# Patient Record
Sex: Female | Born: 1945 | Race: White | Hispanic: No | Marital: Married | Smoking: Former smoker
Health system: Southern US, Community
[De-identification: ages and names within clinical notes are randomized; demographics above are authoritative.]

## PROBLEM LIST (undated history)

## (undated) DIAGNOSIS — I959 Hypotension, unspecified: Secondary | ICD-10-CM

## (undated) DIAGNOSIS — I519 Heart disease, unspecified: Secondary | ICD-10-CM

## (undated) DIAGNOSIS — Z87891 Personal history of nicotine dependence: Secondary | ICD-10-CM

## (undated) DIAGNOSIS — E0590 Thyrotoxicosis, unspecified without thyrotoxic crisis or storm: Secondary | ICD-10-CM

## (undated) DIAGNOSIS — E785 Hyperlipidemia, unspecified: Secondary | ICD-10-CM

## (undated) DIAGNOSIS — M25559 Pain in unspecified hip: Secondary | ICD-10-CM

## (undated) DIAGNOSIS — R55 Syncope and collapse: Secondary | ICD-10-CM

## (undated) DIAGNOSIS — M1990 Unspecified osteoarthritis, unspecified site: Secondary | ICD-10-CM

## (undated) DIAGNOSIS — Z972 Presence of dental prosthetic device (complete) (partial): Secondary | ICD-10-CM

## (undated) DIAGNOSIS — I499 Cardiac arrhythmia, unspecified: Secondary | ICD-10-CM

## (undated) DIAGNOSIS — R002 Palpitations: Secondary | ICD-10-CM

## (undated) DIAGNOSIS — M961 Postlaminectomy syndrome, not elsewhere classified: Secondary | ICD-10-CM

## (undated) DIAGNOSIS — R0600 Dyspnea, unspecified: Secondary | ICD-10-CM

## (undated) DIAGNOSIS — M48062 Spinal stenosis, lumbar region with neurogenic claudication: Secondary | ICD-10-CM

## (undated) DIAGNOSIS — J449 Chronic obstructive pulmonary disease, unspecified: Secondary | ICD-10-CM

## (undated) DIAGNOSIS — C801 Malignant (primary) neoplasm, unspecified: Secondary | ICD-10-CM

## (undated) DIAGNOSIS — M549 Dorsalgia, unspecified: Secondary | ICD-10-CM

## (undated) DIAGNOSIS — G2581 Restless legs syndrome: Secondary | ICD-10-CM

## (undated) DIAGNOSIS — K219 Gastro-esophageal reflux disease without esophagitis: Secondary | ICD-10-CM

## (undated) HISTORY — DX: Hyperlipidemia, unspecified: E78.5

## (undated) HISTORY — DX: Palpitations: R00.2

## (undated) HISTORY — DX: Heart disease, unspecified: I51.9

## (undated) HISTORY — PX: BACK SURGERY: SHX140

## (undated) HISTORY — DX: Syncope and collapse: R55

## (undated) HISTORY — PX: OTHER SURGICAL HISTORY: SHX169

## (undated) HISTORY — PX: ESOPHAGEAL DILATION: SHX303

## (undated) HISTORY — PX: BLADDER SUSPENSION: SHX72

---

## 1999-08-22 ENCOUNTER — Other Ambulatory Visit: Admission: RE | Admit: 1999-08-22 | Discharge: 1999-08-22 | Payer: Self-pay | Admitting: Family Medicine

## 2000-08-25 ENCOUNTER — Other Ambulatory Visit: Admission: RE | Admit: 2000-08-25 | Discharge: 2000-08-25 | Payer: Self-pay | Admitting: Family Medicine

## 2001-09-16 ENCOUNTER — Other Ambulatory Visit: Admission: RE | Admit: 2001-09-16 | Discharge: 2001-09-16 | Payer: Self-pay | Admitting: Family Medicine

## 2003-05-15 ENCOUNTER — Other Ambulatory Visit: Admission: RE | Admit: 2003-05-15 | Discharge: 2003-05-15 | Payer: Self-pay | Admitting: Family Medicine

## 2004-09-18 ENCOUNTER — Ambulatory Visit: Payer: Self-pay | Admitting: Family Medicine

## 2004-11-13 ENCOUNTER — Ambulatory Visit: Payer: Self-pay | Admitting: Family Medicine

## 2004-12-03 ENCOUNTER — Ambulatory Visit: Payer: Self-pay | Admitting: Family Medicine

## 2004-12-18 ENCOUNTER — Ambulatory Visit: Payer: Self-pay | Admitting: Family Medicine

## 2005-01-07 ENCOUNTER — Ambulatory Visit: Payer: Self-pay | Admitting: Family Medicine

## 2005-01-21 ENCOUNTER — Ambulatory Visit: Payer: Self-pay | Admitting: Family Medicine

## 2005-02-26 ENCOUNTER — Ambulatory Visit: Payer: Self-pay | Admitting: Family Medicine

## 2005-03-06 ENCOUNTER — Ambulatory Visit: Payer: Self-pay | Admitting: Family Medicine

## 2005-07-15 ENCOUNTER — Ambulatory Visit: Payer: Self-pay | Admitting: Family Medicine

## 2005-08-08 ENCOUNTER — Ambulatory Visit: Payer: Self-pay | Admitting: Family Medicine

## 2005-08-13 ENCOUNTER — Ambulatory Visit: Payer: Self-pay | Admitting: Family Medicine

## 2005-09-17 ENCOUNTER — Ambulatory Visit: Payer: Self-pay | Admitting: Family Medicine

## 2005-09-22 ENCOUNTER — Ambulatory Visit: Payer: Self-pay | Admitting: Family Medicine

## 2005-09-22 ENCOUNTER — Other Ambulatory Visit: Admission: RE | Admit: 2005-09-22 | Discharge: 2005-09-22 | Payer: Self-pay | Admitting: Family Medicine

## 2005-10-01 ENCOUNTER — Ambulatory Visit: Payer: Self-pay | Admitting: Family Medicine

## 2005-10-30 ENCOUNTER — Ambulatory Visit: Payer: Self-pay | Admitting: Family Medicine

## 2006-01-20 ENCOUNTER — Ambulatory Visit: Payer: Self-pay | Admitting: Family Medicine

## 2009-01-12 ENCOUNTER — Encounter: Admission: RE | Admit: 2009-01-12 | Discharge: 2009-01-12 | Payer: Self-pay | Admitting: Family Medicine

## 2012-12-07 ENCOUNTER — Emergency Department (HOSPITAL_COMMUNITY): Payer: Medicare Other

## 2012-12-07 ENCOUNTER — Encounter (HOSPITAL_COMMUNITY): Payer: Self-pay | Admitting: *Deleted

## 2012-12-07 ENCOUNTER — Observation Stay (HOSPITAL_COMMUNITY)
Admission: EM | Admit: 2012-12-07 | Discharge: 2012-12-08 | Disposition: A | Discharge status: Home / Self Care | Payer: Medicare Other | Attending: Internal Medicine | Admitting: Internal Medicine

## 2012-12-07 DIAGNOSIS — R2689 Other abnormalities of gait and mobility: Secondary | ICD-10-CM | POA: Diagnosis present

## 2012-12-07 DIAGNOSIS — R4701 Aphasia: Principal | ICD-10-CM | POA: Insufficient documentation

## 2012-12-07 DIAGNOSIS — R269 Unspecified abnormalities of gait and mobility: Secondary | ICD-10-CM | POA: Insufficient documentation

## 2012-12-07 DIAGNOSIS — G8929 Other chronic pain: Secondary | ICD-10-CM | POA: Diagnosis present

## 2012-12-07 DIAGNOSIS — R471 Dysarthria and anarthria: Secondary | ICD-10-CM

## 2012-12-07 DIAGNOSIS — R42 Dizziness and giddiness: Secondary | ICD-10-CM | POA: Insufficient documentation

## 2012-12-07 DIAGNOSIS — R930 Abnormal findings on diagnostic imaging of skull and head, not elsewhere classified: Secondary | ICD-10-CM | POA: Insufficient documentation

## 2012-12-07 DIAGNOSIS — G459 Transient cerebral ischemic attack, unspecified: Secondary | ICD-10-CM

## 2012-12-07 DIAGNOSIS — K219 Gastro-esophageal reflux disease without esophagitis: Secondary | ICD-10-CM | POA: Diagnosis present

## 2012-12-07 DIAGNOSIS — G894 Chronic pain syndrome: Secondary | ICD-10-CM | POA: Insufficient documentation

## 2012-12-07 DIAGNOSIS — G2581 Restless legs syndrome: Secondary | ICD-10-CM | POA: Diagnosis present

## 2012-12-07 DIAGNOSIS — E785 Hyperlipidemia, unspecified: Secondary | ICD-10-CM | POA: Insufficient documentation

## 2012-12-07 DIAGNOSIS — M129 Arthropathy, unspecified: Secondary | ICD-10-CM | POA: Insufficient documentation

## 2012-12-07 DIAGNOSIS — E039 Hypothyroidism, unspecified: Secondary | ICD-10-CM | POA: Diagnosis present

## 2012-12-07 HISTORY — DX: Gastro-esophageal reflux disease without esophagitis: K21.9

## 2012-12-07 HISTORY — DX: Pain in unspecified hip: M25.559

## 2012-12-07 HISTORY — DX: Unspecified osteoarthritis, unspecified site: M19.90

## 2012-12-07 HISTORY — DX: Thyrotoxicosis, unspecified without thyrotoxic crisis or storm: E05.90

## 2012-12-07 HISTORY — DX: Restless legs syndrome: G25.81

## 2012-12-07 HISTORY — DX: Dorsalgia, unspecified: M54.9

## 2012-12-07 LAB — COMPREHENSIVE METABOLIC PANEL
ALT: 17 U/L (ref 0–35)
AST: 27 U/L (ref 0–37)
Albumin: 3.7 g/dL (ref 3.5–5.2)
BUN: 23 mg/dL (ref 6–23)
Calcium: 9.3 mg/dL (ref 8.4–10.5)
Creatinine, Ser: 0.57 mg/dL (ref 0.50–1.10)
GFR calc non Af Amer: >90 mL/min (ref >90)
Glucose, Bld: 108 mg/dL — ABNORMAL HIGH (ref 70–99)
Potassium: 4.2 mEq/L (ref 3.5–5.1)
Total Bilirubin: 0.2 mg/dL — ABNORMAL LOW (ref 0.3–1.2)

## 2012-12-07 LAB — POCT I-STAT, CHEM 8
BUN: 24 mg/dL — ABNORMAL HIGH (ref 6–23)
Calcium, Ion: 1.21 mmol/L (ref 1.13–1.30)
Creatinine, Ser: 0.8 mg/dL (ref 0.50–1.10)
Glucose, Bld: 96 mg/dL (ref 70–99)
TCO2: 28 mmol/L (ref 0–100)

## 2012-12-07 LAB — CBC
Hemoglobin: 13.3 g/dL (ref 12.0–15.0)
MCH: 31.7 pg (ref 26.0–34.0)
MCV: 91.2 fL (ref 78.0–100.0)
RBC: 4.19 MIL/uL (ref 3.87–5.11)

## 2012-12-07 LAB — DIFFERENTIAL
Eosinophils Absolute: 0.1 10*3/uL (ref 0.0–0.7)
Eosinophils Relative: 2 % (ref 0–5)
Lymphs Abs: 1.2 10*3/uL (ref 0.7–4.0)
Monocytes Relative: 14 % — ABNORMAL HIGH (ref 3–12)
Neutrophils Relative %: 58 % (ref 43–77)

## 2012-12-07 LAB — HEMOGLOBIN A1C: Hgb A1c MFr Bld: 5.9 % — ABNORMAL HIGH (ref <5.7)

## 2012-12-07 MED ORDER — CYCLOBENZAPRINE HCL 10 MG PO TABS: 10.0000 mg | ORAL_TABLET | Freq: Two times a day (BID) | ORAL | Status: DC | PRN

## 2012-12-07 MED ORDER — GABAPENTIN 100 MG PO CAPS
200.0000 mg | ORAL_CAPSULE | Freq: Three times a day (TID) | ORAL | Status: DC
  Administered 2012-12-07: 200 mg via ORAL
  Filled 2012-12-07 (×2): qty 3

## 2012-12-07 MED ORDER — ENOXAPARIN SODIUM 40 MG/0.4ML ~~LOC~~ SOLN
40.0000 mg | SUBCUTANEOUS | Status: DC
  Administered 2012-12-07: 40 mg via SUBCUTANEOUS
  Filled 2012-12-07 (×2): qty 0.4

## 2012-12-07 MED ORDER — ADULT MULTIVITAMIN W/MINERALS CH
1.0000 | ORAL_TABLET | Freq: Every day | ORAL | Status: DC
  Administered 2012-12-08: 1 via ORAL
  Filled 2012-12-07 (×2): qty 1

## 2012-12-07 MED ORDER — B COMPLEX PO TABS: 2.0000 | ORAL_TABLET | Freq: Every day | ORAL | Status: DC

## 2012-12-07 MED ORDER — GABAPENTIN 100 MG PO CAPS: 100.0000 mg | ORAL_CAPSULE | Freq: Three times a day (TID) | ORAL | Status: DC

## 2012-12-07 MED ORDER — SODIUM CHLORIDE 0.9 % IV SOLN: 250.0000 mL | INTRAVENOUS | Status: DC | PRN

## 2012-12-07 MED ORDER — B COMPLEX-C PO TABS
1.0000 | ORAL_TABLET | Freq: Every day | ORAL | Status: DC
  Administered 2012-12-07: 1 via ORAL
  Filled 2012-12-07 (×3): qty 1

## 2012-12-07 MED ORDER — SODIUM CHLORIDE 0.9 % IJ SOLN
3.0000 mL | Freq: Two times a day (BID) | INTRAMUSCULAR | Status: DC
  Administered 2012-12-07 – 2012-12-08 (×3): 3 mL via INTRAVENOUS

## 2012-12-07 MED ORDER — STUDY - INVESTIGATIONAL DRUG SIMPLE RECORD
600.0000 mg | Status: AC
  Administered 2012-12-07: 600 mg via ORAL
  Filled 2012-12-07: qty 600

## 2012-12-07 MED ORDER — VITAMIN D3 25 MCG (1000 UNIT) PO TABS
1000.0000 [IU] | ORAL_TABLET | Freq: Every day | ORAL | Status: DC
  Administered 2012-12-08: 1000 [IU] via ORAL
  Filled 2012-12-07 (×2): qty 1

## 2012-12-07 MED ORDER — STUDY - INVESTIGATIONAL DRUG SIMPLE RECORD
75.0000 mg | Freq: Every day | Status: DC
  Filled 2012-12-07 (×2): qty 75

## 2012-12-07 MED ORDER — SODIUM CHLORIDE 0.9 % IJ SOLN: 3.0000 mL | INTRAMUSCULAR | Status: DC | PRN

## 2012-12-07 MED ORDER — MELATONIN 3 MG PO CAPS: 3.0000 mg | ORAL_CAPSULE | Freq: Every day | ORAL | Status: DC

## 2012-12-07 MED ORDER — LEVOTHYROXINE SODIUM 50 MCG PO TABS
50.0000 ug | ORAL_TABLET | Freq: Every day | ORAL | Status: DC
  Administered 2012-12-08: 50 ug via ORAL
  Filled 2012-12-07: qty 1

## 2012-12-07 MED ORDER — PANTOPRAZOLE SODIUM 40 MG PO TBEC
80.0000 mg | DELAYED_RELEASE_TABLET | Freq: Every day | ORAL | Status: DC
  Administered 2012-12-08: 80 mg via ORAL
  Filled 2012-12-07: qty 2

## 2012-12-07 MED ORDER — ROPINIROLE HCL 1 MG PO TABS
3.0000 mg | ORAL_TABLET | Freq: Two times a day (BID) | ORAL | Status: DC
  Administered 2012-12-07 – 2012-12-08 (×2): 3 mg via ORAL
  Filled 2012-12-07 (×3): qty 3

## 2012-12-07 MED ORDER — NABUMETONE 500 MG PO TABS
500.0000 mg | ORAL_TABLET | Freq: Two times a day (BID) | ORAL | Status: DC
  Administered 2012-12-07 – 2012-12-08 (×2): 500 mg via ORAL
  Filled 2012-12-07 (×3): qty 1

## 2012-12-07 MED ORDER — ALPHA-LIPOIC ACID 100 MG PO CAPS: 1.0000 | ORAL_CAPSULE | Freq: Every day | ORAL | Status: DC

## 2012-12-07 MED ORDER — ASPIRIN 325 MG PO TABS
325.0000 mg | ORAL_TABLET | Freq: Every day | ORAL | Status: DC
  Administered 2012-12-07: 325 mg via ORAL
  Filled 2012-12-07 (×2): qty 1

## 2012-12-07 MED ORDER — TRAMADOL HCL 50 MG PO TABS
50.0000 mg | ORAL_TABLET | Freq: Every day | ORAL | Status: DC
  Administered 2012-12-07 – 2012-12-08 (×5): 50 mg via ORAL
  Filled 2012-12-07 (×8): qty 1

## 2012-12-07 MED ORDER — HYDROCODONE-ACETAMINOPHEN 5-325 MG PO TABS
1.0000 | ORAL_TABLET | Freq: Four times a day (QID) | ORAL | Status: DC | PRN
  Administered 2012-12-07 (×2): 1 via ORAL
  Filled 2012-12-07 (×2): qty 1

## 2012-12-07 MED ORDER — COQ-10 100 MG PO CAPS: 100.0000 mg | ORAL_CAPSULE | Freq: Every evening | ORAL | Status: DC

## 2012-12-07 MED ORDER — GABAPENTIN 100 MG PO CAPS
100.0000 mg | ORAL_CAPSULE | Freq: Three times a day (TID) | ORAL | Status: DC
  Administered 2012-12-08: 100 mg via ORAL
  Filled 2012-12-07 (×3): qty 1

## 2012-12-07 MED ORDER — FLUTICASONE PROPIONATE 50 MCG/ACT NA SUSP
2.0000 | Freq: Every day | NASAL | Status: DC
  Administered 2012-12-07 – 2012-12-08 (×2): 2 via NASAL
  Filled 2012-12-07: qty 16

## 2012-12-07 MED ORDER — TEMAZEPAM 15 MG PO CAPS
30.0000 mg | ORAL_CAPSULE | Freq: Every day | ORAL | Status: DC
  Administered 2012-12-07: 30 mg via ORAL
  Filled 2012-12-07: qty 2

## 2012-12-07 MED ORDER — BETHANECHOL CHLORIDE 25 MG PO TABS
25.0000 mg | ORAL_TABLET | Freq: Three times a day (TID) | ORAL | Status: DC
  Administered 2012-12-07 – 2012-12-08 (×3): 25 mg via ORAL
  Filled 2012-12-07 (×5): qty 1

## 2012-12-07 NOTE — ED Provider Notes (Signed)
History     CSN: [id number]  Arrival date & time 12/07/12  0827   First MD Initiated Contact with Patient 12/07/12 [phone]      Chief Complaint  Patient presents with  . Aphasia    (Consider location/radiation/quality/duration/timing/severity/associated sxs/prior treatment) HPI Level 5 caveat due to urgent need for intervention Pt brought to the ED via EMS as a code stroke. She reports she woke up this morning feeling dizzy which is not unusual for her. Husband states that she was not slurring her speech at that time, but over the next 20-30 minutes began to have trouble speaking. EMS reports bilateral weak hand grips, but states symptoms improved considerably en route.   Past Medical History  Diagnosis Date  . Hip pain   . Back pain   . GERD (gastroesophageal reflux disease)   . Hyperthyroidism   . Arthritis   . Restless leg syndrome     No past surgical history on file.  No family history on file.  History  Substance Use Topics  . Smoking status: Not on file  . Smokeless tobacco: Not on file  . Alcohol Use: Not on file    OB History   Grav Para Term Preterm Abortions TAB SAB Ect Mult Living                  Review of Systems All other systems reviewed and are negative except as noted in HPI.   Allergies  Review of patient's allergies indicates no known allergies.  Home Medications  No current outpatient prescriptions on file.  BP 104/72  Pulse 90  Temp(Src) 97.7 F (36.5 C) (Oral)  Resp 14  SpO2 98%  Physical Exam  Nursing note and vitals reviewed. Constitutional: She is oriented to person, place, and time. She appears well-developed and well-nourished.  HENT:  Head: Normocephalic and atraumatic.  Eyes: EOM are normal. Pupils are equal, round, and reactive to light.  Neck: Normal range of motion. Neck supple.  Cardiovascular: Normal rate, normal heart sounds and intact distal pulses.   Pulmonary/Chest: Effort normal and breath sounds normal.   Abdominal: Bowel sounds are normal. She exhibits no distension. There is no tenderness.  Musculoskeletal: Normal range of motion. She exhibits no edema and no tenderness.  Neurological: She is alert and oriented to person, place, and time. She has normal strength. No cranial nerve deficit or sensory deficit.  Mild dysarthria. For full NIHSS, please see Code Stroke team notes  Skin: Skin is warm and dry. No rash noted.  Psychiatric: She has a normal mood and affect.    ED Course  Procedures (including critical care time)  Labs Reviewed  DIFFERENTIAL - Abnormal; Notable for the following:    Monocytes Relative 14 (*)    All other components within normal limits  COMPREHENSIVE METABOLIC PANEL - Abnormal; Notable for the following:    Glucose, Bld 108 (*)    Total Bilirubin 0.2 (*)    All other components within normal limits  POCT I-STAT, CHEM 8 - Abnormal; Notable for the following:    BUN 24 (*)    All other components within normal limits  PROTIME-INR  APTT  CBC  TROPONIN I   Ct Head Wo Contrast  12/07/2012  *RADIOLOGY REPORT*  Clinical Data: Code stroke  CT HEAD WITHOUT CONTRAST  Technique:  Contiguous axial images were obtained from the base of the skull through the vertex without contrast.  Comparison: None.  Findings: No skull fracture is noted.  Paranasal  sinuses and mastoid air cells are unremarkable.  No intracranial hemorrhage, mass effect or midline shift.  No acute infarction.  No mass lesion is noted on this unenhanced scan. No hydrocephalus.  IMPRESSION: No acute intracranial abnormality.  No definite acute infarction.   Original Report Authenticated By: Natasha Mead, M.D.      No diagnosis found.    MDM   Date: 12/07/2012  Rate: 90  Rhythm: normal sinus rhythm  QRS Axis: normal  Intervals: normal  ST/T Wave abnormalities: normal  Conduction Disutrbances: none  Narrative Interpretation: unremarkable  Pt evaluated by Dr. [name] with Neurology who does not feel  she is a tPA candidate due to low NIHSS and resolving symptoms. Will admit for TIA evaluation.           Charles B. Bernette Mayers, MD 12/07/12 [id number]

## 2012-12-07 NOTE — H&P (Signed)
Hospital Admission Note Date: 12/07/2012  Patient name: [name] Medical record number: [id number] Date of birth: October 24, 1946 Age: 67 y.o. Gender: female PCP: Dina Rich, MD  Medical Service: Internal medicine teaching service  Attending physician: Dr. Josem Kaufmann    1st Contact: Collier Bullock    Pager: [phone] 2nd Contact: Manson Passey    Pager: [phone] After 5 pm or weekends: 1st Contact:      Pager: [phone] 2nd Contact:      Pager: [phone]  Chief Complaint: Weakness and slurred speech.  History of Present Illness: Patient is a pleasant 26 year woman with past history of GERD, restless leg syndrome, arthritis who was brought to the ED for code stroke. She was seen normal last at 6:50 AM. Around that time she started feeling wobbly and weak when walking. She had to sit down and had trouble doing that too. Her husband noticed slurred speech along with expressive aphasia during this episode. He called 911- who came in noticed weakness also. While en route to ED, her weakness got better. She denies any residual weakness, sensation changes, headache, palpitations, vision changes now. She also denies nausea, vomiting, abdominal pain, chest pain, shortness of breath, diarrhea.  She does not have a previous history of CVA, diabetes, hypertension, hyper lipidemia.  Meds: Current Outpatient Rx  Name  Route  Sig  Dispense  Refill  . Alpha-Lipoic Acid 100 MG CAPS   Oral   Take 1 capsule by mouth daily.         Marland Kitchen aspirin EC 81 MG tablet   Oral   Take 81 mg by mouth every evening.         Marland Kitchen b complex vitamins tablet   Oral   Take 2 tablets by mouth daily.         . bethanechol (URECHOLINE) 25 MG tablet   Oral   Take 25 mg by mouth 3 (three) times daily.         . cholecalciferol (VITAMIN D) 1000 UNITS tablet   Oral   Take 1,000 Units by mouth daily.         . Coenzyme Q10 (COQ-10) 100 MG CAPS   Oral   Take 100 mg by mouth every evening.         . cyclobenzaprine (FLEXERIL) 10 MG  tablet   Oral   Take 10 mg by mouth 2 (two) times daily as needed for muscle spasms (for muscle spasams).         . fluticasone (FLONASE) 50 MCG/ACT nasal spray   Nasal   Place 2 sprays into the nose daily.         Marland Kitchen gabapentin (NEURONTIN) 100 MG capsule   Oral   Take 200-300 mg by mouth 3 (three) times daily. 200 mg every morning and at noon, and 300 mg every evening         . HYDROcodone-acetaminophen (NORCO/VICODIN) 5-325 MG per tablet   Oral   Take 1 tablet by mouth every 6 (six) hours as needed for pain (for pain).         Marland Kitchen levothyroxine (SYNTHROID, LEVOTHROID) 50 MCG tablet   Oral   Take 50 mcg by mouth daily.         . Melatonin 3 MG CAPS   Oral   Take 3 mg by mouth at bedtime.         . Multiple Vitamin (MULTIVITAMIN WITH MINERALS) TABS   Oral   Take 1 tablet by mouth daily.         Marland Kitchen  nabumetone (RELAFEN) 500 MG tablet   Oral   Take 500 mg by mouth 2 (two) times daily.         Marland Kitchen omeprazole (PRILOSEC) 40 MG capsule   Oral   Take 40 mg by mouth every evening.         Marland Kitchen rOPINIRole (REQUIP) 1 MG tablet   Oral   Take 3 mg by mouth 2 (two) times daily.         . temazepam (RESTORIL) 30 MG capsule   Oral   Take 30 mg by mouth at bedtime.         . traMADol (ULTRAM) 50 MG tablet   Oral   Take 50 mg by mouth 5 (five) times daily.           Allergies: Allergies as of 12/07/2012  . (No Known Allergies)   Past Medical History  Diagnosis Date  . Hip pain   . Back pain   . GERD (gastroesophageal reflux disease)   . Hyperthyroidism   . Arthritis   . Restless leg syndrome    History reviewed. No pertinent past surgical history. No family history on file. History   Social History  . Marital Status: Married    Spouse Name: N/A    Number of Children: N/A  . Years of Education: N/A   Occupational History  . Not on file.   Social History Main Topics  . Smoking status: Former Smoker    Types: Cigarettes  . Smokeless tobacco:  Not on file     Comment: Quit about 20 years before.  . Alcohol Use: No  . Drug Use: No  . Sexually Active: Not on file   Other Topics Concern  . Not on file   Social History Narrative   Patient lives with her husband.    She is an adopted kid and so is unaware of her family history.   She has 2 children- one has asthma and other has arthritis.          Review of Systems: As per history of present illness, all other systems reviewed and negative.  Physical Exam: Blood pressure 105/65, pulse 90, temperature 98.2 F (36.8 C), temperature source Oral, resp. rate 18, SpO2 100.00%. Constitutional: Vital signs reviewed.  Patient is a well-developed and well-nourished in no acute distress and cooperative with exam. Alert and oriented x3.  Head: Normocephalic and atraumatic Mouth: no erythema or exudates, MMM Eyes: PERRL, EOMI, conjunctivae normal, No scleral icterus.  Neck: Supple, Trachea midline normal ROM, No JVD Cardiovascular: RRR, S1 normal, S2 normal, no MRG, pulses symmetric and intact bilaterally Pulmonary/Chest: CTAB, no wheezes, rales, or rhonchi Abdominal: Soft. Non-tender, non-distended, bowel sounds are normal, no masses, organomegaly, or guarding present.  Musculoskeletal: No joint deformities, erythema, or stiffness, ROM full and no nontender Neurological: A&O x3, Strength is normal and symmetric bilaterally, cranial nerve II-XII are grossly intact, no focal motor deficit, sensory intact to light touch bilaterally.  Skin: Warm, dry and intact. No rash, cyanosis, or clubbing.  Psychiatric: Normal mood and affect. speech and behavior is normal. Judgment and thought content normal. Cognition and memory are normal.     Lab results: Basic Metabolic Panel:  Recent Labs  40/98/11 0841 12/07/12 0848  NA 139 137  K 4.3 4.2  CL 103 101  CO2  --  28  GLUCOSE 96 108*  BUN 24* 23  CREATININE 0.80 0.57  CALCIUM  --  9.3   Liver Function Tests:  Recent Labs   12/07/12 0848  AST 27  ALT 17  ALKPHOS 54  BILITOT 0.2*  PROT 6.8  ALBUMIN 3.7   CBC:  Recent Labs  12/07/12 0841 12/07/12 0848  WBC  --  4.6  NEUTROABS  --  2.7  HGB 13.9 13.3  HCT 41.0 38.2  MCV  --  91.2  PLT  --  266   Cardiac Enzymes:  Recent Labs  12/07/12 0848  TROPONINI <0.30   Coagulation:  Recent Labs  12/07/12 0848  LABPROT 12.4  INR 0.93    Imaging results:  Ct Head Wo Contrast  12/07/2012  *RADIOLOGY REPORT*  Clinical Data: Code stroke  CT HEAD WITHOUT CONTRAST  Technique:  Contiguous axial images were obtained from the base of the skull through the vertex without contrast.  Comparison: None.  Findings: No skull fracture is noted.  Paranasal sinuses and mastoid air cells are unremarkable.  No intracranial hemorrhage, mass effect or midline shift.  No acute infarction.  No mass lesion is noted on this unenhanced scan. No hydrocephalus.  IMPRESSION: No acute intracranial abnormality.  No definite acute infarction.   Original Report Authenticated By: Natasha Mead, M.D.     Other results: EKG: Normal sinus rhythm without any ST, T-wave abnormalities concerning for ischemia/infarction.  Assessment & Plan by Problem: Principal Problem:   TIA (transient ischemic attack) Active Problems:   Hypothyroidism   Restless leg syndrome   GERD (gastroesophageal reflux disease)   Chronic pain  1) TIA: Patient with symptoms of CVA with quick resolution consistent with TIA. He did negative for acute stroke/bleed. Apparently patient does not have classic risk factors for CVA including diabetes, hypertension hyperlipidemia, cigarette smoking. She stopped smoking about 20 years before.  The only recent change was about her Neurontin.  CT head negative for acute changes. Code stroke was called and neuro consultation was provided by Dr. [name] in ED. No rhythm abnormalities on EKG or telemetry so far. No previous history of arrhythmias.  - Admit to observation on  telemetry for TIA workup. - MRI/MRA head without contrast, 2-D echo and carotid Dopplers.  - Risk stratification - HbA1c and fasting lipid panel. - Aspirin 325 mg daily and then can change to 81 milligram- patient was already on baby aspirin daily. - PT/OT/SLP consult.   2) Restless leg syndrome : Continue home medication including Requip and Relafen.  3) Chronic pain : Likely from arthritis. Continue home Vicodin and tramadol.  4) DVT prophylaxis: Lovenox.  Dispo: Disposition is deferred at this time, awaiting improvement of current medical problems. Anticipated discharge in approximately 1-2 day(s).   The patient does have a current PCP (DOUGH,ROBERT, MD), therefore will not be requiring OPC follow-up after discharge.   The patient does not have transportation limitations that hinder transportation to clinic appointments.  Signed: Sorin Frimpong 12/07/2012, 11:02 AM

## 2012-12-07 NOTE — ED Notes (Signed)
Pt returned from MRI °

## 2012-12-07 NOTE — Progress Notes (Signed)
  Echocardiogram 2D Echocardiogram has been performed.  Jorje Guild 12/07/2012, 4:27 PM

## 2012-12-07 NOTE — Progress Notes (Signed)
PHARMACIST - PHYSICIAN ORDER COMMUNICATION  CONCERNING: P&T Medication Policy on Herbal Medications  DESCRIPTION:  This patient's order for:  Alpha-Lipoic Acid, Melatonin, Co-enzyme Q10  has been noted.  This product(s) is classified as an "herbal" or natural product. Due to a lack of definitive safety studies or FDA approval, nonstandard manufacturing practices, plus the potential risk of unknown drug-drug interactions while on inpatient medications, the Pharmacy and Therapeutics Committee does not permit the use of "herbal" or natural products of this type within [facility].   ACTION TAKEN: The pharmacy department is unable to verify this order at this time and your patient has been informed of this safety policy. Please reevaluate patient's clinical condition at discharge and address if the herbal or natural product(s) should be resumed at that time.  Thank you, Harland German, Pharm D 12/07/2012 2:32 PM

## 2012-12-07 NOTE — Research (Signed)
Patient was admitted to hospital for possible TIA. During evaluation, patient was identified as a possible POINT trial candidate. A POINT trial informed consent was given to patient to read and ask questions. Patient requested to contact PCP, Dr. Sol Passer, for trial approval. Dr. Sol Passer was contacted and spoke to patient via telephone. After reading the informed consent and speaking with Dr. Sol Passer, patient agreed to participate. No study related activities were performed prior to the consenting process. Signatures were obtained on the informed consent and a copy was given to patient for personal record. Patient met the inclusion/exclusion criteria and randomized to kit# [id number]. Patient was given 8 tabs of study drug at 13:40.

## 2012-12-07 NOTE — Consult Note (Signed)
Referring Physician: Dr. Bernette Mayers    Chief Complaint: Slurred speech and dizziness as well as weakness.  HPI: [name] is an 67 y.o. female history of arthritis, back pain, restless leg syndrome and GERD, presenting with slurred speech and dizziness as well as a feeling of weakness bilaterally. Patient was last known normal at 6:50 AM today. Has no previous history of stroke nor TIA. She's been taking aspirin daily. CT scan of her head showed no acute intracranial abnormality. Dysarthria improved after arriving in the emergency room. Weakness resolved. Dizziness improved. NIH stroke score initially was 3 and subsequently was 1 for minimal residual dysarthria.  LSN:  6:50 AM today tPA Given: No: Rapidly resolving deficits MRankin: 0  Past Medical History  Diagnosis Date  . Hip pain   . Back pain   . GERD (gastroesophageal reflux disease)   . Hyperthyroidism   . Arthritis   . Restless leg syndrome     No family history on file.   Medications: Prior to Admission:  (Not in a hospital admission)   Physical Examination: Blood pressure 127/60, pulse 90, temperature 98.2 F (36.8 C), temperature source Oral, resp. rate 20, SpO2 100.00%.  Neurologic Examination: Mental Status: Alert, oriented, thought content appropriate.  Speech fluent without evidence of aphasia. Able to follow commands without difficulty. Cranial Nerves: II-Visual fields were normal. III/IV/VI-Pupils were equal and reacted. Extraocular movements were full and conjugate.    V/VII-no facial numbness and no facial weakness. VIII-normal. X-minimal slurring of speech. XII-midline tongue extension Motor: 5/5 bilaterally with normal tone and bulk Sensory: Normal throughout. Deep Tendon Reflexes: 2+ and symmetric. Plantars: Flexor bilaterally Cerebellar: Normal finger-to-nose testing. Carotid auscultation: Normal  Ct Head Wo Contrast  12/07/2012  *RADIOLOGY REPORT*  Clinical Data: Code stroke  CT HEAD WITHOUT  CONTRAST  Technique:  Contiguous axial images were obtained from the base of the skull through the vertex without contrast.  Comparison: None.  Findings: No skull fracture is noted.  Paranasal sinuses and mastoid air cells are unremarkable.  No intracranial hemorrhage, mass effect or midline shift.  No acute infarction.  No mass lesion is noted on this unenhanced scan. No hydrocephalus.  IMPRESSION: No acute intracranial abnormality.  No definite acute infarction.   Original Report Authenticated By: Natasha Mead, M.D.     Assessment: TIA or possibly small vessel ischemic infarction, as etiology for patient's slurred speech and dizziness, cannot be ruled out.   Stroke Risk Factors - none  Plan: 1. HgbA1c, fasting lipid panel 2. MRI, MRA  of the brain without contrast 3. PT consult, OT consult, Speech consult 4. Echocardiogram 5. Carotid dopplers 6. Prophylactic therapy-Antiplatelet med: Aspirin 81 mg per day 7. Risk factor modification 8. Telemetry monitoring   C.R. [name], MD [facility] [phone]   12/07/2012, 10:04 AM

## 2012-12-07 NOTE — ED Notes (Signed)
Pts husband was leaving for work this am and noticed that she had slurred speech and bilateral upper extremity weakness.  Last seen normal 2200 on 2/10.  Pt arrives by [employer].  Alert and oriented on arrival

## 2012-12-07 NOTE — Progress Notes (Signed)
Utilization review completed.  P.J. Lynnette Pote,RN,BSN Case Manager [phone]  

## 2012-12-07 NOTE — ED Notes (Signed)
Initial NIHSS was done by Hella RN and was 3 (pt missed questions on age and month).  Repeat NIHSS was 1, pt was alert and oriented, one point for disarthria

## 2012-12-08 DIAGNOSIS — R2689 Other abnormalities of gait and mobility: Secondary | ICD-10-CM | POA: Diagnosis present

## 2012-12-08 DIAGNOSIS — R269 Unspecified abnormalities of gait and mobility: Secondary | ICD-10-CM

## 2012-12-08 DIAGNOSIS — G2581 Restless legs syndrome: Secondary | ICD-10-CM

## 2012-12-08 LAB — CBC
MCHC: 34.6 g/dL (ref 30.0–36.0)
Platelets: 280 10*3/uL (ref 150–400)
RDW: 13.4 % (ref 11.5–15.5)

## 2012-12-08 LAB — BASIC METABOLIC PANEL
BUN: 14 mg/dL (ref 6–23)
Calcium: 9 mg/dL (ref 8.4–10.5)
Creatinine, Ser: 0.51 mg/dL (ref 0.50–1.10)
GFR calc Af Amer: >90 mL/min (ref >90)
GFR calc non Af Amer: >90 mL/min (ref >90)

## 2012-12-08 LAB — LIPID PANEL
Cholesterol: 274 mg/dL — ABNORMAL HIGH (ref 0–200)
LDL Cholesterol: 183 mg/dL — ABNORMAL HIGH (ref 0–99)
Triglycerides: 200 mg/dL — ABNORMAL HIGH (ref <150)

## 2012-12-08 LAB — RAPID URINE DRUG SCREEN, HOSP PERFORMED
Amphetamines: NOT DETECTED
Barbiturates: NOT DETECTED
Benzodiazepines: NOT DETECTED

## 2012-12-08 MED ORDER — GABAPENTIN 100 MG PO CAPS: 100.0000 mg | ORAL_CAPSULE | Freq: Three times a day (TID) | ORAL | Status: DC

## 2012-12-08 MED ORDER — SIMVASTATIN 20 MG PO TABS
20.0000 mg | ORAL_TABLET | Freq: Every day | ORAL | Status: DC
  Filled 2012-12-08: qty 1

## 2012-12-08 MED ORDER — SIMVASTATIN 20 MG PO TABS: 20.0000 mg | ORAL_TABLET | Freq: Every day | ORAL | Status: DC

## 2012-12-08 MED ORDER — ASPIRIN 325 MG PO TABS: 325.0000 mg | ORAL_TABLET | Freq: Every day | ORAL | Status: DC

## 2012-12-08 NOTE — Progress Notes (Signed)
PT Cancellation Note  Patient Details Name: [name] MRN: [id number] DOB: 03-04-1946   Cancelled Treatment:    Reason Eval/Treat Not Completed:  Spoke with OT; pt does not require skilled PT at this time. Pt completed stair negotiation, transfers, and ambulation safely and independently. Pt with good support at home. PT eval not completed due to not needed. Please re-consult if skilled PT services needed in future.   Marcene Brawn 12/08/2012, 11:08 AM  Lewis Shock, PT, DPT Pager #: [phone] Office #: [phone]

## 2012-12-08 NOTE — Progress Notes (Signed)
Stroke Team Progress Note  HISTORY [name] is an 67 y.o. female history of arthritis, back pain, restless leg syndrome and GERD, presenting with slurred speech and dizziness as well as a feeling of weakness bilaterally. Patient was last known normal at 6:50 AM today 12/07/2012. Has no previous history of stroke nor TIA. She's been taking aspirin daily. CT scan of her head showed no acute intracranial abnormality. Dysarthria improved after arriving in the emergency room. Weakness resolved. Dizziness improved. NIH stroke score initially was 3 and subsequently was 1 for minimal residual dysarthria. Patient was not a TPA candidate secondary to rapidly resolving symptoms. She was admitted for further evaluation and treatment.  SUBJECTIVE Her husband is at the bedside. She does not have any memory of her episode yesterday. Overall she feels her condition is rapidly improving. He reports her staggering, both to right and left yesterday. She got real sleepy and went out. Recent increase in gabapentin 2-2-3 two weeks ago. Did not mix up dose. Did not take 3 yesterday am. No other changes in medication for the past 2 weeks. She is sure. She had no facial droop. She did have slurred speech. No loss of bladder/bowel function.  OBJECTIVE Most recent Vital Signs: Filed Vitals:   12/07/12 1819 12/07/12 2147 12/08/12 0200 12/08/12 0600  BP: 113/70 125/68 108/50 101/51  Pulse: 88 94 84 84  Temp: 97.5 F (36.4 C) 97.9 F (36.6 C) 97.6 F (36.4 C) 97.6 F (36.4 C)  TempSrc: Oral     Resp: 18 16 16 16   Height:      Weight:      SpO2: 100% 100% 100% 99%   CBG (last 3)  No results found for this basename: GLUCAP,  in the last 72 hours  IV Fluid Intake:     MEDICATIONS  . aspirin  325 mg Oral Daily  . B-complex with vitamin C  1 tablet Oral Daily  . bethanechol  25 mg Oral TID  . cholecalciferol  1,000 Units Oral Daily  . enoxaparin (LOVENOX) injection  40 mg Subcutaneous Q24H  . fluticasone  2 spray  Each Nare Daily  . gabapentin  100 mg Oral TID  . levothyroxine  50 mcg Oral Daily  . multivitamin with minerals  1 tablet Oral Daily  . nabumetone  500 mg Oral BID  . pantoprazole  80 mg Oral Daily  . rOPINIRole  3 mg Oral BID  . sodium chloride  3 mL Intravenous Q12H  . temazepam  30 mg Oral QHS  . traMADol  50 mg Oral 5 X Daily   PRN:  sodium chloride, cyclobenzaprine, HYDROcodone-acetaminophen, sodium chloride  Diet:  General thin liquids Activity:   Bathroom privileges with assistance DVT Prophylaxis:  Lovenox 40 mg sq daily   CLINICALLY SIGNIFICANT STUDIES Basic Metabolic Panel:  Recent Labs Lab 12/07/12 0848 12/08/12 0650  NA 137 134*  K 4.2 3.8  CL 101 98  CO2 28 26  GLUCOSE 108* 92  BUN 23 14  CREATININE 0.57 0.51  CALCIUM 9.3 9.0   Liver Function Tests:  Recent Labs Lab 12/07/12 0848  AST 27  ALT 17  ALKPHOS 54  BILITOT 0.2*  PROT 6.8  ALBUMIN 3.7   CBC:  Recent Labs Lab 12/07/12 0841 12/07/12 0848 12/08/12 0650  WBC  --  4.6 5.0  NEUTROABS  --  2.7  --   HGB 13.9 13.3 12.6  HCT 41.0 38.2 36.4  MCV  --  91.2 89.7  PLT  --  266 280   Coagulation:  Recent Labs Lab 12/07/12 0848  LABPROT 12.4  INR 0.93   Cardiac Enzymes:  Recent Labs Lab 12/07/12 0848  TROPONINI <0.30   Urinalysis: No results found for this basename: COLORURINE, APPERANCEUR, LABSPEC, PHURINE, GLUCOSEU, HGBUR, BILIRUBINUR, KETONESUR, PROTEINUR, UROBILINOGEN, NITRITE, LEUKOCYTESUR,  in the last 168 hours Lipid Panel    Component Value Date/Time   CHOL 274* 12/08/2012 0650   TRIG 200* 12/08/2012 0650   HDL 51 12/08/2012 0650   CHOLHDL 5.4 12/08/2012 0650   VLDL 40 12/08/2012 0650   LDLCALC 183* 12/08/2012 0650   HgbA1C  Lab Results  Component Value Date   HGBA1C 5.9* 12/07/2012    Urine Drug Screen:   No results found for this basename: labopia, cocainscrnur, labbenz, amphetmu, thcu, labbarb    Alcohol Level: No results found for this basename: ETH,  in the last 168  hours  CT of the brain  12/07/2012  : No acute intracranial abnormality.  No definite acute infarction.     MRI of the brain  12/07/2012   1. No acute intracranial abnormality. 2.  Mild for age nonspecific cerebral white matter signal changes.  MRA of the brain  12/07/2012   Negative for age intracranial MRA.  2D Echocardiogram    Carotid Doppler   Preliminary report: Bilateral: No evidence of hemodynamically significant internal carotid artery stenosis. Vertebral artery flow is antegrade.    CXR    EKG  normal sinus rhythm.   Therapy Recommendations   Physical Exam   The patient is alert and cooperative at the time of examination. Neck is supple, no carotid bruits are noted Cardiovascular examination reveals a regular rate and rhythm, no obvious murmurs or rubs are noted. Extremities are without significant edema.  Neurologic examination: Extraocular movements are full. Visual fields are full. Speech is normal, no aphasia or dysarthria is noted. Pinprick sensation on the face is symmetric and normal. Motor examination reveals full strength of all 4 extremities. Good symmetric motor tone is noted throughout. Sensation is intact to pinprick, soft touch, and vibration sensation throughout. No evidence of extinction is noted. Cerebellar testing reveals good finger-nose-finger and heel-to-shin bilaterally. Gait was not tested. Deep tendon reflexes are symmetric and normal. Toes are downgoing bilaterally.   ASSESSMENT [name] is a 67 y.o. female presenting with slurred speech and dizziness as well as a feeling of weakness bilaterally. She has no memory of the event. Imaging confirms no acute  infarct. Dx: sounds like a medication overdose based on physilcal sx history - though pt denies any change in medication regimen. Doubt TIA as memory loss not typically associated with TIA/stroke, but will complete workup . On aspirin 81 mg orally every day prior to admission. Now on aspirin  325 mg orally every day for secondary stroke prevention. Patient with no resultant neuor symptoms this am. Work up underway.  Patient was enrollled in POINT Trial - POINT is a randomized, double-blind, multicenter clinical trial to determine whether clopidogrel 75mg /day (after a loading dose of 600mg ) is effective in improving survival free from major ischemic vascular events (ischemic stroke, myocardial infarction, and ischemic vascular death) at 90 days when initiated within 12 hours time last known free of new ischemic symptoms of TIA or minor ischemic stroke in subjects receiving aspirin 50-325mg /day. Please contact Lorenza Burton at [facility] at [phone] for any questions. - she was taken on POINT study drug this am as POINT study drug contraindicated in the  setting of requip. Patient not willing to stop requip at this time. RLS on requip No history of migraine. Mild headache past few weeks. Hyperlipidemia, LDL 186, not on statin PTA (has been taken off statin in the past - she is not sure why), not on statin now, goal LDL < 100 HgbA1c 5.9 Hip and back pain GERD Hyperthyroidism Arthritis  Hospital day # 1  The patient gives a history that could be consistent with a medication overdose. The patient was dizzy, and she was amnestic for the event. The patient was noted to be ataxic, with dysarthria. The patient however, clearly indicates that she has had no alteration in her medication dosing for over 2 weeks. The patient will undergo EEG evaluation to exclude seizures. Stroke workup is ongoing. MRI is notable for no acute changes. Some mild chronic cerebrovascular disease is noted.   TREATMENT/PLAN Recommendations  Continue aspirin 325 mg orally every day for secondary stroke prevention.  F/u 2D  check EEG  Add statin  Dr. Anne Hahn discussed with Dr. Carleene Mains, MSN, RN, ANVP-BC, ANP-BC, GNP-BC [facility] Pager: [phone] 12/08/2012  9:39 AM  I have personally obtained a history, examined the patient, evaluated imaging results, and formulated the assessment and plan of care. I agree with the above.   Lesly Dukes

## 2012-12-08 NOTE — Progress Notes (Signed)
Subjective:  On further history, patient states that she has been taking gabapentin 100mg  tabs for the past month with recently increased dose. Patient took her medicines around 6am the day of admission, less than an hour before patient was found to have altered mental status. Patient admits she had been symptomatic with dizziness from the gabapentin previously.  Patient does not remember anything from yesterday morning until after she had been in the ED.  She has been back to baseline mental status without any residual neurologic deficits since admission.  Denies weakness, numbness, slurred speech, dizziness.  Spoke with husband who voiced concern about patient's medications and whether she was taking the correct doses. She keeps her pills in a pill box and sometimes doesn't check how many or which meds are in the container before taking. Worried about potential medication side effects.  Objective: Vital signs in last 24 hours: Filed Vitals:   12/07/12 1819 12/07/12 2147 12/08/12 0200 12/08/12 0600  BP: 113/70 125/68 108/50 101/51  Pulse: 88 94 84 84  Temp: 97.5 F (36.4 C) 97.9 F (36.6 C) 97.6 F (36.4 C) 97.6 F (36.4 C)  TempSrc: Oral     Resp: 18 16 16 16   Height:      Weight:      SpO2: 100% 100% 100% 99%   Weight change:   Intake/Output Summary (Last 24 hours) at 12/08/12 0831 Last data filed at 12/07/12 2130  Gross per 24 hour  Intake    120 ml  Output      0 ml  Net    120 ml    Physical Exam Blood pressure 101/51, pulse 84, temperature 97.6 F (36.4 C), temperature source Oral, resp. rate 16, height 5' (1.524 m), weight 111 lb 15.9 oz (50.8 kg), SpO2 99.00%. General:  No acute distress, alert and oriented x 3, well-appearing  HEENT:  PERRL, EOMI, moist mucous membranes Cardiovascular:  Regular rate and rhythm, no murmurs, rubs or gallops Respiratory:  Clear to auscultation bilaterally, no wheezes, rales, or rhonchi Abdomen:  Soft, nondistended, nontender,  bowel sounds present Extremities:  Warm and well-perfused, no edema.  Skin: Warm, dry, no rashes Neuro: Cranial nerves intact, strength 5/5 throughout, sensation intact, symmetric reflexes.  Lab Results: Basic Metabolic Panel:  Recent Labs Lab 12/07/12 0848 12/08/12 0650  NA 137 134*  K 4.2 3.8  CL 101 98  CO2 28 26  GLUCOSE 108* 92  BUN 23 14  CREATININE 0.57 0.51  CALCIUM 9.3 9.0   Liver Function Tests:  Recent Labs Lab 12/07/12 0848  AST 27  ALT 17  ALKPHOS 54  BILITOT 0.2*  PROT 6.8  ALBUMIN 3.7   CBC:  Recent Labs Lab 12/07/12 0841 12/07/12 0848 12/08/12 0650  WBC  --  4.6 5.0  NEUTROABS  --  2.7  --   HGB 13.9 13.3 12.6  HCT 41.0 38.2 36.4  MCV  --  91.2 89.7  PLT  --  266 280   Cardiac Enzymes:  Recent Labs Lab 12/07/12 0848  TROPONINI <0.30   Hemoglobin A1C:  Recent Labs Lab 12/07/12 1617  HGBA1C 5.9*   Fasting Lipid Panel:  Recent Labs Lab 12/08/12 0650  CHOL 274*  HDL 51  LDLCALC 183*  TRIG 200*  CHOLHDL 5.4   Coagulation:  Recent Labs Lab 12/07/12 0848  LABPROT 12.4  INR 0.93    Studies/Results: Ct Head Wo Contrast  12/07/2012  *RADIOLOGY REPORT*  Clinical Data: Code stroke  CT HEAD WITHOUT CONTRAST  Technique:  Contiguous axial images were obtained from the base of the skull through the vertex without contrast.  Comparison: None.  Findings: No skull fracture is noted.  Paranasal sinuses and mastoid air cells are unremarkable.  No intracranial hemorrhage, mass effect or midline shift.  No acute infarction.  No mass lesion is noted on this unenhanced scan. No hydrocephalus.  IMPRESSION: No acute intracranial abnormality.  No definite acute infarction.   Original Report Authenticated By: Natasha Mead, M.D.    Mr Angiogram Head Wo Contrast  12/07/2012  *RADIOLOGY REPORT*  Clinical Data:  67 year old female with headache and expressive aphasia. Transient dizziness.  Comparison: Head CT without contrast the same day.  MRI HEAD  WITHOUT CONTRAST  Technique: Multiplanar, multiecho pulse sequences of the brain and surrounding structures were obtained according to standard protocol without intravenous contrast.  Findings: Cerebral volume is within normal limits for age.  No restricted diffusion to suggest acute infarction.  No midline shift, mass effect, evidence of mass lesion, ventriculomegaly, extra-axial collection or acute intracranial hemorrhage. Cervicomedullary junction and pituitary are within normal limits. Major intracranial vascular flow voids are preserved, dominant distal left vertebral artery.  Scattered periventricular and subcortical white matter T2 and FLAIR hyperintensity.  This is most pronounced in the right periatrial region.  Extent is overall mild for age.  No cortical encephalomalacia.  Deep gray matter nuclei, brainstem, cerebellum, and visualized cervical spine are within normal limits.  Visualized bone marrow signal is within normal limits.  Visualized orbit soft tissues are within normal limits.  Visualized paranasal sinuses and mastoids are clear.  Grossly normal visualized internal auditory structures.  Negative scalp soft tissues.  IMPRESSION: 1. No acute intracranial abnormality. 2.  Mild for age nonspecific cerebral white matter signal changes. 3.  Intracranial MRA findings are below.  MRA HEAD WITHOUT CONTRAST  Technique: Angiographic images of the Circle of Willis were obtained using MRA technique without  intravenous contrast.  Findings: Antegrade flow in the posterior circulation.  Dominant distal left vertebral artery.  Diminutive right vertebral artery functionally terminates in PICA.  Normal left PICA.  Mild tortuosity of the basilar artery without stenosis.  Right AICA origins are normal.  Small posterior communicating arteries.  SCA and PCA origins are within normal limits.  Bilateral PCA branches are within normal limits.  Antegrade flow in both ICA siphons.  Tortuous distal cervical right ICA.  Mild  ICA siphon irregularity without stenosis.  Ophthalmic and posterior communicating artery origins are within normal limits.  Normal carotid termini, MCA and ACA origins.  Mildly dominant right ACA A1 segment.  Normal anterior communicating artery.  Visualized bilateral MCA and ACA branches are within normal limits.  IMPRESSION: Negative for age intracranial MRA.   Original Report Authenticated By: Erskine Speed, M.D.    Mr Brain Wo Contrast  12/07/2012  *RADIOLOGY REPORT*  Clinical Data:  67 year old female with headache and expressive aphasia. Transient dizziness.  Comparison: Head CT without contrast the same day.  MRI HEAD WITHOUT CONTRAST  Technique: Multiplanar, multiecho pulse sequences of the brain and surrounding structures were obtained according to standard protocol without intravenous contrast.  Findings: Cerebral volume is within normal limits for age.  No restricted diffusion to suggest acute infarction.  No midline shift, mass effect, evidence of mass lesion, ventriculomegaly, extra-axial collection or acute intracranial hemorrhage. Cervicomedullary junction and pituitary are within normal limits. Major intracranial vascular flow voids are preserved, dominant distal left vertebral artery.  Scattered periventricular and subcortical white matter T2 and  FLAIR hyperintensity.  This is most pronounced in the right periatrial region.  Extent is overall mild for age.  No cortical encephalomalacia.  Deep gray matter nuclei, brainstem, cerebellum, and visualized cervical spine are within normal limits.  Visualized bone marrow signal is within normal limits.  Visualized orbit soft tissues are within normal limits.  Visualized paranasal sinuses and mastoids are clear.  Grossly normal visualized internal auditory structures.  Negative scalp soft tissues.  IMPRESSION: 1. No acute intracranial abnormality. 2.  Mild for age nonspecific cerebral white matter signal changes. 3.  Intracranial MRA findings are below.   MRA HEAD WITHOUT CONTRAST  Technique: Angiographic images of the Circle of Willis were obtained using MRA technique without  intravenous contrast.  Findings: Antegrade flow in the posterior circulation.  Dominant distal left vertebral artery.  Diminutive right vertebral artery functionally terminates in PICA.  Normal left PICA.  Mild tortuosity of the basilar artery without stenosis.  Right AICA origins are normal.  Small posterior communicating arteries.  SCA and PCA origins are within normal limits.  Bilateral PCA branches are within normal limits.  Antegrade flow in both ICA siphons.  Tortuous distal cervical right ICA.  Mild ICA siphon irregularity without stenosis.  Ophthalmic and posterior communicating artery origins are within normal limits.  Normal carotid termini, MCA and ACA origins.  Mildly dominant right ACA A1 segment.  Normal anterior communicating artery.  Visualized bilateral MCA and ACA branches are within normal limits.  IMPRESSION: Negative for age intracranial MRA.   Original Report Authenticated By: Erskine Speed, M.D.    Medications:  Medications reviewed  Scheduled Meds: . aspirin  325 mg Oral Daily  . B-complex with vitamin C  1 tablet Oral Daily  . bethanechol  25 mg Oral TID  . cholecalciferol  1,000 Units Oral Daily  . enoxaparin (LOVENOX) injection  40 mg Subcutaneous Q24H  . fluticasone  2 spray Each Nare Daily  . gabapentin  100 mg Oral TID  . levothyroxine  50 mcg Oral Daily  . multivitamin with minerals  1 tablet Oral Daily  . nabumetone  500 mg Oral BID  . pantoprazole  80 mg Oral Daily  . Point Trial - clopidogrel / placebo (daily dose)  (PI-Sethi)  75 mg Oral Q breakfast  . rOPINIRole  3 mg Oral BID  . sodium chloride  3 mL Intravenous Q12H  . temazepam  30 mg Oral QHS  . traMADol  50 mg Oral 5 X Daily   Continuous Infusions:  PRN Meds:.sodium chloride, cyclobenzaprine, HYDROcodone-acetaminophen, sodium chloride  Assessment/Plan:  TIA vs Gabapentin  Overdose: Patient with symptoms of CVA with quick resolution consistent with TIA. Also consider gabapentin overdose/side effect with recent increased dose and timing of altered mental status fits with medication dosing.  Head CT negative for acute stroke/hemorrhage. Patient does not have classic risk factors for CVA including diabetes, hypertension hyperlipidemia, cigarette smoking. She stopped smoking about 20 years ago.  Code stroke was called and neuro consultation was provided by Dr. [name] in ED.  No rhythm abnormalities on EKG or telemetry so far. No previous history of arrhythmias.  *MRI/MRA negative, carotid dopplers with anterograde flow, no significant narrowing noted* A1c 5.9  -appreciate neuro recommendations -UDS, EEG -ECHO still pending - decrease gabapentin dose to 100mg /100mg /200mg  (considered discontinuing this medication, however, she may benefit if we do a slower escalation) -FLP with LDL 183, will dc with statin today  Restless leg syndrome : Continue home medication including Requip and Relafen.  Chronic pain syndrome : Continue home Vicodin and tramadol.    DVT prophylaxis: Lovenox.   Dispo:  DC home today The patient does have a current PCP (DOUGH,ROBERT, MD), therefore will not be requiring OPC follow-up after discharge.     LOS: 1 day   [place] Ar 12/08/2012, 8:31 AM

## 2012-12-08 NOTE — Discharge Instructions (Signed)
Transient Ischemic Attack A transient ischemic attack (TIA) is a "warning stroke" that causes stroke-like symptoms. Unlike a stroke, a TIA does not cause permanent damage to the brain. The symptoms of a TIA can happen very fast and do not last long. It is important to know the symptoms of a TIA and what to do. This can help prevent a major stroke or death. CAUSES   A TIA is caused by a temporary blockage in an artery in the brain or neck (carotid artery). The blockage does not allow the brain to get the blood supply it needs and can cause different symptoms. The blockage can be caused by either:  A blood clot.  Fatty buildup (plaque) in a neck or brain artery. SYMPTOMS  TIA symptoms are the same as a stroke but are temporary. Symptoms can include sudden:  Numbness or weakness on one side of the body. Especially to the:  Face.  Arm.  Leg.  Trouble speaking, thinking, or confusion.  Change in vision, such as trouble seeing in one or both eyes.  Dizziness, loss of balance, or difficulty walking.  Severe headache. ANY OF THESE SYMPTOMS MAY REPRESENT A SERIOUS PROBLEM THAT IS AN EMERGENCY. Do not wait to see if the symptoms will go away. Get medical help at once. Call your local emergency services (911 in [place] IMMEDIATELY. DO NOT drive yourself to the hospital. RISK FACTORS Risk factors can increase the risk of developing a TIA. These can include.   High blood pressure (hypertension).  High cholesterol (hyperlipidemia).  Heart disease (atherosclerosis).  Smoking.  Diabetes.  Abnormal heart rhythm (atrial fibrillation).  Family history of a stroke or heart attack.  Use of oral contraceptives (especially when combined with smoking). DIAGNOSIS   A TIA can be diagnosed based on your:  Symptoms.  History.  Risk factors.  Tests that can help diagnose the symptoms of a TIA include:  CT or MRI scan. These tests can provide detailed images of the brain.  Carotid  ultrasound. This test looks to see if there are blockages in the carotid arteries of your neck.  Arteriography. A thin, small flexible tube (catheter) is inserted through a small cut (incision) in your groin. The catheter is threaded to your carotid or vertebral artery. A dye is then injected into the catheter. The dye highlights the arteries in your brain and allows your caregiver to look for narrowing or blockages that can cause a TIA. TREATMENT  Based on the cause of a TIA, treatment options can vary. Treatment is important to help prevent a stroke. Treatment options can include:  Medication. Such as:  Clot-busting medicine.  Anti-platelet medicine.  Blood pressure medicine.  Blood thinner medicine.  Surgery:  Carotid endarterectomy. The carotid arteries are the arteries that supply the head and neck with oxygenated blood. This surgery can help remove fatty deposits (plaque) in the carotid arteries.  Angioplasty and stenting. This surgery uses a balloon to dilate a blocked artery in the brain. A stent is a small, metal mesh tube that can help keep an artery open HOME CARE INSTRUCTIONS   It is important to take all medicine as told by your caregiver. If the medicine has side effects that affect you negatively, tell your caregiver right away. Do not stop taking medicine unless told by your caregiver. Some medicines may need to be changed to better treat your condition.  Do not smoke. Talk to your caregiver on how to quit smoking.  Eat a diet high in fruits,  vegetables and lean meat. Avoid a high fat, high salt diet. A dietician can you help you make healthy food choices.  Maintain a healthy weight. Develop an exercise plan approved by your caregiver. SEEK IMMEDIATE MEDICAL CARE IF:   You develop weakness or numbness on one side of your body.  You have problems thinking, speaking, or feel confused.  You have vision changes.  You feel dizzy, have trouble walking, or lose your  balance.  You develop a severe headache. MAKE SURE YOU:   Understand these instructions.  Will watch your condition.  Will get help right away if you are not doing well or get worse. Document Released: 07/23/2005 Document Revised: 01/05/2012 Document Reviewed: 12/06/2009 [facility] Patient Information 2013 [place], [place]. STROKE/TIA DISCHARGE INSTRUCTIONS SMOKING Cigarette smoking nearly doubles your risk of having a stroke & is the single most alterable risk factor  If you smoke or have smoked in the last 12 months, you are advised to quit smoking for your health.  Most of the excess cardiovascular risk related to smoking disappears within a year of stopping.  Ask you doctor about anti-smoking medications  [place] Quit Line: 1-800-QUIT NOW  Free Smoking Cessation Classes [phone]  CHOLESTEROL Know your levels; limit fat & cholesterol in your diet  Lipid Panel     Component Value Date/Time   CHOL 274* 12/08/2012 0650   TRIG 200* 12/08/2012 0650   HDL 51 12/08/2012 0650   CHOLHDL 5.4 12/08/2012 0650   VLDL 40 12/08/2012 0650   LDLCALC 183* 12/08/2012 0650      Many patients benefit from treatment even if their cholesterol is at goal.  Goal: Total Cholesterol (CHOL) less than 160  Goal:  Triglycerides (TRIG) less than 150  Goal:  HDL greater than 40  Goal:  LDL (LDLCALC) less than 100   BLOOD PRESSURE American Stroke Association blood pressure target is less that 120/80 mm/Hg  Your discharge blood pressure is:  BP: 129/54 mmHg  Monitor your blood pressure  Limit your salt and alcohol intake  Many individuals will require more than one medication for high blood pressure  DIABETES (A1c is a blood sugar average for last 3 months) Goal HGBA1c is under 7% (HBGA1c is blood sugar average for last 3 months)  Diabetes: {STROKE DC DIABETES:22357}    Lab Results  Component Value Date   HGBA1C 5.9* 12/07/2012     Your HGBA1c can be lowered with medications, healthy diet, and  exercise.  Check your blood sugar as directed by your physician  Call your physician if you experience unexplained or low blood sugars.  PHYSICAL ACTIVITY/REHABILITATION Goal is 30 minutes at least 4 days per week    {STROKE DC ACTIVITY/REHAB:22359}  Activity decreases your risk of heart attack and stroke and makes your heart stronger.  It helps control your weight and blood pressure; helps you relax and can improve your mood.  Participate in a regular exercise program.  Talk with your doctor about the best form of exercise for you (dancing, walking, swimming, cycling).  DIET/WEIGHT Goal is to maintain a healthy weight  Your discharge diet is: General *** liquids Your height is:  Height: 5' (152.4 cm) Your current weight is: Weight: 50.8 kg (111 lb 15.9 oz) Your Body Mass Index (BMI) is:  BMI (Calculated): 21.9  Following the type of diet specifically designed for you will help prevent another stroke.  Your goal weight range is:  ***  Your goal Body Mass Index (BMI) is 19-24.  Healthy  food habits can help reduce 3 risk factors for stroke:  High cholesterol, hypertension, and excess weight.  RESOURCES Stroke/Support Group:  Call [phone]  they meet the 3rd Sunday of the month on the Rehab Unit at [facility], [place] ( no meetings June, July & Aug).  STROKE EDUCATION PROVIDED/REVIEWED AND GIVEN TO PATIENT Stroke warning signs and symptoms How to activate emergency medical system (call 911). Medications prescribed at discharge. Need for follow-up after discharge. Personal risk factors for stroke. Pneumonia vaccine given:   {STROKE DC YES/NO/DATE:22363} Flu vaccine given:   {STROKE DC YES/NO/DATE:22363} My questions have been answered, the writing is legible, and I understand these instructions.  I will adhere to these goals & educational materials that have been provided to me after my discharge from the hospital.

## 2012-12-08 NOTE — Evaluation (Signed)
Occupational Therapy Evaluation and Discharge Summary Patient Details Name: [name] MRN: [id number] DOB: 03-28-1946 Today's Date: 12/08/2012 Time: 4098-1191 OT Time Calculation (min): 20 min  OT Assessment / Plan / Recommendation Clinical Impression  Pt was admitted for TIA and now has returned to baseline.  No Ot needs at this time.    OT Assessment  Patient does not need any further OT services    Follow Up Recommendations  No OT follow up    Barriers to Discharge      Equipment Recommendations  None recommended by OT    Recommendations for Other Services    Frequency       Precautions / Restrictions Precautions Precautions: None Restrictions Weight Bearing Restrictions: No   Pertinent Vitals/Pain Pt with mild pain in R lower leg.    ADL  Eating/Feeding: Performed;Independent Where Assessed - Eating/Feeding: Chair Grooming: Performed;Independent Where Assessed - Grooming: Unsupported standing Upper Body Bathing: Simulated;Independent Where Assessed - Upper Body Bathing: Unsupported sit to stand Lower Body Bathing: Performed;Independent Where Assessed - Lower Body Bathing: Unsupported sit to stand Upper Body Dressing: Performed;Independent Where Assessed - Upper Body Dressing: Unsupported sitting Lower Body Dressing: Performed;Independent Where Assessed - Lower Body Dressing: Unsupported sit to stand Toilet Transfer: Performed;Independent Toilet Transfer Method: Sit to Barista: Comfort height toilet Toileting - Clothing Manipulation and Hygiene: Performed;Independent Where Assessed - Toileting Clothing Manipulation and Hygiene: Standing Tub/Shower Transfer: Performed;Independent Tub/Shower Transfer Method: Stand pivot Transfers/Ambulation Related to ADLs: independent ADL Comments: independent    OT Diagnosis:    OT Problem List:   OT Treatment Interventions:     OT Goals    Visit Information  Last OT Received On:  12/08/12 Assistance Needed: +1    Subjective Data  Subjective: "I am ready to go home." Patient Stated Goal: go home   Prior Functioning     Home Living Lives With: Spouse Available Help at Discharge: Family Type of Home: House Home Access: Stairs to enter Secretary/administrator of Steps: 4 Home Layout: One level Firefighter: Standard Home Adaptive Equipment: None Prior Function Level of Independence: Independent Able to Take Stairs?: Yes Driving: Yes Vocation: Retired Musician: No difficulties Dominant Hand: Right         Vision/Perception Vision - History Baseline Vision: No visual deficits   Cognition  Cognition Overall Cognitive Status: Appears within functional limits for tasks assessed/performed Arousal/Alertness: Awake/alert Orientation Level: Oriented X4 / Intact Behavior During Session: WFL for tasks performed Cognition - Other Comments: intact    Extremity/Trunk Assessment Right Upper Extremity Assessment RUE ROM/Strength/Tone: Within functional levels RUE Sensation: WFL - Light Touch RUE Coordination: WFL - gross/fine motor Left Upper Extremity Assessment LUE ROM/Strength/Tone: Within functional levels LUE Sensation: WFL - Light Touch LUE Coordination: WFL - gross/fine motor Trunk Assessment Trunk Assessment: Normal     Mobility Bed Mobility Details for Bed Mobility Assistance: independent Transfers Transfers: Sit to Stand;Stand to Sit Sit to Stand: 7: Independent Stand to Sit: 7: Independent Details for Transfer Assistance: stairs Ily.     Exercise     Balance Balance Balance Assessed: Yes   End of Session OT - End of Session Activity Tolerance: Patient tolerated treatment well Patient left: in chair;Other (comment) (leaving for dopplers) Nurse Communication: Mobility status  GO Functional Assessment Tool Used: clinical judgement Functional Limitation: Self care Self Care Current Status (Y7829): 0 percent  impaired, limited or restricted Self Care Goal Status (F6213): 0 percent impaired, limited or restricted Self Care Discharge Status (  Z6109): 0 percent impaired, limited or restricted   Hope Budds 12/08/2012, 9:00 AM [phone]

## 2012-12-08 NOTE — H&P (Signed)
Internal Medicine Attending Admission Note Date: 12/08/2012  Patient name: [name] Medical record number: [id number] Date of birth: 03-29-1946 Age: 67 y.o. Gender: female  I saw and evaluated the patient. I reviewed the resident's note and I agree with the resident's findings and plan as documented in the resident's note.  Chief Complaint(s): Slurred speech, unsteady gait, amnesia.  History - key components related to admission:  [name] is a 67 year old woman with a history of chronic low back pain, gastroesophageal reflux disease, restless leg syndrome, degenerative joint disease, and hypothyroidism who presents to the emergency department with slurred speech, unsteady gait, and amnesia. [name] was in her usual state of health on the evening prior to admission when she went to bed feeling fine. When she awoke she mentioned to her husband that she was dizzy. Several minutes later he saw her in the kitchen and she was wobbly and to the left and the right. Her speech was slurred but he did not notice any facial droop. She then sat down and slumped over. He called EMS and she was brought to the emergency department. Apparently, her symptoms improved on the ambulance ride over. She was seen by neurology for a possible stroke. Carotid Dopplers, CT of the head, an MRI/MRA were unremarkable. The initial neurologist felt she may have had a TIA. With more history it was found that she had recently increased her gabapentin dose 2 weeks ago. At that point she was feeling dizzy on it and she was instructed to decrease the dose which he had been doing. She does not remember anything from the morning of admission until well into her emergency department course. She currently feels fine without any weakness, numbness, uncorrordination, headaches, fevers, chills, nausea, vomiting, diarrhea, chest pain, palpitations, or shortness of breath.  She has no history of diabetes, hypertension, or family history of  strokes. She was just recently told she has hyperlipidemia and smoked for a few years in her youth. She is without any other acute complaints.  Physical Exam - key components related to admission:  Filed Vitals:   12/08/12 0200 12/08/12 0600 12/08/12 0830 12/08/12 1020  BP: 108/50 101/51 108/47 129/54  Pulse: 84 84 86 88  Temp: 97.6 F (36.4 C) 97.6 F (36.4 C) 97.7 F (36.5 C) 97.6 F (36.4 C)  TempSrc:    Oral  Resp: 16 16 18 18   Height:      Weight:      SpO2: 100% 99% 100% 100%   General: Well-developed, well-nourished, woman lying comfortably in bed in no acute distress. Lungs: Clear to auscultation bilaterally without wheezes, rhonchi, or rales. Heart: Regular rate and rhythm without murmurs, rubs, or gallops. Abdomen: Soft, nontender, active bowel sounds. Extremities: Without edema. Neuro: Alert and oriented x 3. Cranial nerve II through XII intact. Strength 5/5 throughout in all muscle groups. Sensation to light touch intact throughout. Deep tendon reflexes 2+ throughout.  Lab results:  Basic Metabolic Panel:  Recent Labs  40/98/11 0848 12/08/12 0650  NA 137 134*  K 4.2 3.8  CL 101 98  CO2 28 26  GLUCOSE 108* 92  BUN 23 14  CREATININE 0.57 0.51  CALCIUM 9.3 9.0   Liver Function Tests:  Recent Labs  12/07/12 0848  AST 27  ALT 17  ALKPHOS 54  BILITOT 0.2*  PROT 6.8  ALBUMIN 3.7   CBC:  Recent Labs  12/07/12 0841 12/07/12 0848 12/08/12 0650  WBC  --  4.6 5.0  NEUTROABS  --  2.7  --   HGB 13.9 13.3 12.6  HCT 41.0 38.2 36.4  MCV  --  91.2 89.7  PLT  --  266 280   Cardiac Enzymes:  Recent Labs  12/07/12 0848  TROPONINI <0.30   Hemoglobin A1C:  Recent Labs  12/07/12 1617  HGBA1C 5.9*   Fasting Lipid Panel:  Recent Labs  12/08/12 0650  CHOL 274*  HDL 51  LDLCALC 183*  TRIG 200*  CHOLHDL 5.4   Coagulation:  Recent Labs  12/07/12 0848  INR 0.93   Urine Drug Screen:  Negative  Imaging results:  Ct Head Wo  Contrast  12/07/2012  *RADIOLOGY REPORT*  Clinical Data: Code stroke  CT HEAD WITHOUT CONTRAST  Technique:  Contiguous axial images were obtained from the base of the skull through the vertex without contrast.  Comparison: None.  Findings: No skull fracture is noted.  Paranasal sinuses and mastoid air cells are unremarkable.  No intracranial hemorrhage, mass effect or midline shift.  No acute infarction.  No mass lesion is noted on this unenhanced scan. No hydrocephalus.  IMPRESSION: No acute intracranial abnormality.  No definite acute infarction.   Original Report Authenticated By: Natasha Mead, M.D.    Mr Angiogram Head Wo Contrast  12/07/2012  *RADIOLOGY REPORT*  Clinical Data:  67 year old female with headache and expressive aphasia. Transient dizziness.  Comparison: Head CT without contrast the same day.  MRI HEAD WITHOUT CONTRAST  Technique: Multiplanar, multiecho pulse sequences of the brain and surrounding structures were obtained according to standard protocol without intravenous contrast.  Findings: Cerebral volume is within normal limits for age.  No restricted diffusion to suggest acute infarction.  No midline shift, mass effect, evidence of mass lesion, ventriculomegaly, extra-axial collection or acute intracranial hemorrhage. Cervicomedullary junction and pituitary are within normal limits. Major intracranial vascular flow voids are preserved, dominant distal left vertebral artery.  Scattered periventricular and subcortical white matter T2 and FLAIR hyperintensity.  This is most pronounced in the right periatrial region.  Extent is overall mild for age.  No cortical encephalomalacia.  Deep gray matter nuclei, brainstem, cerebellum, and visualized cervical spine are within normal limits.  Visualized bone marrow signal is within normal limits.  Visualized orbit soft tissues are within normal limits.  Visualized paranasal sinuses and mastoids are clear.  Grossly normal visualized internal auditory  structures.  Negative scalp soft tissues.  IMPRESSION: 1. No acute intracranial abnormality. 2.  Mild for age nonspecific cerebral white matter signal changes. 3.  Intracranial MRA findings are below.  MRA HEAD WITHOUT CONTRAST  Technique: Angiographic images of the Circle of Willis were obtained using MRA technique without  intravenous contrast.  Findings: Antegrade flow in the posterior circulation.  Dominant distal left vertebral artery.  Diminutive right vertebral artery functionally terminates in PICA.  Normal left PICA.  Mild tortuosity of the basilar artery without stenosis.  Right AICA origins are normal.  Small posterior communicating arteries.  SCA and PCA origins are within normal limits.  Bilateral PCA branches are within normal limits.  Antegrade flow in both ICA siphons.  Tortuous distal cervical right ICA.  Mild ICA siphon irregularity without stenosis.  Ophthalmic and posterior communicating artery origins are within normal limits.  Normal carotid termini, MCA and ACA origins.  Mildly dominant right ACA A1 segment.  Normal anterior communicating artery.  Visualized bilateral MCA and ACA branches are within normal limits.  IMPRESSION: Negative for age intracranial MRA.   Original Report Authenticated By: Erskine Speed, M.D.  Mr Brain Wo Contrast  12/07/2012  *RADIOLOGY REPORT*  Clinical Data:  67 year old female with headache and expressive aphasia. Transient dizziness.  Comparison: Head CT without contrast the same day.  MRI HEAD WITHOUT CONTRAST  Technique: Multiplanar, multiecho pulse sequences of the brain and surrounding structures were obtained according to standard protocol without intravenous contrast.  Findings: Cerebral volume is within normal limits for age.  No restricted diffusion to suggest acute infarction.  No midline shift, mass effect, evidence of mass lesion, ventriculomegaly, extra-axial collection or acute intracranial hemorrhage. Cervicomedullary junction and pituitary are  within normal limits. Major intracranial vascular flow voids are preserved, dominant distal left vertebral artery.  Scattered periventricular and subcortical white matter T2 and FLAIR hyperintensity.  This is most pronounced in the right periatrial region.  Extent is overall mild for age.  No cortical encephalomalacia.  Deep gray matter nuclei, brainstem, cerebellum, and visualized cervical spine are within normal limits.  Visualized bone marrow signal is within normal limits.  Visualized orbit soft tissues are within normal limits.  Visualized paranasal sinuses and mastoids are clear.  Grossly normal visualized internal auditory structures.  Negative scalp soft tissues.  IMPRESSION: 1. No acute intracranial abnormality. 2.  Mild for age nonspecific cerebral white matter signal changes. 3.  Intracranial MRA findings are below.  MRA HEAD WITHOUT CONTRAST  Technique: Angiographic images of the Circle of Willis were obtained using MRA technique without  intravenous contrast.  Findings: Antegrade flow in the posterior circulation.  Dominant distal left vertebral artery.  Diminutive right vertebral artery functionally terminates in PICA.  Normal left PICA.  Mild tortuosity of the basilar artery without stenosis.  Right AICA origins are normal.  Small posterior communicating arteries.  SCA and PCA origins are within normal limits.  Bilateral PCA branches are within normal limits.  Antegrade flow in both ICA siphons.  Tortuous distal cervical right ICA.  Mild ICA siphon irregularity without stenosis.  Ophthalmic and posterior communicating artery origins are within normal limits.  Normal carotid termini, MCA and ACA origins.  Mildly dominant right ACA A1 segment.  Normal anterior communicating artery.  Visualized bilateral MCA and ACA branches are within normal limits.  IMPRESSION: Negative for age intracranial MRA.   Original Report Authenticated By: Erskine Speed, M.D.    Other results:  EKG: Normal sinus rhythm at 90  beats per minute, normal axis and intervals, no significant Q waves or LVH, good R wave progression, no ST-T changes, no comparisons available.  Assessment & Plan by Problem:  Ms. Mateja is a 67 year old woman with a history of chronic low back pain, gastroesophageal reflux disease, restless leg syndrome, degenerative joint disease, and hypothyroidism who presents with the acute onset of slurred speech, unsteady gait, and amnesia. An extensive evaluation failed to demonstrate any evidence of stroke. She may have had a transient ischemic attack without any structural deficits. On the other hand, she also may have accidentally overdosed on a medication such as gabapentin. This theory would explain her somnolence, amnesia, and slurred speech. These symptoms would be very unusual for TIA.  1) Acute neurologic symptoms: Ms. Kitch aspirin was increased to 325 mg per day. Her gabapentin was decreased to 100 mg in the morning 100 mg at noon and 200 mg at night. She is scheduled to see her family physician in one week and it was recommended that they review her medication list and remove any medications that may be ineffective or unnecessary. She will be started on a statin for her  hyperlipidemia.  2) Disposition: I agree with the housestaff's plan to discharge Ms. Schnebly home today with followup in her family medicine physician's office in one week as already scheduled.

## 2012-12-08 NOTE — Discharge Summary (Signed)
Internal Medicine Teaching [facility] Discharge Note  Name: [name] MRN: [id number] DOB: Aug 17, 1946 67 y.o.  Date of Admission: 12/07/2012  8:32 AM Date of Discharge: 12/08/2012 Attending Physician: Rocco Serene, MD  Discharge Diagnosis: Principal Problem:   TIA (transient ischemic attack) Active Problems:   Hypothyroidism   Restless leg syndrome   GERD (gastroesophageal reflux disease)   Chronic pain   Discharge Medications:   Medication List    STOP taking these medications       aspirin EC 81 MG tablet      TAKE these medications       Alpha-Lipoic Acid 100 MG Caps  Take 1 capsule by mouth daily.     aspirin 325 MG tablet  Take 1 tablet (325 mg total) by mouth daily.     b complex vitamins tablet  Take 2 tablets by mouth daily.     bethanechol 25 MG tablet  Commonly known as:  URECHOLINE  Take 25 mg by mouth 3 (three) times daily.     cholecalciferol 1000 UNITS tablet  Commonly known as:  VITAMIN D  Take 1,000 Units by mouth daily.     CoQ-10 100 MG Caps  Take 100 mg by mouth every evening.     cyclobenzaprine 10 MG tablet  Commonly known as:  FLEXERIL  Take 10 mg by mouth 2 (two) times daily as needed for muscle spasms (for muscle spasams).     fluticasone 50 MCG/ACT nasal spray  Commonly known as:  FLONASE  Place 2 sprays into the nose daily.     gabapentin 100 MG capsule  Commonly known as:  NEURONTIN  Take 1 capsule (100 mg total) by mouth 3 (three) times daily. 100 mg every morning and at noon, and 200 mg every evening     HYDROcodone-acetaminophen 5-325 MG per tablet  Commonly known as:  NORCO/VICODIN  Take 1 tablet by mouth every 6 (six) hours as needed for pain (for pain).     levothyroxine 50 MCG tablet  Commonly known as:  SYNTHROID, LEVOTHROID  Take 50 mcg by mouth daily.     Melatonin 3 MG Caps  Take 3 mg by mouth at bedtime.     multivitamin with minerals Tabs  Take 1 tablet by mouth daily.     nabumetone 500 MG  tablet  Commonly known as:  RELAFEN  Take 500 mg by mouth 2 (two) times daily.     omeprazole 40 MG capsule  Commonly known as:  PRILOSEC  Take 40 mg by mouth every evening.     rOPINIRole 1 MG tablet  Commonly known as:  REQUIP  Take 3 mg by mouth 2 (two) times daily.     simvastatin 20 MG tablet  Commonly known as:  ZOCOR  Take 1 tablet (20 mg total) by mouth daily at 6 PM.     temazepam 30 MG capsule  Commonly known as:  RESTORIL  Take 30 mg by mouth at bedtime.     traMADol 50 MG tablet  Commonly known as:  ULTRAM  Take 50 mg by mouth 5 (five) times daily.        Disposition and follow-up:   Ms.Minha K Salsberry was discharged from [facility] in Stable condition.  At the hospital follow up visit please address the following:  -inquire about side effects of gabapentin and evaluate whether this medication is still appropriate and effective for this patient. She was discharged on a lower dose with  the thought that an accidental overdose contributed to her presenting symptoms. Please consider a slower escalation of dose or discontinuation of this medication if it is not effective for pain control. -f/u compliance with statin therapy -cont CVA risk stratification  Follow-up Appointments:  Patient has a follow up appointment with her PCP the week after discharge.   Consultations: Treatment Team:  Md Stroke, MD  Procedures Performed:  Ct Head Wo Contrast  12/07/2012  *RADIOLOGY REPORT*  Clinical Data: Code stroke  CT HEAD WITHOUT CONTRAST  Technique:  Contiguous axial images were obtained from the base of the skull through the vertex without contrast.  Comparison: None.  Findings: No skull fracture is noted.  Paranasal sinuses and mastoid air cells are unremarkable.  No intracranial hemorrhage, mass effect or midline shift.  No acute infarction.  No mass lesion is noted on this unenhanced scan. No hydrocephalus.  IMPRESSION: No acute intracranial abnormality.  No  definite acute infarction.   Original Report Authenticated By: Natasha Mead, M.D.    Mr Angiogram Head Wo Contrast  12/07/2012  *RADIOLOGY REPORT*  Clinical Data:  67 year old female with headache and expressive aphasia. Transient dizziness.  Comparison: Head CT without contrast the same day.  MRI HEAD WITHOUT CONTRAST  Technique: Multiplanar, multiecho pulse sequences of the brain and surrounding structures were obtained according to standard protocol without intravenous contrast.  Findings: Cerebral volume is within normal limits for age.  No restricted diffusion to suggest acute infarction.  No midline shift, mass effect, evidence of mass lesion, ventriculomegaly, extra-axial collection or acute intracranial hemorrhage. Cervicomedullary junction and pituitary are within normal limits. Major intracranial vascular flow voids are preserved, dominant distal left vertebral artery.  Scattered periventricular and subcortical white matter T2 and FLAIR hyperintensity.  This is most pronounced in the right periatrial region.  Extent is overall mild for age.  No cortical encephalomalacia.  Deep gray matter nuclei, brainstem, cerebellum, and visualized cervical spine are within normal limits.  Visualized bone marrow signal is within normal limits.  Visualized orbit soft tissues are within normal limits.  Visualized paranasal sinuses and mastoids are clear.  Grossly normal visualized internal auditory structures.  Negative scalp soft tissues.  IMPRESSION: 1. No acute intracranial abnormality. 2.  Mild for age nonspecific cerebral white matter signal changes. 3.  Intracranial MRA findings are below.  MRA HEAD WITHOUT CONTRAST  Technique: Angiographic images of the Circle of Willis were obtained using MRA technique without  intravenous contrast.  Findings: Antegrade flow in the posterior circulation.  Dominant distal left vertebral artery.  Diminutive right vertebral artery functionally terminates in PICA.  Normal left PICA.   Mild tortuosity of the basilar artery without stenosis.  Right AICA origins are normal.  Small posterior communicating arteries.  SCA and PCA origins are within normal limits.  Bilateral PCA branches are within normal limits.  Antegrade flow in both ICA siphons.  Tortuous distal cervical right ICA.  Mild ICA siphon irregularity without stenosis.  Ophthalmic and posterior communicating artery origins are within normal limits.  Normal carotid termini, MCA and ACA origins.  Mildly dominant right ACA A1 segment.  Normal anterior communicating artery.  Visualized bilateral MCA and ACA branches are within normal limits.  IMPRESSION: Negative for age intracranial MRA.   Original Report Authenticated By: Erskine Speed, M.D.    Mr Brain Wo Contrast  12/07/2012  *RADIOLOGY REPORT*  Clinical Data:  68 year old female with headache and expressive aphasia. Transient dizziness.  Comparison: Head CT without contrast the same day.  MRI HEAD WITHOUT CONTRAST  Technique: Multiplanar, multiecho pulse sequences of the brain and surrounding structures were obtained according to standard protocol without intravenous contrast.  Findings: Cerebral volume is within normal limits for age.  No restricted diffusion to suggest acute infarction.  No midline shift, mass effect, evidence of mass lesion, ventriculomegaly, extra-axial collection or acute intracranial hemorrhage. Cervicomedullary junction and pituitary are within normal limits. Major intracranial vascular flow voids are preserved, dominant distal left vertebral artery.  Scattered periventricular and subcortical white matter T2 and FLAIR hyperintensity.  This is most pronounced in the right periatrial region.  Extent is overall mild for age.  No cortical encephalomalacia.  Deep gray matter nuclei, brainstem, cerebellum, and visualized cervical spine are within normal limits.  Visualized bone marrow signal is within normal limits.  Visualized orbit soft tissues are within normal  limits.  Visualized paranasal sinuses and mastoids are clear.  Grossly normal visualized internal auditory structures.  Negative scalp soft tissues.  IMPRESSION: 1. No acute intracranial abnormality. 2.  Mild for age nonspecific cerebral white matter signal changes. 3.  Intracranial MRA findings are below.  MRA HEAD WITHOUT CONTRAST  Technique: Angiographic images of the Circle of Willis were obtained using MRA technique without  intravenous contrast.  Findings: Antegrade flow in the posterior circulation.  Dominant distal left vertebral artery.  Diminutive right vertebral artery functionally terminates in PICA.  Normal left PICA.  Mild tortuosity of the basilar artery without stenosis.  Right AICA origins are normal.  Small posterior communicating arteries.  SCA and PCA origins are within normal limits.  Bilateral PCA branches are within normal limits.  Antegrade flow in both ICA siphons.  Tortuous distal cervical right ICA.  Mild ICA siphon irregularity without stenosis.  Ophthalmic and posterior communicating artery origins are within normal limits.  Normal carotid termini, MCA and ACA origins.  Mildly dominant right ACA A1 segment.  Normal anterior communicating artery.  Visualized bilateral MCA and ACA branches are within normal limits.  IMPRESSION: Negative for age intracranial MRA.   Original Report Authenticated By: Erskine Speed, M.D.     2D Echo:  12/07/12  Study Conclusions  - Left ventricle: The cavity size was normal. Systolic function was normal. The estimated ejection fraction was in the range of 50% to 55%. Wall motion was normal; there were no regional wall motion abnormalities. - Atrial septum: There was an atrial septal aneurysm.   Admission HPI:  Ms. Armbrister is a 67 year old woman with a history of chronic low back pain, gastroesophageal reflux disease, restless leg syndrome, degenerative joint disease, and hypothyroidism who presents to the emergency department with slurred speech,  unsteady gait, and amnesia. Ms. Meding was in her usual state of health on the evening prior to admission when she went to bed feeling fine. When she awoke she mentioned to her husband that she was dizzy. Several minutes later he saw her in the kitchen and she was wobbly and to the left and the right. Her speech was slurred but he did not notice any facial droop. She then sat down and slumped over. He called EMS and she was brought to the emergency department. Apparently, her symptoms improved on the ambulance ride over. She was seen by neurology for a possible stroke. Carotid Dopplers, CT of the head, an MRI/MRA were unremarkable. The initial neurologist felt she may have had a TIA. With more history it was found that she had recently increased her gabapentin dose 2 weeks ago. At that point she  was feeling dizzy on it and she was instructed to decrease the dose which he had been doing. She does not remember anything from the morning of admission until well into her emergency department course. She currently feels fine without any weakness, numbness, uncorrordination, headaches, fevers, chills, nausea, vomiting, diarrhea, chest pain, palpitations, or shortness of breath. She has no history of diabetes, hypertension, or family history of strokes. She was just recently told she has hyperlipidemia and smoked for a few years in her youth. She is without any other acute complaints.   Hospital Course by problem list: Principal Problem:   TIA (transient ischemic attack) Active Problems:   Hypothyroidism   Restless leg syndrome   GERD (gastroesophageal reflux disease)   Chronic pain  TIA vs Gabapentin Overdose:  Patient presented with AMS, slurred speech, and dizziness which resolved shortly after arrival to the ED. Code stroke was initiated and stoke work up was started. She got a head CT which did not show any hemorrhage, MRI brain without acute infarct, mild nonspecific white matter changes. MRA unremarkable.  Carotid dopplers showed normal flow without stenosis. After obtaining further history from the patient and discussion with Dr. Anne Hahn and stroke team, TIA thought to be less likely as patient has amnesia surrounding the events, which is unusual in a TIA and more consistent with medication overdose though patient denied taking more gabapentin than usual. Patient's husband did express some concern privately to me about the patient's excessive medications that she takes for pain. EEG without seizure activity, UDS negative. On the day of discharge, patient was back to baseline mental status without any notable neurologic deficits. She was given instructions to use ASA 325mg  daily at home for stroke prevention. She was also given Rx for simvastatin as she was found to have LDL 183. She will follow up with PCP next week. Also dc'd with lower dose of gabapentin (100mg  AM/100mg  noon /200mg  QHS) and instructions to avoid taking more of this medication than prescribed.     Discharge Vitals:  BP 108/47  Pulse 86  Temp(Src) 97.7 F (36.5 C) (Oral)  Resp 18  Ht 5' (1.524 m)  Wt 111 lb 15.9 oz (50.8 kg)  BMI 21.87 kg/m2  SpO2 100%  Discharge Labs:  Results for orders placed during the hospital encounter of 12/07/12 (from the past 24 hour(s))  HEMOGLOBIN A1C     Status: Abnormal   Collection Time    12/07/12  4:17 PM      Result Value Range   Hemoglobin A1C 5.9 (*) <5.7 %   Mean Plasma Glucose 123 (*) <117 mg/dL  BASIC METABOLIC PANEL     Status: Abnormal   Collection Time    12/08/12  6:50 AM      Result Value Range   Sodium 134 (*) 135 - 145 mEq/L   Potassium 3.8  3.5 - 5.1 mEq/L   Chloride 98  96 - 112 mEq/L   CO2 26  19 - 32 mEq/L   Glucose, Bld 92  70 - 99 mg/dL   BUN 14  6 - 23 mg/dL   Creatinine, Ser 9.60  0.50 - 1.10 mg/dL   Calcium 9.0  8.4 - 45.4 mg/dL   GFR calc non Af Amer >90  >90 mL/min   GFR calc Af Amer >90  >90 mL/min  CBC     Status: None   Collection Time    12/08/12   6:50 AM      Result Value Range  WBC 5.0  4.0 - 10.5 K/uL   RBC 4.06  3.87 - 5.11 MIL/uL   Hemoglobin 12.6  12.0 - 15.0 g/dL   HCT 16.1  09.6 - 04.5 %   MCV 89.7  78.0 - 100.0 fL   MCH 31.0  26.0 - 34.0 pg   MCHC 34.6  30.0 - 36.0 g/dL   RDW 40.9  81.1 - 91.4 %   Platelets 280  150 - 400 K/uL  LIPID PANEL     Status: Abnormal   Collection Time    12/08/12  6:50 AM      Result Value Range   Cholesterol 274 (*) 0 - 200 mg/dL   Triglycerides 782 (*) <150 mg/dL   HDL 51  >95 mg/dL   Total CHOL/HDL Ratio 5.4     VLDL 40  0 - 40 mg/dL   LDL Cholesterol 621 (*) 0 - 99 mg/dL    Signed: Denton Ar 12/08/2012, 10:14 AM   Time Spent on Discharge: 35 min Services Ordered on Discharge: none Equipment Ordered on Discharge: none

## 2012-12-08 NOTE — Progress Notes (Signed)
VASCULAR LAB PRELIMINARY  PRELIMINARY  PRELIMINARY  PRELIMINARY  Carotid duplex completed.    Preliminary report:  Bilateral:  No evidence of hemodynamically significant internal carotid artery stenosis.   Vertebral artery flow is antegrade.     Adelaido Nicklaus, RVS 12/08/2012, 9:27 AM

## 2012-12-08 NOTE — Consult Note (Cosign Needed)
Elaine Furth EdD 

## 2012-12-19 NOTE — Discharge Summary (Signed)
Please note the principle problem was probable gabapentin overdose rather than TIA.

## 2013-03-03 ENCOUNTER — Telehealth: Payer: Self-pay | Admitting: *Deleted

## 2013-03-03 NOTE — Telephone Encounter (Signed)
Left Message for patient to call to R/S appointment 

## 2013-03-04 ENCOUNTER — Telehealth: Payer: Self-pay | Admitting: *Deleted

## 2013-03-04 NOTE — Telephone Encounter (Signed)
R/S to 05/17/13 per pt

## 2013-03-05 ENCOUNTER — Other Ambulatory Visit (HOSPITAL_COMMUNITY): Payer: Self-pay | Admitting: Internal Medicine

## 2013-03-13 ENCOUNTER — Other Ambulatory Visit (HOSPITAL_COMMUNITY): Payer: Self-pay | Admitting: Internal Medicine

## 2013-03-16 ENCOUNTER — Ambulatory Visit: Payer: Medicare Other | Admitting: Neurology

## 2013-05-17 ENCOUNTER — Ambulatory Visit: Payer: Self-pay | Admitting: Neurology

## 2013-06-21 ENCOUNTER — Inpatient Hospital Stay (HOSPITAL_COMMUNITY)
Admission: EM | Admit: 2013-06-21 | Discharge: 2013-06-26 | DRG: 871 | Disposition: A | Discharge status: Home / Self Care | Payer: Medicare Other | Attending: Internal Medicine | Admitting: Internal Medicine

## 2013-06-21 ENCOUNTER — Emergency Department (HOSPITAL_COMMUNITY): Payer: Medicare Other

## 2013-06-21 ENCOUNTER — Encounter (HOSPITAL_COMMUNITY): Payer: Self-pay | Admitting: Emergency Medicine

## 2013-06-21 ENCOUNTER — Other Ambulatory Visit: Payer: Self-pay

## 2013-06-21 ENCOUNTER — Inpatient Hospital Stay (HOSPITAL_COMMUNITY): Payer: Medicare Other

## 2013-06-21 DIAGNOSIS — K529 Noninfective gastroenteritis and colitis, unspecified: Secondary | ICD-10-CM

## 2013-06-21 DIAGNOSIS — E876 Hypokalemia: Secondary | ICD-10-CM | POA: Diagnosis not present

## 2013-06-21 DIAGNOSIS — Z113 Encounter for screening for infections with a predominantly sexual mode of transmission: Secondary | ICD-10-CM

## 2013-06-21 DIAGNOSIS — E872 Acidosis, unspecified: Secondary | ICD-10-CM | POA: Diagnosis present

## 2013-06-21 DIAGNOSIS — M129 Arthropathy, unspecified: Secondary | ICD-10-CM | POA: Diagnosis present

## 2013-06-21 DIAGNOSIS — I369 Nonrheumatic tricuspid valve disorder, unspecified: Secondary | ICD-10-CM

## 2013-06-21 DIAGNOSIS — E0590 Thyrotoxicosis, unspecified without thyrotoxic crisis or storm: Secondary | ICD-10-CM | POA: Diagnosis present

## 2013-06-21 DIAGNOSIS — E039 Hypothyroidism, unspecified: Secondary | ICD-10-CM

## 2013-06-21 DIAGNOSIS — K219 Gastro-esophageal reflux disease without esophagitis: Secondary | ICD-10-CM | POA: Diagnosis present

## 2013-06-21 DIAGNOSIS — J9600 Acute respiratory failure, unspecified whether with hypoxia or hypercapnia: Secondary | ICD-10-CM | POA: Diagnosis present

## 2013-06-21 DIAGNOSIS — E2740 Unspecified adrenocortical insufficiency: Secondary | ICD-10-CM

## 2013-06-21 DIAGNOSIS — G2581 Restless legs syndrome: Secondary | ICD-10-CM | POA: Diagnosis present

## 2013-06-21 DIAGNOSIS — A419 Sepsis, unspecified organism: Principal | ICD-10-CM

## 2013-06-21 DIAGNOSIS — E0781 Sick-euthyroid syndrome: Secondary | ICD-10-CM | POA: Diagnosis present

## 2013-06-21 DIAGNOSIS — R001 Bradycardia, unspecified: Secondary | ICD-10-CM

## 2013-06-21 DIAGNOSIS — G8929 Other chronic pain: Secondary | ICD-10-CM | POA: Diagnosis present

## 2013-06-21 DIAGNOSIS — Z87891 Personal history of nicotine dependence: Secondary | ICD-10-CM

## 2013-06-21 DIAGNOSIS — M25559 Pain in unspecified hip: Secondary | ICD-10-CM | POA: Diagnosis present

## 2013-06-21 DIAGNOSIS — J9601 Acute respiratory failure with hypoxia: Secondary | ICD-10-CM

## 2013-06-21 DIAGNOSIS — A09 Infectious gastroenteritis and colitis, unspecified: Secondary | ICD-10-CM | POA: Diagnosis present

## 2013-06-21 DIAGNOSIS — T68XXXA Hypothermia, initial encounter: Secondary | ICD-10-CM

## 2013-06-21 HISTORY — DX: Personal history of nicotine dependence: Z87.891

## 2013-06-21 LAB — ABO/RH: ABO/RH(D): A POS

## 2013-06-21 LAB — COMPREHENSIVE METABOLIC PANEL
ALT: 18 U/L (ref 0–35)
Albumin: 4.1 g/dL (ref 3.5–5.2)
Albumin: 2.7 g/dL — ABNORMAL LOW (ref 3.5–5.2)
Alkaline Phosphatase: 57 U/L (ref 39–117)
BUN: 16 mg/dL (ref 6–23)
BUN: 12 mg/dL (ref 6–23)
Calcium: 9.7 mg/dL (ref 8.4–10.5)
Creatinine, Ser: 0.49 mg/dL — ABNORMAL LOW (ref 0.50–1.10)
Potassium: 3.2 mEq/L — ABNORMAL LOW (ref 3.5–5.1)
Potassium: 3.5 mEq/L (ref 3.5–5.1)
Sodium: 133 mEq/L — ABNORMAL LOW (ref 135–145)
Total Protein: 7.1 g/dL (ref 6.0–8.3)
Total Protein: 4.8 g/dL — ABNORMAL LOW (ref 6.0–8.3)

## 2013-06-21 LAB — BASIC METABOLIC PANEL
Calcium: 7.8 mg/dL — ABNORMAL LOW (ref 8.4–10.5)
Chloride: 109 mEq/L (ref 96–112)
Creatinine, Ser: 0.52 mg/dL (ref 0.50–1.10)
GFR calc Af Amer: >90 mL/min (ref >90)
Sodium: 136 mEq/L (ref 135–145)

## 2013-06-21 LAB — BLOOD GAS, ARTERIAL
Acid-base deficit: 10.2 mmol/L — ABNORMAL HIGH (ref 0.0–2.0)
FIO2: 1 %
MECHVT: 410 mL
RATE: 15 resp/min
pCO2 arterial: 43.3 mmHg (ref 35.0–45.0)
pH, Arterial: 7.215 — ABNORMAL LOW (ref 7.350–7.450)
pO2, Arterial: 396 mmHg — ABNORMAL HIGH (ref 80.0–100.0)

## 2013-06-21 LAB — TROPONIN I
Troponin I: <0.3 ng/mL (ref <0.30)
Troponin I: <0.3 ng/mL (ref <0.30)

## 2013-06-21 LAB — GLUCOSE, CAPILLARY: Glucose-Capillary: 198 mg/dL — ABNORMAL HIGH (ref 70–99)

## 2013-06-21 LAB — CBC WITH DIFFERENTIAL/PLATELET
Basophils Relative: 0 % (ref 0–1)
Eosinophils Absolute: 0.1 10*3/uL (ref 0.0–0.7)
Eosinophils Relative: 1 % (ref 0–5)
MCH: 30.6 pg (ref 26.0–34.0)
MCHC: 35.2 g/dL (ref 30.0–36.0)
Neutrophils Relative %: 78 % — ABNORMAL HIGH (ref 43–77)
Platelets: 265 10*3/uL (ref 150–400)
RDW: 14.8 % (ref 11.5–15.5)

## 2013-06-21 LAB — URINALYSIS, ROUTINE W REFLEX MICROSCOPIC
Glucose, UA: 500 mg/dL — AB
Hgb urine dipstick: NEGATIVE
Ketones, ur: NEGATIVE mg/dL
Protein, ur: NEGATIVE mg/dL

## 2013-06-21 LAB — TYPE AND SCREEN
ABO/RH(D): A POS
Antibody Screen: NEGATIVE

## 2013-06-21 LAB — CARBOXYHEMOGLOBIN
O2 Saturation: 61.2 %
Total hemoglobin: 12.9 g/dL (ref 12.0–16.0)

## 2013-06-21 LAB — POCT I-STAT, CHEM 8
BUN: 15 mg/dL (ref 6–23)
Chloride: 107 mEq/L (ref 96–112)
Creatinine, Ser: 1 mg/dL (ref 0.50–1.10)
Sodium: 136 mEq/L (ref 135–145)
TCO2: 17 mmol/L (ref 0–100)

## 2013-06-21 LAB — PROTIME-INR
INR: 1.2 (ref 0.00–1.49)
Prothrombin Time: 14.9 seconds (ref 11.6–15.2)

## 2013-06-21 LAB — LACTIC ACID, PLASMA: Lactic Acid, Venous: 2 mmol/L (ref 0.5–2.2)

## 2013-06-21 LAB — URINE MICROSCOPIC-ADD ON

## 2013-06-21 LAB — POCT I-STAT TROPONIN I: Troponin i, poc: 0.01 ng/mL (ref 0.00–0.08)

## 2013-06-21 MED ORDER — FENTANYL CITRATE 0.05 MG/ML IJ SOLN
INTRAMUSCULAR | Status: AC
  Filled 2013-06-21: qty 4

## 2013-06-21 MED ORDER — CHLORHEXIDINE GLUCONATE 0.12 % MT SOLN
15.0000 mL | Freq: Two times a day (BID) | OROMUCOSAL | Status: DC
  Administered 2013-06-21 – 2013-06-22 (×2): 15 mL via OROMUCOSAL
  Filled 2013-06-21 (×2): qty 15

## 2013-06-21 MED ORDER — FENTANYL CITRATE 0.05 MG/ML IJ SOLN
INTRAMUSCULAR | Status: AC | PRN
  Administered 2013-06-21 (×2): 100 ug via INTRAVENOUS

## 2013-06-21 MED ORDER — POTASSIUM CHLORIDE 10 MEQ/50ML IV SOLN
10.0000 meq | INTRAVENOUS | Status: AC
  Administered 2013-06-21 (×4): 10 meq via INTRAVENOUS
  Filled 2013-06-21: qty 50
  Filled 2013-06-21: qty 100
  Filled 2013-06-21 (×3): qty 50

## 2013-06-21 MED ORDER — SODIUM CHLORIDE 0.9 % IV BOLUS (SEPSIS): 1000.0000 mL | INTRAVENOUS | Status: DC | PRN

## 2013-06-21 MED ORDER — HYDROCORTISONE SOD SUCCINATE 100 MG IJ SOLR: 50.0000 mg | Freq: Four times a day (QID) | INTRAMUSCULAR | Status: DC

## 2013-06-21 MED ORDER — MIDAZOLAM HCL 2 MG/2ML IJ SOLN: 2.0000 mg | Freq: Once | INTRAMUSCULAR | Status: DC

## 2013-06-21 MED ORDER — FENTANYL CITRATE 0.05 MG/ML IJ SOLN: 100.0000 ug | Freq: Once | INTRAMUSCULAR | Status: DC

## 2013-06-21 MED ORDER — BIOTENE DRY MOUTH MT LIQD
15.0000 mL | OROMUCOSAL | Status: DC
  Administered 2013-06-21 – 2013-06-22 (×5): 15 mL via OROMUCOSAL

## 2013-06-21 MED ORDER — DOXYCYCLINE HYCLATE 100 MG IV SOLR
100.0000 mg | Freq: Once | INTRAVENOUS | Status: AC
  Administered 2013-06-21: 100 mg via INTRAVENOUS
  Filled 2013-06-21: qty 100

## 2013-06-21 MED ORDER — VANCOMYCIN HCL IN DEXTROSE 1-5 GM/200ML-% IV SOLN
1000.0000 mg | INTRAVENOUS | Status: AC
  Administered 2013-06-21: 1000 mg via INTRAVENOUS
  Filled 2013-06-21: qty 200

## 2013-06-21 MED ORDER — ETOMIDATE 2 MG/ML IV SOLN
INTRAVENOUS | Status: AC
  Filled 2013-06-21: qty 10

## 2013-06-21 MED ORDER — FENTANYL CITRATE 0.05 MG/ML IJ SOLN
INTRAMUSCULAR | Status: AC
  Filled 2013-06-21: qty 2

## 2013-06-21 MED ORDER — SODIUM CHLORIDE 0.9 % IV SOLN
20.0000 ug/h | INTRAVENOUS | Status: DC
  Administered 2013-06-21: 75 ug/h via INTRAVENOUS
  Administered 2013-06-22: 150 ug/h via INTRAVENOUS
  Filled 2013-06-21 (×2): qty 50

## 2013-06-21 MED ORDER — MIDAZOLAM HCL 2 MG/2ML IJ SOLN
INTRAMUSCULAR | Status: AC
  Filled 2013-06-21: qty 2

## 2013-06-21 MED ORDER — SODIUM CHLORIDE 0.9 % IV SOLN
1.0000 mg/h | INTRAVENOUS | Status: DC
  Administered 2013-06-21: 1 mg/h via INTRAVENOUS
  Administered 2013-06-21: 5 mg/h via INTRAVENOUS
  Filled 2013-06-21 (×4): qty 10

## 2013-06-21 MED ORDER — SODIUM CHLORIDE 0.9 % IV SOLN: 250.0000 mL | INTRAVENOUS | Status: DC | PRN

## 2013-06-21 MED ORDER — DEXTROSE 5 % IV SOLN
2.0000 g | Freq: Once | INTRAVENOUS | Status: AC
  Administered 2013-06-21: 2 g via INTRAVENOUS
  Filled 2013-06-21: qty 2

## 2013-06-21 MED ORDER — FAMOTIDINE IN NACL 20-0.9 MG/50ML-% IV SOLN
20.0000 mg | Freq: Two times a day (BID) | INTRAVENOUS | Status: DC
  Administered 2013-06-21 – 2013-06-24 (×6): 20 mg via INTRAVENOUS
  Filled 2013-06-21 (×9): qty 50

## 2013-06-21 MED ORDER — VANCOMYCIN HCL IN DEXTROSE 750-5 MG/150ML-% IV SOLN
750.0000 mg | INTRAVENOUS | Status: DC
  Filled 2013-06-21: qty 150

## 2013-06-21 MED ORDER — MIDAZOLAM HCL 2 MG/2ML IJ SOLN
INTRAMUSCULAR | Status: AC | PRN
  Administered 2013-06-21: 2 mg via INTRAVENOUS

## 2013-06-21 MED ORDER — MIDAZOLAM HCL 2 MG/2ML IJ SOLN
INTRAMUSCULAR | Status: AC
  Filled 2013-06-21: qty 4

## 2013-06-21 MED ORDER — METRONIDAZOLE IN NACL 5-0.79 MG/ML-% IV SOLN
500.0000 mg | Freq: Four times a day (QID) | INTRAVENOUS | Status: DC
  Filled 2013-06-21 (×3): qty 100

## 2013-06-21 MED ORDER — NOREPINEPHRINE BITARTRATE 1 MG/ML IJ SOLN
4.0000 ug/min | INTRAVENOUS | Status: DC
  Administered 2013-06-21: 4 ug/min via INTRAVENOUS
  Filled 2013-06-21: qty 4

## 2013-06-21 MED ORDER — DOPAMINE-DEXTROSE 3.2-5 MG/ML-% IV SOLN
7.0000 ug/kg/min | INTRAVENOUS | Status: DC
  Administered 2013-06-21: 5 ug/kg/min via INTRAVENOUS
  Filled 2013-06-21: qty 250

## 2013-06-21 MED ORDER — METRONIDAZOLE IN NACL 5-0.79 MG/ML-% IV SOLN
500.0000 mg | Freq: Four times a day (QID) | INTRAVENOUS | Status: DC
  Administered 2013-06-21 – 2013-06-24 (×11): 500 mg via INTRAVENOUS
  Filled 2013-06-21 (×15): qty 100

## 2013-06-21 MED ORDER — MIDAZOLAM BOLUS VIA INFUSION
1.0000 mg | INTRAVENOUS | Status: DC | PRN
  Filled 2013-06-21: qty 2

## 2013-06-21 MED ORDER — ETOMIDATE 2 MG/ML IV SOLN
INTRAVENOUS | Status: AC | PRN
  Administered 2013-06-21: 10 mg via INTRAVENOUS

## 2013-06-21 MED ORDER — IOHEXOL 350 MG/ML SOLN
100.0000 mL | Freq: Once | INTRAVENOUS | Status: AC | PRN
  Administered 2013-06-21: 100 mL via INTRAVENOUS

## 2013-06-21 MED ORDER — SODIUM BICARBONATE 8.4 % IV SOLN
INTRAVENOUS | Status: DC
  Administered 2013-06-21: 18:00:00 via INTRAVENOUS
  Filled 2013-06-21 (×2): qty 150

## 2013-06-21 MED ORDER — DOPAMINE-DEXTROSE 3.2-5 MG/ML-% IV SOLN
INTRAVENOUS | Status: AC
  Administered 2013-06-21: 5 ug/kg/min via INTRAVENOUS
  Filled 2013-06-21: qty 250

## 2013-06-21 MED ORDER — HYDROCORTISONE SOD SUCCINATE 100 MG IJ SOLR
50.0000 mg | Freq: Four times a day (QID) | INTRAMUSCULAR | Status: DC
  Administered 2013-06-21 – 2013-06-23 (×6): 50 mg via INTRAVENOUS
  Filled 2013-06-21 (×10): qty 1

## 2013-06-21 NOTE — Procedures (Signed)
Intubation Procedure Note [name] [id number] [birth year]  Procedure: Intubation Indications: Airway protection and maintenance  Procedure Details Consent: Risks of procedure as well as the alternatives and risks of each were explained to the (patient/caregiver).  Consent for procedure obtained. Time Out: Verified patient identification, verified procedure, site/side was marked, verified correct patient position, special equipment/implants available, medications/allergies/relevent history reviewed, required imaging and test results available.  Performed  Maximum sterile technique was used including cap, gloves, gown, hand hygiene and mask.  MAC and 3    Evaluation Hemodynamic Status: Patient was on pressors prior to intubation, BP remained  stable; O2 sats: stable throughout Patient's Current Condition: stable Complications: No apparent complications Patient did tolerate procedure well. Chest X-ray ordered to verify placement.  CXR: tube position low-repostitioned.  Intubation per Dr. Harrell Gave, NP-C [facility] Pgr: [phone] or [phone]   06/21/2013

## 2013-06-21 NOTE — ED Provider Notes (Signed)
CSN: [id number]     Arrival date & time 06/21/13  1142 History   First MD Initiated Contact with Patient 06/21/13 1159     Chief Complaint  Patient presents with  . Nausea  . Emesis  . Hypotension  . Torticollis   (Consider location/radiation/quality/duration/timing/severity/associated sxs/prior Treatment) HPI.....Marland Kitchen level V caveat for urgent need for intervention.   Husband reports normal behavior last night. Today she complained of dizziness, slurred speech, abdominal pain, chills. Husband reports a similar incident approximately 6 months ago where she was admitted to [facility], unknown etiology. She has no heart issues, diabetes, hypertension, nonsmoker. She has arthritis and chronic lumbar pain. She had been hiking a couple days ago                                                                                                                                                                                                                                                                                                                                                                                                                                                    Past Medical History  Diagnosis Date  . Hip pain   . Back pain   . GERD (gastroesophageal reflux disease)   . Hyperthyroidism   . Arthritis   . Restless leg syndrome    Past Surgical History  Procedure  Laterality Date  . Right rotator cuff repair    . Esophageal dilation      X 2  . Bladder suspension     History reviewed. No pertinent family history. History  Substance Use Topics  . Smoking status: Former Smoker    Types: Cigarettes  . Smokeless tobacco: Never Used     Comment: Quit about 20 years before.  . Alcohol Use: No   OB History   Grav Para Term Preterm Abortions TAB SAB Ect Mult Living                 Review of Systems  Unable to perform ROS: Acuity of condition    Allergies  Review of  patient's allergies indicates no known allergies.  Home Medications   Current Outpatient Rx  Name  Route  Sig  Dispense  Refill  . Alpha-Lipoic Acid 100 MG CAPS   Oral   Take 1 capsule by mouth daily.         Marland Kitchen aspirin 325 MG tablet   Oral   Take 1 tablet (325 mg total) by mouth daily.         Marland Kitchen b complex vitamins tablet   Oral   Take 2 tablets by mouth daily.         . bethanechol (URECHOLINE) 25 MG tablet   Oral   Take 25 mg by mouth 3 (three) times daily.         . cholecalciferol (VITAMIN D) 1000 UNITS tablet   Oral   Take 1,000 Units by mouth daily.         . Coenzyme Q10 (COQ-10) 100 MG CAPS   Oral   Take 100 mg by mouth every evening.         . cyclobenzaprine (FLEXERIL) 10 MG tablet   Oral   Take 10 mg by mouth 2 (two) times daily as needed for muscle spasms (for muscle spasams).         . fluticasone (FLONASE) 50 MCG/ACT nasal spray   Nasal   Place 2 sprays into the nose daily.         Marland Kitchen gabapentin (NEURONTIN) 100 MG capsule   Oral   Take 1 capsule (100 mg total) by mouth 3 (three) times daily. 100 mg every morning and at noon, and 200 mg every evening         . HYDROcodone-acetaminophen (NORCO/VICODIN) 5-325 MG per tablet   Oral   Take 1 tablet by mouth every 6 (six) hours as needed for pain (for pain).         Marland Kitchen levothyroxine (SYNTHROID, LEVOTHROID) 50 MCG tablet   Oral   Take 50 mcg by mouth daily.         . Melatonin 3 MG CAPS   Oral   Take 3 mg by mouth at bedtime.         . Multiple Vitamin (MULTIVITAMIN WITH MINERALS) TABS   Oral   Take 1 tablet by mouth daily.         . nabumetone (RELAFEN) 500 MG tablet   Oral   Take 500 mg by mouth 2 (two) times daily.         Marland Kitchen omeprazole (PRILOSEC) 40 MG capsule   Oral   Take 40 mg by mouth every evening.         Marland Kitchen rOPINIRole (REQUIP) 1 MG tablet   Oral   Take 3 mg by mouth 2 (two) times daily.         Marland Kitchen  simvastatin (ZOCOR) 20 MG tablet   Oral   Take 1 tablet  (20 mg total) by mouth daily at 6 PM.   30 tablet   2   . temazepam (RESTORIL) 30 MG capsule   Oral   Take 30 mg by mouth at bedtime.         . traMADol (ULTRAM) 50 MG tablet   Oral   Take 50 mg by mouth 5 (five) times daily.          BP 98/81  Pulse 37  Temp(Src) 93 F (33.9 C) (Rectal)  Ht 5' (1.524 m)  Wt 108 lb (48.988 kg)  BMI 21.09 kg/m2 Physical Exam  Nursing note and vitals reviewed. Constitutional:  Hypotensive, bradycardic, pale, chills  HENT:  Head: Normocephalic and atraumatic.  Eyes: Conjunctivae and EOM are normal. Pupils are equal, round, and reactive to light.  Neck: Normal range of motion. Neck supple.  Questionable pain with flexion.  Cardiovascular: Normal rate, regular rhythm and normal heart sounds.   Pulmonary/Chest: Effort normal and breath sounds normal.  Abdominal: Soft. Bowel sounds are normal.  Musculoskeletal: Normal range of motion.  Neurological:  Moving all her extremities  Skin:  Skin temperature cool  Psychiatric:  Unable    ED Course  Procedures (including critical care time) Labs Review Labs Reviewed  CBC WITH DIFFERENTIAL - Abnormal; Notable for the following:    WBC 15.7 (*)    Neutrophils Relative % 78 (*)    Neutro Abs 12.1 (*)    All other components within normal limits  COMPREHENSIVE METABOLIC PANEL - Abnormal; Notable for the following:    Sodium 133 (*)    Potassium 3.2 (*)    CO2 18 (*)    Glucose, Bld 305 (*)    Total Bilirubin 0.2 (*)    GFR calc non Af Amer 87 (*)    All other components within normal limits  POCT I-STAT, CHEM 8 - Abnormal; Notable for the following:    Potassium 3.3 (*)    Glucose, Bld 279 (*)    All other components within normal limits  CULTURE, BLOOD (ROUTINE X 2)  CULTURE, BLOOD (ROUTINE X 2)  TROPONIN I  URINALYSIS, ROUTINE W REFLEX MICROSCOPIC  POCT I-STAT TROPONIN I   Imaging Review Ct Head Wo Contrast  06/21/2013   CLINICAL DATA:  Nausea, vomiting, hypotension.  EXAM: CT  HEAD WITHOUT CONTRAST  TECHNIQUE: Contiguous axial images were obtained from the base of the skull through the vertex without intravenous contrast.  COMPARISON:  MRI 12/07/2012  FINDINGS: No acute intracranial abnormality. Specifically, no hemorrhage, hydrocephalus, mass lesion, acute infarction, or significant intracranial injury. No acute calvarial abnormality. Visualized paranasal sinuses and mastoids clear. Orbital soft tissues unremarkable.  IMPRESSION: No acute intracranial abnormality.   Electronically Signed   By: Charlett Nose   On: 06/21/2013 12:54   Dg Chest Port 1 View  06/21/2013   CLINICAL DATA:  Bradycardia, nausea.  EXAM: PORTABLE CHEST - 1 VIEW  COMPARISON:  02/25/2005.  FINDINGS: The heart size and mediastinal contours are within normal limits. Both lungs are clear. The visualized skeletal structures are unremarkable.  IMPRESSION: No active disease.   Electronically Signed   By: Charlett Nose   On: 06/21/2013 12:45    Date: 06/21/2013  Rate: 45  Rhythm: sinus brady  QRS Axis: normal  Intervals: normal  ST/T Wave abnormalities: normal  Conduction Disutrbances: none  Narrative Interpretation: unremarkable  CRITICAL CARE Performed by: Donnetta Hutching Total critical care time:  60 Critical care time was exclusive of separately billable procedures and treating other patients. Critical care was necessary to treat or prevent imminent or life-threatening deterioration. Critical care was time spent personally by me on the following activities: development of treatment plan with patient and/or surrogate as well as nursing, discussions with consultants, evaluation of patient's response to treatment, examination of patient, obtaining history from patient or surrogate, ordering and performing treatments and interventions, ordering and review of laboratory studies, ordering and review of radiographic studies, pulse oximetry and re-evaluation of patient's condition.  MDM  No diagnosis  found. Uncertain etiology of symptom complex.  Major concerns are hypotension, bradycardia, hypothermia.   Infectious etiology considered as primary diagnosis.  No obvious abdominal aortic aneurysm on CT scan.  Will initiate vigorous IV hydration, dopamine, IV Rocephin, IV vancomycin, IV doxycycline.  Discussed with cardiologist Dr. Daleen Squibb and critical care Dr. Tyson Alias.  Will need lumbar puncture    Donnetta Hutching, MD 06/21/13 1339

## 2013-06-21 NOTE — Progress Notes (Signed)
Met with spouse outside room. Offered support. Will continue to support as patient is transferred to floors.

## 2013-06-21 NOTE — ED Notes (Signed)
Bed: WA02 Expected date:  Expected time:  Means of arrival:  Comments: N/V/D

## 2013-06-21 NOTE — Progress Notes (Signed)
ANTIBIOTIC CONSULT NOTE - INITIAL  Pharmacy Consult for vancomycin Indication: r/o sepsis  No Known Allergies  Patient Measurements: Height: 5' (152.4 cm) Weight: 108 lb (48.988 kg) IBW/kg (Calculated) : 45.5   Vital Signs: Temp: 93 F (33.9 C) (08/26 1217) Temp src: Rectal (08/26 1217) BP: 98/81 mmHg (08/26 1206) Pulse Rate: 37 (08/26 1206) Intake/Output from previous day:   Intake/Output from this shift:    Labs:  Recent Labs  06/21/13 1215 06/21/13 1247  WBC 15.7*  --   HGB 14.0 14.3  PLT 265  --   CREATININE 0.71 1.00   Estimated Creatinine Clearance: 38.7 ml/min (by C-G formula based on Cr of 1). No results found for this basename: VANCOTROUGH, VANCOPEAK, VANCORANDOM, GENTTROUGH, GENTPEAK, GENTRANDOM, TOBRATROUGH, TOBRAPEAK, TOBRARND, AMIKACINPEAK, AMIKACINTROU, AMIKACIN,  in the last 72 hours   Microbiology: No results found for this or any previous visit (from the past 720 hour(s)).  Medical History: Past Medical History  Diagnosis Date  . Hip pain   . Back pain   . GERD (gastroesophageal reflux disease)   . Hyperthyroidism   . Arthritis   . Restless leg syndrome     Medications:  Scheduled:   Infusions:  . cefTRIAXone (ROCEPHIN)  IV 2 g (06/21/13 1357)  . DOPamine 10 mcg/kg/min (06/21/13 1329)  . doxycycline (VIBRAMYCIN) IV    . metronidazole    . norepinephrine (LEVOPHED) Adult infusion    . vancomycin     PRN:   Assessment: 67 y/o F with possible sepsis, beginning empiric antibiotics.  Received ceftriaxone x 1 dose and doxycycline x 1 dose; beginning metronidazole; pharmacy was asked to dose vancomycin.  Goal Range :  Vancomycin trough 15-20  Plan:  1. Vancomycin 1000 mg IV x 1, then 750 mg IV q24h 2. Follow serum creatinine, cultures, and clinical course.  Check trough at steady-state as necessary. 3. Await orders from CCM for continued gram-negative empiric coverage (ceftriaxone was ordered in ED for one dose only).  Elie Goody,  PharmD, BCPS Pager: [phone] 06/21/2013  2:19 PM

## 2013-06-21 NOTE — ED Notes (Addendum)
Pt was having n/v since this am not feeling well, hypotension, iv 20g rt ac with 500cc of ns by ems. cbg 224 pt denies any diabetes, zofran 4mg  iv pta

## 2013-06-21 NOTE — Procedures (Signed)
Cordis Sheath & Central Venous Catheter Insertion Procedure Note [name] [id number] 21-Mar-1946  Procedure: Insertion of Cordis & Central Venous Catheter Indications: Assessment of intravascular volume, Drug and/or fluid administration and Frequent blood sampling  Procedure Details Consent: Risks of procedure as well as the alternatives and risks of each were explained to the (patient/caregiver).  Consent for procedure obtained. Time Out: Verified patient identification, verified procedure, site/side was marked, verified correct patient position, special equipment/implants available, medications/allergies/relevent history reviewed, required imaging and test results available.  Performed  Maximum sterile technique was used including antiseptics, cap, gloves, gown, hand hygiene, mask and sheet. Skin prep: Chlorhexidine; local anesthetic administered A antimicrobial bonded/coated triple lumen catheter was placed in the right internal jugular vein using the Seldinger technique.  Evaluation Blood flow good Complications: No apparent complications Patient did tolerate procedure well. Chest X-ray ordered to verify placement.  CXR: normal.  Procedure performed under direct supervision of Dr. Tyson Alias and with ultrasound guidance for real time vessel cannulation.       [name], NP-C [facility] Pgr: [phone] or [phone]    06/21/2013, 4:22 PM  perfromed with [place] gudiance May need wire Consent husband  [name]. Tyson Alias, MD, FACP Pgr: [phone] [facility]

## 2013-06-21 NOTE — Procedures (Signed)
Arterial Catheter Insertion Procedure Note KENOSHA DOSTER [id number] [birth year]  Procedure: Insertion of Arterial Catheter  Indications: Blood pressure monitoring and Frequent blood sampling  Procedure Details Consent: Risks of procedure as well as the alternatives and risks of each were explained to the (patient/caregiver).  Consent for procedure obtained. Time Out: Verified patient identification, verified procedure, site/side was marked, verified correct patient position, special equipment/implants available, medications/allergies/relevent history reviewed, required imaging and test results available.  Performed  Maximum sterile technique was used including antiseptics, cap, gloves, gown, hand hygiene, mask and sheet. Skin prep: Chlorhexidine; local anesthetic administered 20 gauge catheter was inserted into right femoral artery using the Seldinger technique.  Evaluation Blood flow good; BP tracing good. Complications: No apparent complications.   Nelda Bucks. 06/21/2013  50/20 shock refractory  Mcarthur Rossetti. Tyson Alias, MD, FACP Pgr: [phone] [facility]

## 2013-06-21 NOTE — H&P (Signed)
PULMONARY  / CRITICAL CARE MEDICINE  Name: [name] MRN: [id number] DOB: 10-31-1945    ADMISSION DATE:  06/21/2013   REFERRING MD :  EDP PRIMARY SERVICE: CCM  CHIEF COMPLAINT:Sepsis   BRIEF PATIENT DESCRIPTION: 67 y/o F who presented to [facility] ER on 8/26 with profound bradycardia (30's), hypothermia (93), hypotension & bloody diarrhea.     SIGNIFICANT EVENTS / STUDIES:  8/26 - admit with shock, bloody diarrhea, profound bradycardia (30's), hypothermia (93), hypotension  LINES / TUBES: Right IJ Cordis/CVC 8/26>>> R Femoral A-line >>> ETT 8/26>>>  CULTURES: Blood Cultures x 2 8/26>>> Sputum for Cx and GS 8/26>>> Stool for C. Diff 8/28 >>>  UA 8/26>>> Stool entric 8/28>>>  ANTIBIOTICS: Rocephin 8/26>>> Doxycycline x 1 dose 8/26 Flagyl 8/26 >>> Vanc 8/26 >>>  HISTORY OF PRESENT ILLNESS:   67 year old caucasion female who was in her usual state of health until 8/26, when she developed abdominal cramping and diarrhea, slurred speech and dizziness per her husband.  Per husband she called him and was unable to clearly speak and he activated EMS.  She was in her ususal state of health on 8/25.  Is known to be an avid hiker but has not been able to as of late due to back pain (followed by Dr. Ollen Bowl).  Notes recent travel to [place], [place] and [place].  No known tick bites.  Admitted to hospital in 11/2012 for ? TIA symptoms.  Since that time has had more frequent headaches.  Normally is chronically constipated in the setting of narcotic use but woke this am with diarrhea.  Husband denies known patient use of drugs / ETOH.   She was transported to [facility] by EMS where she was found to be hypothermic, Temp of 93 F, hypotensive with SBP in the 60's, and bradycardic with HR in the 30's, with a first degree HB.   She required intubation, lining, pressors and bear hugger for stabilization in ER.  No known family history as she is adopted.    PAST MEDICAL HISTORY :  Past Medical History  Diagnosis  Date  . Hip pain   . Back pain   . GERD (gastroesophageal reflux disease)   . Hyperthyroidism   . Arthritis   . Restless leg syndrome    Past Surgical History  Procedure Laterality Date  . Right rotator cuff repair    . Esophageal dilation      X 2  . Bladder suspension     Prior to Admission medications   Medication Sig Start Date End Date Taking? Authorizing Provider  atorvastatin (LIPITOR) 40 MG tablet Take 40 mg by mouth daily.   Yes Historical Provider, MD  bethanechol (URECHOLINE) 50 MG tablet Take by mouth 2 (two) times daily.   Yes Historical Provider, MD  simvastatin (ZOCOR) 80 MG tablet Take 80 mg by mouth at bedtime.   Yes Historical Provider, MD  Alpha-Lipoic Acid 100 MG CAPS Take 1 capsule by mouth daily.    Historical Provider, MD  aspirin 325 MG tablet Take 1 tablet (325 mg total) by mouth daily. 12/08/12   Larey Seat, MD  b complex vitamins tablet Take 2 tablets by mouth daily.    Historical Provider, MD  bethanechol (URECHOLINE) 25 MG tablet Take 25 mg by mouth 3 (three) times daily.    Historical Provider, MD  cholecalciferol (VITAMIN D) 1000 UNITS tablet Take 1,000 Units by mouth daily.    Historical Provider, MD  Coenzyme Q10 (COQ-10) 100 MG  CAPS Take 100 mg by mouth every evening.    Historical Provider, MD  cyclobenzaprine (FLEXERIL) 10 MG tablet Take 10 mg by mouth 2 (two) times daily as needed for muscle spasms (for muscle spasams).    Historical Provider, MD  fluticasone (FLONASE) 50 MCG/ACT nasal spray Place 2 sprays into the nose daily.    Historical Provider, MD  gabapentin (NEURONTIN) 100 MG capsule Take 100-300 mg by mouth 3 (three) times daily. 200 mg every morning and at noon, and 300 mg every evening 12/08/12   Larey Seat, MD  HYDROcodone-acetaminophen (NORCO/VICODIN) 5-325 MG per tablet Take 1 tablet by mouth every 6 (six) hours as needed for pain (for pain).    Historical Provider, MD  levothyroxine (SYNTHROID, LEVOTHROID) 50 MCG tablet Take 50 mcg  by mouth daily.    Historical Provider, MD  Melatonin 3 MG CAPS Take 3 mg by mouth at bedtime.    Historical Provider, MD  Multiple Vitamin (MULTIVITAMIN WITH MINERALS) TABS Take 1 tablet by mouth daily.    Historical Provider, MD  nabumetone (RELAFEN) 500 MG tablet Take 500 mg by mouth 2 (two) times daily.    Historical Provider, MD  omeprazole (PRILOSEC) 40 MG capsule Take 40 mg by mouth every evening.    Historical Provider, MD  rOPINIRole (REQUIP) 1 MG tablet Take 1 mg by mouth 3 (three) times daily as needed.     Historical Provider, MD  temazepam (RESTORIL) 30 MG capsule Take 30 mg by mouth at bedtime.    Historical Provider, MD  traMADol (ULTRAM) 50 MG tablet Take 50 mg by mouth 4 (four) times daily as needed.     Historical Provider, MD   No Known Allergies  FAMILY HISTORY:  History reviewed. No pertinent family history.  SOCIAL HISTORY: She reports that she is an ex-smoker, Quit about 15 years ago. She smoked cigarettes, 1 PPD x 20 years. She has never used smokeless tobacco. She does not drink alcohol or use elicit drugs.    REVIEW OF SYSTEMS:   General: No weight loss,night sweats, fevers, positive  Chills and fatique. Neuro: Alert to person, place and situation. MAEx4, following commands and answering questions appropriately. HEENT: No tooth/dental problems, denies sore throat, no sneezing, itching, ear ache, nasal congestion, post nasal drip. Positive for headaches,hyper-salivation.  CV: Denies chest pain,no lower extremity edema,negative JVD, positive for dizziness,hypotension. GI: Denies heartburn, indigestion,nausea and vomiting. Positive for abdominal pain, bloody diarrhea. Respiratory:Denies productive cough, hematemesis, change in sputum color,wheezing, no chest wall deformity noted. Skin: No rash or lesions noted. GU: No complaints of dysuria, urgency or frequency. MS: Positive arthritis with joint swelling and deformity noted, decreased ROM. Positive for chronic back  pain. Psych: No change in mood or affect, no depression or anxiety, no memory loss   SUBJECTIVE:  Unable  VITAL SIGNS: Temp:  [93 F (33.9 C)] 93 F (33.9 C) (08/26 1217) Pulse Rate:  [37-86] 78 (08/26 1528) Resp:  [13-28] 28 (08/26 1528) BP: (70-98)/(21-81) 70/21 mmHg (08/26 1528) SpO2:  [100 %] 100 % (08/26 1500) FiO2 (%):  [35 %-100 %] 35 % (08/26 1528) Weight:  [108 lb (48.988 kg)] 108 lb (48.988 kg) (08/26 1206)  HEMODYNAMICS:    VENTILATOR SETTINGS: Vent Mode:  [-] PRVC FiO2 (%):  [35 %-100 %] 35 % Set Rate:  [15 bmp-28 bmp] 28 bmp Vt Set:  [410 mL] 410 mL PEEP:  [5 cmH20] 5 cmH20 Plateau Pressure:  [14 cmH20] 14 cmH20  INTAKE / OUTPUT: Intake/Output  None     PHYSICAL EXAMINATION: General:  Lethargic,with tremors, profoundly ill appearing caucation female. Neuro: Awake, Alert and oriented to person, place and situation. Following commands. HEENT: Intact, upper dentures in place   Cardiovascular: S1, S2 noted, no rubs, murmurs noted.Bradycardia per monitor.    Lungs: Clear, diminished in the bases. Abdomen: Soft and flat, hyperactive bowel sounds.Tender to deep palpation. Musculoskeletal:  Joint deformities noted in hands Skin: Intact. No rash noted.  LABS:  CBC Recent Labs     06/21/13  1215  06/21/13  1247  WBC  15.7*   --   HGB  14.0  14.3  HCT  39.8  42.0  PLT  265   --    Coag's No results found for this basename: APTT, INR,  in the last 72 hours BMET Recent Labs     06/21/13  1215  06/21/13  1247  NA  133*  136  K  3.2*  3.3*  CL  100  107  CO2  18*   --   BUN  16  15  CREATININE  0.71  1.00  GLUCOSE  305*  279*   Electrolytes Recent Labs     06/21/13  1215  CALCIUM  9.7   Sepsis Markers No results found for this basename: LACTICACIDVEN, PROCALCITON, O2SATVEN,  in the last 72 hours ABG Recent Labs     06/21/13  1457  PHART  7.215*  PCO2ART  43.3  PO2ART  396.0*   Liver Enzymes Recent Labs     06/21/13  1215  AST   27  ALT  18  ALKPHOS  57  BILITOT  0.2*  ALBUMIN  4.1   Cardiac Enzymes Recent Labs     06/21/13  1215  TROPONINI  <0.30   Glucose No results found for this basename: GLUCAP,  in the last 72 hours  Imaging Ct Head Wo Contrast  06/21/2013   CLINICAL DATA:  Nausea, vomiting, hypotension.  EXAM: CT HEAD WITHOUT CONTRAST  TECHNIQUE: Contiguous axial images were obtained from the base of the skull through the vertex without intravenous contrast.  COMPARISON:  MRI 12/07/2012  FINDINGS: No acute intracranial abnormality. Specifically, no hemorrhage, hydrocephalus, mass lesion, acute infarction, or significant intracranial injury. No acute calvarial abnormality. Visualized paranasal sinuses and mastoids clear. Orbital soft tissues unremarkable.  IMPRESSION: No acute intracranial abnormality.   Electronically Signed   By: Charlett Nose   On: 06/21/2013 12:54   Dg Chest Portable 1 View  06/21/2013   CLINICAL DATA:  Line placement  EXAM: PORTABLE CHEST - 1 VIEW  COMPARISON:  06/21/2013  FINDINGS: Endotracheal tube is within the right mainstem bronchus and should be retracted approximately 3-4 cm. Right central line tip in the SVC. No pneumothorax. Slight increase left basilar opacity, likely atelectasis. Right lung is clear. No visible effusions.  IMPRESSION: Right mainstem intubation. Recommend retracting endotracheal tube 3-4 cm.  Right central line in the SVC. No pneumothorax.  Left base atelectasis.  These results will be called to the ordering clinician or representative by the Radiologist Assistant, and communication documented in the PACS Dashboard.   Electronically Signed   By: Charlett Nose   On: 06/21/2013 15:48   Dg Chest Port 1 View  06/21/2013   CLINICAL DATA:  Bradycardia, nausea.  EXAM: PORTABLE CHEST - 1 VIEW  COMPARISON:  02/25/2005.  FINDINGS: The heart size and mediastinal contours are within normal limits. Both lungs are clear. The visualized skeletal structures are unremarkable.  IMPRESSION: No active disease.   Electronically Signed   By: Charlett Nose   On: 06/21/2013 12:45   Ct Angio Chest Aorta W/cm &/or Wo/cm  06/21/2013   *RADIOLOGY REPORT*  Clinical Data:  Hypotensive, hypothermic  CT ANGIOGRAPHY CHEST, ABDOMEN AND PELVIS  Technique:  Multidetector CT imaging through the chest, abdomen and pelvis was performed using the standard protocol during bolus administration of intravenous contrast.  Multiplanar reconstructed images including MIPs were obtained and reviewed to evaluate the vascular anatomy.  Contrast: OMNIPAQUE IOHEXOL 350 MG/ML SOLN  Comparison:  None.  CTA CHEST  Findings:  No evidence of intramural hematoma.  No evidence of thoracic aortic aneurysm or dissection.  No evidence of pulmonary embolism.  Mild centrilobular emphysematous changes.  No suspicious pulmonary nodules. No pleural effusion or pneumothorax.  Visualized thyroid is unremarkable.  The heart is top normal in size.  No pericardial effusion.  Mild atherosclerotic calcifications of the aortic arch.  No suspicious mediastinal, hilar, or axillary lymphadenopathy.  Mild degenerative changes of the thoracic spine.   Review of the MIP images confirms the above findings.  IMPRESSION: No evidence of thoracic aortic aneurysm or dissection.  No evidence of pulmonary embolism.  Mild emphysematous changes.  CTA ABDOMEN AND PELVIS  Findings:  No evidence of abdominal aortic aneurysm or dissection.  Celiac artery, SMA, and IMA are patent.  Atherosclerotic calcifications of the aorta and branch vessels.  Liver, spleen, pancreas, and adrenal glands are within normal limits.  Gallbladder is contracted.  No intrahepatic or extrahepatic ductal dilatation.  Kidneys enhance symmetrically and are within normal limits.  No hydronephrosis.  No evidence of bowel obstruction.  Appendix is not discretely visualized and may be surgically absent.  Extensive colonic diverticulosis, without evidence of diverticulitis.  Mild mucosal  hyperenhancement involving the terminal ileum (series 5/image 142) and possibly the sigmoid colon (series 5/image 155), possibly reflecting infectious/inflammatory enterocolitis, although without convincing surrounding inflammatory changes.  No abdominopelvic ascites. No free air.  No suspicious abdominopelvic lymphadenopathy.  Uterus and bilateral ovaries are unremarkable.  Focal wall thickening involving the anterior bladder dome (series 5/image 170).  Degenerative changes of the lumbar spine, most prominent at L2-3. Grade 1 anterolisthesis of L5 on S1.   Review of the MIP images confirms the above findings.  IMPRESSION: No evidence of abdominal aortic aneurysm or dissection.  Colonic diverticulosis, without evidence of diverticulitis.  Possible mucosal hyperenhancement involving the terminal ileum and sigmoid colon, equivocal.  Correlate for infectious/inflammatory enterocolitis.  No free air.  Focal wall thickening involving the anterior bladder dome. Cystoscopic correlation is suggested.   Original Report Authenticated By: Charline Bills, M.D.   Ct Angio Abd/pel W/ And/or W/o  06/21/2013   *RADIOLOGY REPORT*  Clinical Data:  Hypotensive, hypothermic  CT ANGIOGRAPHY CHEST, ABDOMEN AND PELVIS  Technique:  Multidetector CT imaging through the chest, abdomen and pelvis was performed using the standard protocol during bolus administration of intravenous contrast.  Multiplanar reconstructed images including MIPs were obtained and reviewed to evaluate the vascular anatomy.  Contrast: OMNIPAQUE IOHEXOL 350 MG/ML SOLN  Comparison:  None.  CTA CHEST  Findings:  No evidence of intramural hematoma.  No evidence of thoracic aortic aneurysm or dissection.  No evidence of pulmonary embolism.  Mild centrilobular emphysematous changes.  No suspicious pulmonary nodules. No pleural effusion or pneumothorax.  Visualized thyroid is unremarkable.  The heart is top normal in size.  No pericardial effusion.  Mild  atherosclerotic calcifications of the aortic arch.  No  suspicious mediastinal, hilar, or axillary lymphadenopathy.  Mild degenerative changes of the thoracic spine.   Review of the MIP images confirms the above findings.  IMPRESSION: No evidence of thoracic aortic aneurysm or dissection.  No evidence of pulmonary embolism.  Mild emphysematous changes.  CTA ABDOMEN AND PELVIS  Findings:  No evidence of abdominal aortic aneurysm or dissection.  Celiac artery, SMA, and IMA are patent.  Atherosclerotic calcifications of the aorta and branch vessels.  Liver, spleen, pancreas, and adrenal glands are within normal limits.  Gallbladder is contracted.  No intrahepatic or extrahepatic ductal dilatation.  Kidneys enhance symmetrically and are within normal limits.  No hydronephrosis.  No evidence of bowel obstruction.  Appendix is not discretely visualized and may be surgically absent.  Extensive colonic diverticulosis, without evidence of diverticulitis.  Mild mucosal hyperenhancement involving the terminal ileum (series 5/image 142) and possibly the sigmoid colon (series 5/image 155), possibly reflecting infectious/inflammatory enterocolitis, although without convincing surrounding inflammatory changes.  No abdominopelvic ascites. No free air.  No suspicious abdominopelvic lymphadenopathy.  Uterus and bilateral ovaries are unremarkable.  Focal wall thickening involving the anterior bladder dome (series 5/image 170).  Degenerative changes of the lumbar spine, most prominent at L2-3. Grade 1 anterolisthesis of L5 on S1.   Review of the MIP images confirms the above findings.  IMPRESSION: No evidence of abdominal aortic aneurysm or dissection.  Colonic diverticulosis, without evidence of diverticulitis.  Possible mucosal hyperenhancement involving the terminal ileum and sigmoid colon, equivocal.  Correlate for infectious/inflammatory enterocolitis.  No free air.  Focal wall thickening involving the anterior bladder dome.  Cystoscopic correlation is suggested.   Original Report Authenticated By: Charline Bills, M.D.     pcxr- ett (will WD 2 cm), line wnl  ASSESSMENT / PLAN:  PULMONARY A: Acute Respiratory Failure - in the setting of metabolic derangement / septic shock. 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 P:   -intubation with full vent support, Tv to 400 -f/u abg & PCXR - rate increase, repeat  -ETT with R mainstem intubation, orders for adjustment placed  CARDIOVASCULAR A:  Shock - thought septic with possible abdominal source but hiking hx raises concern for tertiary lyme disease Bradycardia R/o mycoacrditis R/o ischemia P:  -Septic shock protocol -pressors to support MAP > 65 -dopamine at 7 mcg's without titration for brady, for beta affect -cycle enzymes, CK, Myoglobin  -assess EKG  -cordis placed for potential need of pacing wire with brady -assess cortisol then empiric stress steroids -TSH -Echo Pacer pads chest  RENAL A:   At risk AKI - in setting of GI loss / contrast administration  Gap / Non-Gap Acidosis Hypokalemia  P:   -follow BMP -D5 with Bicarb at 50 ml /hr for NONAG , stools -hydration with NS per EGDT protocol  -replace K, caution with bicarb, wil dc bicarb if drops further  GASTROINTESTINAL A:   Diarrhea  - bloody diarrhea in ER, concern for ischemia vs infectious etiology Abd Pain  P:   -CT Abd / Pelvis done, reviewed -pepcid with concern for infectious etiology (protonix / c-diff), unlikely cdiff -See ID -repeat lft -see ID -NPO -amy, lip  HEMATOLOGIC A:   Leukocytosis P:  -monitor H/H with bloody diarrhea  -follow CBC -HOLD heparin / ASA in setting of bleed, SCD's for DVT proph  INFECTIOUS A:   Diarrhea, r/o E coli, o157, r./;o enterics, shig, salm etc, campilobater Unlikely meningitis, enceph as improved neuro in ED with volume R/o associated RA, inflammatory  unlikley Lyme  P:   -pan culture -abx as above -assess auto immune  panel -ID conuslt  ENDOCRINE A:    R/o hypothyroid, r/o rel AI P:   -assess TSH with brady, no hx of thyroid issues -empiric steroids after cortisol  NEUROLOGIC A:   Chronic Pain - back Pre-Syncope - prior to admit, dizzy, lightheaded.  ??r/t meds vs volume Unlikely meningitis, enceph P:   -hold home pain medications -Sedation protocol for comfort while on vent -??LP in setting of reported neck stiffness (good ROM on exam)- but improved, and hemodynamics do not allow LP at this stage coags also needed  Husband updated multiple taimes, to 2900, may need temp wire [name], NP-C [facility] Pgr: [phone] or [phone]    I have personally obtained a history, examined the patient, evaluated laboratory and imaging results, formulated the assessment and plan and placed orders. CRITICAL CARE: The patient is critically ill with multiple organ systems failure and requires high complexity decision making for assessment and support, frequent evaluation and titration of therapies, application of advanced monitoring technologies and extensive interpretation of multiple databases. Critical Care Time devoted to patient care services described in this note is 90 minutes.   Mcarthur Rossetti. Tyson Alias, MD, FACP Pgr: [phone] [facility]  06/21/2013, 3:56 PM

## 2013-06-21 NOTE — Progress Notes (Signed)
  Echocardiogram 2D Echocardiogram has been performed.  Jorje Guild 06/21/2013, 4:25 PM

## 2013-06-21 NOTE — Procedures (Signed)
Intubation Procedure Note TEIRA ARCILLA [id number] [birth year]  Procedure: Intubation Indications: Respiratory insufficiency  Procedure Details Consent: Risks of procedure as well as the alternatives and risks of each were explained to the (patient/caregiver).  Consent for procedure obtained. Time Out: Verified patient identification, verified procedure, site/side was marked, verified correct patient position, special equipment/implants available, medications/allergies/relevent history reviewed, required imaging and test results available.  Performed  Maximum sterile technique was used including gown, hand hygiene and mask.  MAC and 3    Evaluation Hemodynamic Status: BP unatsanle being treated pressors; O2 sats: stable throughout Patient's Current Condition: unstable Complications: No apparent complications Patient did tolerate procedure well. Chest X-ray ordered to verify placement.  CXR: pending.   Nelda Bucks 06/21/2013

## 2013-06-21 NOTE — Progress Notes (Signed)
Respiratory therapy note- received patient from [facility] and placed on current charted and reported setting. Cont to monitor.

## 2013-06-21 NOTE — Plan of Care (Signed)
Problem: ICU Phase Progression Outcomes Goal: Other ICU Phase Outcomes/Goals Foley

## 2013-06-21 NOTE — ED Notes (Signed)
10mg etomidate given

## 2013-06-21 NOTE — ED Notes (Signed)
Pt from home.  C/O neck pain, diarrhea, low abd pain since this am.  Bright red stool noted on arrival when taken to toilet.  Pt is diaphoretic and unable to register oral temp.

## 2013-06-22 ENCOUNTER — Inpatient Hospital Stay (HOSPITAL_COMMUNITY): Payer: Medicare Other

## 2013-06-22 DIAGNOSIS — I498 Other specified cardiac arrhythmias: Secondary | ICD-10-CM

## 2013-06-22 DIAGNOSIS — G8929 Other chronic pain: Secondary | ICD-10-CM

## 2013-06-22 DIAGNOSIS — K922 Gastrointestinal hemorrhage, unspecified: Secondary | ICD-10-CM

## 2013-06-22 DIAGNOSIS — K5289 Other specified noninfective gastroenteritis and colitis: Secondary | ICD-10-CM

## 2013-06-22 DIAGNOSIS — Z113 Encounter for screening for infections with a predominantly sexual mode of transmission: Secondary | ICD-10-CM

## 2013-06-22 DIAGNOSIS — R197 Diarrhea, unspecified: Secondary | ICD-10-CM

## 2013-06-22 DIAGNOSIS — I959 Hypotension, unspecified: Secondary | ICD-10-CM

## 2013-06-22 DIAGNOSIS — R680 Hypothermia, not associated with low environmental temperature: Secondary | ICD-10-CM

## 2013-06-22 DIAGNOSIS — Z87891 Personal history of nicotine dependence: Secondary | ICD-10-CM

## 2013-06-22 DIAGNOSIS — J9600 Acute respiratory failure, unspecified whether with hypoxia or hypercapnia: Secondary | ICD-10-CM

## 2013-06-22 LAB — BLOOD GAS, ARTERIAL
Acid-base deficit: 5.8 mmol/L — ABNORMAL HIGH (ref 0.0–2.0)
Bicarbonate: 16.8 mEq/L — ABNORMAL LOW (ref 20.0–24.0)
FIO2: 0.4 %
O2 Saturation: 99.9 %
pO2, Arterial: 177 mmHg — ABNORMAL HIGH (ref 80.0–100.0)

## 2013-06-22 LAB — GLUCOSE, CAPILLARY
Glucose-Capillary: 138 mg/dL — ABNORMAL HIGH (ref 70–99)
Glucose-Capillary: 119 mg/dL — ABNORMAL HIGH (ref 70–99)
Glucose-Capillary: 221 mg/dL — ABNORMAL HIGH (ref 70–99)

## 2013-06-22 LAB — MAGNESIUM: Magnesium: 1.9 mg/dL (ref 1.5–2.5)

## 2013-06-22 LAB — POCT I-STAT 3, ART BLOOD GAS (G3+)
O2 Saturation: 99 %
pCO2 arterial: 27.8 mmHg — ABNORMAL LOW (ref 35.0–45.0)
pH, Arterial: 7.411 (ref 7.350–7.450)
pO2, Arterial: 138 mmHg — ABNORMAL HIGH (ref 80.0–100.0)

## 2013-06-22 LAB — BASIC METABOLIC PANEL
BUN: 6 mg/dL (ref 6–23)
BUN: 3 mg/dL — ABNORMAL LOW (ref 6–23)
Chloride: 104 mEq/L (ref 96–112)
Creatinine, Ser: 0.41 mg/dL — ABNORMAL LOW (ref 0.50–1.10)
GFR calc Af Amer: >90 mL/min (ref >90)
GFR calc Af Amer: >90 mL/min (ref >90)
GFR calc non Af Amer: >90 mL/min (ref >90)
Potassium: 3.6 mEq/L (ref 3.5–5.1)

## 2013-06-22 LAB — PROCALCITONIN: Procalcitonin: 0.29 ng/mL

## 2013-06-22 LAB — URINE CULTURE: Culture: NO GROWTH

## 2013-06-22 LAB — LIPASE, BLOOD: Lipase: 168 U/L — ABNORMAL HIGH (ref 11–59)

## 2013-06-22 LAB — CBC
MCH: 30.8 pg (ref 26.0–34.0)
MCHC: 36.3 g/dL — ABNORMAL HIGH (ref 30.0–36.0)
MCV: 85.5 fL (ref 78.0–100.0)
Platelets: 176 10*3/uL (ref 150–400)
Platelets: 191 10*3/uL (ref 150–400)
RDW: 14.8 % (ref 11.5–15.5)
RDW: 15 % (ref 11.5–15.5)

## 2013-06-22 LAB — C-REACTIVE PROTEIN: CRP: 6.1 mg/dL — ABNORMAL HIGH (ref <0.60)

## 2013-06-22 LAB — AMYLASE: Amylase: 376 U/L — ABNORMAL HIGH (ref 0–105)

## 2013-06-22 MED ORDER — ZOLPIDEM TARTRATE 5 MG PO TABS
5.0000 mg | ORAL_TABLET | Freq: Every day | ORAL | Status: DC
  Administered 2013-06-22: 5 mg via ORAL
  Filled 2013-06-22: qty 1

## 2013-06-22 MED ORDER — VANCOMYCIN HCL 500 MG IV SOLR
500.0000 mg | Freq: Two times a day (BID) | INTRAVENOUS | Status: DC
  Administered 2013-06-22: 500 mg via INTRAVENOUS
  Filled 2013-06-22 (×2): qty 500

## 2013-06-22 MED ORDER — POTASSIUM CHLORIDE 20 MEQ/15ML (10%) PO LIQD
ORAL | Status: AC
  Filled 2013-06-22: qty 30

## 2013-06-22 MED ORDER — DEXTROSE 5 % IV SOLN
1.0000 g | INTRAVENOUS | Status: DC
  Administered 2013-06-22 – 2013-06-25 (×4): 1 g via INTRAVENOUS
  Filled 2013-06-22 (×5): qty 10

## 2013-06-22 MED ORDER — ROPINIROLE HCL 1 MG PO TABS
1.0000 mg | ORAL_TABLET | Freq: Every evening | ORAL | Status: DC | PRN
  Administered 2013-06-22: 1 mg via ORAL
  Filled 2013-06-22: qty 1

## 2013-06-22 MED ORDER — SODIUM CHLORIDE 0.9 % IV SOLN
1.0000 g | Freq: Once | INTRAVENOUS | Status: AC
  Administered 2013-06-22: 1 g via INTRAVENOUS
  Filled 2013-06-22: qty 10

## 2013-06-22 MED ORDER — MELATONIN 3 MG PO CAPS: 3.0000 mg | ORAL_CAPSULE | Freq: Every day | ORAL | Status: DC

## 2013-06-22 MED ORDER — POTASSIUM CHLORIDE 20 MEQ/15ML (10%) PO LIQD
40.0000 meq | ORAL | Status: AC
  Administered 2013-06-22 (×3): 40 meq
  Filled 2013-06-22 (×3): qty 30

## 2013-06-22 MED ORDER — TEMAZEPAM 15 MG PO CAPS: 15.0000 mg | ORAL_CAPSULE | Freq: Every day | ORAL | Status: DC

## 2013-06-22 MED ORDER — BIOTENE DRY MOUTH MT LIQD
15.0000 mL | Freq: Two times a day (BID) | OROMUCOSAL | Status: DC
  Administered 2013-06-22 – 2013-06-24 (×5): 15 mL via OROMUCOSAL

## 2013-06-22 MED ORDER — MAGNESIUM SULFATE 40 MG/ML IJ SOLN
4.0000 g | Freq: Once | INTRAMUSCULAR | Status: AC
  Administered 2013-06-22: 4 g via INTRAVENOUS
  Filled 2013-06-22: qty 100

## 2013-06-22 MED ORDER — TRAMADOL HCL 50 MG PO TABS
50.0000 mg | ORAL_TABLET | Freq: Four times a day (QID) | ORAL | Status: DC | PRN
  Administered 2013-06-22 – 2013-06-23 (×2): 50 mg via ORAL
  Filled 2013-06-22 (×2): qty 1

## 2013-06-22 MED ORDER — LEVOTHYROXINE SODIUM 50 MCG PO TABS
50.0000 ug | ORAL_TABLET | Freq: Every day | ORAL | Status: DC
  Filled 2013-06-22 (×2): qty 1

## 2013-06-22 MED ORDER — HYDROCODONE-ACETAMINOPHEN 5-325 MG PO TABS
1.0000 | ORAL_TABLET | Freq: Four times a day (QID) | ORAL | Status: DC | PRN
  Administered 2013-06-22 – 2013-06-24 (×3): 1 via ORAL
  Filled 2013-06-22 (×4): qty 1

## 2013-06-22 NOTE — Progress Notes (Signed)
CRITICAL VALUE ALERT  Critical value received: pot 2.2.  CA 6.2  Date of notification:  8/27  Time of notification:  0600  Critical value read back:yes  Nurse who received alert: chris  MD notified (1st page):  [email or web address]  Time of first page:  0610  MD notified (2nd page):  Time of second page:  Responding MD: Deterding  Time MD responded:  [phone]

## 2013-06-22 NOTE — Progress Notes (Signed)
ANTIBIOTIC CONSULT NOTE - FOLLOW UP  Pharmacy Consult for vancomycin Indication: R/o sepsis  No Known Allergies  Patient Measurements: Height: 5' (152.4 cm) Weight: 125 lb 3.5 oz (56.8 kg) IBW/kg (Calculated) : 45.5  Vital Signs: Temp: 99.3 F (37.4 C) (08/27 0700) BP: 84/32 mmHg (08/27 0728) Pulse Rate: 101 (08/27 0728) Intake/Output from previous day: 08/26 0701 - 08/27 0700 In: 7476.8 [I.V.:766.8; IV Piggyback:6710] Out: 4300 [Urine:4300] Intake/Output from this shift:    Labs:  Recent Labs  06/21/13 1215 06/21/13 1247 06/21/13 2205 06/21/13 2255 06/22/13 0020 06/22/13 0500  WBC 15.7*  --   --   --  9.4 9.3  HGB 14.0 14.3  --   --  10.6* 10.7*  PLT 265  --   --   --  191 176  CREATININE 0.71 1.00 0.52 0.49*  --  0.41*   Estimated Creatinine Clearance: 53.1 ml/min (by C-G formula based on Cr of 0.41). No results found for this basename: VANCOTROUGH, VANCOPEAK, VANCORANDOM, GENTTROUGH, GENTPEAK, GENTRANDOM, TOBRATROUGH, TOBRAPEAK, TOBRARND, AMIKACINPEAK, AMIKACINTROU, AMIKACIN,  in the last 72 hours     Assessment: 67 yo female on septic shock protocol (on dopamine and norep) receiving vancomycin/flagyl for r/o sepsis and concern for C. Diff. SCr= 0.41 (close to baseline 0.51-0.57 11/2012)  Rocephin 8/26>> Doxycycline x 1 dose 8/26  Flagyl 8/26 >>>  Vanc 8/26 >>>  Blood Cultures x 2 8/26>>>  Stool for C. Diff 8/28 >>>  8/27 resp Stool entric 8/28>>> 8/26 urine  Goal of Therapy:  Vancomycin trough level 15-20 mcg/ml  Plan: -Change vancomycin to 500mg  IV q12h -Will check a vancomycin trough prior to 3rd or 4th dose -Will follow renal function, cultures and clinical progress  Harland German, Pharm D 06/22/2013 8:34 AM

## 2013-06-22 NOTE — Care Management Note (Signed)
    Page 1 of 1   06/22/2013     8:39:08 AM   CARE MANAGEMENT NOTE 06/22/2013  Patient:  [name], [name]   Account Number:  [id number]  Date Initiated:  06/22/2013  Documentation initiated by:  Junius Creamer  Subjective/Objective Assessment:   adm w septic shock,vent     Action/Plan:   lives w husband, pcp dr Molly Maduro dough   Anticipated DC Date:     Anticipated DC Plan:        DC Planning Services  CM consult      Choice offered to / List presented to:             Status of service:   Medicare Important Message given?   (If response is "NO", the following Medicare IM given date fields will be blank) Date Medicare IM given:   Date Additional Medicare IM given:    Discharge Disposition:    Per UR Regulation:  Reviewed for med. necessity/level of care/duration of stay  If discussed at Long Length of Stay Meetings, dates discussed:    Comments:

## 2013-06-22 NOTE — Consult Note (Signed)
INFECTIOUS DISEASE ATTENDING ADDENDUM:     [facility]   Date: 06/22/2013  Patient name: [name]  Medical record number: [id number]  Date of birth: [birth year]    This patient has been seen and discussed with the house staff. Please see their note for complete details. I concur with their findings with the following additions/corrections:  Agree with excellent MS IV note above. Patient has improved dramaticaly overnight. She has had TWO loose (one bloody per husband0 and other maroon colored BM in addition to her ssx of confusion, dizziness, nausea.   Her acute presentation with N, V and hypothermia, hypotension are all concerning for Sepsis and her CT showing terminal ileum inflammation along with colonic involvement would suggest an enteric cause of her acute illness.   Agree with stool pathogen panel. She also does have well water though Giardia would not typically present with bloody bm and shock  I would continue her IV rocephin, flagyl, dc vancomycin  Fu blood cultures, stool panel and cultures (making sure giardia also on the panel), otherwise also check giardia EIA  Will check HIV  It is possible this could be IBD but the presentation with shock doesn't fit that well at tall  I don't think her partial RBBB and bradycardia are duet to Borrelia (Lyme) and dont think tick borne infection is in play here. Salmonella has also interestingly been known to have the bradycardia parodoxical reaction with infection    Acey Lav 06/22/2013, 3:54 PM

## 2013-06-22 NOTE — Procedures (Signed)
i perfromed with mach 3

## 2013-06-22 NOTE — Progress Notes (Signed)
PULMONARY  / CRITICAL CARE MEDICINE  Name: [name] MRN: [id number] DOB: 1945/12/14    ADMISSION DATE:  06/21/2013   REFERRING MD :  EDP PRIMARY SERVICE: CCM  CHIEF COMPLAINT:Sepsis   BRIEF PATIENT DESCRIPTION: 67 y/o F who presented to [facility] ER on 8/26 with profound bradycardia (30's), hypothermia (93), hypotension & bloody diarrhea.    SIGNIFICANT EVENTS / STUDIES:  8/26 - admit with shock, bloody diarrhea, profound bradycardia (30's), hypothermia (93), hypotension 8/26 echo Pa pressure 51, ef wnl, grade 1 diastolic 8/27- off pressors, awake, on low O2 needs  LINES / TUBES: Right IJ Cordis/CVC 8/26>>> R Femoral A-line >>> ETT 8/26>>>  CULTURES: Blood Cultures x 2 8/26>>> Sputum for Cx and GS 8/26>>> Stool for C. Diff 8/28 >>>  UA 8/26>>> Stool entric 8/28>>>  ANTIBIOTICS: Rocephin 8/26>>> Doxycycline x 1 dose 8/26 Flagyl 8/26 >>> Vanc 8/26 >>>  SUBJECTIVE:  Vent needs low, off pressors  VITAL SIGNS: Temp:  [93 F (33.9 C)-99.3 F (37.4 C)] 99.3 F (37.4 C) (08/27 0700) Pulse Rate:  [37-111] 101 (08/27 0728) Resp:  [13-28] 18 (08/27 0728) BP: (66-153)/(18-81) 84/32 mmHg (08/27 0728) SpO2:  [100 %] 100 % (08/27 0728) Arterial Line BP: (94-178)/(45-80) 94/45 mmHg (08/27 0700) FiO2 (%):  [30 %-100 %] 30 % (08/27 0728) Weight:  [48.988 kg (108 lb)-56.8 kg (125 lb 3.5 oz)] 56.8 kg (125 lb 3.5 oz) (08/27 0453)  HEMODYNAMICS: CVP:  [1 mmHg-10 mmHg] 5 mmHg  VENTILATOR SETTINGS: Vent Mode:  [-] PRVC FiO2 (%):  [30 %-100 %] 30 % Set Rate:  [15 bmp-28 bmp] 18 bmp Vt Set:  [410 mL] 410 mL PEEP:  [5 cmH20] 5 cmH20 Plateau Pressure:  [13 cmH20-16 cmH20] 13 cmH20  INTAKE / OUTPUT: Intake/Output     08/26 0701 - 08/27 0700 08/27 0701 - 08/28 0700   I.V. (mL/kg) 766.8 (13.5)    IV Piggyback 6710    Total Intake(mL/kg) 7476.8 (131.6)    Urine (mL/kg/hr) 4300    Total Output 4300     Net +3176.8            PHYSICAL EXAMINATION: General:  Awake, alert,  nonfocal Neuro: Awake, Alert and oriented to person, place and situation. Following commands. HEENT: jvd up Cardiovascular: S1, S2 noted, no rubs, murmurs noted.Bradycardia resolved Lungs: Clear Abdomen: Soft and flat, hyperactive bowel sounds.Tender resolved Musculoskeletal:  Joint deformities noted in hands Skin: Intact. No rash noted.  LABS:  CBC Recent Labs     06/21/13  1215  06/21/13  1247  06/22/13  0020  06/22/13  0500  WBC  15.7*   --   9.4  9.3  HGB  14.0  14.3  10.6*  10.7*  HCT  39.8  42.0  29.4*  29.5*  PLT  265   --   191  176   Coag's Recent Labs     06/21/13  2255  APTT  26  INR  1.20   BMET Recent Labs     06/21/13  2205  06/21/13  2255  06/22/13  0500  NA  136  136  137  K  3.6  3.5  2.2*  CL  109  110  110  CO2  18*  17*  16*  BUN  13  12  6   CREATININE  0.52  0.49*  0.41*  GLUCOSE  155*  131*  136*   Electrolytes Recent Labs     06/21/13  2205  06/21/13  2255  06/22/13  0020  06/22/13  0500  CALCIUM  7.8*  7.3*   --   6.2*  MG   --   1.9  1.4*   --    Sepsis Markers Recent Labs     06/21/13  2255  PROCALCITON  0.29   ABG Recent Labs     06/21/13  1457  06/22/13  0520  06/22/13  0636  PHART  7.215*  7.507*  7.436  PCO2ART  43.3  21.4*  27.8*  PO2ART  396.0*  177.0*  138.0*   Liver Enzymes Recent Labs     06/21/13  1215  06/21/13  2255  AST  27  40*  ALT  18  34  ALKPHOS  57  34*  BILITOT  0.2*  0.1*  ALBUMIN  4.1  2.7*   Cardiac Enzymes Recent Labs     06/21/13  1215  06/21/13  2205  TROPONINI  <0.30  <0.30   Glucose Recent Labs     06/21/13  2048  06/22/13  0005  06/22/13  0402  GLUCAP  198*  114*  139*    Imaging Ct Head Wo Contrast  06/21/2013   CLINICAL DATA:  Nausea, vomiting, hypotension.  EXAM: CT HEAD WITHOUT CONTRAST  TECHNIQUE: Contiguous axial images were obtained from the base of the skull through the vertex without intravenous contrast.  COMPARISON:  MRI 12/07/2012  FINDINGS: No acute  intracranial abnormality. Specifically, no hemorrhage, hydrocephalus, mass lesion, acute infarction, or significant intracranial injury. No acute calvarial abnormality. Visualized paranasal sinuses and mastoids clear. Orbital soft tissues unremarkable.  IMPRESSION: No acute intracranial abnormality.   Electronically Signed   By: Charlett Nose   On: 06/21/2013 12:54   Dg Chest Port 1 View  06/22/2013   *RADIOLOGY REPORT*  Clinical Data: Evaluate lung fields, endotracheal tube position  PORTABLE CHEST - 1 VIEW  Comparison: Prior chest x-ray 06/21/2013  Findings: Although the endotracheal tube has will been withdrawn slightly, the tip remains at the level of the carina and just within the right mainstem bronchus.  Recommend withdrawing at 3 cm for more optimal positioning.  Stable right IJ vascular sheath with the tip in the mid SVC.  The nasogastric tube tip projects over the gastric fundus in the left upper quadrant.  Nonspecific left basilar opacity with some evidence of volume loss, fever atelectasis.  No pulmonary edema.  Stable cardiac and mediastinal contours.  Atherosclerotic calcifications noted in the transverse aorta.  No acute osseous abnormality.  IMPRESSION:  1.  The tip of the endotracheal tube remains at the level of the carina and is directed just into the right mainstem bronchus. Recommend withdrawing 3 cm for more optimal positioning.  2.  Nasogastric tube has been placed.  The tip is within the gastric fundus.  3.  Left basilar atelectasis versus infiltrate, atelectasis is favored.  These results were called by telephone on 05/22/2013 at 07:55 a.m. to the patient's nurse, Raynelle Fanning, who verbally acknowledged these results.   Original Report Authenticated By: Malachy Moan, M.D.   Dg Chest Portable 1 View  06/21/2013   CLINICAL DATA:  Line placement  EXAM: PORTABLE CHEST - 1 VIEW  COMPARISON:  06/21/2013  FINDINGS: Endotracheal tube is within the right mainstem bronchus and should be retracted  approximately 3-4 cm. Right central line tip in the SVC. No pneumothorax. Slight increase left basilar opacity, likely atelectasis. Right lung is clear. No visible effusions.  IMPRESSION: Right mainstem intubation. Recommend retracting endotracheal tube 3-4  cm.  Right central line in the SVC. No pneumothorax.  Left base atelectasis.  These results will be called to the ordering clinician or representative by the Radiologist Assistant, and communication documented in the PACS Dashboard.   Electronically Signed   By: Charlett Nose   On: 06/21/2013 15:48   Dg Chest Port 1 View  06/21/2013   CLINICAL DATA:  Bradycardia, nausea.  EXAM: PORTABLE CHEST - 1 VIEW  COMPARISON:  02/25/2005.  FINDINGS: The heart size and mediastinal contours are within normal limits. Both lungs are clear. The visualized skeletal structures are unremarkable.  IMPRESSION: No active disease.   Electronically Signed   By: Charlett Nose   On: 06/21/2013 12:45   Ct Angio Chest Aorta W/cm &/or Wo/cm  06/21/2013   *RADIOLOGY REPORT*  Clinical Data:  Hypotensive, hypothermic  CT ANGIOGRAPHY CHEST, ABDOMEN AND PELVIS  Technique:  Multidetector CT imaging through the chest, abdomen and pelvis was performed using the standard protocol during bolus administration of intravenous contrast.  Multiplanar reconstructed images including MIPs were obtained and reviewed to evaluate the vascular anatomy.  Contrast: OMNIPAQUE IOHEXOL 350 MG/ML SOLN  Comparison:  None.  CTA CHEST  Findings:  No evidence of intramural hematoma.  No evidence of thoracic aortic aneurysm or dissection.  No evidence of pulmonary embolism.  Mild centrilobular emphysematous changes.  No suspicious pulmonary nodules. No pleural effusion or pneumothorax.  Visualized thyroid is unremarkable.  The heart is top normal in size.  No pericardial effusion.  Mild atherosclerotic calcifications of the aortic arch.  No suspicious mediastinal, hilar, or axillary lymphadenopathy.  Mild  degenerative changes of the thoracic spine.   Review of the MIP images confirms the above findings.  IMPRESSION: No evidence of thoracic aortic aneurysm or dissection.  No evidence of pulmonary embolism.  Mild emphysematous changes.  CTA ABDOMEN AND PELVIS  Findings:  No evidence of abdominal aortic aneurysm or dissection.  Celiac artery, SMA, and IMA are patent.  Atherosclerotic calcifications of the aorta and branch vessels.  Liver, spleen, pancreas, and adrenal glands are within normal limits.  Gallbladder is contracted.  No intrahepatic or extrahepatic ductal dilatation.  Kidneys enhance symmetrically and are within normal limits.  No hydronephrosis.  No evidence of bowel obstruction.  Appendix is not discretely visualized and may be surgically absent.  Extensive colonic diverticulosis, without evidence of diverticulitis.  Mild mucosal hyperenhancement involving the terminal ileum (series 5/image 142) and possibly the sigmoid colon (series 5/image 155), possibly reflecting infectious/inflammatory enterocolitis, although without convincing surrounding inflammatory changes.  No abdominopelvic ascites. No free air.  No suspicious abdominopelvic lymphadenopathy.  Uterus and bilateral ovaries are unremarkable.  Focal wall thickening involving the anterior bladder dome (series 5/image 170).  Degenerative changes of the lumbar spine, most prominent at L2-3. Grade 1 anterolisthesis of L5 on S1.   Review of the MIP images confirms the above findings.  IMPRESSION: No evidence of abdominal aortic aneurysm or dissection.  Colonic diverticulosis, without evidence of diverticulitis.  Possible mucosal hyperenhancement involving the terminal ileum and sigmoid colon, equivocal.  Correlate for infectious/inflammatory enterocolitis.  No free air.  Focal wall thickening involving the anterior bladder dome. Cystoscopic correlation is suggested.   Original Report Authenticated By: Charline Bills, M.D.   Ct Angio Abd/pel W/ And/or  W/o  06/21/2013   *RADIOLOGY REPORT*  Clinical Data:  Hypotensive, hypothermic  CT ANGIOGRAPHY CHEST, ABDOMEN AND PELVIS  Technique:  Multidetector CT imaging through the chest, abdomen and pelvis was performed using the standard  protocol during bolus administration of intravenous contrast.  Multiplanar reconstructed images including MIPs were obtained and reviewed to evaluate the vascular anatomy.  Contrast: OMNIPAQUE IOHEXOL 350 MG/ML SOLN  Comparison:  None.  CTA CHEST  Findings:  No evidence of intramural hematoma.  No evidence of thoracic aortic aneurysm or dissection.  No evidence of pulmonary embolism.  Mild centrilobular emphysematous changes.  No suspicious pulmonary nodules. No pleural effusion or pneumothorax.  Visualized thyroid is unremarkable.  The heart is top normal in size.  No pericardial effusion.  Mild atherosclerotic calcifications of the aortic arch.  No suspicious mediastinal, hilar, or axillary lymphadenopathy.  Mild degenerative changes of the thoracic spine.   Review of the MIP images confirms the above findings.  IMPRESSION: No evidence of thoracic aortic aneurysm or dissection.  No evidence of pulmonary embolism.  Mild emphysematous changes.  CTA ABDOMEN AND PELVIS  Findings:  No evidence of abdominal aortic aneurysm or dissection.  Celiac artery, SMA, and IMA are patent.  Atherosclerotic calcifications of the aorta and branch vessels.  Liver, spleen, pancreas, and adrenal glands are within normal limits.  Gallbladder is contracted.  No intrahepatic or extrahepatic ductal dilatation.  Kidneys enhance symmetrically and are within normal limits.  No hydronephrosis.  No evidence of bowel obstruction.  Appendix is not discretely visualized and may be surgically absent.  Extensive colonic diverticulosis, without evidence of diverticulitis.  Mild mucosal hyperenhancement involving the terminal ileum (series 5/image 142) and possibly the sigmoid colon (series 5/image 155), possibly  reflecting infectious/inflammatory enterocolitis, although without convincing surrounding inflammatory changes.  No abdominopelvic ascites. No free air.  No suspicious abdominopelvic lymphadenopathy.  Uterus and bilateral ovaries are unremarkable.  Focal wall thickening involving the anterior bladder dome (series 5/image 170).  Degenerative changes of the lumbar spine, most prominent at L2-3. Grade 1 anterolisthesis of L5 on S1.   Review of the MIP images confirms the above findings.  IMPRESSION: No evidence of abdominal aortic aneurysm or dissection.  Colonic diverticulosis, without evidence of diverticulitis.  Possible mucosal hyperenhancement involving the terminal ileum and sigmoid colon, equivocal.  Correlate for infectious/inflammatory enterocolitis.  No free air.  Focal wall thickening involving the anterior bladder dome. Cystoscopic correlation is suggested.   Original Report Authenticated By: Charline Bills, M.D.     pcxr- ett (will WD 2 cm), line wnl  ASSESSMENT / PLAN:  PULMONARY A: Acute Respiratory Failure - in the setting of metabolic derangement / septic shock. 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-improved P:   -adjust ETT out 3 cm -wean cpap 5 ps 5, goal 30 min -Dc versed drips -pcxr in am for infiltrate development  CARDIOVASCULAR A:  Shock - thought septic with possible abdominal source but hiking hx raises concern for tertiary lyme disease Bradycardia-resolved R/o mycoacrditis, not noted on echo R/o ischemia, none noted R/o auto immune pulm HTN driven P:  -Septic shock protocol - received -pressors to support MAP > 65 -assess cortisol pending continue empiric stress steroids -TSH pending -Echo reviewed, will need work up pulm pressures, hiv sent, concern would be autoimmune in nature given history arthritis and possible RA per husband -kvo today -see auto immune labs sent  RENAL A:   At risk AKI - in setting of GI loss / contrast administration  Gap /  Non-Gap Acidosis Hypokalemia severe hypomag Low calcium with correction P:   -follow BMP in pm -D5 with Bicarb at 50 ml /h, if k not rising, will dc -hydration with NS per EGDT protocol  -  replace K, aggressive  GASTROINTESTINAL A:   Diarrhea  - bloody diarrhea in ER, concern for ischemia vs infectious etiology Abd Pain  P:   -pepcid -repeat lft -NPO, remain ett , then consider trickel  HEMATOLOGIC A:   Leukocytosis P:  -follow CBC in am further -HOLD heparin / ASA in setting of bleed, SCD's for DVT proph  INFECTIOUS A:   Diarrhea, r/o E coli, o157, r./;o enterics, shig, salm etc, campilobater Unlikely meningitis, enceph as improved neuro in ED with volume R/o associated RA, inflammatory  unlikley Lyme P:   -pan culture -abx as above -assess auto immune panel-pending -ID conuslted  ENDOCRINE A:    R/o hypothyroid, r/o rel AI P:   -tsh awaited -empiric steroids   NEUROLOGIC A:   Chronic Pain - back Pre-Syncope - prior to admit, dizzy, lightheaded.  ??r/t meds vs volume Unlikely meningitis, enceph P:   -hold home pain medications -Sedation protocol for comfort while on vent, dc versed drip Limit fent wua   I have personally obtained a history, examined the patient, evaluated laboratory and imaging results, formulated the assessment and plan and placed orders. CRITICAL CARE: The patient is critically ill with multiple organ systems failure and requires high complexity decision making for assessment and support, frequent evaluation and titration of therapies, application of advanced monitoring technologies and extensive interpretation of multiple databases. Critical Care Time devoted to patient care services described in this note is 35 minutes.   Mcarthur Rossetti. Tyson Alias, MD, FACP Pgr: [phone] [facility]  06/22/2013, 8:11 AM

## 2013-06-22 NOTE — Consult Note (Signed)
[facility]    Date of Admission:  06/21/2013  Date of Consult:  06/22/2013  Reason for Consult: Septic shock Referring Physician: Dr. Rory Percy   Brief narrative: [name] is an 67 y.o. female. with PMH arthritis, chronic lumbar/hip pain (on gabapentin and norco at home), hyperthyroid, RLS (on requip, relafen at home), and former tobacco user who presented to [facility] ED via EMS when she called her husband with confusion and slurred speech, he proceeded to notify EMS on her behalf. The patient also had N/V, lower right abdominal pain, bloody diarrhea (1 episode at home morning of admission), sweating, and chills. The patient and her husband state an episode of transient AMS (slurred speech/dizziness) happened in February 2014 for which she was admitted to the hospital and worked up for TIA vs. Gabapentin side effect. Her symptoms resolved quickly at that time with no deficits however. The patient states she was in her usual state of health the day prior to admission. She is typically constipated 2/2 her norco, so her diarrhea is out of her norm, especially the bloody diarrhea she complained of. On further interview with the patient and her husband this morning she has had a long history of tick exposure over the years from her dogs and from working in her garden as well. Of note, she does now endorse that she did find a tick on her approximately 2-3 weeks ago and "flicked it off" and says it was removed "shortly after working in the garden." She rarely uses tweasers to remove the ticks and states that she has had ticks on her "for many years" and just always "pulls them off." This was confirmed by her husband present at bedside for the h/o of ticks. He does state he was not aware of the tick exposure 2-3 weeks ago however, as he would have otherwise shared that information upon admission.  Upon admission, the patient was found to be hypotensive, bradycardic, and  hypothermic. She was started on IVF and dopamine and was unable to protect her airway and required ETT.  The patient is doing well this morning and is extubated and sitting up in bed in no distress and her neck pain has improved as well as her lower abdominal pain. She denies any HA, fevers, chills, sweats, rashes, N/V/D. Also denies going out to eat to any restaurants recently or to any friends houses to eat, and she mainly cooks at home.   Past Medical History  Diagnosis Date  . Hip pain   . Back pain   . GERD (gastroesophageal reflux disease)   . Hyperthyroidism   . Arthritis   . Restless leg syndrome   . Ex-smoker Quit 15 years ago    Smoked 1 PPD x 20 years    Past Surgical History  Procedure Laterality Date  . Right rotator cuff repair    . Esophageal dilation      X 2  . Bladder suspension    ergies:   No Known Allergies   Medications: I have reviewed patients current medications as documented in Epic Anti-infectives   Start     Dose/Rate Route Frequency Ordered Stop   06/22/13 1400  vancomycin (VANCOCIN) IVPB 750 mg/150 ml premix  Status:  Discontinued     750 mg 150 mL/hr over 60 Minutes Intravenous Every 24 hours 06/21/13 1422 06/22/13 0839   06/22/13 1400  cefTRIAXone (ROCEPHIN) 1 g in dextrose 5 % 50 mL IVPB  1 g 100 mL/hr over 30 Minutes Intravenous Every 24 hours 06/22/13 0839     06/22/13 1000  vancomycin (VANCOCIN) 500 mg in sodium chloride 0.9 % 100 mL IVPB     500 mg 100 mL/hr over 60 Minutes Intravenous Every 12 hours 06/22/13 0839     06/21/13 2000  metroNIDAZOLE (FLAGYL) IVPB 500 mg     500 mg 100 mL/hr over 60 Minutes Intravenous Every 6 hours 06/21/13 1843     06/21/13 1400  doxycycline (VIBRAMYCIN) 100 mg in dextrose 5 % 250 mL IVPB     100 mg 125 mL/hr over 120 Minutes Intravenous  Once 06/21/13 1332 06/21/13 1815   06/21/13 1400  vancomycin (VANCOCIN) IVPB 1000 mg/200 mL premix     1,000 mg 200 mL/hr over 60 Minutes Intravenous STAT 06/21/13  1357 06/21/13 1918   06/21/13 1400  metroNIDAZOLE (FLAGYL) IVPB 500 mg  Status:  Discontinued     500 mg 100 mL/hr over 60 Minutes Intravenous 4 times per day 06/21/13 1358 06/21/13 1843   06/21/13 1345  cefTRIAXone (ROCEPHIN) 2 g in dextrose 5 % 50 mL IVPB     2 g 100 mL/hr over 30 Minutes Intravenous  Once 06/21/13 1331 06/21/13 1425      Social History:  reports that she has quit smoking. Her smoking use included Cigarettes. She smoked 0.00 packs per day. She has never used smokeless tobacco. She reports that she does not drink alcohol or use illicit drugs.  History reviewed. No pertinent family history.  As in HPI and primary teams notes otherwise 12 point review of systems is negative  Blood pressure 84/32, pulse 101, temperature 99.3 F (37.4 C), temperature source Core (Comment), resp. rate 18, height 5' (1.524 m), weight 56.8 kg (125 lb 3.5 oz), SpO2 100.00%. General: Alert and awake, oriented x3, not in any acute distress HEENT: anicteric sclera, pupils reactive to light and accommodation, EOMI, oropharynx clear and without exudate, hoarse voice CVS regular rate, normal r,  no murmur rubs or gallops Chest: clear to auscultation bilaterally, no wheezing, rales or rhonchi Abdomen: soft, mild TTP RLQ, nondistended, normal bowel sounds, Extremities: no  clubbing or edema noted bilaterally Skin: no rashes Neuro: nonfocal, strength and sensation intact   Results for orders placed during the hospital encounter of 06/21/13 (from the past 48 hour(s))  CBC WITH DIFFERENTIAL     Status: Abnormal   Collection Time    06/21/13 12:15 PM      Result Value Range   WBC 15.7 (*) 4.0 - 10.5 K/uL   RBC 4.58  3.87 - 5.11 MIL/uL   Hemoglobin 14.0  12.0 - 15.0 g/dL   HCT 16.1  09.6 - 04.5 %   MCV 86.9  78.0 - 100.0 fL   MCH 30.6  26.0 - 34.0 pg   MCHC 35.2  30.0 - 36.0 g/dL   RDW 40.9  81.1 - 91.4 %   Platelets 265  150 - 400 K/uL   Neutrophils Relative % 78 (*) 43 - 77 %   Neutro Abs  12.1 (*) 1.7 - 7.7 K/uL   Lymphocytes Relative 15  12 - 46 %   Lymphs Abs 2.4  0.7 - 4.0 K/uL   Monocytes Relative 6  3 - 12 %   Monocytes Absolute 1.0  0.1 - 1.0 K/uL   Eosinophils Relative 1  0 - 5 %   Eosinophils Absolute 0.1  0.0 - 0.7 K/uL   Basophils Relative 0  0 -  1 %   Basophils Absolute 0.0  0.0 - 0.1 K/uL  COMPREHENSIVE METABOLIC PANEL     Status: Abnormal   Collection Time    06/21/13 12:15 PM      Result Value Range   Sodium 133 (*) 135 - 145 mEq/L   Potassium 3.2 (*) 3.5 - 5.1 mEq/L   Chloride 100  96 - 112 mEq/L   CO2 18 (*) 19 - 32 mEq/L   Glucose, Bld 305 (*) 70 - 99 mg/dL   BUN 16  6 - 23 mg/dL   Creatinine, Ser 1.61  0.50 - 1.10 mg/dL   Calcium 9.7  8.4 - 09.6 mg/dL   Total Protein 7.1  6.0 - 8.3 g/dL   Albumin 4.1  3.5 - 5.2 g/dL   AST 27  0 - 37 U/L   ALT 18  0 - 35 U/L   Alkaline Phosphatase 57  39 - 117 U/L   Total Bilirubin 0.2 (*) 0.3 - 1.2 mg/dL   GFR calc non Af Amer 87 (*) >90 mL/min   GFR calc Af Amer >90  >90 mL/min   Comment: (NOTE)     The eGFR has been calculated using the CKD EPI equation.     This calculation has not been validated in all clinical situations.     eGFR's persistently <90 mL/min signify possible Chronic Kidney     Disease.  TROPONIN I     Status: None   Collection Time    06/21/13 12:15 PM      Result Value Range   Troponin I <0.30  <0.30 ng/mL   Comment:            Due to the release kinetics of cTnI,     a negative result within the first hours     of the onset of symptoms does not rule out     myocardial infarction with certainty.     If myocardial infarction is still suspected,     repeat the test at appropriate intervals.  POCT I-STAT, CHEM 8     Status: Abnormal   Collection Time    06/21/13 12:47 PM      Result Value Range   Sodium 136  135 - 145 mEq/L   Potassium 3.3 (*) 3.5 - 5.1 mEq/L   Chloride 107  96 - 112 mEq/L   BUN 15  6 - 23 mg/dL   Creatinine, Ser 0.45  0.50 - 1.10 mg/dL   Glucose, Bld 409 (*)  70 - 99 mg/dL   Calcium, Ion 8.11  9.14 - 1.30 mmol/L   TCO2 17  0 - 100 mmol/L   Hemoglobin 14.3  12.0 - 15.0 g/dL   HCT 78.2  95.6 - 21.3 %  POCT I-STAT TROPONIN I     Status: None   Collection Time    06/21/13 12:50 PM      Result Value Range   Troponin i, poc 0.01  0.00 - 0.08 ng/mL   Comment 3            Comment: Due to the release kinetics of cTnI,     a negative result within the first hours     of the onset of symptoms does not rule out     myocardial infarction with certainty.     If myocardial infarction is still suspected,     repeat the test at appropriate intervals.  CULTURE, BLOOD (ROUTINE X 2)     Status: None  Collection Time    06/21/13  1:10 PM      Result Value Range   Specimen Description BLOOD LEFT FOREARM     Special Requests BOTTLES DRAWN AEROBIC ONLY     Culture  Setup Time       Value: 06/21/2013 17:01     Performed at [facility]   Culture       Value:        BLOOD CULTURE RECEIVED NO GROWTH TO DATE CULTURE WILL BE HELD FOR 5 DAYS BEFORE ISSUING A FINAL NEGATIVE REPORT     Performed at [facility]   Report Status PENDING    CULTURE, BLOOD (ROUTINE X 2)     Status: None   Collection Time    06/21/13  1:15 PM      Result Value Range   Specimen Description BLOOD LEFT HAND     Special Requests BOTTLES DRAWN AEROBIC AND ANAEROBIC     Culture  Setup Time       Value: 06/21/2013 17:01     Performed at [facility]   Culture       Value:        BLOOD CULTURE RECEIVED NO GROWTH TO DATE CULTURE WILL BE HELD FOR 5 DAYS BEFORE ISSUING A FINAL NEGATIVE REPORT     Performed at [facility]   Report Status PENDING    BLOOD GAS, ARTERIAL     Status: Abnormal   Collection Time    06/21/13  2:57 PM      Result Value Range   FIO2 1.00     Delivery systems VENTILATOR     Mode PRESSURE REGULATED VOLUME CONTROL     VT 410     Rate 15     Peep/cpap 5.0     pH, Arterial 7.215 (*) 7.350 - 7.450   pCO2 arterial 43.3   35.0 - 45.0 mmHg   pO2, Arterial 396.0 (*) 80.0 - 100.0 mmHg   Bicarbonate 16.9 (*) 20.0 - 24.0 mEq/L   TCO2 15.9  0 - 100 mmol/L   Acid-base deficit 10.2 (*) 0.0 - 2.0 mmol/L   O2 Saturation 99.3     Patient temperature 98.6     Collection site FEMORAL ARTERY     Drawn by Delbert Harness     Comment: DR Tyson Alias   Sample type ARTERIAL DRAW     Allens test (pass/fail) PASS  PASS  MRSA PCR SCREENING     Status: None   Collection Time    06/21/13  5:44 PM      Result Value Range   MRSA by PCR NEGATIVE  NEGATIVE   Comment:            The GeneXpert MRSA Assay (FDA     approved for NASAL specimens     only), is one component of a     comprehensive MRSA colonization     surveillance program. It is not     intended to diagnose MRSA     infection nor to guide or     monitor treatment for     MRSA infections.  TYPE AND SCREEN     Status: None   Collection Time    06/21/13  6:15 PM      Result Value Range   ABO/RH(D) A POS     Antibody Screen NEG     Sample Expiration 06/24/2013    ABO/RH     Status: None  Collection Time    06/21/13  6:15 PM      Result Value Range   ABO/RH(D) A POS    CARBOXYHEMOGLOBIN     Status: None   Collection Time    06/21/13  6:30 PM      Result Value Range   Total hemoglobin 12.9  12.0 - 16.0 g/dL   O2 Saturation 40.9     Carboxyhemoglobin 0.8  0.5 - 1.5 %   Methemoglobin 0.6  0.0 - 1.5 %  URINALYSIS, ROUTINE W REFLEX MICROSCOPIC     Status: Abnormal   Collection Time    06/21/13  6:39 PM      Result Value Range   Color, Urine YELLOW  YELLOW   APPearance CLEAR  CLEAR   Specific Gravity, Urine 1.028  1.005 - 1.030   pH 6.5  5.0 - 8.0   Glucose, UA 500 (*) NEGATIVE mg/dL   Hgb urine dipstick NEGATIVE  NEGATIVE   Bilirubin Urine NEGATIVE  NEGATIVE   Ketones, ur NEGATIVE  NEGATIVE mg/dL   Protein, ur NEGATIVE  NEGATIVE mg/dL   Urobilinogen, UA 0.2  0.0 - 1.0 mg/dL   Nitrite NEGATIVE  NEGATIVE   Leukocytes, UA SMALL (*) NEGATIVE  URINE  MICROSCOPIC-ADD ON     Status: Abnormal   Collection Time    06/21/13  6:39 PM      Result Value Range   Squamous Epithelial / LPF MANY (*) RARE   WBC, UA 0-2  <3 WBC/hpf   RBC / HPF 0-2  <3 RBC/hpf   Bacteria, UA RARE  RARE   Urine-Other MUCOUS PRESENT    LACTIC ACID, PLASMA     Status: None   Collection Time    06/21/13  8:04 PM      Result Value Range   Lactic Acid, Venous 2.0  0.5 - 2.2 mmol/L  GLUCOSE, CAPILLARY     Status: Abnormal   Collection Time    06/21/13  8:48 PM      Result Value Range   Glucose-Capillary 198 (*) 70 - 99 mg/dL  BASIC METABOLIC PANEL     Status: Abnormal   Collection Time    06/21/13 10:05 PM      Result Value Range   Sodium 136  135 - 145 mEq/L   Potassium 3.6  3.5 - 5.1 mEq/L   Chloride 109  96 - 112 mEq/L   CO2 18 (*) 19 - 32 mEq/L   Glucose, Bld 155 (*) 70 - 99 mg/dL   BUN 13  6 - 23 mg/dL   Creatinine, Ser 8.11  0.50 - 1.10 mg/dL   Calcium 7.8 (*) 8.4 - 10.5 mg/dL   GFR calc non Af Amer >90  >90 mL/min   GFR calc Af Amer >90  >90 mL/min   Comment: (NOTE)     The eGFR has been calculated using the CKD EPI equation.     This calculation has not been validated in all clinical situations.     eGFR's persistently <90 mL/min signify possible Chronic Kidney     Disease.  TROPONIN I     Status: None   Collection Time    06/21/13 10:05 PM      Result Value Range   Troponin I <0.30  <0.30 ng/mL   Comment:            Due to the release kinetics of cTnI,     a negative result within the first hours  of the onset of symptoms does not rule out     myocardial infarction with certainty.     If myocardial infarction is still suspected,     repeat the test at appropriate intervals.  COMPREHENSIVE METABOLIC PANEL     Status: Abnormal   Collection Time    06/21/13 10:55 PM      Result Value Range   Sodium 136  135 - 145 mEq/L   Potassium 3.5  3.5 - 5.1 mEq/L   Chloride 110  96 - 112 mEq/L   CO2 17 (*) 19 - 32 mEq/L   Glucose, Bld 131 (*) 70  - 99 mg/dL   BUN 12  6 - 23 mg/dL   Creatinine, Ser 5.28 (*) 0.50 - 1.10 mg/dL   Calcium 7.3 (*) 8.4 - 10.5 mg/dL   Total Protein 4.8 (*) 6.0 - 8.3 g/dL   Albumin 2.7 (*) 3.5 - 5.2 g/dL   AST 40 (*) 0 - 37 U/L   ALT 34  0 - 35 U/L   Alkaline Phosphatase 34 (*) 39 - 117 U/L   Total Bilirubin 0.1 (*) 0.3 - 1.2 mg/dL   GFR calc non Af Amer >90  >90 mL/min   GFR calc Af Amer >90  >90 mL/min   Comment: (NOTE)     The eGFR has been calculated using the CKD EPI equation.     This calculation has not been validated in all clinical situations.     eGFR's persistently <90 mL/min signify possible Chronic Kidney     Disease.  PROTIME-INR     Status: None   Collection Time    06/21/13 10:55 PM      Result Value Range   Prothrombin Time 14.9  11.6 - 15.2 seconds   INR 1.20  0.00 - 1.49  APTT     Status: None   Collection Time    06/21/13 10:55 PM      Result Value Range   aPTT 26  24 - 37 seconds  PROCALCITONIN     Status: None   Collection Time    06/21/13 10:55 PM      Result Value Range   Procalcitonin 0.29     Comment:            Interpretation:     PCT (Procalcitonin) <= 0.5 ng/mL:     Systemic infection (sepsis) is not likely.     Local bacterial infection is possible.     (NOTE)             ICU PCT Algorithm               Non ICU PCT Algorithm        ----------------------------     ------------------------------             PCT < 0.25 ng/mL                 PCT < 0.1 ng/mL         Stopping of antibiotics            Stopping of antibiotics           strongly encouraged.               strongly encouraged.        ----------------------------     ------------------------------           PCT level decrease by  PCT < 0.25 ng/mL           >= 80% from peak PCT           OR PCT 0.25 - 0.5 ng/mL          Stopping of antibiotics                                                 encouraged.         Stopping of antibiotics               encouraged.         ----------------------------     ------------------------------           PCT level decrease by              PCT >= 0.25 ng/mL           < 80% from peak PCT            AND PCT >= 0.5 ng/mL            Continuing antibiotics                                                  encouraged.           Continuing antibiotics                encouraged.        ----------------------------     ------------------------------         PCT level increase compared          PCT > 0.5 ng/mL             with peak PCT AND              PCT >= 0.5 ng/mL             Escalation of antibiotics                                              strongly encouraged.          Escalation of antibiotics            strongly encouraged.  MAGNESIUM     Status: None   Collection Time    06/21/13 10:55 PM      Result Value Range   Magnesium 1.9  1.5 - 2.5 mg/dL  GLUCOSE, CAPILLARY     Status: Abnormal   Collection Time    06/22/13 12:05 AM      Result Value Range   Glucose-Capillary 114 (*) 70 - 99 mg/dL  MAGNESIUM     Status: Abnormal   Collection Time    06/22/13 12:20 AM      Result Value Range   Magnesium 1.4 (*) 1.5 - 2.5 mg/dL  CBC     Status: Abnormal   Collection Time    06/22/13 12:20 AM      Result Value Range   WBC 9.4  4.0 - 10.5 K/uL   RBC 3.44 (*) 3.87 - 5.11 MIL/uL   Hemoglobin  10.6 (*) 12.0 - 15.0 g/dL   HCT 16.1 (*) 09.6 - 04.5 %   MCV 85.5  78.0 - 100.0 fL   MCH 30.8  26.0 - 34.0 pg   MCHC 36.1 (*) 30.0 - 36.0 g/dL   RDW 40.9  81.1 - 91.4 %   Platelets 191  150 - 400 K/uL  GLUCOSE, CAPILLARY     Status: Abnormal   Collection Time    06/22/13  4:02 AM      Result Value Range   Glucose-Capillary 139 (*) 70 - 99 mg/dL  BASIC METABOLIC PANEL     Status: Abnormal   Collection Time    06/22/13  5:00 AM      Result Value Range   Sodium 137  135 - 145 mEq/L   Potassium 2.2 (*) 3.5 - 5.1 mEq/L   Comment: CRITICAL RESULT CALLED TO, READ BACK BY AND VERIFIED WITHPhill Mutter RN [phone] GREEN  R   Chloride 110  96 - 112 mEq/L   CO2 16 (*) 19 - 32 mEq/L   Glucose, Bld 136 (*) 70 - 99 mg/dL   BUN 6  6 - 23 mg/dL   Creatinine, Ser 2.13 (*) 0.50 - 1.10 mg/dL   Calcium 6.2 (*) 8.4 - 10.5 mg/dL   Comment: CRITICAL RESULT CALLED TO, READ BACK BY AND VERIFIED WITHPhill Mutter RN [phone] GREEN R   GFR calc non Af Amer >90  >90 mL/min   GFR calc Af Amer >90  >90 mL/min   Comment: (NOTE)     The eGFR has been calculated using the CKD EPI equation.     This calculation has not been validated in all clinical situations.     eGFR's persistently <90 mL/min signify possible Chronic Kidney     Disease.  CBC     Status: Abnormal   Collection Time    06/22/13  5:00 AM      Result Value Range   WBC 9.3  4.0 - 10.5 K/uL   RBC 3.50 (*) 3.87 - 5.11 MIL/uL   Hemoglobin 10.7 (*) 12.0 - 15.0 g/dL   HCT 29.5 (*) 28.4 - 13.2 %   MCV 84.3  78.0 - 100.0 fL   MCH 30.6  26.0 - 34.0 pg   MCHC 36.3 (*) 30.0 - 36.0 g/dL   RDW 44.0  10.2 - 72.5 %   Platelets 176  150 - 400 K/uL  BLOOD GAS, ARTERIAL     Status: Abnormal   Collection Time    06/22/13  5:20 AM      Result Value Range   FIO2 0.40     Delivery systems VENTILATOR     Mode PRESSURE REGULATED VOLUME CONTROL     VT 410     Rate 28     Peep/cpap 5.0     pH, Arterial 7.507 (*) 7.350 - 7.450   pCO2 arterial 21.4 (*) 35.0 - 45.0 mmHg   pO2, Arterial 177.0 (*) 80.0 - 100.0 mmHg   Bicarbonate 16.8 (*) 20.0 - 24.0 mEq/L   TCO2 17.5  0 - 100 mmol/L   Acid-base deficit 5.8 (*) 0.0 - 2.0 mmol/L   O2 Saturation 99.9     Patient temperature 98.6     Collection site A-LINE     Drawn by COLLECTED BY NURSE     Sample type ARTERIAL DRAW    POCT I-STAT 3, BLOOD GAS (G3+)  Status: Abnormal   Collection Time    06/22/13  6:36 AM      Result Value Range   pH, Arterial 7.436  7.350 - 7.450   pCO2 arterial 27.8 (*) 35.0 - 45.0 mmHg   pO2, Arterial 138.0 (*) 80.0 - 100.0 mmHg   Bicarbonate 18.6 (*) 20.0 - 24.0 mEq/L   TCO2 19  0 - 100  mmol/L   O2 Saturation 99.0     Acid-base deficit 5.0 (*) 0.0 - 2.0 mmol/L   Patient temperature 99.0 F     Sample type ARTERIAL    GLUCOSE, CAPILLARY     Status: Abnormal   Collection Time    06/22/13  7:50 AM      Result Value Range   Glucose-Capillary 138 (*) 70 - 99 mg/dL   Comment 1 Documented in Chart     Comment 2 Notify RN        Component Value Date/Time   SDES BLOOD LEFT HAND 06/21/2013 1315   SPECREQUEST BOTTLES DRAWN AEROBIC AND ANAEROBIC 06/21/2013 1315   CULT  Value:        BLOOD CULTURE RECEIVED NO GROWTH TO DATE CULTURE WILL BE HELD FOR 5 DAYS BEFORE ISSUING A FINAL NEGATIVE REPORT Performed at [facility] 06/21/2013 1315   REPTSTATUS PENDING 06/21/2013 1315   Ct Head Wo Contrast  06/21/2013   CLINICAL DATA:  Nausea, vomiting, hypotension.  EXAM: CT HEAD WITHOUT CONTRAST  TECHNIQUE: Contiguous axial images were obtained from the base of the skull through the vertex without intravenous contrast.  COMPARISON:  MRI 12/07/2012  FINDINGS: No acute intracranial abnormality. Specifically, no hemorrhage, hydrocephalus, mass lesion, acute infarction, or significant intracranial injury. No acute calvarial abnormality. Visualized paranasal sinuses and mastoids clear. Orbital soft tissues unremarkable.  IMPRESSION: No acute intracranial abnormality.   Electronically Signed   By: Charlett Nose   On: 06/21/2013 12:54   Dg Chest Port 1 View  06/22/2013   *RADIOLOGY REPORT*  Clinical Data: Evaluate lung fields, endotracheal tube position  PORTABLE CHEST - 1 VIEW  Comparison: Prior chest x-ray 06/21/2013  Findings: Although the endotracheal tube has will been withdrawn slightly, the tip remains at the level of the carina and just within the right mainstem bronchus.  Recommend withdrawing at 3 cm for more optimal positioning.  Stable right IJ vascular sheath with the tip in the mid SVC.  The nasogastric tube tip projects over the gastric fundus in the left upper quadrant.  Nonspecific  left basilar opacity with some evidence of volume loss, fever atelectasis.  No pulmonary edema.  Stable cardiac and mediastinal contours.  Atherosclerotic calcifications noted in the transverse aorta.  No acute osseous abnormality.  IMPRESSION:  1.  The tip of the endotracheal tube remains at the level of the carina and is directed just into the right mainstem bronchus. Recommend withdrawing 3 cm for more optimal positioning.  2.  Nasogastric tube has been placed.  The tip is within the gastric fundus.  3.  Left basilar atelectasis versus infiltrate, atelectasis is favored.  These results were called by telephone on 05/22/2013 at 07:55 a.m. to the patient's nurse, Raynelle Fanning, who verbally acknowledged these results.   Original Report Authenticated By: Malachy Moan, M.D.   Dg Chest Portable 1 View  06/21/2013   CLINICAL DATA:  Line placement  EXAM: PORTABLE CHEST - 1 VIEW  COMPARISON:  06/21/2013  FINDINGS: Endotracheal tube is within the right mainstem bronchus and should be retracted approximately 3-4 cm. Right  central line tip in the SVC. No pneumothorax. Slight increase left basilar opacity, likely atelectasis. Right lung is clear. No visible effusions.  IMPRESSION: Right mainstem intubation. Recommend retracting endotracheal tube 3-4 cm.  Right central line in the SVC. No pneumothorax.  Left base atelectasis.  These results will be called to the ordering clinician or representative by the Radiologist Assistant, and communication documented in the PACS Dashboard.   Electronically Signed   By: Charlett Nose   On: 06/21/2013 15:48   Dg Chest Port 1 View  06/21/2013   CLINICAL DATA:  Bradycardia, nausea.  EXAM: PORTABLE CHEST - 1 VIEW  COMPARISON:  02/25/2005.  FINDINGS: The heart size and mediastinal contours are within normal limits. Both lungs are clear. The visualized skeletal structures are unremarkable.  IMPRESSION: No active disease.   Electronically Signed   By: Charlett Nose   On: 06/21/2013 12:45   Ct  Angio Chest Aorta W/cm &/or Wo/cm  06/21/2013   *RADIOLOGY REPORT*  Clinical Data:  Hypotensive, hypothermic  CT ANGIOGRAPHY CHEST, ABDOMEN AND PELVIS  Technique:  Multidetector CT imaging through the chest, abdomen and pelvis was performed using the standard protocol during bolus administration of intravenous contrast.  Multiplanar reconstructed images including MIPs were obtained and reviewed to evaluate the vascular anatomy.  Contrast: OMNIPAQUE IOHEXOL 350 MG/ML SOLN  Comparison:  None.  CTA CHEST  Findings:  No evidence of intramural hematoma.  No evidence of thoracic aortic aneurysm or dissection.  No evidence of pulmonary embolism.  Mild centrilobular emphysematous changes.  No suspicious pulmonary nodules. No pleural effusion or pneumothorax.  Visualized thyroid is unremarkable.  The heart is top normal in size.  No pericardial effusion.  Mild atherosclerotic calcifications of the aortic arch.  No suspicious mediastinal, hilar, or axillary lymphadenopathy.  Mild degenerative changes of the thoracic spine.   Review of the MIP images confirms the above findings.  IMPRESSION: No evidence of thoracic aortic aneurysm or dissection.  No evidence of pulmonary embolism.  Mild emphysematous changes.  CTA ABDOMEN AND PELVIS  Findings:  No evidence of abdominal aortic aneurysm or dissection.  Celiac artery, SMA, and IMA are patent.  Atherosclerotic calcifications of the aorta and branch vessels.  Liver, spleen, pancreas, and adrenal glands are within normal limits.  Gallbladder is contracted.  No intrahepatic or extrahepatic ductal dilatation.  Kidneys enhance symmetrically and are within normal limits.  No hydronephrosis.  No evidence of bowel obstruction.  Appendix is not discretely visualized and may be surgically absent.  Extensive colonic diverticulosis, without evidence of diverticulitis.  Mild mucosal hyperenhancement involving the terminal ileum (series 5/image 142) and possibly the sigmoid colon (series  5/image 155), possibly reflecting infectious/inflammatory enterocolitis, although without convincing surrounding inflammatory changes.  No abdominopelvic ascites. No free air.  No suspicious abdominopelvic lymphadenopathy.  Uterus and bilateral ovaries are unremarkable.  Focal wall thickening involving the anterior bladder dome (series 5/image 170).  Degenerative changes of the lumbar spine, most prominent at L2-3. Grade 1 anterolisthesis of L5 on S1.   Review of the MIP images confirms the above findings.  IMPRESSION: No evidence of abdominal aortic aneurysm or dissection.  Colonic diverticulosis, without evidence of diverticulitis.  Possible mucosal hyperenhancement involving the terminal ileum and sigmoid colon, equivocal.  Correlate for infectious/inflammatory enterocolitis.  No free air.  Focal wall thickening involving the anterior bladder dome. Cystoscopic correlation is suggested.   Original Report Authenticated By: Charline Bills, M.D.   Ct Angio Abd/pel W/ And/or W/o  06/21/2013   *  RADIOLOGY REPORT*  Clinical Data:  Hypotensive, hypothermic  CT ANGIOGRAPHY CHEST, ABDOMEN AND PELVIS  Technique:  Multidetector CT imaging through the chest, abdomen and pelvis was performed using the standard protocol during bolus administration of intravenous contrast.  Multiplanar reconstructed images including MIPs were obtained and reviewed to evaluate the vascular anatomy.  Contrast: OMNIPAQUE IOHEXOL 350 MG/ML SOLN  Comparison:  None.  CTA CHEST  Findings:  No evidence of intramural hematoma.  No evidence of thoracic aortic aneurysm or dissection.  No evidence of pulmonary embolism.  Mild centrilobular emphysematous changes.  No suspicious pulmonary nodules. No pleural effusion or pneumothorax.  Visualized thyroid is unremarkable.  The heart is top normal in size.  No pericardial effusion.  Mild atherosclerotic calcifications of the aortic arch.  No suspicious mediastinal, hilar, or axillary lymphadenopathy.   Mild degenerative changes of the thoracic spine.   Review of the MIP images confirms the above findings.  IMPRESSION: No evidence of thoracic aortic aneurysm or dissection.  No evidence of pulmonary embolism.  Mild emphysematous changes.  CTA ABDOMEN AND PELVIS  Findings:  No evidence of abdominal aortic aneurysm or dissection.  Celiac artery, SMA, and IMA are patent.  Atherosclerotic calcifications of the aorta and branch vessels.  Liver, spleen, pancreas, and adrenal glands are within normal limits.  Gallbladder is contracted.  No intrahepatic or extrahepatic ductal dilatation.  Kidneys enhance symmetrically and are within normal limits.  No hydronephrosis.  No evidence of bowel obstruction.  Appendix is not discretely visualized and may be surgically absent.  Extensive colonic diverticulosis, without evidence of diverticulitis.  Mild mucosal hyperenhancement involving the terminal ileum (series 5/image 142) and possibly the sigmoid colon (series 5/image 155), possibly reflecting infectious/inflammatory enterocolitis, although without convincing surrounding inflammatory changes.  No abdominopelvic ascites. No free air.  No suspicious abdominopelvic lymphadenopathy.  Uterus and bilateral ovaries are unremarkable.  Focal wall thickening involving the anterior bladder dome (series 5/image 170).  Degenerative changes of the lumbar spine, most prominent at L2-3. Grade 1 anterolisthesis of L5 on S1.   Review of the MIP images confirms the above findings.  IMPRESSION: No evidence of abdominal aortic aneurysm or dissection.  Colonic diverticulosis, without evidence of diverticulitis.  Possible mucosal hyperenhancement involving the terminal ileum and sigmoid colon, equivocal.  Correlate for infectious/inflammatory enterocolitis.  No free air.  Focal wall thickening involving the anterior bladder dome. Cystoscopic correlation is suggested.   Original Report Authenticated By: Charline Bills, M.D.     Recent Results  (from the past 720 hour(s))  CULTURE, BLOOD (ROUTINE X 2)     Status: None   Collection Time    06/21/13  1:10 PM      Result Value Range Status   Specimen Description BLOOD LEFT FOREARM   Final   Special Requests BOTTLES DRAWN AEROBIC ONLY   Final   Culture  Setup Time     Final   Value: 06/21/2013 17:01     Performed at [facility]   Culture     Final   Value:        BLOOD CULTURE RECEIVED NO GROWTH TO DATE CULTURE WILL BE HELD FOR 5 DAYS BEFORE ISSUING A FINAL NEGATIVE REPORT     Performed at [facility]   Report Status PENDING   Incomplete  CULTURE, BLOOD (ROUTINE X 2)     Status: None   Collection Time    06/21/13  1:15 PM      Result Value Range Status  Specimen Description BLOOD LEFT HAND   Final   Special Requests BOTTLES DRAWN AEROBIC AND ANAEROBIC   Final   Culture  Setup Time     Final   Value: 06/21/2013 17:01     Performed at [facility]   Culture     Final   Value:        BLOOD CULTURE RECEIVED NO GROWTH TO DATE CULTURE WILL BE HELD FOR 5 DAYS BEFORE ISSUING A FINAL NEGATIVE REPORT     Performed at [facility]   Report Status PENDING   Incomplete  MRSA PCR SCREENING     Status: None   Collection Time    06/21/13  5:44 PM      Result Value Range Status   MRSA by PCR NEGATIVE  NEGATIVE Final   Comment:            The GeneXpert MRSA Assay (FDA     approved for NASAL specimens     only), is one component of a     comprehensive MRSA colonization     surveillance program. It is not     intended to diagnose MRSA     infection nor to guide or     monitor treatment for     MRSA infections.     Impression/Recommendation [name] is an 67 y.o. female. with PMH arthritis, chronic lumbar/hip pain, hyperthyroid, RLS, and former tobacco user who presented to [facility] with AMS and acute respiratory failure requiring ETT, IVF and pressors. She is extubated in bed this am with no immediate complications.  CT abd/pelvis reveals  possible mucosal hyperenhancement involving the terminal ileum and sigmoid colon, equivocal. Correlate for infectious/inflammatory enterocolitis. No free air.  Given her c/o bloody diarrhea, abd pain, drop in Hgb (some attributed to IVF resuscitation), and leukocytosis on admission suspect GI pathology could be at play here (with possibility of infectious/inflammatory entercolitis seen on CT abd/pelvis). Other differential diagnosis besides bacterial etiology (e.g. EHEC, Shigella, Salmonella, Yersinia Campy) includes viral or autoimmune. Crohn's colitis can affect the terminal ileum and is assoc w/ abd pain and bloody diarrhea as well; UC would affect the large intestine only and is less likely in this patient.   Bloody diarrhea: --d/c vancomycin as do not suspect Staph --continue rocephin and flagyl for now pending further results --f/u stool cx and GI pathogen panel --f/u autoimmune serologies (ANA, C3, C4, etc...) --f/u HIV rna --f/u blood cx   Thank you so much for this interesting consult  [facility] [phone] (pager) [phone] (office) 06/22/2013, 9:28 AM  Lewie Chamber 06/22/2013, 9:28 AM

## 2013-06-22 NOTE — Progress Notes (Signed)
eLink Physician-Brief Progress Note Patient Name: [name] DOB: [birth year] MRN: [id number]  Date of Service  06/22/2013   HPI/Events of Note     eICU Interventions  Renewed home meds - for pain & restless legs   Intervention Category Intermediate Interventions: Medication change / dose adjustment  [name] V. 06/22/2013, 5:46 PM

## 2013-06-22 NOTE — Progress Notes (Signed)
eLink Physician-Brief Progress Note Patient Name: [name] DOB: [birth year] MRN: [id number]  Date of Service  06/22/2013   HPI/Events of Note  Hypomag  eICU Interventions  Magnesium replaced   Intervention Category Intermediate Interventions: Electrolyte abnormality - evaluation and management  [name] 06/22/2013, 12:57 AM

## 2013-06-22 NOTE — Progress Notes (Signed)
[facility] ADULT ICU REPLACEMENT PROTOCOL FOR AM LAB REPLACEMENT ONLY  The patient does apply for the [facility] Adult ICU Electrolyte Replacment Protocol based on the criteria listed below:   1. Is GFR >/= 40 ml/min? yes  Patient's GFR today is >90 2. Is urine output >/= 0.5 ml/kg/hr for the last 6 hours? yes Patient's UOP is 5.3 ml/kg/hr 3. Is BUN < 60 mg/dL? yes  Patient's BUN today is 6 4. Abnormal electrolyte(s): K 2.2 5. Ordered repletion with: per protocol 6. If a panic level lab has been reported, has the CCM MD in charge been notified? yes.   Physician:  Dr Darrick Penna  Barnetta Chapel, Stevan Eberwein A 06/22/2013 6:07 AM

## 2013-06-23 ENCOUNTER — Inpatient Hospital Stay (HOSPITAL_COMMUNITY): Payer: Medicare Other

## 2013-06-23 DIAGNOSIS — E039 Hypothyroidism, unspecified: Secondary | ICD-10-CM

## 2013-06-23 DIAGNOSIS — T68XXXA Hypothermia, initial encounter: Secondary | ICD-10-CM

## 2013-06-23 LAB — CBC WITH DIFFERENTIAL/PLATELET
Basophils Absolute: 0 10*3/uL (ref 0.0–0.1)
Basophils Relative: 0 % (ref 0–1)
Eosinophils Relative: 0 % (ref 0–5)
Hemoglobin: 11 g/dL — ABNORMAL LOW (ref 12.0–15.0)
Lymphocytes Relative: 5 % — ABNORMAL LOW (ref 12–46)
Lymphs Abs: 0.8 10*3/uL (ref 0.7–4.0)
MCH: 30.6 pg (ref 26.0–34.0)
Monocytes Absolute: 1 10*3/uL (ref 0.1–1.0)
Monocytes Relative: 7 % (ref 3–12)
Neutrophils Relative %: 88 % — ABNORMAL HIGH (ref 43–77)
RBC: 3.6 MIL/uL — ABNORMAL LOW (ref 3.87–5.11)
WBC: 15 10*3/uL — ABNORMAL HIGH (ref 4.0–10.5)

## 2013-06-23 LAB — COMPREHENSIVE METABOLIC PANEL
AST: 39 U/L — ABNORMAL HIGH (ref 0–37)
Albumin: 2.9 g/dL — ABNORMAL LOW (ref 3.5–5.2)
CO2: 22 mEq/L (ref 19–32)
Calcium: 7.9 mg/dL — ABNORMAL LOW (ref 8.4–10.5)
Creatinine, Ser: 0.41 mg/dL — ABNORMAL LOW (ref 0.50–1.10)
GFR calc non Af Amer: >90 mL/min (ref >90)

## 2013-06-23 LAB — ANTI-DNA ANTIBODY, DOUBLE-STRANDED: ds DNA Ab: <1 IU/mL (ref <30)

## 2013-06-23 LAB — HIV-1 RNA QUANT-NO REFLEX-BLD
HIV 1 RNA Quant: <20 copies/mL (ref <20)
HIV-1 RNA Quant, Log: <1.3 {Log} (ref <1.30)

## 2013-06-23 LAB — ANA: Anti Nuclear Antibody(ANA): NEGATIVE

## 2013-06-23 MED ORDER — POTASSIUM CHLORIDE CRYS ER 20 MEQ PO TBCR
30.0000 meq | EXTENDED_RELEASE_TABLET | ORAL | Status: AC
  Administered 2013-06-23 (×2): 30 meq via ORAL
  Filled 2013-06-23 (×2): qty 1

## 2013-06-23 MED ORDER — TEMAZEPAM 15 MG PO CAPS
15.0000 mg | ORAL_CAPSULE | Freq: Every day | ORAL | Status: DC
  Administered 2013-06-23 – 2013-06-25 (×3): 15 mg via ORAL
  Filled 2013-06-23 (×3): qty 1

## 2013-06-23 MED ORDER — HYDROCORTISONE SOD SUCCINATE 100 MG IJ SOLR
25.0000 mg | Freq: Four times a day (QID) | INTRAMUSCULAR | Status: DC
  Administered 2013-06-23 – 2013-06-24 (×4): 25 mg via INTRAVENOUS
  Filled 2013-06-23 (×9): qty 0.5

## 2013-06-23 MED ORDER — POTASSIUM CHLORIDE CRYS ER 20 MEQ PO TBCR
EXTENDED_RELEASE_TABLET | ORAL | Status: AC
  Filled 2013-06-23: qty 2

## 2013-06-23 NOTE — Progress Notes (Signed)
[facility]    Subjective: Patient states she is doing well this morning and is sitting up in chair comfortable. She denies any further bloody diarrhea. She also denies any further confusion, weakness, or N/V. Her right sided abdominal pain has resolved but she now c/o left sided abdominal pain.    Antibiotics:  Anti-infectives   Start     Dose/Rate Route Frequency Ordered Stop   06/22/13 1400  vancomycin (VANCOCIN) IVPB 750 mg/150 ml premix  Status:  Discontinued     750 mg 150 mL/hr over 60 Minutes Intravenous Every 24 hours 06/21/13 1422 06/22/13 0839   06/22/13 1400  cefTRIAXone (ROCEPHIN) 1 g in dextrose 5 % 50 mL IVPB     1 g 100 mL/hr over 30 Minutes Intravenous Every 24 hours 06/22/13 0839     06/22/13 1000  vancomycin (VANCOCIN) 500 mg in sodium chloride 0.9 % 100 mL IVPB  Status:  Discontinued     500 mg 100 mL/hr over 60 Minutes Intravenous Every 12 hours 06/22/13 0839 06/22/13 1313   06/21/13 2000  metroNIDAZOLE (FLAGYL) IVPB 500 mg     500 mg 100 mL/hr over 60 Minutes Intravenous Every 6 hours 06/21/13 1843     06/21/13 1400  doxycycline (VIBRAMYCIN) 100 mg in dextrose 5 % 250 mL IVPB     100 mg 125 mL/hr over 120 Minutes Intravenous  Once 06/21/13 1332 06/21/13 1815   06/21/13 1400  vancomycin (VANCOCIN) IVPB 1000 mg/200 mL premix     1,000 mg 200 mL/hr over 60 Minutes Intravenous STAT 06/21/13 1357 06/21/13 1918   06/21/13 1400  metroNIDAZOLE (FLAGYL) IVPB 500 mg  Status:  Discontinued     500 mg 100 mL/hr over 60 Minutes Intravenous 4 times per day 06/21/13 1358 06/21/13 1843   06/21/13 1345  cefTRIAXone (ROCEPHIN) 2 g in dextrose 5 % 50 mL IVPB     2 g 100 mL/hr over 30 Minutes Intravenous  Once 06/21/13 1331 06/21/13 1425      Medications: Scheduled Meds: . antiseptic oral rinse  15 mL Mouth Rinse BID  . cefTRIAXone (ROCEPHIN)  IV  1 g Intravenous Q24H  . famotidine (PEPCID) IV  20 mg Intravenous Q12H  . hydrocortisone sodium  succinate  25 mg Intravenous Q6H  . metronidazole  500 mg Intravenous Q6H  . temazepam  15 mg Oral QHS   Continuous Infusions:  PRN Meds:.sodium chloride, HYDROcodone-acetaminophen, rOPINIRole, sodium chloride, sodium chloride, traMADol   Objective: Weight change: 5.511 kg (12 lb 2.4 oz)  Intake/Output Summary (Last 24 hours) at 06/23/13 1249 Last data filed at 06/23/13 0951  Gross per 24 hour  Intake   1790 ml  Output   3150 ml  Net  -1360 ml   Blood pressure 127/57, pulse 92, temperature 98.4 F (36.9 C), temperature source Oral, resp. rate 17, height 5' (1.524 m), weight 54.5 kg (120 lb 2.4 oz), SpO2 97.00%. Temp:  [98 F (36.7 C)-98.4 F (36.9 C)] 98.4 F (36.9 C) (08/28 0400) Pulse Rate:  [85-109] 92 (08/28 1200) Resp:  [10-22] 17 (08/28 1200) BP: (109-138)/(41-87) 127/57 mmHg (08/28 1200) SpO2:  [92 %-100 %] 97 % (08/28 1200) Weight:  [54.5 kg (120 lb 2.4 oz)] 54.5 kg (120 lb 2.4 oz) (08/28 0416)  Physical Exam: General: Alert and awake, not in any acute distress. HEENT: anicteric sclera, pupils reactive to light and accommodation, EOMI CVS regular rate, normal r,  no murmur rubs or gallops Chest: clear to auscultation  bilaterally, no wheezing, rales or rhonchi Abdomen: soft, TTP LLQ, nondistended, normal bowel sounds, Extremities: no  clubbing or edema noted bilaterally Skin: no rashes Lymph: no new lymphadenopathy Neuro: nonfocal  Lab Results:  Recent Labs  06/22/13 0500 06/23/13 0450  WBC 9.3 15.0*  HGB 10.7* 11.0*  HCT 29.5* 30.8*  PLT 176 196    BMET  Recent Labs  06/22/13 1900 06/23/13 0450  NA 135 137  K 3.6 3.1*  CL 104 103  CO2 21 22  GLUCOSE 125* 134*  BUN 3* 4*  CREATININE 0.41* 0.41*  CALCIUM 7.6* 7.9*    Micro Results: Recent Results (from the past 240 hour(s))  CULTURE, BLOOD (ROUTINE X 2)     Status: None   Collection Time    06/21/13  1:10 PM      Result Value Range Status   Specimen Description BLOOD LEFT FOREARM    Final   Special Requests BOTTLES DRAWN AEROBIC ONLY   Final   Culture  Setup Time     Final   Value: 06/21/2013 17:01     Performed at [facility]   Culture     Final   Value:        BLOOD CULTURE RECEIVED NO GROWTH TO DATE CULTURE WILL BE HELD FOR 5 DAYS BEFORE ISSUING A FINAL NEGATIVE REPORT     Performed at [facility]   Report Status PENDING   Incomplete  CULTURE, BLOOD (ROUTINE X 2)     Status: None   Collection Time    06/21/13  1:15 PM      Result Value Range Status   Specimen Description BLOOD LEFT HAND   Final   Special Requests BOTTLES DRAWN AEROBIC AND ANAEROBIC   Final   Culture  Setup Time     Final   Value: 06/21/2013 17:01     Performed at [facility]   Culture     Final   Value:        BLOOD CULTURE RECEIVED NO GROWTH TO DATE CULTURE WILL BE HELD FOR 5 DAYS BEFORE ISSUING A FINAL NEGATIVE REPORT     Performed at [facility]   Report Status PENDING   Incomplete  MRSA PCR SCREENING     Status: None   Collection Time    06/21/13  5:44 PM      Result Value Range Status   MRSA by PCR NEGATIVE  NEGATIVE Final   Comment:            The GeneXpert MRSA Assay (FDA     approved for NASAL specimens     only), is one component of a     comprehensive MRSA colonization     surveillance program. It is not     intended to diagnose MRSA     infection nor to guide or     monitor treatment for     MRSA infections.  URINE CULTURE     Status: None   Collection Time    06/21/13  6:39 PM      Result Value Range Status   Specimen Description URINE, CATHETERIZED   Final   Special Requests NONE   Final   Culture  Setup Time     Final   Value: 06/21/2013 19:05     Performed at [facility] Count     Final   Value: NO GROWTH     Performed at [facility]  Culture     Final   Value: NO GROWTH     Performed at [facility]   Report Status 06/22/2013 FINAL   Final  CULTURE, RESPIRATORY  (NON-EXPECTORATED)     Status: None   Collection Time    06/22/13  7:49 AM      Result Value Range Status   Specimen Description TRACHEAL ASPIRATE   Final   Special Requests Normal   Final   Gram Stain     Final   Value: RARE WBC PRESENT, PREDOMINANTLY PMN     NO ORGANISMS SEEN     Performed at [facility]   Culture     Final   Value: NO GROWTH 1 DAY     Performed at [facility]   Report Status PENDING   Incomplete    Studies/Results: Dg Chest Port 1 View  06/23/2013   *RADIOLOGY REPORT*  Clinical Data: Shortness of breath.  PORTABLE CHEST - 1 VIEW  Comparison: 06/22/2013.  Findings: The endotracheal tube and NG tubes have been removed. The right IJ catheter is stable.  The cardiac silhouette, mediastinal and hilar contours are normal and the lungs are clear except for minimal bibasilar atelectasis.  No pulmonary edema.  IMPRESSION:  1.  Removal of ET and NG tubes. 2.  Minimal streaky bibasilar atelectasis.   Original Report Authenticated By: Rudie Meyer, M.D.   Dg Chest Port 1 View  06/22/2013   *RADIOLOGY REPORT*  Clinical Data: Evaluate lung fields, endotracheal tube position  PORTABLE CHEST - 1 VIEW  Comparison: Prior chest x-ray 06/21/2013  Findings: Although the endotracheal tube has will been withdrawn slightly, the tip remains at the level of the carina and just within the right mainstem bronchus.  Recommend withdrawing at 3 cm for more optimal positioning.  Stable right IJ vascular sheath with the tip in the mid SVC.  The nasogastric tube tip projects over the gastric fundus in the left upper quadrant.  Nonspecific left basilar opacity with some evidence of volume loss, fever atelectasis.  No pulmonary edema.  Stable cardiac and mediastinal contours.  Atherosclerotic calcifications noted in the transverse aorta.  No acute osseous abnormality.  IMPRESSION:  1.  The tip of the endotracheal tube remains at the level of the carina and is directed just into the right  mainstem bronchus. Recommend withdrawing 3 cm for more optimal positioning.  2.  Nasogastric tube has been placed.  The tip is within the gastric fundus.  3.  Left basilar atelectasis versus infiltrate, atelectasis is favored.  These results were called by telephone on 05/22/2013 at 07:55 a.m. to the patient's nurse, Raynelle Fanning, who verbally acknowledged these results.   Original Report Authenticated By: Malachy Moan, M.D.   Dg Chest Portable 1 View  06/21/2013   CLINICAL DATA:  Line placement  EXAM: PORTABLE CHEST - 1 VIEW  COMPARISON:  06/21/2013  FINDINGS: Endotracheal tube is within the right mainstem bronchus and should be retracted approximately 3-4 cm. Right central line tip in the SVC. No pneumothorax. Slight increase left basilar opacity, likely atelectasis. Right lung is clear. No visible effusions.  IMPRESSION: Right mainstem intubation. Recommend retracting endotracheal tube 3-4 cm.  Right central line in the SVC. No pneumothorax.  Left base atelectasis.  These results will be called to the ordering clinician or representative by the Radiologist Assistant, and communication documented in the PACS Dashboard.   Electronically Signed   By: Charlett Nose   On: 06/21/2013 15:48   Ct  Angio Chest Aorta W/cm &/or Wo/cm  06/21/2013   *RADIOLOGY REPORT*  Clinical Data:  Hypotensive, hypothermic  CT ANGIOGRAPHY CHEST, ABDOMEN AND PELVIS  Technique:  Multidetector CT imaging through the chest, abdomen and pelvis was performed using the standard protocol during bolus administration of intravenous contrast.  Multiplanar reconstructed images including MIPs were obtained and reviewed to evaluate the vascular anatomy.  Contrast: OMNIPAQUE IOHEXOL 350 MG/ML SOLN  Comparison:  None.  CTA CHEST  Findings:  No evidence of intramural hematoma.  No evidence of thoracic aortic aneurysm or dissection.  No evidence of pulmonary embolism.  Mild centrilobular emphysematous changes.  No suspicious pulmonary nodules. No  pleural effusion or pneumothorax.  Visualized thyroid is unremarkable.  The heart is top normal in size.  No pericardial effusion.  Mild atherosclerotic calcifications of the aortic arch.  No suspicious mediastinal, hilar, or axillary lymphadenopathy.  Mild degenerative changes of the thoracic spine.   Review of the MIP images confirms the above findings.  IMPRESSION: No evidence of thoracic aortic aneurysm or dissection.  No evidence of pulmonary embolism.  Mild emphysematous changes.  CTA ABDOMEN AND PELVIS  Findings:  No evidence of abdominal aortic aneurysm or dissection.  Celiac artery, SMA, and IMA are patent.  Atherosclerotic calcifications of the aorta and branch vessels.  Liver, spleen, pancreas, and adrenal glands are within normal limits.  Gallbladder is contracted.  No intrahepatic or extrahepatic ductal dilatation.  Kidneys enhance symmetrically and are within normal limits.  No hydronephrosis.  No evidence of bowel obstruction.  Appendix is not discretely visualized and may be surgically absent.  Extensive colonic diverticulosis, without evidence of diverticulitis.  Mild mucosal hyperenhancement involving the terminal ileum (series 5/image 142) and possibly the sigmoid colon (series 5/image 155), possibly reflecting infectious/inflammatory enterocolitis, although without convincing surrounding inflammatory changes.  No abdominopelvic ascites. No free air.  No suspicious abdominopelvic lymphadenopathy.  Uterus and bilateral ovaries are unremarkable.  Focal wall thickening involving the anterior bladder dome (series 5/image 170).  Degenerative changes of the lumbar spine, most prominent at L2-3. Grade 1 anterolisthesis of L5 on S1.   Review of the MIP images confirms the above findings.  IMPRESSION: No evidence of abdominal aortic aneurysm or dissection.  Colonic diverticulosis, without evidence of diverticulitis.  Possible mucosal hyperenhancement involving the terminal ileum and sigmoid colon,  equivocal.  Correlate for infectious/inflammatory enterocolitis.  No free air.  Focal wall thickening involving the anterior bladder dome. Cystoscopic correlation is suggested.   Original Report Authenticated By: Charline Bills, M.D.   Ct Angio Abd/pel W/ And/or W/o  06/21/2013   *RADIOLOGY REPORT*  Clinical Data:  Hypotensive, hypothermic  CT ANGIOGRAPHY CHEST, ABDOMEN AND PELVIS  Technique:  Multidetector CT imaging through the chest, abdomen and pelvis was performed using the standard protocol during bolus administration of intravenous contrast.  Multiplanar reconstructed images including MIPs were obtained and reviewed to evaluate the vascular anatomy.  Contrast: OMNIPAQUE IOHEXOL 350 MG/ML SOLN  Comparison:  None.  CTA CHEST  Findings:  No evidence of intramural hematoma.  No evidence of thoracic aortic aneurysm or dissection.  No evidence of pulmonary embolism.  Mild centrilobular emphysematous changes.  No suspicious pulmonary nodules. No pleural effusion or pneumothorax.  Visualized thyroid is unremarkable.  The heart is top normal in size.  No pericardial effusion.  Mild atherosclerotic calcifications of the aortic arch.  No suspicious mediastinal, hilar, or axillary lymphadenopathy.  Mild degenerative changes of the thoracic spine.   Review of the MIP images confirms  the above findings.  IMPRESSION: No evidence of thoracic aortic aneurysm or dissection.  No evidence of pulmonary embolism.  Mild emphysematous changes.  CTA ABDOMEN AND PELVIS  Findings:  No evidence of abdominal aortic aneurysm or dissection.  Celiac artery, SMA, and IMA are patent.  Atherosclerotic calcifications of the aorta and branch vessels.  Liver, spleen, pancreas, and adrenal glands are within normal limits.  Gallbladder is contracted.  No intrahepatic or extrahepatic ductal dilatation.  Kidneys enhance symmetrically and are within normal limits.  No hydronephrosis.  No evidence of bowel obstruction.  Appendix is not  discretely visualized and may be surgically absent.  Extensive colonic diverticulosis, without evidence of diverticulitis.  Mild mucosal hyperenhancement involving the terminal ileum (series 5/image 142) and possibly the sigmoid colon (series 5/image 155), possibly reflecting infectious/inflammatory enterocolitis, although without convincing surrounding inflammatory changes.  No abdominopelvic ascites. No free air.  No suspicious abdominopelvic lymphadenopathy.  Uterus and bilateral ovaries are unremarkable.  Focal wall thickening involving the anterior bladder dome (series 5/image 170).  Degenerative changes of the lumbar spine, most prominent at L2-3. Grade 1 anterolisthesis of L5 on S1.   Review of the MIP images confirms the above findings.  IMPRESSION: No evidence of abdominal aortic aneurysm or dissection.  Colonic diverticulosis, without evidence of diverticulitis.  Possible mucosal hyperenhancement involving the terminal ileum and sigmoid colon, equivocal.  Correlate for infectious/inflammatory enterocolitis.  No free air.  Focal wall thickening involving the anterior bladder dome. Cystoscopic correlation is suggested.   Original Report Authenticated By: Charline Bills, M.D.      Assessment/Plan: KRISTIANNA SAPERSTEIN is a 67 y.o. female with PMH arthritis, chronic lumbar/hip pain, hyperthyroid, RLS, and former tobacco user who presented to [facility] with AMS and acute respiratory failure requiring ETT, IVF and pressors. She was extubated yesterday with no immediate complications.  CT abd/pelvis reveals possible mucosal hyperenhancement involving the terminal ileum and sigmoid colon, equivocal. Correlate for infectious/inflammatory enterocolitis. No free air.   With her CT scan findings, she may have enterocolitis and should still be continued on rocephin and flagyl. Differential remains bacterial vs viral vs IBD.   Will continue to follow her and follow-up on pending lab results.   Bloody diarrhea:    --continue rocephin and flagyl for now in setting of possible enterocolitis  --f/u stool cx and GI pathogen panel  --C3 and C4 levels decreased, Anti-dsDNA negative  --f/u HIV rna  --f/u blood cx    LOS: 2 days   Lewie Chamber 06/23/2013, 12:49 PM

## 2013-06-23 NOTE — Consult Note (Signed)
Referring Provider: Dr. Micah Flesher Primary Care Physician:  Dina Rich, MD Primary Gastroenterologist:  [facility]  Reason for Consultation:  Bloody diarrhea, questionable colitis  HPI: [name] is a 67 y.o. female who was admitted to the hospital 2 days ago in extremely unstable condition, heart rate in the 30s, hypothermic with a temp of 93, hypotensive. The reason for this abrupt, severe clinical illness has not been ascertained. She had severe lactic acidosis, and even required transient intubation. However, she turned around very quickly, was extubated the next day, and moved out to the floor the day after that.  She apparently had been having some bloody diarrhea, and her CT scan, done without oral contrast, raised the question of some hyperenhancing and in the terminal ileum and sigmoid colon, raising the question of inflammation, although there was no peri-intestinal inflammatory change to support that being a true finding.   At this time, she does report some mild left lower quadrant discomfort, and she is having still some loose stools, perhaps 5 bowel movements today, without visible blood that she has noticed.  She indicates that she had a screening colonoscopy about 5 years ago. It sounds as though that was done at the [facility] endoscopy unit, although she does not remember the physician's name. Apparently there were some polyps present.  She does not have any chronic prodromal GI tract symptomatology, such as reflux, anorexia, involuntary weight loss, nausea, abdominal pain, constipation, diarrhea, or rectal bleeding.  Dr. Tyson Alias asked me to see the patient, with the intent of arranging elective outpatient colonoscopic evaluation because of her history of diarrhea and transient bleeding, the CT findings, and a history of some arthralgias raising the question of possible underlying inflammatory bowel disease.   Past Medical History  Diagnosis Date  . Hip pain    . Back pain   . GERD (gastroesophageal reflux disease)   . Hyperthyroidism   . Arthritis   . Restless leg syndrome   . Ex-smoker Quit 15 years ago    Smoked 1 PPD x 20 years    Past Surgical History  Procedure Laterality Date  . Right rotator cuff repair    . Esophageal dilation      X 2  . Bladder suspension      Prior to Admission medications   Medication Sig Start Date End Date Taking? Authorizing Provider  atorvastatin (LIPITOR) 40 MG tablet Take 40 mg by mouth daily.   Yes Historical Provider, MD  bethanechol (URECHOLINE) 50 MG tablet Take by mouth 2 (two) times daily.   Yes Historical Provider, MD  simvastatin (ZOCOR) 80 MG tablet Take 80 mg by mouth at bedtime.   Yes Historical Provider, MD  Alpha-Lipoic Acid 100 MG CAPS Take 1 capsule by mouth daily.    Historical Provider, MD  aspirin 325 MG tablet Take 1 tablet (325 mg total) by mouth daily. 12/08/12   Larey Seat, MD  b complex vitamins tablet Take 2 tablets by mouth daily.    Historical Provider, MD  bethanechol (URECHOLINE) 25 MG tablet Take 25 mg by mouth 3 (three) times daily.    Historical Provider, MD  cholecalciferol (VITAMIN D) 1000 UNITS tablet Take 1,000 Units by mouth daily.    Historical Provider, MD  Coenzyme Q10 (COQ-10) 100 MG CAPS Take 100 mg by mouth every evening.    Historical Provider, MD  cyclobenzaprine (FLEXERIL) 10 MG tablet Take 10 mg by mouth 2 (two) times daily as needed for muscle spasms (for muscle  spasams).    Historical Provider, MD  fluticasone (FLONASE) 50 MCG/ACT nasal spray Place 2 sprays into the nose daily.    Historical Provider, MD  gabapentin (NEURONTIN) 100 MG capsule Take 100-300 mg by mouth 3 (three) times daily. 200 mg every morning and at noon, and 300 mg every evening 12/08/12   Larey Seat, MD  HYDROcodone-acetaminophen (NORCO/VICODIN) 5-325 MG per tablet Take 1 tablet by mouth every 6 (six) hours as needed for pain (for pain).    Historical Provider, MD  levothyroxine  (SYNTHROID, LEVOTHROID) 50 MCG tablet Take 50 mcg by mouth daily.    Historical Provider, MD  Melatonin 3 MG CAPS Take 3 mg by mouth at bedtime.    Historical Provider, MD  Multiple Vitamin (MULTIVITAMIN WITH MINERALS) TABS Take 1 tablet by mouth daily.    Historical Provider, MD  nabumetone (RELAFEN) 500 MG tablet Take 500 mg by mouth 2 (two) times daily.    Historical Provider, MD  omeprazole (PRILOSEC) 40 MG capsule Take 40 mg by mouth every evening.    Historical Provider, MD  rOPINIRole (REQUIP) 1 MG tablet Take 1 mg by mouth 3 (three) times daily as needed.     Historical Provider, MD  temazepam (RESTORIL) 30 MG capsule Take 30 mg by mouth at bedtime.    Historical Provider, MD  traMADol (ULTRAM) 50 MG tablet Take 50 mg by mouth 4 (four) times daily as needed.     Historical Provider, MD    Current Facility-Administered Medications  Medication Dose Route Frequency Provider Last Rate Last Dose  . 0.9 %  sodium chloride infusion  250 mL Intravenous PRN Jeanella Craze, NP      . antiseptic oral rinse (BIOTENE) solution 15 mL  15 mL Mouth Rinse BID Nelda Bucks, MD   15 mL at 06/23/13 0800  . cefTRIAXone (ROCEPHIN) 1 g in dextrose 5 % 50 mL IVPB  1 g Intravenous Q24H Benny Lennert, RPH   1 g at 06/23/13 1256  . famotidine (PEPCID) IVPB 20 mg  20 mg Intravenous Q12H Jeanella Craze, NP   20 mg at 06/23/13 0951  . HYDROcodone-acetaminophen (NORCO/VICODIN) 5-325 MG per tablet 1 tablet  1 tablet Oral Q6H PRN Oretha Milch, MD   1 tablet at 06/23/13 0145  . hydrocortisone sodium succinate (SOLU-CORTEF) 100 mg/2 mL injection 25 mg  25 mg Intravenous Q6H Nelda Bucks, MD   25 mg at 06/23/13 1833  . metroNIDAZOLE (FLAGYL) IVPB 500 mg  500 mg Intravenous Q6H Nelda Bucks, MD   500 mg at 06/23/13 1258  . rOPINIRole (REQUIP) tablet 1 mg  1 mg Oral QHS PRN Oretha Milch, MD   1 mg at 06/22/13 2109  . sodium chloride 0.9 % bolus 1,000 mL  1,000 mL Intravenous PRN Jeanella Craze, NP       . sodium chloride 0.9 % bolus 1,000 mL  1,000 mL Intravenous PRN Jeanella Craze, NP      . temazepam (RESTORIL) capsule 15 mg  15 mg Oral QHS Nelda Bucks, MD      . traMADol Janean Sark) tablet 50 mg  50 mg Oral QID PRN Oretha Milch, MD   50 mg at 06/23/13 0145    Allergies as of 06/21/2013  . (No Known Allergies)    History reviewed. No pertinent family history.  History   Social History  . Marital Status: Married    Spouse Name: N/A  Number of Children: N/A  . Years of Education: N/A   Occupational History  . Not on file.   Social History Main Topics  . Smoking status: Former Smoker    Types: Cigarettes  . Smokeless tobacco: Never Used     Comment: Quit about 20 years before.  . Alcohol Use: No  . Drug Use: No  . Sexual Activity: No   Other Topics Concern  . Not on file   Social History Narrative   Patient lives with her husband.    She is an adopted kid and so is unaware of her family history.   She has 2 children- one has asthma and other has arthritis.          Review of Systems: See history of present illness  Physical Exam: Vital signs in last 24 hours: Temp:  [98.2 F (36.8 C)-99 F (37.2 C)] 99 F (37.2 C) (08/28 1625) Pulse Rate:  [89-116] 109 (08/28 1625) Resp:  [11-21] 16 (08/28 1625) BP: (110-151)/(41-87) 133/61 mmHg (08/28 1625) SpO2:  [92 %-100 %] 100 % (08/28 1625) Weight:  [52.1 kg (114 lb 13.8 oz)-54.5 kg (120 lb 2.4 oz)] 52.1 kg (114 lb 13.8 oz) (08/28 1625) Last BM Date: 06/23/13  This is a healthy-appearing, pleasant, articulate female who is in no distress at the present time. Chest, heart, and general appearance are unremarkable. Her abdomen is nondistended, has quiet bowel sounds, no significant tympany or distention, and some mild but reproducible tenderness in the left upper quadrant and left lower quadrant. No organomegaly or mass effect are appreciated.  Intake/Output from previous day: 08/27 0701 - 08/28 0700 In: 2314  [P.O.:920; I.V.:554; IV Piggyback:840] Out: 4250 [Urine:4250] Intake/Output this shift: Total I/O In: 540 [P.O.:240; IV Piggyback:300] Out: -   Lab Results:  Recent Labs  06/22/13 0020 06/22/13 0500 06/23/13 0450  WBC 9.4 9.3 15.0*  HGB 10.6* 10.7* 11.0*  HCT 29.4* 29.5* 30.8*  PLT 191 176 196   BMET  Recent Labs  06/22/13 0500 06/22/13 1900 06/23/13 0450  NA 137 135 137  K 2.2* 3.6 3.1*  CL 110 104 103  CO2 16* 21 22  GLUCOSE 136* 125* 134*  BUN 6 3* 4*  CREATININE 0.41* 0.41* 0.41*  CALCIUM 6.2* 7.6* 7.9*   LFT  Recent Labs  06/23/13 0450  PROT 5.7*  ALBUMIN 2.9*  AST 39*  ALT 31  ALKPHOS 68  BILITOT 0.2*   PT/INR  Recent Labs  06/21/13 2255  LABPROT 14.9  INR 1.20     Studies/Results: Dg Chest Port 1 View  06/23/2013   *RADIOLOGY REPORT*  Clinical Data: Shortness of breath.  PORTABLE CHEST - 1 VIEW  Comparison: 06/22/2013.  Findings: The endotracheal tube and NG tubes have been removed. The right IJ catheter is stable.  The cardiac silhouette, mediastinal and hilar contours are normal and the lungs are clear except for minimal bibasilar atelectasis.  No pulmonary edema.  IMPRESSION:  1.  Removal of ET and NG tubes. 2.  Minimal streaky bibasilar atelectasis.   Original Report Authenticated By: Rudie Meyer, M.D.   Dg Chest Port 1 View  06/22/2013   *RADIOLOGY REPORT*  Clinical Data: Evaluate lung fields, endotracheal tube position  PORTABLE CHEST - 1 VIEW  Comparison: Prior chest x-ray 06/21/2013  Findings: Although the endotracheal tube has will been withdrawn slightly, the tip remains at the level of the carina and just within the right mainstem bronchus.  Recommend withdrawing at 3 cm for more optimal  positioning.  Stable right IJ vascular sheath with the tip in the mid SVC.  The nasogastric tube tip projects over the gastric fundus in the left upper quadrant.  Nonspecific left basilar opacity with some evidence of volume loss, fever atelectasis.  No  pulmonary edema.  Stable cardiac and mediastinal contours.  Atherosclerotic calcifications noted in the transverse aorta.  No acute osseous abnormality.  IMPRESSION:  1.  The tip of the endotracheal tube remains at the level of the carina and is directed just into the right mainstem bronchus. Recommend withdrawing 3 cm for more optimal positioning.  2.  Nasogastric tube has been placed.  The tip is within the gastric fundus.  3.  Left basilar atelectasis versus infiltrate, atelectasis is favored.  These results were called by telephone on 05/22/2013 at 07:55 a.m. to the patient's nurse, Raynelle Fanning, who verbally acknowledged these results.   Original Report Authenticated By: Malachy Moan, M.D.    Impression: It would appear that her diarrhea and rectal bleeding are most likely a consequence, rather than a cause, of the severe hypotensive episode, with probably some transient colonic ischemia resulting from the hypotension. There is no compelling finding of inflammatory bowel disease on her CT scan, which I have reviewed, nor does she have prodromal symptomatology to suggest underlying IBD.  Plan: I do feel that elective outpatient colonoscopy is probably prudent.   I will plan to see the patient in the office (an appointment has been given to her for September 25), at which time I will probably update a Hemoccult test and tentatively plan updated colonoscopic evaluation.   I have advised the patient to call me in the meantime in the event of worsened symptoms.   You might consider the use of a probiotic such as FloraStor for a couple of weeks to help with her diarrhea, although given the absence of antecedent diarrheal problems, prior to this illness, I anticipate that her bowel habits will probably normalize spontaneously.  I will sign off the patient at this time, but please call if questions or if I can be of further assistance in her care while she is in the hospital.    LOS: 2 days    Keysha Damewood V  06/23/2013, 6:56 PM

## 2013-06-23 NOTE — Progress Notes (Signed)
PULMONARY  / CRITICAL CARE MEDICINE  Name: [name] MRN: [id number] DOB: August 05, 1946    ADMISSION DATE:  06/21/2013   REFERRING MD :  EDP PRIMARY SERVICE: CCM  CHIEF COMPLAINT:Sepsis   BRIEF PATIENT DESCRIPTION: 67 y/o F who presented to [facility] ER on 8/26 with profound bradycardia (30's), hypothermia (93), hypotension & bloody diarrhea.    SIGNIFICANT EVENTS / STUDIES:  8/26 - admit with shock, bloody diarrhea, profound bradycardia (30's), hypothermia (93), hypotension 8/26 echo Pa pressure 51, ef wnl, grade 1 diastolic 8/27- off pressors, awake, on low O2 needs 8/27-extubated  LINES / TUBES: Right IJ Cordis/CVC 8/26>>>8/28 (planned) R Femoral A-line >>>8/27 ETT 8/26>>>8/27  CULTURES: Blood Cultures x 2 8/26>>> Sputum for Cx and GS 8/26>>> UA 8/26>>> Stool entric 8/28>>>  ANTIBIOTICS: Rocephin 8/26>>> Doxycycline x 1 dose 8/26 Flagyl 8/26 >>> Vanc 8/26 >>>8/27  SUBJECTIVE:  Extubating, doing well, clear liquids  VITAL SIGNS: Temp:  [97.8 F (36.6 C)-98.4 F (36.9 C)] 98.4 F (36.9 C) (08/28 0400) Pulse Rate:  [85-106] 93 (08/28 0700) Resp:  [10-25] 16 (08/28 0700) BP: (104-138)/(41-77) 119/49 mmHg (08/28 0700) SpO2:  [92 %-100 %] 97 % (08/28 0700) Arterial Line BP: (119-138)/(60-69) 119/61 mmHg (08/27 1105) Weight:  [54.5 kg (120 lb 2.4 oz)] 54.5 kg (120 lb 2.4 oz) (08/28 0416)  HEMODYNAMICS:    VENTILATOR SETTINGS:    INTAKE / OUTPUT: Intake/Output     08/27 0701 - 08/28 0700 08/28 0701 - 08/29 0700   P.O. 920    I.V. (mL/kg) 554 (10.2)    IV Piggyback 840    Total Intake(mL/kg) 2314 (42.5)    Urine (mL/kg/hr) 4250 (3.2)    Total Output 4250     Net -1936          Urine Occurrence 2 x    Stool Occurrence 8 x      PHYSICAL EXAMINATION: General:  Awake, alert, nonfocal Neuro: Awake, Alert, Following commands. HEENT: jvd up Cardiovascular: S1, S2 rrr Lungs: Clear Abdomen: Soft and flat, hyperactive bowel sounds.now NON  tender Musculoskeletal:  Joint deformities noted in hands, mcp, dip both Skin: Intact. No rash noted.  LABS:  CBC Recent Labs     06/22/13  0020  06/22/13  0500  06/23/13  0450  WBC  9.4  9.3  15.0*  HGB  10.6*  10.7*  11.0*  HCT  29.4*  29.5*  30.8*  PLT  191  176  196   Coag's Recent Labs     06/21/13  2255  APTT  26  INR  1.20   BMET Recent Labs     06/22/13  0500  06/22/13  1900  06/23/13  0450  NA  137  135  137  K  2.2*  3.6  3.1*  CL  110  104  103  CO2  16*  21  22  BUN  6  3*  4*  CREATININE  0.41*  0.41*  0.41*  GLUCOSE  136*  125*  134*   Electrolytes Recent Labs     06/21/13  2255  06/22/13  0020  06/22/13  0500  06/22/13  1900  06/23/13  0450  CALCIUM  7.3*   --   6.2*  7.6*  7.9*  MG  1.9  1.4*   --    --    --    Sepsis Markers Recent Labs     06/21/13  2255  PROCALCITON  0.29   ABG Recent Labs  06/22/13  0520  06/22/13  0636  06/22/13  0929  PHART  7.507*  7.436  7.411  PCO2ART  21.4*  27.8*  32.3*  PO2ART  177.0*  138.0*  126.0*   Liver Enzymes Recent Labs     06/21/13  1215  06/21/13  2255  06/23/13  0450  AST  27  40*  39*  ALT  18  34  31  ALKPHOS  57  34*  68  BILITOT  0.2*  0.1*  0.2*  ALBUMIN  4.1  2.7*  2.9*   Cardiac Enzymes Recent Labs     06/21/13  1215  06/21/13  2205  06/22/13  1215  TROPONINI  <0.30  <0.30  <0.30   Glucose Recent Labs     06/21/13  2048  06/22/13  0005  06/22/13  0402  06/22/13  0750  06/22/13  1141  06/22/13  1537  GLUCAP  198*  114*  139*  138*  119*  221*    Imaging Ct Head Wo Contrast  06/21/2013   CLINICAL DATA:  Nausea, vomiting, hypotension.  EXAM: CT HEAD WITHOUT CONTRAST  TECHNIQUE: Contiguous axial images were obtained from the base of the skull through the vertex without intravenous contrast.  COMPARISON:  MRI 12/07/2012  FINDINGS: No acute intracranial abnormality. Specifically, no hemorrhage, hydrocephalus, mass lesion, acute infarction, or significant  intracranial injury. No acute calvarial abnormality. Visualized paranasal sinuses and mastoids clear. Orbital soft tissues unremarkable.  IMPRESSION: No acute intracranial abnormality.   Electronically Signed   By: Charlett Nose   On: 06/21/2013 12:54   Dg Chest Port 1 View  06/23/2013   *RADIOLOGY REPORT*  Clinical Data: Shortness of breath.  PORTABLE CHEST - 1 VIEW  Comparison: 06/22/2013.  Findings: The endotracheal tube and NG tubes have been removed. The right IJ catheter is stable.  The cardiac silhouette, mediastinal and hilar contours are normal and the lungs are clear except for minimal bibasilar atelectasis.  No pulmonary edema.  IMPRESSION:  1.  Removal of ET and NG tubes. 2.  Minimal streaky bibasilar atelectasis.   Original Report Authenticated By: Rudie Meyer, M.D.   Dg Chest Port 1 View  06/22/2013   *RADIOLOGY REPORT*  Clinical Data: Evaluate lung fields, endotracheal tube position  PORTABLE CHEST - 1 VIEW  Comparison: Prior chest x-ray 06/21/2013  Findings: Although the endotracheal tube has will been withdrawn slightly, the tip remains at the level of the carina and just within the right mainstem bronchus.  Recommend withdrawing at 3 cm for more optimal positioning.  Stable right IJ vascular sheath with the tip in the mid SVC.  The nasogastric tube tip projects over the gastric fundus in the left upper quadrant.  Nonspecific left basilar opacity with some evidence of volume loss, fever atelectasis.  No pulmonary edema.  Stable cardiac and mediastinal contours.  Atherosclerotic calcifications noted in the transverse aorta.  No acute osseous abnormality.  IMPRESSION:  1.  The tip of the endotracheal tube remains at the level of the carina and is directed just into the right mainstem bronchus. Recommend withdrawing 3 cm for more optimal positioning.  2.  Nasogastric tube has been placed.  The tip is within the gastric fundus.  3.  Left basilar atelectasis versus infiltrate, atelectasis is  favored.  These results were called by telephone on 05/22/2013 at 07:55 a.m. to the patient's nurse, Raynelle Fanning, who verbally acknowledged these results.   Original Report Authenticated By: Malachy Moan, M.D.   Dg  Chest Portable 1 View  06/21/2013   CLINICAL DATA:  Line placement  EXAM: PORTABLE CHEST - 1 VIEW  COMPARISON:  06/21/2013  FINDINGS: Endotracheal tube is within the right mainstem bronchus and should be retracted approximately 3-4 cm. Right central line tip in the SVC. No pneumothorax. Slight increase left basilar opacity, likely atelectasis. Right lung is clear. No visible effusions.  IMPRESSION: Right mainstem intubation. Recommend retracting endotracheal tube 3-4 cm.  Right central line in the SVC. No pneumothorax.  Left base atelectasis.  These results will be called to the ordering clinician or representative by the Radiologist Assistant, and communication documented in the PACS Dashboard.   Electronically Signed   By: Charlett Nose   On: 06/21/2013 15:48   Dg Chest Port 1 View  06/21/2013   CLINICAL DATA:  Bradycardia, nausea.  EXAM: PORTABLE CHEST - 1 VIEW  COMPARISON:  02/25/2005.  FINDINGS: The heart size and mediastinal contours are within normal limits. Both lungs are clear. The visualized skeletal structures are unremarkable.  IMPRESSION: No active disease.   Electronically Signed   By: Charlett Nose   On: 06/21/2013 12:45   Ct Angio Chest Aorta W/cm &/or Wo/cm  06/21/2013   *RADIOLOGY REPORT*  Clinical Data:  Hypotensive, hypothermic  CT ANGIOGRAPHY CHEST, ABDOMEN AND PELVIS  Technique:  Multidetector CT imaging through the chest, abdomen and pelvis was performed using the standard protocol during bolus administration of intravenous contrast.  Multiplanar reconstructed images including MIPs were obtained and reviewed to evaluate the vascular anatomy.  Contrast: OMNIPAQUE IOHEXOL 350 MG/ML SOLN  Comparison:  None.  CTA CHEST  Findings:  No evidence of intramural hematoma.  No  evidence of thoracic aortic aneurysm or dissection.  No evidence of pulmonary embolism.  Mild centrilobular emphysematous changes.  No suspicious pulmonary nodules. No pleural effusion or pneumothorax.  Visualized thyroid is unremarkable.  The heart is top normal in size.  No pericardial effusion.  Mild atherosclerotic calcifications of the aortic arch.  No suspicious mediastinal, hilar, or axillary lymphadenopathy.  Mild degenerative changes of the thoracic spine.   Review of the MIP images confirms the above findings.  IMPRESSION: No evidence of thoracic aortic aneurysm or dissection.  No evidence of pulmonary embolism.  Mild emphysematous changes.  CTA ABDOMEN AND PELVIS  Findings:  No evidence of abdominal aortic aneurysm or dissection.  Celiac artery, SMA, and IMA are patent.  Atherosclerotic calcifications of the aorta and branch vessels.  Liver, spleen, pancreas, and adrenal glands are within normal limits.  Gallbladder is contracted.  No intrahepatic or extrahepatic ductal dilatation.  Kidneys enhance symmetrically and are within normal limits.  No hydronephrosis.  No evidence of bowel obstruction.  Appendix is not discretely visualized and may be surgically absent.  Extensive colonic diverticulosis, without evidence of diverticulitis.  Mild mucosal hyperenhancement involving the terminal ileum (series 5/image 142) and possibly the sigmoid colon (series 5/image 155), possibly reflecting infectious/inflammatory enterocolitis, although without convincing surrounding inflammatory changes.  No abdominopelvic ascites. No free air.  No suspicious abdominopelvic lymphadenopathy.  Uterus and bilateral ovaries are unremarkable.  Focal wall thickening involving the anterior bladder dome (series 5/image 170).  Degenerative changes of the lumbar spine, most prominent at L2-3. Grade 1 anterolisthesis of L5 on S1.   Review of the MIP images confirms the above findings.  IMPRESSION: No evidence of abdominal aortic aneurysm  or dissection.  Colonic diverticulosis, without evidence of diverticulitis.  Possible mucosal hyperenhancement involving the terminal ileum and sigmoid colon, equivocal.  Correlate for infectious/inflammatory enterocolitis.  No free air.  Focal wall thickening involving the anterior bladder dome. Cystoscopic correlation is suggested.   Original Report Authenticated By: Charline Bills, M.D.   Ct Angio Abd/pel W/ And/or W/o  06/21/2013   *RADIOLOGY REPORT*  Clinical Data:  Hypotensive, hypothermic  CT ANGIOGRAPHY CHEST, ABDOMEN AND PELVIS  Technique:  Multidetector CT imaging through the chest, abdomen and pelvis was performed using the standard protocol during bolus administration of intravenous contrast.  Multiplanar reconstructed images including MIPs were obtained and reviewed to evaluate the vascular anatomy.  Contrast: OMNIPAQUE IOHEXOL 350 MG/ML SOLN  Comparison:  None.  CTA CHEST  Findings:  No evidence of intramural hematoma.  No evidence of thoracic aortic aneurysm or dissection.  No evidence of pulmonary embolism.  Mild centrilobular emphysematous changes.  No suspicious pulmonary nodules. No pleural effusion or pneumothorax.  Visualized thyroid is unremarkable.  The heart is top normal in size.  No pericardial effusion.  Mild atherosclerotic calcifications of the aortic arch.  No suspicious mediastinal, hilar, or axillary lymphadenopathy.  Mild degenerative changes of the thoracic spine.   Review of the MIP images confirms the above findings.  IMPRESSION: No evidence of thoracic aortic aneurysm or dissection.  No evidence of pulmonary embolism.  Mild emphysematous changes.  CTA ABDOMEN AND PELVIS  Findings:  No evidence of abdominal aortic aneurysm or dissection.  Celiac artery, SMA, and IMA are patent.  Atherosclerotic calcifications of the aorta and branch vessels.  Liver, spleen, pancreas, and adrenal glands are within normal limits.  Gallbladder is contracted.  No intrahepatic or  extrahepatic ductal dilatation.  Kidneys enhance symmetrically and are within normal limits.  No hydronephrosis.  No evidence of bowel obstruction.  Appendix is not discretely visualized and may be surgically absent.  Extensive colonic diverticulosis, without evidence of diverticulitis.  Mild mucosal hyperenhancement involving the terminal ileum (series 5/image 142) and possibly the sigmoid colon (series 5/image 155), possibly reflecting infectious/inflammatory enterocolitis, although without convincing surrounding inflammatory changes.  No abdominopelvic ascites. No free air.  No suspicious abdominopelvic lymphadenopathy.  Uterus and bilateral ovaries are unremarkable.  Focal wall thickening involving the anterior bladder dome (series 5/image 170).  Degenerative changes of the lumbar spine, most prominent at L2-3. Grade 1 anterolisthesis of L5 on S1.   Review of the MIP images confirms the above findings.  IMPRESSION: No evidence of abdominal aortic aneurysm or dissection.  Colonic diverticulosis, without evidence of diverticulitis.  Possible mucosal hyperenhancement involving the terminal ileum and sigmoid colon, equivocal.  Correlate for infectious/inflammatory enterocolitis.  No free air.  Focal wall thickening involving the anterior bladder dome. Cystoscopic correlation is suggested.   Original Report Authenticated By: Charline Bills, M.D.     pcxr-line nwl, no infiltrates  ASSESSMENT / PLAN:  PULMONARY A: Acute Respiratory Failure - in the setting of metabolic derangement / septic shock. 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-resolved P:   -IS -No O2 needs, increase activity  CARDIOVASCULAR A:  Shock - thought septic with possible abdominal source but hiking hx raises concern for tertiary lyme disease Bradycardia-resolved R/o mycoacrditis, not noted on echo R/o ischemia, none noted R/o auto immune pulm HTN driven MET criteria for rel AI  PLAN -TSH  - 0.29, see endocrine -Echo  reviewed, will need work up pulm pressures, hiv sent, concern would be autoimmune in nature given history arthritis and possible RA per husband - RF neg, C3 C4 low, await esr, dsdna, ana, crp -slow taper over 1 week for steroids  Keep tele  RENAL A:   At risk AKI - in setting of GI loss / contrast administration  Gap / Non-Gap Acidosis-resolving Hypokalemia severe P:   -follow BMP in am -kvo -replace K  GASTROINTESTINAL A:   Diarrhea  - bloody diarrhea in ER, concern for ischemia vs infectious etiology vs IBD associated arthritis Abd Pain  P:   -pepcid -advance to fulls  HEMATOLOGIC A:   Leukocytosis Some hemoconcentration last 24 hrs P:  -follow CBC in am further -HOLD heparin / ASA in setting of bleed, SCD's for DVT proph  INFECTIOUS A:   Diarrhea, r/o E coli, o157, r./;o enterics, shig, salm etc, campilobater R/o associated RA, inflammatory  unlikley Lyme P:   -assess auto immune panel-pending remainder, some concern compliment low -ID running abx -dc line  ENDOCRINE A:    R/o over treated hypothyroidism vs sick euthyroid on top, rel AI P:   -tsh noted, hold home synthroid, asses t3 t4 -empiric steroids reduce slowly  NEUROLOGIC A:   Chronic Pain - back Pre-Syncope - prior to admit, dizzy, lightheaded.  ??r/t meds vs volume Unlikely meningitis, enceph P:   -uproght Pt consult  To floor, tele, had brady   Mcarthur Rossetti. Tyson Alias, MD, FACP Pgr: [phone] [facility]  06/23/2013, 8:55 AM

## 2013-06-23 NOTE — Progress Notes (Signed)
INFECTIOUS DISEASE ATTENDING ADDENDUM:     [facility]   Date: 06/23/2013  Patient name: [name]  Medical record number: [id number]  Date of birth: [birth year]    This patient has been seen and discussed with the house staff. Please see their note for complete details. I concur with their findings with the following additions/corrections:  Patient with worsening diarrhea up to 5 bowel movements yesterday and for this morning.  I will place her on enteric contact her cautions, and would make sure these remain  Even if her C. difficile PCR is negative  I was told that she was already tested negative for C diff pcr but CANNOT locate this result in the computer  Followup GI pathogen panel, stool culture, giardia, agree GI consult  Dr. Ninetta Lights will pick up service in the am.  Paulette Blanch Dam 06/23/2013, 5:03 PM

## 2013-06-23 NOTE — Progress Notes (Signed)
PT Cancellation/Discharge Note  Patient Details Name: [name] MRN: [id number] DOB: 1946-01-23   Cancelled Treatment:    Reason Eval/Treat Not Completed: PT screened, no needs identified, will sign off   Charbel Los B. Kallan Bischoff, PT, DPT [phone]   06/23/2013, 5:20 PM

## 2013-06-23 NOTE — Progress Notes (Signed)
1620 Transferred in from 2900 via  Wheelchair. CMT aware of pt. Kept comfortable in bed . Family member in attendance . Orientation to unit and unit protocol provided

## 2013-06-23 NOTE — Progress Notes (Signed)
[facility] ADULT ICU REPLACEMENT PROTOCOL FOR AM LAB REPLACEMENT ONLY  The patient does apply for the [facility] Adult ICU Electrolyte Replacment Protocol based on the criteria listed below:   1. Is GFR >/= 40 ml/min? yes  Patient's GFR today is >90 2. Is urine output >/= 0.5 ml/kg/hr for the last 6 hours? yes Patient's UOP is 2.63 ml/kg/hr 3. Is BUN < 60 mg/dL? yes  Patient's BUN today is 4 4. Abnormal electrolyte K 3.1 5. Ordered repletion with: per protocal 6. If a panic level lab has been reported, has the CCM MD in charge been notified? yes.   Physician:  Mar Daring G Pilkington-Burchett 06/23/2013 6:01 AM

## 2013-06-24 DIAGNOSIS — A419 Sepsis, unspecified organism: Secondary | ICD-10-CM

## 2013-06-24 LAB — BASIC METABOLIC PANEL
BUN: 5 mg/dL — ABNORMAL LOW (ref 6–23)
CO2: 23 mEq/L (ref 19–32)
Chloride: 100 mEq/L (ref 96–112)
Creatinine, Ser: 0.41 mg/dL — ABNORMAL LOW (ref 0.50–1.10)
Glucose, Bld: 115 mg/dL — ABNORMAL HIGH (ref 70–99)

## 2013-06-24 LAB — GI PATHOGEN PANEL BY PCR, STOOL
C difficile toxin A/B: NEGATIVE
Campylobacter by PCR: NEGATIVE
Cryptosporidium by PCR: NEGATIVE
E coli 0157 by PCR: NEGATIVE
G lamblia by PCR: NEGATIVE

## 2013-06-24 LAB — CBC WITH DIFFERENTIAL/PLATELET
Basophils Absolute: 0 10*3/uL (ref 0.0–0.1)
Eosinophils Relative: 0 % (ref 0–5)
HCT: 32.7 % — ABNORMAL LOW (ref 36.0–46.0)
Lymphocytes Relative: 6 % — ABNORMAL LOW (ref 12–46)
MCV: 86.1 fL (ref 78.0–100.0)
Monocytes Absolute: 1 10*3/uL (ref 0.1–1.0)
RDW: 15.8 % — ABNORMAL HIGH (ref 11.5–15.5)
WBC: 17 10*3/uL — ABNORMAL HIGH (ref 4.0–10.5)

## 2013-06-24 LAB — CULTURE, RESPIRATORY W GRAM STAIN

## 2013-06-24 MED ORDER — METRONIDAZOLE 500 MG PO TABS
500.0000 mg | ORAL_TABLET | Freq: Three times a day (TID) | ORAL | Status: DC
  Administered 2013-06-24 – 2013-06-26 (×6): 500 mg via ORAL
  Filled 2013-06-24 (×9): qty 1

## 2013-06-24 MED ORDER — POTASSIUM CHLORIDE CRYS ER 20 MEQ PO TBCR
EXTENDED_RELEASE_TABLET | ORAL | Status: AC
  Administered 2013-06-24: 40 meq via ORAL
  Filled 2013-06-24: qty 2

## 2013-06-24 MED ORDER — FAMOTIDINE 20 MG PO TABS
20.0000 mg | ORAL_TABLET | Freq: Every day | ORAL | Status: DC
  Administered 2013-06-24 – 2013-06-25 (×2): 20 mg via ORAL
  Filled 2013-06-24 (×3): qty 1

## 2013-06-24 MED ORDER — POTASSIUM CHLORIDE CRYS ER 20 MEQ PO TBCR
40.0000 meq | EXTENDED_RELEASE_TABLET | ORAL | Status: AC
  Administered 2013-06-24 (×3): 40 meq via ORAL
  Filled 2013-06-24 (×3): qty 2

## 2013-06-24 NOTE — Progress Notes (Signed)
PULMONARY  / CRITICAL CARE MEDICINE  Name: [name] MRN: [id number] DOB: Feb 25, 1946    ADMISSION DATE:  06/21/2013   REFERRING MD :  EDP PRIMARY SERVICE: CCM  CHIEF COMPLAINT:Sepsis   BRIEF PATIENT DESCRIPTION: 67 y/o F who presented to [facility] ER on 8/26 with profound bradycardia (30's), hypothermia (93), hypotension & bloody diarrhea.    SIGNIFICANT EVENTS / STUDIES:  8/26 - admit with shock, bloody diarrhea, profound bradycardia (30's), hypothermia (93), hypotension 8/26 echo Pa pressure 51, ef wnl, grade 1 diastolic 8/27- off pressors, awake, on low O2 needs 8/27-extubated  LINES / TUBES: Right IJ Cordis/CVC 8/26>>>8/28 (planned) R Femoral A-line >>>8/27 ETT 8/26>>>8/27  CULTURES: Blood Cultures x 2 8/26>>>1/2 GPC>>> Sputum for Cx and GS 8/26>>>neg UA 8/26>>>neg  Stool entric 8/28>>> Cdiff: neg  ANTIBIOTICS: Rocephin 8/26>>> Doxycycline x 1 dose 8/26 Flagyl 8/26 >>> Vanc 8/26 >>>8/27  SUBJECTIVE:  Extubating, doing well, clear liquids  VITAL SIGNS: Temp:  [98.5 F (36.9 C)-99 F (37.2 C)] 98.6 F (37 C) (08/29 0610) Pulse Rate:  [90-116] 103 (08/29 0610) Resp:  [13-20] 16 (08/29 0610) BP: (112-151)/(57-87) 128/66 mmHg (08/29 0610) SpO2:  [97 %-100 %] 99 % (08/29 0610) Weight:  [50.2 kg (110 lb 10.7 oz)-52.1 kg (114 lb 13.8 oz)] 50.2 kg (110 lb 10.7 oz) (08/29 0610)  HEMODYNAMICS:    VENTILATOR SETTINGS:    INTAKE / OUTPUT: Intake/Output     08/28 0701 - 08/29 0700 08/29 0701 - 08/30 0700   P.O. 600    I.V. (mL/kg)     IV Piggyback 550    Total Intake(mL/kg) 1150 (22.9)    Urine (mL/kg/hr)     Stool 150 (0.1)    Total Output 150     Net +1000          Urine Occurrence 8 x    Stool Occurrence 8 x      PHYSICAL EXAMINATION: General:  Awake, alert, nonfocal Neuro: Awake, Alert, Following commands. HEENT: [place] no JVD Cardiovascular: S1, S2 rrr Lungs: Clear Abdomen: Soft and flat, hyperactive bowel sounds.now NON tender Musculoskeletal:   Joint deformities noted in hands, mcp, dip both Skin: Intact. No rash noted.  LABS:  CBC Recent Labs     06/22/13  0500  06/23/13  0450  06/24/13  0555  WBC  9.3  15.0*  17.0*  HGB  10.7*  11.0*  11.6*  HCT  29.5*  30.8*  32.7*  PLT  176  196  233   Coag's Recent Labs     06/21/13  2255  APTT  26  INR  1.20   BMET Recent Labs     06/22/13  1900  06/23/13  0450  06/24/13  0555  NA  135  137  138  K  3.6  3.1*  2.7*  CL  104  103  100  CO2  21  22  23   BUN  3*  4*  5*  CREATININE  0.41*  0.41*  0.41*  GLUCOSE  125*  134*  115*   Electrolytes Recent Labs     06/21/13  2255  06/22/13  0020   06/22/13  1900  06/23/13  0450  06/24/13  0555  CALCIUM  7.3*   --    < >  7.6*  7.9*  8.5  MG  1.9  1.4*   --    --    --    --    < > = values in this  interval not displayed.   Sepsis Markers Recent Labs     06/21/13  2255  PROCALCITON  0.29   ABG Recent Labs     06/22/13  0520  06/22/13  0636  06/22/13  0929  PHART  7.507*  7.436  7.411  PCO2ART  21.4*  27.8*  32.3*  PO2ART  177.0*  138.0*  126.0*   Liver Enzymes Recent Labs     06/21/13  1215  06/21/13  2255  06/23/13  0450  AST  27  40*  39*  ALT  18  34  31  ALKPHOS  57  34*  68  BILITOT  0.2*  0.1*  0.2*  ALBUMIN  4.1  2.7*  2.9*   Cardiac Enzymes Recent Labs     06/21/13  1215  06/21/13  2205  06/22/13  1215  TROPONINI  <0.30  <0.30  <0.30   Glucose Recent Labs     06/21/13  2048  06/22/13  0005  06/22/13  0402  06/22/13  0750  06/22/13  1141  06/22/13  1537  GLUCAP  198*  114*  139*  138*  119*  221*    Imaging Dg Chest Port 1 View  06/23/2013   *RADIOLOGY REPORT*  Clinical Data: Shortness of breath.  PORTABLE CHEST - 1 VIEW  Comparison: 06/22/2013.  Findings: The endotracheal tube and NG tubes have been removed. The right IJ catheter is stable.  The cardiac silhouette, mediastinal and hilar contours are normal and the lungs are clear except for minimal bibasilar atelectasis.   No pulmonary edema.  IMPRESSION:  1.  Removal of ET and NG tubes. 2.  Minimal streaky bibasilar atelectasis.   Original Report Authenticated By: Rudie Meyer, M.D.     pcxr-line nwl, no infiltrates  ASSESSMENT / PLAN:  Infectious diarrhea/ enterocolitis.  > r/o E coli, o157, r./;o enterics, shig, salm etc, campylobacter, so far pending.  > associated inflammatory bowel disease seems less likely as work-up to date negative and GI also thinks not likely.  > unlikley Lyme P:   Cont current abx F/u out-pt GI  Add floraq Adv diet slow  F/u labs.   Hypokalemia severe P: Replace/recheck  Sick euthyroidism  Rel AI P:   -f/u thyroid pnl out-pt Wean steroids.   Chronic Pain - back P:   Resume home rx     Billy Fischer, MD ; [facility] service Mobile [phone].  After 5:30 PM or weekends, call [phone]

## 2013-06-24 NOTE — Progress Notes (Signed)
CRITICAL VALUE ALERT  Critical value received: Potassium 2.7  Date of notification:  06/24/2013  Time of notification 0835 am  Critical value read back: yes  Nurse who received alert:  Clarene Reamer Sharron Simpson  MD notified (1st page):  Md on call  pccm  Time of first page:  [phone]  MD notified (2nd page):  Time of second page:  Responding MD: DR Tyson Alias  Time MD responded: [phone]

## 2013-06-24 NOTE — Progress Notes (Signed)
Received a call from Lab dept, blood culture came up gram positive cocci in clusters, notified Dr. Darrick Penna. Will continue to monitor pt

## 2013-06-24 NOTE — Progress Notes (Signed)
INFECTIOUS DISEASE PROGRESS NOTE  ID: [name] is a 67 y.o. female with  Active Problems:   Septic shock(785.52)   Acute respiratory failure with hypoxia   Bradycardia  Subjective: Feels better. 1 BM today (10 over last 24h).   Abtx:  Anti-infectives   Start     Dose/Rate Route Frequency Ordered Stop   06/22/13 1400  vancomycin (VANCOCIN) IVPB 750 mg/150 ml premix  Status:  Discontinued     750 mg 150 mL/hr over 60 Minutes Intravenous Every 24 hours 06/21/13 1422 06/22/13 0839   06/22/13 1400  cefTRIAXone (ROCEPHIN) 1 g in dextrose 5 % 50 mL IVPB     1 g 100 mL/hr over 30 Minutes Intravenous Every 24 hours 06/22/13 0839     06/22/13 1000  vancomycin (VANCOCIN) 500 mg in sodium chloride 0.9 % 100 mL IVPB  Status:  Discontinued     500 mg 100 mL/hr over 60 Minutes Intravenous Every 12 hours 06/22/13 0839 06/22/13 1313   06/21/13 2000  metroNIDAZOLE (FLAGYL) IVPB 500 mg     500 mg 100 mL/hr over 60 Minutes Intravenous Every 6 hours 06/21/13 1843     06/21/13 1400  doxycycline (VIBRAMYCIN) 100 mg in dextrose 5 % 250 mL IVPB     100 mg 125 mL/hr over 120 Minutes Intravenous  Once 06/21/13 1332 06/21/13 1815   06/21/13 1400  vancomycin (VANCOCIN) IVPB 1000 mg/200 mL premix     1,000 mg 200 mL/hr over 60 Minutes Intravenous STAT 06/21/13 1357 06/21/13 1918   06/21/13 1400  metroNIDAZOLE (FLAGYL) IVPB 500 mg  Status:  Discontinued     500 mg 100 mL/hr over 60 Minutes Intravenous 4 times per day 06/21/13 1358 06/21/13 1843   06/21/13 1345  cefTRIAXone (ROCEPHIN) 2 g in dextrose 5 % 50 mL IVPB     2 g 100 mL/hr over 30 Minutes Intravenous  Once 06/21/13 1331 06/21/13 1425      Medications:  Scheduled: . antiseptic oral rinse  15 mL Mouth Rinse BID  . cefTRIAXone (ROCEPHIN)  IV  1 g Intravenous Q24H  . famotidine (PEPCID) IV  20 mg Intravenous Q12H  . hydrocortisone sodium succinate  25 mg Intravenous Q6H  . metronidazole  500 mg Intravenous Q6H  . potassium chloride  40  mEq Oral Q4H  . temazepam  15 mg Oral QHS    Objective: Vital signs in last 24 hours: Temp:  [98.5 F (36.9 C)-99 F (37.2 C)] 98.6 F (37 C) (08/29 0610) Pulse Rate:  [90-116] 103 (08/29 1111) Resp:  [13-17] 16 (08/29 1111) BP: (112-151)/(61-81) 117/73 mmHg (08/29 1111) SpO2:  [98 %-100 %] 100 % (08/29 1111) Weight:  [50.2 kg (110 lb 10.7 oz)-52.1 kg (114 lb 13.8 oz)] 50.2 kg (110 lb 10.7 oz) (08/29 0610)   General appearance: alert, cooperative and no distress Resp: clear to auscultation bilaterally Cardio: regular rate and rhythm GI: normal findings: bowel sounds normal and soft, non-tender  Lab Results  Recent Labs  06/23/13 0450 06/24/13 0555  WBC 15.0* 17.0*  HGB 11.0* 11.6*  HCT 30.8* 32.7*  NA 137 138  K 3.1* 2.7*  CL 103 100  CO2 22 23  BUN 4* 5*  CREATININE 0.41* 0.41*   Liver Panel  Recent Labs  06/21/13 2255 06/23/13 0450  PROT 4.8* 5.7*  ALBUMIN 2.7* 2.9*  AST 40* 39*  ALT 34 31  ALKPHOS 34* 68  BILITOT 0.1* 0.2*   Sedimentation Rate  Recent Labs  06/22/13  0900  ESRSEDRATE 4   C-Reactive Protein  Recent Labs  06/22/13 0900  CRP 6.1*    Microbiology: Recent Results (from the past 240 hour(s))  CULTURE, BLOOD (ROUTINE X 2)     Status: None   Collection Time    06/21/13  1:10 PM      Result Value Range Status   Specimen Description BLOOD LEFT FOREARM   Final   Special Requests BOTTLES DRAWN AEROBIC ONLY   Final   Culture  Setup Time     Final   Value: 06/21/2013 17:01     Performed at [facility]   Culture     Final   Value: GRAM POSITIVE COCCI IN CLUSTERS     Note: Gram Stain Report Called to,Read Back By and Verified With: Allyson Sabal [id number] BRMEL     Performed at [facility]   Report Status PENDING   Incomplete  CULTURE, BLOOD (ROUTINE X 2)     Status: None   Collection Time    06/21/13  1:15 PM      Result Value Range Status   Specimen Description BLOOD LEFT HAND   Final   Special  Requests BOTTLES DRAWN AEROBIC AND ANAEROBIC   Final   Culture  Setup Time     Final   Value: 06/21/2013 17:01     Performed at [facility]   Culture     Final   Value:        BLOOD CULTURE RECEIVED NO GROWTH TO DATE CULTURE WILL BE HELD FOR 5 DAYS BEFORE ISSUING A FINAL NEGATIVE REPORT     Performed at [facility]   Report Status PENDING   Incomplete  MRSA PCR SCREENING     Status: None   Collection Time    06/21/13  5:44 PM      Result Value Range Status   MRSA by PCR NEGATIVE  NEGATIVE Final   Comment:            The GeneXpert MRSA Assay (FDA     approved for NASAL specimens     only), is one component of a     comprehensive MRSA colonization     surveillance program. It is not     intended to diagnose MRSA     infection nor to guide or     monitor treatment for     MRSA infections.  URINE CULTURE     Status: None   Collection Time    06/21/13  6:39 PM      Result Value Range Status   Specimen Description URINE, CATHETERIZED   Final   Special Requests NONE   Final   Culture  Setup Time     Final   Value: 06/21/2013 19:05     Performed at [facility] Count     Final   Value: NO GROWTH     Performed at [facility]   Culture     Final   Value: NO GROWTH     Performed at [facility]   Report Status 06/22/2013 FINAL   Final  CULTURE, RESPIRATORY (NON-EXPECTORATED)     Status: None   Collection Time    06/22/13  7:49 AM      Result Value Range Status   Specimen Description TRACHEAL ASPIRATE   Final   Special Requests Normal   Final   Gram Stain     Final  Value: RARE WBC PRESENT, PREDOMINANTLY PMN     NO ORGANISMS SEEN     Performed at [facility]   Culture     Final   Value: NO GROWTH 2 DAYS     Performed at [facility]   Report Status 06/24/2013 FINAL   Final    Studies/Results: Dg Chest Port 1 View  06/23/2013   *RADIOLOGY REPORT*  Clinical Data: Shortness of breath.  PORTABLE CHEST -  1 VIEW  Comparison: 06/22/2013.  Findings: The endotracheal tube and NG tubes have been removed. The right IJ catheter is stable.  The cardiac silhouette, mediastinal and hilar contours are normal and the lungs are clear except for minimal bibasilar atelectasis.  No pulmonary edema.  IMPRESSION:  1.  Removal of ET and NG tubes. 2.  Minimal streaky bibasilar atelectasis.   Original Report Authenticated By: Rudie Meyer, M.D.     Assessment/Plan: Diarrhea  ? enterocolitis  Total days of antibiotics: 4 (ceftriaxone/flagyl)  Await GI pathogens panel still pending... C diff pending No change in anbx for now.  Would MRI of abd be helpful to look for ischemic colitis?         Johny Sax Infectious Diseases (pager) [phone] [email or web address] 06/24/2013, 12:41 PM  LOS: 3 days

## 2013-06-24 NOTE — Discharge Summary (Signed)
Physician Discharge Summary     Patient ID: [name] MRN: [id number] DOB/AGE: 1945/12/30 67 y.o.  Admit date: 06/21/2013 Discharge date: 06/26/2013  Discharge Diagnoses:  Acute Respiratory Failure - in the setting of metabolic derangement / septic shock.(resolved) Former Smoker  Uncompensated met acidosis-(resolved)  Infectious enterocolitis/ infectious diarrhea.  Septic Shock  (resolved) Bradycardia-(resolved) PAH  Relative adrenal insufficiency  Sick euthyroid   Detailed Hospital Course:   67 year old caucasion female who was in her usual state of health until 8/26, when she developed abdominal cramping and diarrhea, slurred speech and dizziness per her husband. Per husband she called him and was unable to clearly speak and he activated EMS. She was in her ususal state of health on 8/25. Is known to be an avid hiker but has not been able to as of late due to back pain (followed by Dr. Ollen Bowl). Notes recent travel to [place], [place] and [place]. No known tick bites. Admitted to hospital in 11/2012 for ? TIA symptoms. Since that time has had more frequent headaches. Normally is chronically constipated in the setting of narcotic use but woke this am with diarrhea. Husband denies known patient use of drugs / ETOH.   Admitted to the Intensive care with septic shock (in setting of infectious enterocolitis), bradycardia, hypothermia, metabolic acidosis, and acute encephalopathy. Blood, urine and stool cultures were sent including: Cdiff PCR, Giardia/cryptosporidium,  And stool culture. ECHO was ordered which showed: Pa pressure 51, ef wnl, grade 1 diastolic dysfxn. Treatment included: aggressive volume resuscitation, central access, intubation in setting of metabolic acidosis and shock, bicarbonate infusion, vasoactive gtts and empiric antibiotics. She improved clinically with these measures and by 8/27 she was off pressors and extubated. She had both ID and GI consultation. Part of evaluation included:  auto-immune work-up with concern for inflammatory bowel disease, as per husband there was concern at some point about possibility of RA. Her RF was negative, C3 and C4 were low, ESR 4, CRP 6.1, ANA: negative, AntiDNA antibodu, double stranded: negative. GI consultation by Dr Matthias Hughs, who reviewed the CT scan and interviewed the patient were as follows:  "  felt that the her diarrhea and rectal bleeding are most likely a consequence, rather than a cause, of the severe hypotensive episode, with probably some transient colonic ischemia resulting from the hypotension. There is no compelling finding of inflammatory bowel disease on her CT scan, which I have reviewed, nor does she have prodromal symptomatology to suggest underlying IBD".   We kept her in the hospital for monitoring for the following 72hrs as she continued to have diarrhea after transfer to the medical ward. She did have 1 of 2 blood cultures which grew GPC, but this was felt to be a contaminant. As of 8/31 she is tolerating diet, diarrhea improved and she feels close to baseline.   Recommendations from GI were to consider florastor pro-biotic treatment and follow up with him on September 25th. He felt that these symptoms would resolve spontaneously. Final and pending cultures are listed below. She was ready for d/c as of 8/31. She will be sent home off antibiotics per ID recs.. She has been instructed to keep her appointment with GI, has been made a referral for Dr Kendrick Fries re: PAH, and has been instructed to f/u with her PCP in next 7 days.   Discharge Plan by active diagnoses   Infectious diarrhea/ enterocolitis, resulting in septic shock, now resolved.  Discharge plan: Home off abx F/u w/ Dr Matthias Hughs end  of September F/u w/ PCP on 9/4  PAH.  HIV was NR, auto-immune work up negative.  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 Colonic diverticulosis, without evidence of diverticulitis. Possible mucosal hyperenhancement involving the terminal ileum and sigmoid colon, equivocal. Correlate for infectious/inflammatory enterocolitis. No free air. Focal wall thickening involving the anterior bladder dome. Cystoscopic correlation is suggested. 8/26 echo Pa pressure 51, ef wnl, grade 1 diastolic  8/27- off pressors, awake, on low O2 needs  8/27-extubated   LINES / TUBES:  Right IJ Cordis/CVC 8/26>>>8/28  R Femoral A-line >>>8/27  ETT 8/26>>>8/27   CULTURES:  Blood Cultures x 2 8/26>>> 1 of 2 GPC, likely reflecting contamination  Sputum for Cx and GS 8/26>>> neg.  UA 8/26>>> neg Stool entric 8/28>>>  cdiff 8/26: neg   ANTIBIOTICS:  Rocephin 8/26>>> 8/31 Doxycycline x 1 dose 8/26  Flagyl 8/26 >>> 8/31 Vanc 8/26 >>>8/27  Consults: GI and infectious disease.   Discharge Exam: BP 123/55  Pulse 90  Temp(Src) 99.2 F (37.3 C) (Oral)  Resp 18  Ht 5\' 1"  (1.549 m)  Wt 48.626 kg (107 lb 3.2 oz)  BMI 20.27 kg/m2  SpO2 100%  General: Awake, alert, nonfocal  Neuro: Awake, Alert, Following commands.  HEENT: jvd up  Cardiovascular: S1, S2 rrr  Lungs: Clear  Abdomen: Soft and flat, hyperactive bowel sounds.now NON tender  Musculoskeletal: Joint deformities noted in hands, mcp, dip both  Skin: Intact. No rash noted.   Labs at discharge Lab Results  Component Value Date   CREATININE 0.52 06/25/2013   BUN 7 06/25/2013   NA 137 06/25/2013   K 3.8 06/25/2013   CL 102 06/25/2013   CO2 22 06/25/2013   Lab Results  Component Value Date   WBC 17.0* 06/24/2013   HGB 11.6*  06/24/2013   HCT 32.7* 06/24/2013   MCV 86.1 06/24/2013   PLT 233 06/24/2013   Lab Results  Component Value Date   ALT 31 06/23/2013   AST 39* 06/23/2013   ALKPHOS 68 06/23/2013   BILITOT 0.2* 06/23/2013   Lab Results  Component Value Date   INR 1.20 06/21/2013   INR 0.93 12/07/2012    Current radiology studies No results found.  Disposition:  01-Home or Self Care       Future Appointments Provider Department Dept Phone   07/04/2013 11:00 AM Lupita Leash, MD  Pulmonary [facility] [phone]   07/14/2013 3:00 PM Levert Feinstein, MD [facility] [phone]       Medication List    STOP taking these medications       atorvastatin 40 MG tablet  Commonly known as:  LIPITOR     simvastatin 80 MG tablet  Commonly known as:  ZOCOR      TAKE these medications       Alpha-Lipoic Acid 100 MG Caps  Take 1 capsule by mouth daily.     aspirin 325 MG tablet  Take 1 tablet (325 mg total) by mouth daily.     b complex vitamins tablet  Take  2 tablets by mouth daily.     bethanechol 25 MG tablet  Commonly known as:  URECHOLINE  Take 25 mg by mouth 3 (three) times daily.     bethanechol 50 MG tablet  Commonly known as:  URECHOLINE  Take by mouth 2 (two) times daily.     cholecalciferol 1000 UNITS tablet  Commonly known as:  VITAMIN D  Take 1,000 Units by mouth daily.     CoQ-10 100 MG Caps  Take 100 mg by mouth every evening.     cyclobenzaprine 10 MG tablet  Commonly known as:  FLEXERIL  Take 10 mg by mouth 2 (two) times daily as needed for muscle spasms (for muscle spasams).     fluticasone 50 MCG/ACT nasal spray  Commonly known as:  FLONASE  Place 2 sprays into the nose daily.     gabapentin 100 MG capsule  Commonly known as:  NEURONTIN  Take 100-300 mg by mouth 3 (three) times daily. 200 mg every morning and at noon, and 300 mg every evening     HYDROcodone-acetaminophen 5-325 MG per tablet  Commonly known as:  NORCO/VICODIN  Take 1 tablet  by mouth every 6 (six) hours as needed for pain (for pain).     levothyroxine 50 MCG tablet  Commonly known as:  SYNTHROID, LEVOTHROID  Take 50 mcg by mouth daily.     Melatonin 3 MG Caps  Take 3 mg by mouth at bedtime.     multivitamin with minerals Tabs tablet  Take 1 tablet by mouth daily.     nabumetone 500 MG tablet  Commonly known as:  RELAFEN  Take 500 mg by mouth 2 (two) times daily.     omeprazole 40 MG capsule  Commonly known as:  PRILOSEC  Take 40 mg by mouth every evening.     rOPINIRole 1 MG tablet  Commonly known as:  REQUIP  Take 1 mg by mouth 3 (three) times daily as needed.     temazepam 30 MG capsule  Commonly known as:  RESTORIL  Take 30 mg by mouth at bedtime.     traMADol 50 MG tablet  Commonly known as:  ULTRAM  Take 50 mg by mouth 4 (four) times daily as needed.       Follow-up Information   Follow up with DOUGH,ROBERT, MD On 06/30/2013. (1045 am )    Specialty:  Unknown Physician Specialty   Contact information:   [address] [place] [address] [phone]       Follow up with Max Fickle, MD On 07/04/2013. (Your appointment is at our [employer] office NOT [place]: [address] [place]: [phone] at 11am)    Specialty:  Pulmonary Disease   Contact information:   [address] [place] [address] [phone]       Follow up with Florencia Reasons, MD On 07/21/2013.   Specialty:  Gastroenterology   Contact information:   [address]                         [place] [address] [phone]       Discharged Condition: good  Physician Statement:   The Patient was personally examined, the discharge assessment and plan has been personally reviewed and I agree with ACNP Baylynn Shifflett's assessment and plan. > 30 minutes of time have been dedicated to discharge assessment, planning and discharge instructions.   Signed: Mishael Haran,PETE 06/26/2013, 10:09 AM

## 2013-06-25 LAB — CULTURE, BLOOD (ROUTINE X 2)

## 2013-06-25 LAB — BASIC METABOLIC PANEL
BUN: 7 mg/dL (ref 6–23)
Chloride: 102 mEq/L (ref 96–112)
GFR calc Af Amer: >90 mL/min (ref >90)
GFR calc non Af Amer: >90 mL/min (ref >90)
Potassium: 3.8 mEq/L (ref 3.5–5.1)
Sodium: 137 mEq/L (ref 135–145)

## 2013-06-25 NOTE — Progress Notes (Signed)
INFECTIOUS DISEASE PROGRESS NOTE  ID: [name] is a 67 y.o. female with  Active Problems:   Septic shock(785.52)   Acute respiratory failure with hypoxia   Bradycardia  Subjective: Still having dark BM- x2 this AM.  Abtx:  Anti-infectives   Start     Dose/Rate Route Frequency Ordered Stop   06/24/13 1400  metroNIDAZOLE (FLAGYL) tablet 500 mg     500 mg Oral 3 times per day 06/24/13 1336     06/22/13 1400  vancomycin (VANCOCIN) IVPB 750 mg/150 ml premix  Status:  Discontinued     750 mg 150 mL/hr over 60 Minutes Intravenous Every 24 hours 06/21/13 1422 06/22/13 0839   06/22/13 1400  cefTRIAXone (ROCEPHIN) 1 g in dextrose 5 % 50 mL IVPB     1 g 100 mL/hr over 30 Minutes Intravenous Every 24 hours 06/22/13 0839     06/22/13 1000  vancomycin (VANCOCIN) 500 mg in sodium chloride 0.9 % 100 mL IVPB  Status:  Discontinued     500 mg 100 mL/hr over 60 Minutes Intravenous Every 12 hours 06/22/13 0839 06/22/13 1313   06/21/13 2000  metroNIDAZOLE (FLAGYL) IVPB 500 mg  Status:  Discontinued     500 mg 100 mL/hr over 60 Minutes Intravenous Every 6 hours 06/21/13 1843 06/24/13 1336   06/21/13 1400  doxycycline (VIBRAMYCIN) 100 mg in dextrose 5 % 250 mL IVPB     100 mg 125 mL/hr over 120 Minutes Intravenous  Once 06/21/13 1332 06/21/13 1815   06/21/13 1400  vancomycin (VANCOCIN) IVPB 1000 mg/200 mL premix     1,000 mg 200 mL/hr over 60 Minutes Intravenous STAT 06/21/13 1357 06/21/13 1918   06/21/13 1400  metroNIDAZOLE (FLAGYL) IVPB 500 mg  Status:  Discontinued     500 mg 100 mL/hr over 60 Minutes Intravenous 4 times per day 06/21/13 1358 06/21/13 1843   06/21/13 1345  cefTRIAXone (ROCEPHIN) 2 g in dextrose 5 % 50 mL IVPB     2 g 100 mL/hr over 30 Minutes Intravenous  Once 06/21/13 1331 06/21/13 1425      Medications:  Scheduled: . antiseptic oral rinse  15 mL Mouth Rinse BID  . cefTRIAXone (ROCEPHIN)  IV  1 g Intravenous Q24H  . famotidine  20 mg Oral QHS  . metroNIDAZOLE  500  mg Oral Q8H  . temazepam  15 mg Oral QHS    Objective: Vital signs in last 24 hours: Temp:  [98.6 F (37 C)-99 F (37.2 C)] 99 F (37.2 C) (08/30 0526) Pulse Rate:  [86-99] 99 (08/30 0526) Resp:  [16-20] 20 (08/30 0526) BP: (106-133)/(71-82) 106/82 mmHg (08/30 0526) SpO2:  [98 %-100 %] 99 % (08/30 0526) Weight:  [49.1 kg (108 lb 3.9 oz)] 49.1 kg (108 lb 3.9 oz) (08/30 0526)   General appearance: alert, cooperative and no distress GI: normal findings: bowel sounds normal and soft, non-tender  Lab Results  Recent Labs  06/23/13 0450 06/24/13 0555 06/25/13 0453  WBC 15.0* 17.0*  --   HGB 11.0* 11.6*  --   HCT 30.8* 32.7*  --   NA 137 138 137  K 3.1* 2.7* 3.8  CL 103 100 102  CO2 22 23 22   BUN 4* 5* 7  CREATININE 0.41* 0.41* 0.52   Liver Panel  Recent Labs  06/23/13 0450  PROT 5.7*  ALBUMIN 2.9*  AST 39*  ALT 31  ALKPHOS 68  BILITOT 0.2*   Sedimentation Rate No results found for this basename:  ESRSEDRATE,  in the last 72 hours C-Reactive Protein No results found for this basename: CRP,  in the last 72 hours  Microbiology: Recent Results (from the past 240 hour(s))  CULTURE, BLOOD (ROUTINE X 2)     Status: None   Collection Time    06/21/13  1:10 PM      Result Value Range Status   Specimen Description BLOOD LEFT FOREARM   Final   Special Requests BOTTLES DRAWN AEROBIC ONLY   Final   Culture  Setup Time     Final   Value: 06/21/2013 17:01     Performed at [facility]   Culture     Final   Value: MICROCOCCUS SPECIES     Note: Standardized susceptibility testing for this organism is not available.     Note: Gram Stain Report Called to,Read Back By and Verified With: Allyson Sabal [id number] BRMEL     Performed at [facility]   Report Status 06/25/2013 FINAL   Final  CULTURE, BLOOD (ROUTINE X 2)     Status: None   Collection Time    06/21/13  1:15 PM      Result Value Range Status   Specimen Description BLOOD LEFT HAND    Final   Special Requests BOTTLES DRAWN AEROBIC AND ANAEROBIC   Final   Culture  Setup Time     Final   Value: 06/21/2013 17:01     Performed at [facility]   Culture     Final   Value:        BLOOD CULTURE RECEIVED NO GROWTH TO DATE CULTURE WILL BE HELD FOR 5 DAYS BEFORE ISSUING A FINAL NEGATIVE REPORT     Performed at [facility]   Report Status PENDING   Incomplete  MRSA PCR SCREENING     Status: None   Collection Time    06/21/13  5:44 PM      Result Value Range Status   MRSA by PCR NEGATIVE  NEGATIVE Final   Comment:            The GeneXpert MRSA Assay (FDA     approved for NASAL specimens     only), is one component of a     comprehensive MRSA colonization     surveillance program. It is not     intended to diagnose MRSA     infection nor to guide or     monitor treatment for     MRSA infections.  URINE CULTURE     Status: None   Collection Time    06/21/13  6:39 PM      Result Value Range Status   Specimen Description URINE, CATHETERIZED   Final   Special Requests NONE   Final   Culture  Setup Time     Final   Value: 06/21/2013 19:05     Performed at [facility] Count     Final   Value: NO GROWTH     Performed at [facility]   Culture     Final   Value: NO GROWTH     Performed at [facility]   Report Status 06/22/2013 FINAL   Final  CULTURE, RESPIRATORY (NON-EXPECTORATED)     Status: None   Collection Time    06/22/13  7:49 AM      Result Value Range Status   Specimen Description TRACHEAL ASPIRATE   Final   Special  Requests Normal   Final   Gram Stain     Final   Value: RARE WBC PRESENT, PREDOMINANTLY PMN     NO ORGANISMS SEEN     Performed at [facility]   Culture     Final   Value: NO GROWTH 2 DAYS     Performed at [facility]   Report Status 06/24/2013 FINAL   Final  CLOSTRIDIUM DIFFICILE BY PCR     Status: None   Collection Time    06/24/13  5:20 PM      Result Value Range  Status   C difficile by pcr NEGATIVE  NEGATIVE Final    Studies/Results: No results found.   Assessment/Plan: Diarrhea   ? enterocolitis   Total days of antibiotics: 5 (ceftriaxone/flagyl)  If she can eat, would consider d/c home off anbx.  She has GI f/u scheduled? Available if questions.         Johny Sax Infectious Diseases (pager) [phone] [email or web address] 06/25/2013, 11:55 AM  LOS: 4 days

## 2013-06-25 NOTE — Progress Notes (Signed)
PULMONARY  / CRITICAL CARE MEDICINE  Name: KANIESHA BARILE MRN: [id number] DOB: [birth year]    ADMISSION DATE:  06/21/2013   REFERRING MD :  EDP PRIMARY SERVICE: CCM  CHIEF COMPLAINT:Sepsis   BRIEF PATIENT DESCRIPTION: 67 y/o F who presented to [facility] ER on 8/26 with profound bradycardia (30's), hypothermia (93), hypotension & bloody diarrhea.    SIGNIFICANT EVENTS / STUDIES:  8/26 - admit with shock, bloody diarrhea, profound bradycardia (30's), hypothermia (93), hypotension 8/26 echo Pa pressure 51, ef wnl, grade 1 diastolic 8/27- off pressors, awake, on low O2 needs 8/27-extubated  LINES / TUBES: Right IJ Cordis/CVC 8/26>>>8/28 (planned) R Femoral A-line >>>8/27 ETT 8/26>>>8/27  CULTURES: Blood Cultures x 2 8/26>>>1/2 GPC>>> Sputum for Cx and GS 8/26>>>neg UA 8/26>>>neg  Stool entric 8/28>>> Cdiff: neg  ANTIBIOTICS: Rocephin 8/26>>> Doxycycline x 1 dose 8/26 Flagyl 8/26 >>> Vanc 8/26 >>>8/27  SUBJECTIVE:  Husband here. She says "I'd be fine if I could eat my own cooking". Passing gas. Denies abd pain.  VITAL SIGNS: Temp:  [98.6 F (37 C)-99 F (37.2 C)] 99 F (37.2 C) (08/30 0526) Pulse Rate:  [86-99] 99 (08/30 0526) Resp:  [16-20] 20 (08/30 0526) BP: (106-133)/(71-82) 106/82 mmHg (08/30 0526) SpO2:  [98 %-100 %] 99 % (08/30 0526) Weight:  [49.1 kg (108 lb 3.9 oz)] 49.1 kg (108 lb 3.9 oz) (08/30 0526)  HEMODYNAMICS:    VENTILATOR SETTINGS:    INTAKE / OUTPUT: Intake/Output     08/29 0701 - 08/30 0700 08/30 0701 - 08/31 0700   P.O. 480 140   IV Piggyback     Total Intake(mL/kg) 480 (9.8) 140 (2.9)   Urine (mL/kg/hr) 1002 (0.9)    Stool     Total Output 1002     Net -522 +140        Stool Occurrence 6 x      PHYSICAL EXAMINATION: General:  Awake, alert, nonfocal. Up ambulatory in room Neuro: Awake, Alert, Following commands. HEENT: [place] no JVD Cardiovascular: S1, S2 rrr Lungs: Clear Abdomen: Soft and flat,  Still hyperactive bowel sounds. NON  tender Musculoskeletal:  Joint deformities noted in hands, mcp, dip both Skin: Intact. No rash noted. Multiple tatoos  LABS:  CBC Recent Labs     06/23/13  0450  06/24/13  0555  WBC  15.0*  17.0*  HGB  11.0*  11.6*  HCT  30.8*  32.7*  PLT  196  233   Coag's No results found for this basename: APTT, INR,  in the last 72 hours BMET Recent Labs     06/23/13  0450  06/24/13  0555  06/25/13  0453  NA  137  138  137  K  3.1*  2.7*  3.8  CL  103  100  102  CO2  22  23  22   BUN  4*  5*  7  CREATININE  0.41*  0.41*  0.52  GLUCOSE  134*  115*  102*   Electrolytes Recent Labs     06/23/13  0450  06/24/13  0555  06/25/13  0453  CALCIUM  7.9*  8.5  8.9   Sepsis Markers No results found for this basename: LACTICACIDVEN, PROCALCITON, O2SATVEN,  in the last 72 hours ABG No results found for this basename: PHART, PCO2ART, PO2ART,  in the last 72 hours Liver Enzymes Recent Labs     06/23/13  0450  AST  39*  ALT  31  ALKPHOS  68  BILITOT  0.2*  ALBUMIN  2.9*   Cardiac Enzymes Recent Labs     06/22/13  1215  TROPONINI  <0.30   Glucose Recent Labs     06/22/13  1141  06/22/13  1537  GLUCAP  119*  221*    Imaging No results found.   pcxr-line nwl, no infiltrates  ASSESSMENT / PLAN:  Infectious diarrhea/ enterocolitis.  > r/o E coli, o157, r./;o enterics, shig, salm etc, campylobacter, still pending. C.diff neg > associated inflammatory bowel disease seems less likely as work-up to date negative and GI also thinks not likely.  > unlikley Lyme > She is getting restless. Best approach will be to see if she tolerates advancing diet based on her own low-fat preferences, as tolerated. P:   Cont current abx F/u out-pt GI  Add floraq Adv diet slow - husband can bring outside food. F/u labs.   Hypokalemia severe  3.8 as of 8/30 P: Replace/recheck  Sick euthyroidism  Rel AI P:   -f/u thyroid pnl out-pt Weaned off steroids.   Chronic Pain - back P:    Resume home rx     CD Young, MD PCCM After 5:30 PM or weekends, call [phone]

## 2013-06-26 LAB — STOOL CULTURE: Special Requests: NORMAL

## 2013-06-26 NOTE — Progress Notes (Signed)
PULMONARY  / CRITICAL CARE MEDICINE  Name: [name] MRN: [id number] DOB: 1946/05/03    ADMISSION DATE:  06/21/2013   REFERRING MD :  EDP PRIMARY SERVICE: CCM  CHIEF COMPLAINT:Sepsis   BRIEF PATIENT DESCRIPTION: 67 y/o F who presented to [facility] ER on 8/26 with profound bradycardia (30's), hypothermia (93), hypotension & bloody diarrhea.    SIGNIFICANT EVENTS / STUDIES:  8/26 - admit with shock, bloody diarrhea, profound bradycardia (30's), hypothermia (93), hypotension 8/26 echo Pa pressure 51, ef wnl, grade 1 diastolic 8/27- off pressors, awake, on low O2 needs 8/27-extubated  LINES / TUBES: Right IJ Cordis/CVC 8/26>>>8/28 (planned) R Femoral A-line >>>8/27 ETT 8/26>>>8/27  CULTURES: Blood Cultures x 2 8/26>>>1/2 GPC>>> Sputum for Cx and GS 8/26>>>neg UA 8/26>>>neg  Stool entric 8/28>>> Cdiff: neg  ANTIBIOTICS: Rocephin 8/26>>> Doxycycline x 1 dose 8/26 Flagyl 8/26 >>> Vanc 8/26 >>>8/27  SUBJECTIVE:  Husband here. Seen today for discharge planning. Feels well after eating well last night- husband brought "skinless chicken"  VITAL SIGNS: Temp:  [98.6 F (37 C)-99.2 F (37.3 C)] 99.2 F (37.3 C) (08/31 0529) Pulse Rate:  [90-105] 90 (08/31 0529) Resp:  [18-20] 18 (08/31 0529) BP: (123-135)/(55-82) 123/55 mmHg (08/31 0529) SpO2:  [99 %-100 %] 100 % (08/31 0529) Weight:  [48.626 kg (107 lb 3.2 oz)] 48.626 kg (107 lb 3.2 oz) (08/31 0529)  HEMODYNAMICS:    VENTILATOR SETTINGS:    INTAKE / OUTPUT: Intake/Output     08/30 0701 - 08/31 0700 08/31 0701 - 09/01 0700   P.O. 800    Total Intake(mL/kg) 800 (16.5)    Urine (mL/kg/hr) 1775 (1.5)    Total Output 1775     Net -975            PHYSICAL EXAMINATION: General:  Awake, alert, nonfocal. Up ambulatory in room Neuro: Awake, Alert, Following commands. HEENT: [place] no JVD Cardiovascular: S1, S2 rrr Lungs: Clear Abdomen: Soft and flat,  Normal bowel sounds. NON tender Musculoskeletal:  Joint deformities  noted in hands, mcp, dip both Skin: Intact. No rash noted. Multiple tatoos  LABS:  CBC Recent Labs     06/24/13  0555  WBC  17.0*  HGB  11.6*  HCT  32.7*  PLT  233   Coag's No results found for this basename: APTT, INR,  in the last 72 hours BMET Recent Labs     06/24/13  0555  06/25/13  0453  NA  138  137  K  2.7*  3.8  CL  100  102  CO2  23  22  BUN  5*  7  CREATININE  0.41*  0.52  GLUCOSE  115*  102*   Electrolytes Recent Labs     06/24/13  0555  06/25/13  0453  CALCIUM  8.5  8.9   Sepsis Markers No results found for this basename: LACTICACIDVEN, PROCALCITON, O2SATVEN,  in the last 72 hours ABG No results found for this basename: PHART, PCO2ART, PO2ART,  in the last 72 hours Liver Enzymes No results found for this basename: AST, ALT, ALKPHOS, BILITOT, ALBUMIN,  in the last 72 hours Cardiac Enzymes No results found for this basename: TROPONINI, PROBNP,  in the last 72 hours Glucose No results found for this basename: GLUCAP,  in the last 72 hours  Imaging No results found.   pcxr-line nwl, no infiltrates  ASSESSMENT / PLAN:  Infectious diarrhea/ enterocolitis.  > r/o E coli, o157, r./;o enterics, shig, salm etc, campylobacter, still pending. C.diff  neg > associated inflammatory bowel disease seems less likely as work-up to date negative and GI also thinks not likely.  > unlikley Lyme > She tolerated food her husband brought last night. No apparent problems. Discussed probiotics. P:   D/C antibiotics and discharge home on regular diet F/u out-pt GI -has appt   Hypokalemia severe  3.8 as of 8/30 P: Replace/recheck  Sick euthyroidism  Rel AI P:   -f/u thyroid pnl out-pt Weaned off steroids.   Chronic Pain - back P:   Resume home rx     CD Young, MD PCCM After 5:30 PM or weekends, call [phone]

## 2013-06-27 LAB — CULTURE, BLOOD (ROUTINE X 2): Culture: NO GROWTH

## 2013-06-28 LAB — GIARDIA/CRYPTOSPORIDIUM SCREEN(EIA)
Cryptosporidium Screen (EIA): NEGATIVE
Giardia Screen - EIA: NEGATIVE

## 2013-07-04 ENCOUNTER — Ambulatory Visit (INDEPENDENT_AMBULATORY_CARE_PROVIDER_SITE_OTHER): Payer: Medicare Other | Admitting: Pulmonary Disease

## 2013-07-04 ENCOUNTER — Encounter: Payer: Self-pay | Admitting: Pulmonary Disease

## 2013-07-04 VITALS — BP 98/62 | HR 110 | Ht 61.0 in | Wt 104.0 lb

## 2013-07-04 DIAGNOSIS — I2789 Other specified pulmonary heart diseases: Secondary | ICD-10-CM

## 2013-07-04 DIAGNOSIS — I272 Pulmonary hypertension, unspecified: Secondary | ICD-10-CM | POA: Insufficient documentation

## 2013-07-04 DIAGNOSIS — J438 Other emphysema: Secondary | ICD-10-CM | POA: Insufficient documentation

## 2013-07-04 NOTE — Assessment & Plan Note (Signed)
Because we saw emphysema on the 05/2013 CT chest and she smoked 1 pack of cigarettes daily for 30 years I am concerned about emphysema.  This can certainly contribute to pulmonary hypertension.  Plan: -Full PFT's

## 2013-07-04 NOTE — Patient Instructions (Signed)
We will set up full pulmonary function testing and a 6 minute walk for you in the next few weeks  We will see you back after you have had your visit with the rheumatologist (plan 6 weeks from now).  We will have you get an echocardiogram before that visit.

## 2013-07-04 NOTE — Assessment & Plan Note (Signed)
I explained to [name] that I am encouraged by the fact that she has a normal functional status (was walking 4-5 miles at a time prior to her hospitalization) and the fact that her 11/2012 Echo was normal.  I suspect that the elevated PA pressure on the 06/21/2013 echo was due to the fact that she has emphysema and was on the vent at the time.  However, given her possible connective tissue disease I don't want to blow off the possibility of WHO Class 1 pulmonary hypertension.    She needs a definitive work up for her arthritis symptoms so I will send her for evaluation by a rheumatologist.  Plan: -rheumatology evaluation for arthritis -PFT and 6 MW in the next few weeks -Repeat Echocardiogram> if still evidence of pulmonary hypertension then will work up -f/u two months

## 2013-07-04 NOTE — Progress Notes (Signed)
Subjective:    Patient ID: [name], female    DOB: [birth year], 67 y.o.   MRN: [id number]  HPI  This is a very pleasant 67 year old female who is referred to me today for evaluation of possible pulmonary arterial hypertension. She's never had breathing trouble in the past unfortunately she smoked one pack of cigarettes daily from age 49 until about age 7. She has not smoked since then. After quitting smoking she remains very active and walks anywhere between 8-11 miles at a time up until approximately 2 years ago. She had cut back on her walking distance because of cramping which is associated with statin use. In the last year she has been walking 4-5 miles at a time. In fact just several months ago she was doing this without any trouble breathing or respiratory difficulty. She tells me that she never has had any respiratory problems in the past. However in August of this year she was admitted to [facility] for septic shock in association with C. Difficile. During that time she has CT scan of her abdomen which was suggestive of a possible inflammatory bowel disease and possible ischemic colitis. She also had an echocardiogram which was performed while she was on a ventilator in the setting of acute respiratory failure which showed an elevated RVSP. For that reason she is centimeters for evaluation of possible pulmonary arterial hypertension. Notably, during that hospitalization she had a CT chest which showed centrilobular emphysema. Since hospital discharge she says that she has been gradually getting her strength back. She is starting to exercise again regularly. She has not felt any shortness of breath.  She has had a diagnosis of possible rheumatoid arthritis since age 1. She has never been seen by a rheumatoid arthritis specialist and has never been given an official diagnosis.  She has not had trouble swallowing, dry eyes, or dry mouth.  No unexplained weight loss, rash, or fevers.     Past Medical History  Diagnosis Date  . Hip pain   . Back pain   . GERD (gastroesophageal reflux disease)   . Hyperthyroidism   . Arthritis   . Restless leg syndrome   . Ex-smoker Quit 15 years ago    Smoked 1 PPD x 20 years     Family History  Problem Relation Age of Onset  . Adopted: Yes     History   Social History  . Marital Status: Married    Spouse Name: N/A    Number of Children: N/A  . Years of Education: N/A   Occupational History  . Not on file.   Social History Main Topics  . Smoking status: Former Smoker    Types: Cigarettes  . Smokeless tobacco: Never Used     Comment: Quit about 20 years before.  . Alcohol Use: No  . Drug Use: No  . Sexual Activity: No   Other Topics Concern  . Not on file   Social History Narrative   Patient lives with her husband.    She is an adopted kid and so is unaware of her family history.   She has 2 children- one has asthma and other has arthritis.           No Known Allergies   Outpatient Prescriptions Prior to Visit  Medication Sig Dispense Refill  . Alpha-Lipoic Acid 100 MG CAPS Take 1 capsule by mouth daily.      Marland Kitchen aspirin 325 MG tablet Take 1 tablet (325 mg total)  by mouth daily.      Marland Kitchen b complex vitamins tablet Take 2 tablets by mouth daily.      . bethanechol (URECHOLINE) 25 MG tablet Take 25 mg by mouth 3 (three) times daily.      . bethanechol (URECHOLINE) 50 MG tablet Take by mouth 2 (two) times daily.      . cholecalciferol (VITAMIN D) 1000 UNITS tablet Take 1,000 Units by mouth daily.      . Coenzyme Q10 (COQ-10) 100 MG CAPS Take 100 mg by mouth every evening.      . cyclobenzaprine (FLEXERIL) 10 MG tablet Take 10 mg by mouth 2 (two) times daily as needed for muscle spasms (for muscle spasams).      . fluticasone (FLONASE) 50 MCG/ACT nasal spray Place 2 sprays into the nose daily.      Marland Kitchen gabapentin (NEURONTIN) 100 MG capsule Take 100-300 mg by mouth 3 (three) times daily. 200 mg every morning and at  noon, and 300 mg every evening      . HYDROcodone-acetaminophen (NORCO/VICODIN) 5-325 MG per tablet Take 1 tablet by mouth every 6 (six) hours as needed for pain (for pain).      Marland Kitchen levothyroxine (SYNTHROID, LEVOTHROID) 50 MCG tablet Take 50 mcg by mouth daily.      . Melatonin 3 MG CAPS Take 3 mg by mouth at bedtime.      . Multiple Vitamin (MULTIVITAMIN WITH MINERALS) TABS Take 1 tablet by mouth daily.      Marland Kitchen omeprazole (PRILOSEC) 40 MG capsule Take 40 mg by mouth every evening.      Marland Kitchen rOPINIRole (REQUIP) 1 MG tablet Take 1 mg by mouth 3 (three) times daily as needed.       . temazepam (RESTORIL) 30 MG capsule Take 30 mg by mouth at bedtime.      . traMADol (ULTRAM) 50 MG tablet Take 50 mg by mouth 4 (four) times daily as needed.       . nabumetone (RELAFEN) 500 MG tablet Take 500 mg by mouth 2 (two) times daily.       No facility-administered medications prior to visit.      Review of Systems  Constitutional: Negative for fever, chills, diaphoresis, activity change, appetite change, fatigue and unexpected weight change.  HENT: Positive for congestion. Negative for hearing loss, ear pain, nosebleeds, sore throat, facial swelling, rhinorrhea, sneezing, mouth sores, trouble swallowing, neck pain, neck stiffness, dental problem, voice change, postnasal drip, sinus pressure, tinnitus and ear discharge.   Eyes: Negative for photophobia, discharge, itching and visual disturbance.  Respiratory: Negative for apnea, cough, choking, chest tightness, shortness of breath, wheezing and stridor.   Cardiovascular: Negative for chest pain, palpitations and leg swelling.  Gastrointestinal: Positive for abdominal pain. Negative for nausea, vomiting, constipation, blood in stool and abdominal distention.  Genitourinary: Negative for dysuria, urgency, frequency, hematuria, flank pain, decreased urine volume and difficulty urinating.  Musculoskeletal: Negative for myalgias, back pain, joint swelling, arthralgias  and gait problem.  Skin: Negative for color change, pallor and rash.  Neurological: Negative for dizziness, tremors, seizures, syncope, speech difficulty, weakness, light-headedness, numbness and headaches.  Hematological: Negative for adenopathy. Does not bruise/bleed easily.  Psychiatric/Behavioral: Negative for confusion, sleep disturbance and agitation. The patient is not nervous/anxious.        Objective:   Physical Exam  Filed Vitals:   07/04/13 1102  BP: 98/62  Pulse: 110  Height: 5\' 1"  (1.549 m)  Weight: 104 lb (47.174 kg)  SpO2:  100%   Gen: well appearing, no acute distress HEENT: NCAT, PERRL, EOMi, OP clear, neck supple without masses PULM: CTA B CV: RRR, no mgr, no JVD AB: BS+, soft, nontender, no hsm Ext: warm, no edema, no clubbing, no cyanosis Derm: no rash or skin breakdown MSK: hands: multiple joint deformities with deviation, palpable synovitis DIP/PIP Neuro: A&Ox4, CN II-XII intact, strength 5/5 in all 4 extremities       Assessment & Plan:   Pulmonary hypertension I explained to [place] that I am encouraged by the fact that she has a normal functional status (was walking 4-5 miles at a time prior to her hospitalization) and the fact that her 11/2012 Echo was normal.  I suspect that the elevated PA pressure on the 06/21/2013 echo was due to the fact that she has emphysema and was on the vent at the time.  However, given her possible connective tissue disease I don't want to blow off the possibility of WHO Class 1 pulmonary hypertension.    She needs a definitive work up for her arthritis symptoms so I will send her for evaluation by a rheumatologist.  Plan: -rheumatology evaluation for arthritis -PFT and 6 MW in the next few weeks -Repeat Echocardiogram> if still evidence of pulmonary hypertension then will work up -f/u two months  Other emphysema Because we saw emphysema on the 05/2013 CT chest and she smoked 1 pack of cigarettes daily for 30 years I am  concerned about emphysema.  This can certainly contribute to pulmonary hypertension.  Plan: -Full PFT's   Updated Medication List Outpatient Encounter Prescriptions as of 07/04/2013  Medication Sig Dispense Refill  . Alpha-Lipoic Acid 100 MG CAPS Take 1 capsule by mouth daily.      Marland Kitchen aspirin 325 MG tablet Take 1 tablet (325 mg total) by mouth daily.      Marland Kitchen b complex vitamins tablet Take 2 tablets by mouth daily.      . bethanechol (URECHOLINE) 25 MG tablet Take 25 mg by mouth 3 (three) times daily.      . bethanechol (URECHOLINE) 50 MG tablet Take by mouth 2 (two) times daily.      . cholecalciferol (VITAMIN D) 1000 UNITS tablet Take 1,000 Units by mouth daily.      . Coenzyme Q10 (COQ-10) 100 MG CAPS Take 100 mg by mouth every evening.      . cyclobenzaprine (FLEXERIL) 10 MG tablet Take 10 mg by mouth 2 (two) times daily as needed for muscle spasms (for muscle spasams).      . fluticasone (FLONASE) 50 MCG/ACT nasal spray Place 2 sprays into the nose daily.      Marland Kitchen gabapentin (NEURONTIN) 100 MG capsule Take 100-300 mg by mouth 3 (three) times daily. 200 mg every morning and at noon, and 300 mg every evening      . HYDROcodone-acetaminophen (NORCO/VICODIN) 5-325 MG per tablet Take 1 tablet by mouth every 6 (six) hours as needed for pain (for pain).      Marland Kitchen levothyroxine (SYNTHROID, LEVOTHROID) 50 MCG tablet Take 50 mcg by mouth daily.      . Melatonin 3 MG CAPS Take 3 mg by mouth at bedtime.      . Multiple Vitamin (MULTIVITAMIN WITH MINERALS) TABS Take 1 tablet by mouth daily.      Marland Kitchen omeprazole (PRILOSEC) 40 MG capsule Take 40 mg by mouth every evening.      Marland Kitchen rOPINIRole (REQUIP) 1 MG tablet Take 1 mg by mouth 3 (three) times  daily as needed.       . temazepam (RESTORIL) 30 MG capsule Take 30 mg by mouth at bedtime.      . traMADol (ULTRAM) 50 MG tablet Take 50 mg by mouth 4 (four) times daily as needed.       . [DISCONTINUED] nabumetone (RELAFEN) 500 MG tablet Take 500 mg by mouth 2 (two)  times daily.       No facility-administered encounter medications on file as of 07/04/2013.

## 2013-07-14 ENCOUNTER — Encounter: Payer: Self-pay | Admitting: Neurology

## 2013-07-14 ENCOUNTER — Ambulatory Visit (INDEPENDENT_AMBULATORY_CARE_PROVIDER_SITE_OTHER): Payer: Medicare Other | Admitting: Neurology

## 2013-07-14 VITALS — BP 99/60 | HR 102 | Ht 60.0 in | Wt 106.0 lb

## 2013-07-14 DIAGNOSIS — E039 Hypothyroidism, unspecified: Secondary | ICD-10-CM

## 2013-07-14 DIAGNOSIS — G2581 Restless legs syndrome: Secondary | ICD-10-CM

## 2013-07-14 NOTE — Progress Notes (Signed)
. No acute intracranial abnormality.  2. Mild for age nonspecific cerebral white matter signal changes.  3. Intracranial MRA findings are below.    MRA of brain was normal.  CT of chest, abdomen, no evidence of abdominal aortic aneurysm or dissection.  Colonic diverticulosis, without evidence of diverticulitis.  Possible mucosal hyperenhancement involving the terminal ileum and  sigmoid colon, equivocal. Correlate for infectious/inflammatory  enterocolitis. No free air.    [facility]  PATIENT: [name] DOB: 10-29-1945  HISTORICAL  [name] is a 67 years old right-handed Caucasian female, accompanied by her husband, referred by her primary care physician Dr. Dina Rich evaluation of recurrent episodes  She had past medical history of hyperlipidemia, denies a previous history of migraine, she drives commercial driving for [employer]  She had a few different recurrent episode since February 2014,  The first episode was December 07 2012, she was sitting at the table in the morning time, without warning signs, she slumped over, difficulty talking, confused, her husband called 911, she was taken to the most is: Hospital, MRI of the brain showed mild atrophy, small vessel disease, no acute lesions, MRA of the brain showed no large vessel disease. Ultrasound of carotid artery showed no significant internal carotid artery stenosis, anterior grade flow at posterior circulation. Echocardiogram showed ejection fraction 55-60%, increased the right ventricular pressure, pulmonary arterial pressure was 51 mm,  She had sudden onset of double vision while driving he already August 2014, it was horizontal double vision, she did not lost control of her vehicle, lasting 10-15 minutes, disappeared gradually, she did not have a headache,  The third episode was in the mid August, she was watching TV with her husband, she suddenly began to see flashlight at her right visual  field, lasting for 10 minutes, no headache, no loss of consciousness,  She also presented to the emergency room in June 24 2013, with abdominal pain, diarrhea, laboratory showed normal BMP, elevated WBC of 17,  She is now back to her baseline, no gait difficulty, denied previous history of seizure, no headaches,  REVIEW OF SYSTEMS: Full 14 system review of systems performed and notable only for fever, chills, double vision, easy bruising, easy bleeding, feeling hot, feeling cold, joint pain, cramps, allergy, frequent infection, memory loss, confusion, dizziness, passing out, restless leg  ALLERGIES: No Known Allergies  HOME MEDICATIONS: Outpatient Prescriptions Prior to Visit  Medication Sig Dispense Refill  . Alpha-Lipoic Acid 100 MG CAPS Take 1 capsule by mouth daily.      Marland Kitchen b complex vitamins tablet Take 2 tablets by mouth daily.      . bethanechol (URECHOLINE) 25 MG tablet Take 25 mg by mouth 3 (three) times daily.      . bethanechol (URECHOLINE) 50 MG tablet Take by mouth 2 (two) times daily.      . cholecalciferol (VITAMIN D) 1000 UNITS tablet Take 2,000 Units by mouth daily.       . Coenzyme Q10 (COQ-10) 100 MG CAPS Take 200 mg by mouth every evening.       . cyclobenzaprine (FLEXERIL) 10 MG tablet Take 10 mg by mouth 2 (two) times daily as needed for muscle spasms (for muscle spasams).      . fluticasone (FLONASE) 50 MCG/ACT nasal spray Place 2 sprays into the nose daily.      Marland Kitchen gabapentin (NEURONTIN) 100 MG capsule Take 100-300 mg by mouth 3 (three) times daily. 200 mg every morning and at noon, and 300  mg every evening      . HYDROcodone-acetaminophen (NORCO/VICODIN) 5-325 MG per tablet Take 1 tablet by mouth every 6 (six) hours as needed for pain (for pain).      Marland Kitchen levothyroxine (SYNTHROID, LEVOTHROID) 50 MCG tablet Take 50 mcg by mouth daily.      . Melatonin 3 MG CAPS Take 3 mg by mouth at bedtime.      . Multiple Vitamin (MULTIVITAMIN WITH MINERALS) TABS Take 1 tablet by  mouth daily.      Marland Kitchen omeprazole (PRILOSEC) 40 MG capsule Take 40 mg by mouth every evening.      Marland Kitchen rOPINIRole (REQUIP) 1 MG tablet Take 1 mg by mouth 3 (three) times daily as needed.       . temazepam (RESTORIL) 30 MG capsule Take 30 mg by mouth at bedtime.      Marland Kitchen aspirin 325 MG tablet Take 1 tablet (325 mg total) by mouth daily.      . traMADol (ULTRAM) 50 MG tablet Take 50 mg by mouth 4 (four) times daily as needed.        No facility-administered medications prior to visit.    PAST MEDICAL HISTORY: Past Medical History  Diagnosis Date  . Hip pain   . Back pain   . GERD (gastroesophageal reflux disease)   . Hyperthyroidism   . Arthritis   . Restless leg syndrome   . Ex-smoker Quit 15 years ago    Smoked 1 PPD x 20 years    PAST SURGICAL HISTORY: Past Surgical History  Procedure Laterality Date  . Right rotator cuff repair    . Esophageal dilation      X 2  . Bladder suspension      FAMILY HISTORY: Family History  Problem Relation Age of Onset  . Adopted: Yes    SOCIAL HISTORY:    PHYSICAL EXAM   Filed Vitals:   07/14/13 1511  BP: 99/60  Pulse: 102  Height: 5' (1.524 m)  Weight: 106 lb (48.081 kg)     Body mass index is 20.7 kg/(m^2).   Generalized: In no acute distress  Neck: Supple, no carotid bruits   Cardiac: Regular rate rhythm  Pulmonary: Clear to auscultation bilaterally  Musculoskeletal: No deformity  Neurological examination  Mentation: Alert oriented to time, place, history taking, and causual conversation  Cranial nerve II-XII: Pupils were equal round reactive to light extraocular movements were full, visual field were full on confrontational test. facial sensation and strength were normal. hearing was intact to finger rubbing bilaterally. Uvula tongue midline.  head turning and shoulder shrug and were normal and symmetric.Tongue protrusion into cheek strength was normal.  Motor: normal tone, bulk and strength.  Sensory: Intact to  fine touch, pinprick, preserved vibratory sensation, and proprioception at toes.  Coordination: Normal finger to nose, heel-to-shin bilaterally there was no truncal ataxia  Gait: Rising up from seated position without assistance, normal stance, without trunk ataxia, moderate stride, good arm swing, smooth turning, able to perform tiptoe, and heel walking without difficulty.   Romberg signs: Negative  Deep tendon reflexes: Brachioradialis 2/2, biceps 2/2, triceps 2/2, patellar 2/2, Achilles 2/2, plantar responses were flexor bilaterally.   DIAGNOSTIC DATA (LABS, IMAGING, TESTING) - I reviewed patient records, labs, notes, testing and imaging myself where available.  Lab Results  Component Value Date   WBC 17.0* 06/24/2013   HGB 11.6* 06/24/2013   HCT 32.7* 06/24/2013   MCV 86.1 06/24/2013   PLT 233 06/24/2013  Component Value Date/Time   NA 137 06/25/2013 0453   K 3.8 06/25/2013 0453   CL 102 06/25/2013 0453   CO2 22 06/25/2013 0453   GLUCOSE 102* 06/25/2013 0453   BUN 7 06/25/2013 0453   CREATININE 0.52 06/25/2013 0453   CALCIUM 8.9 06/25/2013 0453   PROT 5.7* 06/23/2013 0450   ALBUMIN 2.9* 06/23/2013 0450   AST 39* 06/23/2013 0450   ALT 31 06/23/2013 0450   ALKPHOS 68 06/23/2013 0450   BILITOT 0.2* 06/23/2013 0450   GFRNONAA >90 06/25/2013 0453   GFRAA >90 06/25/2013 0453   Lab Results  Component Value Date   CHOL 274* 12/08/2012   HDL 51 12/08/2012   LDLCALC 183* 12/08/2012   TRIG 200* 12/08/2012   CHOLHDL 5.4 12/08/2012   Lab Results  Component Value Date   HGBA1C 5.9* 12/07/2012   No results found for this basename: VITAMINB12   Lab Results  Component Value Date   TSH 0.293* 06/22/2013     ASSESSMENT AND PLAN   67 years old Caucasian female, was recurrent episode of transient visual disturbance, transient loss of consciousness, unsure etiology,  1. differentiation diagnosis including posterior TIA, partial seizure, complicated migraines 2.  I will complete evaluation with  cardiac monitoring, EEG 3. 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 return to clinic in one to 2 months.             Levert Feinstein, M.D. Ph.D.  [facility] [address] [place], [place] [address] [phone]

## 2013-07-25 ENCOUNTER — Telehealth: Payer: Self-pay | Admitting: Pulmonary Disease

## 2013-07-25 NOTE — Telephone Encounter (Signed)
Called patient for clarity of message. Pt is sched for ECHO on 07/29/13 and cannot keep, wanted to resched for following week.  Appt has been resched for 08/05/13 @ 3pm. [facility] .Kandice Hams

## 2013-07-26 ENCOUNTER — Ambulatory Visit (INDEPENDENT_AMBULATORY_CARE_PROVIDER_SITE_OTHER): Payer: Medicare Other | Admitting: Radiology

## 2013-07-26 ENCOUNTER — Other Ambulatory Visit: Payer: Self-pay | Admitting: *Deleted

## 2013-07-26 DIAGNOSIS — G2581 Restless legs syndrome: Secondary | ICD-10-CM

## 2013-07-26 DIAGNOSIS — R55 Syncope and collapse: Secondary | ICD-10-CM

## 2013-07-26 DIAGNOSIS — E039 Hypothyroidism, unspecified: Secondary | ICD-10-CM

## 2013-07-26 NOTE — Progress Notes (Signed)
[facility] [facility] calling for another diagnosis code for this event monitor.  I cancelled original order and re did order with syncope diagnosis.

## 2013-07-26 NOTE — Procedures (Signed)
HISTORY: 67 years old female, with recurrent episode of transient visual disturbance, transient loss of consciousness  TECHNIQUE:  16 channel EEG was performed based on standard 10-16 international system. One channel was dedicated to EKG, which has demonstrates normal sinus rhythm of 90 beats per minutes.  Upon awakening, the posterior background activity was well-developed, in alpha range,  9Hz , with amplitude of 50 microvoltage, reactive to eye opening and closure.  There was no evidence of epilepsy for discharge.  Photic stimulation was performed, which induced a symmetric photic driving.  Hyperventilation was performed, there was no abnormality elicit.  No sleep was achieved.  CONCLUSION: This is a  normal awake EEG.  There is no electrodiagnostic evidence of epileptiform discharge

## 2013-07-26 NOTE — Addendum Note (Signed)
Addended byHermenia Fiscal on: 07/26/2013 05:20 PM   Modules accepted: Orders

## 2013-07-29 ENCOUNTER — Other Ambulatory Visit (HOSPITAL_COMMUNITY): Payer: Medicare Other

## 2013-08-05 ENCOUNTER — Other Ambulatory Visit (HOSPITAL_COMMUNITY): Payer: Medicare Other

## 2013-08-12 ENCOUNTER — Other Ambulatory Visit (HOSPITAL_COMMUNITY): Payer: Self-pay | Admitting: Pulmonary Disease

## 2013-08-12 ENCOUNTER — Encounter: Payer: Self-pay | Admitting: Radiology

## 2013-08-12 ENCOUNTER — Encounter (INDEPENDENT_AMBULATORY_CARE_PROVIDER_SITE_OTHER): Payer: Medicare Other

## 2013-08-12 ENCOUNTER — Ambulatory Visit (HOSPITAL_COMMUNITY): Payer: Medicare Other | Attending: Internal Medicine

## 2013-08-12 DIAGNOSIS — I2789 Other specified pulmonary heart diseases: Secondary | ICD-10-CM

## 2013-08-12 DIAGNOSIS — Z87891 Personal history of nicotine dependence: Secondary | ICD-10-CM | POA: Insufficient documentation

## 2013-08-12 DIAGNOSIS — J438 Other emphysema: Secondary | ICD-10-CM | POA: Insufficient documentation

## 2013-08-12 DIAGNOSIS — R55 Syncope and collapse: Secondary | ICD-10-CM

## 2013-08-12 DIAGNOSIS — I272 Pulmonary hypertension, unspecified: Secondary | ICD-10-CM

## 2013-08-12 NOTE — Progress Notes (Signed)
Patient ID: [name], female   DOB: [birth year], 67 y.o.   MRN: [id number] E Cardio verite  30 day monitor applied

## 2013-08-12 NOTE — Progress Notes (Signed)
Echocardiogram performed.  

## 2013-08-17 ENCOUNTER — Encounter: Payer: Self-pay | Admitting: Pulmonary Disease

## 2013-08-18 ENCOUNTER — Other Ambulatory Visit: Payer: Self-pay | Admitting: Pulmonary Disease

## 2013-08-18 DIAGNOSIS — I509 Heart failure, unspecified: Secondary | ICD-10-CM

## 2013-08-25 ENCOUNTER — Ambulatory Visit (INDEPENDENT_AMBULATORY_CARE_PROVIDER_SITE_OTHER): Payer: Medicare Other | Admitting: Cardiovascular Disease

## 2013-08-25 ENCOUNTER — Encounter: Payer: Self-pay | Admitting: Cardiovascular Disease

## 2013-08-25 VITALS — BP 118/76 | HR 102 | Ht 60.0 in | Wt 113.0 lb

## 2013-08-25 DIAGNOSIS — E785 Hyperlipidemia, unspecified: Secondary | ICD-10-CM | POA: Insufficient documentation

## 2013-08-25 DIAGNOSIS — R55 Syncope and collapse: Secondary | ICD-10-CM

## 2013-08-25 DIAGNOSIS — R001 Bradycardia, unspecified: Secondary | ICD-10-CM

## 2013-08-25 DIAGNOSIS — I498 Other specified cardiac arrhythmias: Secondary | ICD-10-CM

## 2013-08-25 NOTE — Progress Notes (Signed)
08/25/2013 [name]   [birth year]  [id number]  Primary Physician DOUGH,ROBERT, MD Primary Cardiologist: Runell Gess MD [name]   HPI:  [name] is a delightful 67 year old thin appearing married Caucasian female mother of 2, grandmother to graduate and works as a Hospital doctor at [employer]. She was referred by Dr. Terrace Arabia, neurologist echo for neurologic, because of a recent abnormal the echocardiogram ordered during the workup of syncope. Her cardiovascular risk factor profile is only remarkable for remote tobacco use and hyperlipidemia intolerant to statin therapy. She is unaware of her family history since she was adopted. She's never had a heart attack or stroke but denies chest pain or shortness of breath. She did have an episode of respiratory arrest, septic shock requiring intubation and ventilation for 3 days. The etiology  was never determined. She is otherwise asymptomatic. She has been a monitor placed on currently.   Current Outpatient Prescriptions  Medication Sig Dispense Refill  . Alpha-Lipoic Acid 100 MG CAPS Take 1 capsule by mouth daily.      Marland Kitchen azelastine (ASTELIN) 137 MCG/SPRAY nasal spray Place 1 spray into the nose 2 (two) times daily. Use in each nostril as directed      . b complex vitamins tablet Take 2 tablets by mouth daily.      . bethanechol (URECHOLINE) 25 MG tablet Take 25 mg by mouth 3 (three) times daily.      . bethanechol (URECHOLINE) 50 MG tablet Take by mouth 2 (two) times daily.      . cholecalciferol (VITAMIN D) 1000 UNITS tablet Take 2,000 Units by mouth daily.       . Coenzyme Q10 (COQ-10) 100 MG CAPS Take 200 mg by mouth every evening.       . cyclobenzaprine (FLEXERIL) 10 MG tablet Take 10 mg by mouth 2 (two) times daily as needed for muscle spasms (for muscle spasams).      . fluticasone (FLONASE) 50 MCG/ACT nasal spray Place 2 sprays into the nose daily.      Marland Kitchen gabapentin (NEURONTIN) 100 MG capsule Take 100-300 mg  by mouth 3 (three) times daily. 200 mg every morning and at noon, and 300 mg every evening      . HYDROcodone-acetaminophen (NORCO/VICODIN) 5-325 MG per tablet Take 1 tablet by mouth every 6 (six) hours as needed for pain (for pain).      Marland Kitchen levothyroxine (SYNTHROID, LEVOTHROID) 50 MCG tablet Take 50 mcg by mouth daily.      . Melatonin 3 MG CAPS Take 3 mg by mouth at bedtime. Takes 2 at hs      . Multiple Vitamin (MULTIVITAMIN WITH MINERALS) TABS Take 1 tablet by mouth daily.      Marland Kitchen omeprazole (PRILOSEC) 40 MG capsule Take 40 mg by mouth every evening.      Marland Kitchen rOPINIRole (REQUIP) 1 MG tablet Take 1 mg by mouth 3 (three) times daily as needed.       . temazepam (RESTORIL) 30 MG capsule Take 30 mg by mouth at bedtime.      . hydroxychloroquine (PLAQUENIL) 200 MG tablet Take 200 mg by mouth. Patient waiting to start after visit w Dr Allyson Sabal       No current facility-administered medications for this visit.    No Known Allergies  History   Social History  . Marital Status: Married    Spouse Name: [name]    Number of Children: 2  . Years of Education: 34  Occupational History  . 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 Smoking status: Former Smoker    Types: Cigarettes    Quit date: 08/25/1993  . Smokeless tobacco: Never Used     Comment: Quit about 20 years before.  . Alcohol Use: 0.0 oz/week  . Drug Use: Not on file  . Sexual Activity: No   Other Topics Concern  . Not on file   Social History Narrative   Patient lives with her husband. ([name]) - Bruce   She is an adopted kid and so is unaware of her family history.   She has 2 children- one has asthma and other has arthritis.                 Review of Systems: General: negative for chills, fever, night sweats or weight changes.  Cardiovascular: negative for chest pain, dyspnea on exertion, edema, orthopnea, palpitations, paroxysmal nocturnal dyspnea or shortness of breath Dermatological:  negative for rash Respiratory: negative for cough or wheezing Urologic: negative for hematuria Abdominal: negative for nausea, vomiting, diarrhea, bright red blood per rectum, melena, or hematemesis Neurologic: negative for visual changes, syncope, or dizziness All other systems reviewed and are otherwise negative except as noted above.    Blood pressure 118/76, pulse 102, height 5' (1.524 m), weight 113 lb (51.256 kg).  General appearance: alert and no distress Neck: no adenopathy, no carotid bruit, no JVD, supple, symmetrical, trachea midline and thyroid not enlarged, symmetric, no tenderness/mass/nodules Lungs: clear to auscultation bilaterally Heart: regular rate and rhythm, S1, S2 normal, no murmur, click, rub or gallop Abdomen: soft, non-tender; bowel sounds normal; no masses,  no organomegaly Extremities: extremities normal, atraumatic, no cyanosis or edema Pulses: 2+ and symmetric  EKG sinus tachycardia 102 without ST or T wave changes  ASSESSMENT AND PLAN:   Syncope Patient has had 2 episodes of syncope the most recent one in February. She has not had a work up until now. She saw Dr. Terrace Arabia , neurologist to go for neurologic, who ordered a 2-D echocardiogram and an event monitor. The echocardiogram showed LV dysfunction though her acoustic windows were poor and this is different from her echo performed in August that showed normal LV dysfunction and grade 1 diastolic dysfunction with mild pulmonary hypertension..because of the poor quality of the most recent echo but repeat this presents with a rash she has normal left function or not I will review the results of the event monitor. If she has no arrhythmias and her EF is normal I will see her back peaked when necessary. She is aware that she cannot drive for 6 months from a syncopal episode of unknown etiology. If she has recurrent episodes she will need a loop recorder implanted.  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

## 2013-08-25 NOTE — Assessment & Plan Note (Signed)
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

## 2013-08-25 NOTE — Patient Instructions (Signed)
  We will see you back in follow up in 1 month with Dr Allyson Sabal.  Dr Allyson Sabal has ordered an echocardiogram.  Please have your neurologist fax Dr Allyson Sabal a copy of your monitor result, [phone]

## 2013-08-25 NOTE — Assessment & Plan Note (Signed)
Patient has had 2 episodes of syncope the most recent one in February. She has not had a work up until now. She saw Dr. Terrace Arabia , neurologist to go for neurologic, who ordered a 2-D echocardiogram and an event monitor. The echocardiogram showed LV dysfunction though her acoustic windows were poor and this is different from her echo performed in August that showed normal LV dysfunction and grade 1 diastolic dysfunction with mild pulmonary hypertension..because of the poor quality of the most recent echo but repeat this presents with a rash she has normal left function or not I will review the results of the event monitor. If she has no arrhythmias and her EF is normal I will see her back peaked when necessary. She is aware that she cannot drive for 6 months from a syncopal episode of unknown etiology. If she has recurrent episodes she will need a loop recorder implanted.

## 2013-09-01 ENCOUNTER — Other Ambulatory Visit: Payer: Self-pay

## 2013-09-13 ENCOUNTER — Ambulatory Visit (HOSPITAL_COMMUNITY)
Admission: RE | Admit: 2013-09-13 | Discharge: 2013-09-13 | Disposition: A | Discharge status: Home / Self Care | Payer: Medicare Other | Source: Ambulatory Visit | Attending: Internal Medicine | Admitting: Internal Medicine

## 2013-09-13 DIAGNOSIS — F17200 Nicotine dependence, unspecified, uncomplicated: Secondary | ICD-10-CM | POA: Insufficient documentation

## 2013-09-13 DIAGNOSIS — R55 Syncope and collapse: Secondary | ICD-10-CM | POA: Insufficient documentation

## 2013-09-13 DIAGNOSIS — I498 Other specified cardiac arrhythmias: Secondary | ICD-10-CM | POA: Insufficient documentation

## 2013-09-13 DIAGNOSIS — E785 Hyperlipidemia, unspecified: Secondary | ICD-10-CM | POA: Insufficient documentation

## 2013-09-13 NOTE — Progress Notes (Signed)
2D Echo Performed 09/13/2013    Lasean Rahming, RCS  

## 2013-09-14 ENCOUNTER — Telehealth: Payer: Self-pay | Admitting: Cardiovascular Disease

## 2013-09-14 NOTE — Telephone Encounter (Signed)
Spoke with patient and gave echo results

## 2013-09-14 NOTE — Telephone Encounter (Signed)
Returning your call,she will only be at this number until 5.

## 2013-09-15 ENCOUNTER — Encounter: Payer: Self-pay | Admitting: Neurology

## 2013-09-15 ENCOUNTER — Ambulatory Visit (INDEPENDENT_AMBULATORY_CARE_PROVIDER_SITE_OTHER): Payer: Medicare Other | Admitting: Neurology

## 2013-09-15 VITALS — BP 127/77 | HR 111 | Ht 61.0 in | Wt 108.0 lb

## 2013-09-15 DIAGNOSIS — R55 Syncope and collapse: Secondary | ICD-10-CM

## 2013-09-15 NOTE — Progress Notes (Signed)
[facility]  PATIENT: [name] DOB: [birth year]  HISTORICAL  Mrs. Estrada is a 67 years old right-handed Caucasian female, accompanied by her husband, referred by her primary care physician Dr. Dina Rich evaluation of recurrent episodes  She had past medical history of hyperlipidemia, denies a previous history of migraine, she drives commercial driving for [employer]  She had a few different recurrent episode since February 2014,  The first episode was December 07 2012, she was sitting at the table in the morning time, without warning signs, she slumped over, difficulty talking, confused, her husband called 911, she was taken to the [facility], MRI of the brain showed mild atrophy, small vessel disease, no acute lesions, MRA of the brain showed no large vessel disease. Ultrasound of carotid artery showed no significant internal carotid artery stenosis, anterior grade flow at posterior circulation. Echocardiogram showed ejection fraction 55-60%, increased the right ventricular pressure, pulmonary arterial pressure was 51 mm,  She had sudden onset of double vision while driving early August 2014, it was horizontal double vision, she did not lost control of her vehicle, lasting 10-15 minutes, disappeared gradually, she did not have a headache,  The third episode was in the mid August, she was watching TV with her husband, she suddenly began to see flashlight at her right visual field, lasting for 10 minutes, no headache, no loss of consciousness,  She also presented to the emergency room in June 24 2013, with abdominal pain, diarrhea, laboratory showed normal BMP, elevated WBC of 17,  She is now back to her baseline, no gait difficulty, denied previous history of seizure, no headaches,  She was admitted to the Intensive care with septic shock (BP 70/20s) (in setting of infectious enterocolitis), bradycardia (35), hypothermia (74F), metabolic acidosis, and  acute encephalopathy in August 26th 2014,  Treatment included: aggressive volume resuscitation, central access, intubation in setting of metabolic acidosis and shock, bicarbonate infusion, vasoactive gtts and empiric antibiotics. She improved clinically with these measures and by 8/27 she was off pressors and extubated. She had both ID and GI consultation by Dr Matthias Hughs, who reviewed the CT scan and interviewed, felt that the her diarrhea and rectal bleeding are most likely a consequence, rather than a cause, of the severe hypotensive episode, with probably some transient colonic ischemia resulting from the hypotension. There is no compelling finding of inflammatory bowel disease on her CT scan.  UPDATE 09/15/2013: Since last clinical visit in September 2014, she was evaluated by cardiologist Dr. Marvis Moeller echocardiogram showed diffuse hypokinesia 40-45%, thought could be related to her recent septic shock, bradycardia, hypothermia, metabolic acidosis, and the medicine treatment, cardiac monitoring has demonstrated sinus tachycardia episodes, she has occasionally lightheadedness when getting up quickly from sitting down position, no more passing out episode,  She will be continued followed up by Dr.Berry in Dec 3rd.  EEG was normal.   REVIEW OF SYSTEMS: Full 14 system review of systems performed and notable only for light headedness. ALLERGIES: No Known Allergies  HOME MEDICATIONS: Outpatient Prescriptions Prior to Visit  Medication Sig Dispense Refill  . Alpha-Lipoic Acid 100 MG CAPS Take 1 capsule by mouth daily.      Marland Kitchen azelastine (ASTELIN) 137 MCG/SPRAY nasal spray Place 1 spray into the nose 2 (two) times daily. Use in each nostril as directed      . b complex vitamins tablet Take 2 tablets by mouth daily.      . bethanechol (URECHOLINE) 50 MG tablet Take by mouth  2 (two) times daily.      . cholecalciferol (VITAMIN D) 1000 UNITS tablet Take 2,000 Units by mouth daily.       . Coenzyme  Q10 (COQ-10) 100 MG CAPS Take 200 mg by mouth every evening.       . cyclobenzaprine (FLEXERIL) 10 MG tablet Take 10 mg by mouth 2 (two) times daily as needed for muscle spasms (for muscle spasams).      . fluticasone (FLONASE) 50 MCG/ACT nasal spray Place 2 sprays into the nose daily.      Marland Kitchen gabapentin (NEURONTIN) 100 MG capsule Take 100-300 mg by mouth 3 (three) times daily. 200 mg every morning and at noon, and 300 mg every evening      . HYDROcodone-acetaminophen (NORCO/VICODIN) 5-325 MG per tablet Take 1 tablet by mouth every 6 (six) hours as needed for pain (for pain).      . hydroxychloroquine (PLAQUENIL) 200 MG tablet Take 200 mg by mouth. Patient waiting to start after visit w Dr Allyson Sabal      . levothyroxine (SYNTHROID, LEVOTHROID) 50 MCG tablet Take 50 mcg by mouth daily.      . Melatonin 3 MG CAPS Take 3 mg by mouth at bedtime. Takes 2 at hs      . Multiple Vitamin (MULTIVITAMIN WITH MINERALS) TABS Take 1 tablet by mouth daily.      Marland Kitchen omeprazole (PRILOSEC) 40 MG capsule Take 40 mg by mouth every evening.      Marland Kitchen rOPINIRole (REQUIP) 1 MG tablet Take 1 mg by mouth 3 (three) times daily as needed.       . temazepam (RESTORIL) 30 MG capsule Take 30 mg by mouth at bedtime.      . bethanechol (URECHOLINE) 25 MG tablet Take 25 mg by mouth 3 (three) times daily.       No facility-administered medications prior to visit.    PAST MEDICAL HISTORY: Past Medical History  Diagnosis Date  . Hip pain   . Back pain   . GERD (gastroesophageal reflux disease)   . Hyperthyroidism   . Arthritis   . Restless leg syndrome   . Ex-smoker Quit 15 years ago    Smoked 1 PPD x 20 years  . Syncope   . Hyperlipidemia     PAST SURGICAL HISTORY: Past Surgical History  Procedure Laterality Date  . Right rotator cuff repair    . Esophageal dilation      X 2  . Bladder suspension      FAMILY HISTORY: Family History  Problem Relation Age of Onset  . Adopted: Yes    SOCIAL HISTORY:    PHYSICAL  EXAM   Filed Vitals:   09/15/13 1502  BP: 127/77  Pulse: 111  Height: 5\' 1"  (1.549 m)  Weight: 108 lb (48.988 kg)     Body mass index is 20.42 kg/(m^2).   Generalized: In no acute distress  Neck: Supple, no carotid bruits   Cardiac: Regular rate rhythm  Pulmonary: Clear to auscultation bilaterally  Musculoskeletal: No deformity  Neurological examination  Mentation: Alert oriented to time, place, history taking, and causual conversation, MMSE 27/30, she missed 1/3 recall, is not oriented to Date and Place.  Cranial nerve II-XII: Pupils were equal round reactive to light extraocular movements were full, visual field were full on confrontational test. facial sensation and strength were normal. hearing was intact to finger rubbing bilaterally. Uvula tongue midline.  head turning and shoulder shrug and were normal and symmetric.Tongue protrusion into  cheek strength was normal.  Motor: normal tone, bulk and strength.  Sensory: Intact to fine touch, pinprick, preserved vibratory sensation, and proprioception at toes.  Coordination: Normal finger to nose, heel-to-shin bilaterally there was no truncal ataxia  Gait: Rising up from seated position without assistance, normal stance, without trunk ataxia, moderate stride, good arm swing, smooth turning, able to perform tiptoe, and heel walking without difficulty.   Romberg signs: Negative  Deep tendon reflexes: Brachioradialis 2/2, biceps 2/2, triceps 2/2, patellar 2/2, Achilles 2/2, plantar responses were flexor bilaterally.   DIAGNOSTIC DATA (LABS, IMAGING, TESTING) - I reviewed patient records, labs, notes, testing and imaging myself where available.  Lab Results  Component Value Date   WBC 17.0* 06/24/2013   HGB 11.6* 06/24/2013   HCT 32.7* 06/24/2013   MCV 86.1 06/24/2013   PLT 233 06/24/2013      Component Value Date/Time   NA 137 06/25/2013 0453   K 3.8 06/25/2013 0453   CL 102 06/25/2013 0453   CO2 22 06/25/2013 0453    GLUCOSE 102* 06/25/2013 0453   BUN 7 06/25/2013 0453   CREATININE 0.52 06/25/2013 0453   CALCIUM 8.9 06/25/2013 0453   PROT 5.7* 06/23/2013 0450   ALBUMIN 2.9* 06/23/2013 0450   AST 39* 06/23/2013 0450   ALT 31 06/23/2013 0450   ALKPHOS 68 06/23/2013 0450   BILITOT 0.2* 06/23/2013 0450   GFRNONAA >90 06/25/2013 0453   GFRAA >90 06/25/2013 0453   Lab Results  Component Value Date   CHOL 274* 12/08/2012   HDL 51 12/08/2012   LDLCALC 183* 12/08/2012   TRIG 200* 12/08/2012   CHOLHDL 5.4 12/08/2012   Lab Results  Component Value Date   HGBA1C 5.9* 12/07/2012   No results found for this basename: VITAMINB12   Lab Results  Component Value Date   TSH 0.293* 06/22/2013     ASSESSMENT AND PLAN   67 years old Caucasian female, was recurrent episode of transient visual disturbance, transient loss of consciousness, unsure etiology,  1. Differentiation diagnosis including syncope,  complicated migraines 2.   document all the events, return to clinic in 6 months, keep cardiology followup,     Levert Feinstein, M.D. Ph.D.  [facility] [address] [place], [place] [address] [phone]

## 2013-09-28 ENCOUNTER — Ambulatory Visit (INDEPENDENT_AMBULATORY_CARE_PROVIDER_SITE_OTHER): Payer: Medicare Other | Admitting: Cardiovascular Disease

## 2013-09-28 ENCOUNTER — Encounter: Payer: Self-pay | Admitting: Cardiovascular Disease

## 2013-09-28 VITALS — BP 120/78 | HR 100 | Ht 60.0 in | Wt 112.2 lb

## 2013-09-28 DIAGNOSIS — I519 Heart disease, unspecified: Secondary | ICD-10-CM

## 2013-09-28 MED ORDER — CARVEDILOL 6.25 MG PO TABS: 6.2500 mg | ORAL_TABLET | Freq: Two times a day (BID) | ORAL | Status: DC

## 2013-09-28 NOTE — Assessment & Plan Note (Signed)
History of presyncope back in February with no etiology. Event monitor is unrevealing.

## 2013-09-28 NOTE — Assessment & Plan Note (Signed)
Recent 2-D echo performed in our office 09/13/13 revealed an ejection fraction of 40-45% with diffuse hypokinesia. She had an echo performed August that showed normal systolic function. She is mildly tachycardic. Unsure of the etiology of her change in EF may be related to her episode of septic shock in August. She is otherwise asymptomatic and is fairly active, and hikes without limitation. I am going to start her on low-dose beta blocker.

## 2013-09-28 NOTE — Patient Instructions (Signed)
  Your physician wants you to follow-up with him in : 1 year with Dr San Morelle will receive a reminder letter in the mail one month in advance. If you don't receive a letter, please call our office to schedule the follow-up appointment.    Your physician has recommended you make the following change in your medication: start Coreg 6.25mg  twice a day   Your physician has ordered the following tests: an echocardiogram in 6 months

## 2013-09-28 NOTE — Progress Notes (Signed)
09/28/2013 [name]   1946/01/27  [id number]  Primary Physician DOUGH,ROBERT, MD Primary Cardiologist: Runell Gess MD [name]   HPI:  Ms. 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 She was referred by Dr. Terrace Arabia, neurologist , because of a recent abnormal echocardiogram ordered during the workup of syncope. Her cardiovascular risk factor profile is only remarkable for remote tobacco use and hyperlipidemia intolerant to statin therapy. She is unaware of her family history since she was adopted. She's never had a heart attack or stroke and denies chest pain or shortness of breath. She did have an episode of respiratory arrest, septic shock requiring intubation and ventilation for 3 days. The etiology was never determined. She is otherwise asymptomatic. A cardiac event monitor was placed which was unrevealing. I performed 2-D echocardiography and possibly suggested that showed a decline in her ejection fraction from normal range to 40-45% for unclear reasons.  Current Outpatient Prescriptions  Medication Sig Dispense Refill  . Alpha-Lipoic Acid 100 MG CAPS Take 1 capsule by mouth daily.      Marland Kitchen atorvastatin (LIPITOR) 10 MG tablet Take 5 mg by mouth every other day.      Marland Kitchen azelastine (ASTELIN) 137 MCG/SPRAY nasal spray Place 1 spray into the nose 2 (two) times daily. Use in each nostril as directed      . b complex vitamins tablet Take 2 tablets by mouth daily.      . bethanechol (URECHOLINE) 50 MG tablet Take by mouth 2 (two) times daily.      . cholecalciferol (VITAMIN D) 1000 UNITS tablet Take 2,000 Units by mouth daily.       . Coenzyme Q10 (COQ-10) 100 MG CAPS Take 200 mg by mouth every evening.       . cyclobenzaprine (FLEXERIL) 10 MG tablet Take 10 mg by mouth 2 (two) times daily as needed for muscle spasms (for muscle spasams).      . fluticasone (FLONASE) 50 MCG/ACT  nasal spray Place 2 sprays into the nose daily.      Marland Kitchen gabapentin (NEURONTIN) 100 MG capsule Take 100-300 mg by mouth 3 (three) times daily. 200 mg every morning and at noon, and 300 mg every evening      . HYDROcodone-acetaminophen (NORCO/VICODIN) 5-325 MG per tablet Take 1 tablet by mouth every 6 (six) hours as needed for pain (for pain).      . hydroxychloroquine (PLAQUENIL) 200 MG tablet Take 200 mg by mouth. Patient waiting to start after visit w Dr Allyson Sabal      . levothyroxine (SYNTHROID, LEVOTHROID) 50 MCG tablet Take 50 mcg by mouth daily.      . Melatonin 3 MG CAPS Take 3 mg by mouth at bedtime. Takes 2 at hs      . Multiple Vitamin (MULTIVITAMIN WITH MINERALS) TABS Take 1 tablet by mouth daily.      Marland Kitchen omeprazole (PRILOSEC) 40 MG capsule Take 40 mg by mouth every evening.      . Probiotic Product (ALIGN PO) Take by mouth daily.      Marland Kitchen rOPINIRole (REQUIP) 1 MG tablet Take 1 mg by mouth 3 (three) times daily as needed.       . temazepam (RESTORIL) 30 MG capsule Take 30 mg by mouth at bedtime.      . carvedilol (COREG) 6.25 MG tablet Take 1 tablet (6.25 mg total) by mouth 2 (  two) times daily.  180 tablet  3   No current facility-administered medications for this visit.    No Known Allergies  History   Social History  . Marital Status: Married    Spouse Name: [name]    Number of Children: 2  . Years of Education: 13   Occupational History  . 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 Smoking status: Former Smoker    Types: Cigarettes    Quit date: 08/25/1993  . Smokeless tobacco: Never Used     Comment: Quit about 20 years before.  . Alcohol Use: 0.0 oz/week  . Drug Use: Not on file  . Sexual Activity: No   Other Topics Concern  . Not on file   Social History Narrative   Patient lives with her husband. ([name]) - [place]   She is an adopted kid and so is unaware of her family history.   She has 2 children- one has asthma and other has arthritis.                  Review of Systems: General: negative for chills, fever, night sweats or weight changes.  Cardiovascular: negative for chest pain, dyspnea on exertion, edema, orthopnea, palpitations, paroxysmal nocturnal dyspnea or shortness of breath Dermatological: negative for rash Respiratory: negative for cough or wheezing Urologic: negative for hematuria Abdominal: negative for nausea, vomiting, diarrhea, bright red blood per rectum, melena, or hematemesis Neurologic: negative for visual changes, syncope, or dizziness All other systems reviewed and are otherwise negative except as noted above.    Blood pressure 120/78, pulse 100, height 5' (1.524 m), weight 112 lb 3.2 oz (50.894 kg).  General appearance: alert and no distress Neck: no adenopathy, no carotid bruit, no JVD, supple, symmetrical, trachea midline and thyroid not enlarged, symmetric, no tenderness/mass/nodules Lungs: clear to auscultation bilaterally Heart: regular rate and rhythm, S1, S2 normal, no murmur, click, rub or gallop Extremities: extremities normal, atraumatic, no cyanosis or edema  EKG not performed today  ASSESSMENT AND PLAN:   Syncope History of presyncope back in February with no etiology. Event monitor is unrevealing.  LV dysfunction Recent 2-D echo performed in our office 09/13/13 revealed an ejection fraction of 40-45% with diffuse hypokinesia. She had an echo performed August that showed normal systolic function. She is mildly tachycardic. Unsure of the etiology of her change in EF may be related to her episode of septic shock in August. She is otherwise asymptomatic and is fairly active, and hikes without limitation. I am going to start her on low-dose beta blocker.      Runell Gess MD FACP,FACC,FAHA, [facility] 09/28/2013 3:22 PM

## 2013-12-06 ENCOUNTER — Telehealth: Payer: Self-pay | Admitting: Cardiovascular Disease

## 2013-12-06 NOTE — Telephone Encounter (Signed)
Returned call and pt verified x 2.  Pt stated she has been looking into the medications she is taking and it has to be the carvedilol.  Pt c/o red, swollen cheeks and weight gain x 4 lbs (2 pant sizes) since she was last seen.  Pt c/o red, swollen cheeks w/ warmness x 1-2 weeks.  Stated rheumatologist stated it is not the methotrexate at [place] yesterday, which is the other new medicine she was started on in addition to the carvedilol.  Pt denied CP, SOB or other symptoms.  Pt informed Erasmo Downer, our pharmacist, will be notified to review and advise.  Also informed Dr. Gwenlyn Found may be notified if unable to resolve w/ pharmacist.  Pt verbalized understanding and agreed w/ plan.  Message forwarded to K. Alvstad, PharmD.

## 2013-12-06 NOTE — Telephone Encounter (Signed)
Please call,thinks she is having side effects from her Carvedilol.

## 2013-12-06 NOTE — Telephone Encounter (Signed)
[name]  Please review with Dr. Gwenlyn Found.  Wt gain not uncommon with beta blockers, but if she needs to be on one, maybe decrease dose to 3.125mg  bid.  Not sure about cheek swelling as a side effect.

## 2013-12-07 NOTE — Telephone Encounter (Signed)
Please advise 

## 2013-12-08 NOTE — Telephone Encounter (Signed)
Verbal order from Dr Gwenlyn Found to have patient come in to see an extender to discuss possibly changing the beta blocker.

## 2013-12-08 NOTE — Telephone Encounter (Signed)
I spoke with patient.  She is not having any SOB only weight gain by 3-4 pounds and red cheeks.  This has been occuring for weeks.  I made her an appt for 2-18. Pt agreeable.

## 2013-12-14 ENCOUNTER — Ambulatory Visit: Payer: Medicare Other | Admitting: Cardiology

## 2013-12-21 ENCOUNTER — Encounter: Payer: Self-pay | Admitting: Physician Assistant

## 2013-12-21 ENCOUNTER — Ambulatory Visit (INDEPENDENT_AMBULATORY_CARE_PROVIDER_SITE_OTHER): Payer: Medicare Other | Admitting: Physician Assistant

## 2013-12-21 VITALS — BP 130/88 | HR 100 | Ht 60.0 in | Wt 115.9 lb

## 2013-12-21 DIAGNOSIS — I2789 Other specified pulmonary heart diseases: Secondary | ICD-10-CM

## 2013-12-21 DIAGNOSIS — R609 Edema, unspecified: Secondary | ICD-10-CM

## 2013-12-21 DIAGNOSIS — R600 Localized edema: Secondary | ICD-10-CM | POA: Insufficient documentation

## 2013-12-21 DIAGNOSIS — I272 Pulmonary hypertension, unspecified: Secondary | ICD-10-CM

## 2013-12-21 NOTE — Assessment & Plan Note (Signed)
BP well controlled.

## 2013-12-21 NOTE — Assessment & Plan Note (Signed)
The patient was concerned that the coreg was causing a rash and edema in her cheeks.  She is currently wearing cover-up makeup.  Her cheeks look a little pink but it is hard to tell.  Perhaps she has rosacea.  She is also on methotrexate for RA.  I would suspect this might be causing the rash, however, she reports being on both meds since December.  Recommend a trial off but I think she should contact the provider who added it first.  If she sees a dermatologist, I suggested she not wear and make-up.

## 2013-12-21 NOTE — Patient Instructions (Signed)
1.  Follow up with Dr. Gwenlyn Found in June.

## 2013-12-21 NOTE — Progress Notes (Signed)
Date:  12/21/2013   ID:  [name], DOB [birth year], MRN [id number]  PCP:  Teressa Lower, MD  Primary Cardiologist:  Gwenlyn Found     History of Present Illness: [name] is a 68 y.o. female mother of 2, who works as a Geophysicist/field seismologist at [employer]. She last saw Dr. Gwenlyn Found in December because she was referred by Dr. Krista Blue, neurologist , because of a recent abnormal echocardiogram ordered during the workup of syncope.   He started her on coreg for mild tachycardia.  Her cardiovascular risk factor profile is only remarkable for remote tobacco use and hyperlipidemia intolerant to statin therapy. She is unaware of her family history since she was adopted. She's never had a heart attack or stroke. She did have an episode of respiratory arrest, septic shock requiring intubation and ventilation for 3 days. The etiology was never determined. She is otherwise asymptomatic. A cardiac event monitor was placed which was unrevealing.  2-D echocardiography revealed an ejection fraction of 40-45% with diffuse hypokinesis for unclear reasons.    The patient presents today for evaluation of facial edema and a rash on her cheeks which started about two weeks ago.  She was concerned that it was caused by the coreg, which along with methotrexate, were started in December.  She is leaving for [place] in March for about 16 days.  She had all the required for travel to [place] shots in the beginning of February.  The patient currently denies nausea, vomiting, fever, chest pain, shortness of breath, orthopnea, dizziness, PND, cough, congestion, abdominal pain, hematochezia, melena, lower extremity edema, claudication.  Wt Readings from Last 3 Encounters:  12/21/13 115 lb 14.4 oz (52.572 kg)  09/28/13 112 lb 3.2 oz (50.894 kg)  09/15/13 108 lb (48.988 kg)     Past Medical History  Diagnosis Date  . Hip pain   . Back pain   . GERD (gastroesophageal reflux disease)   . Hyperthyroidism   . Arthritis   . Restless  leg syndrome   . Ex-smoker Quit 15 years ago    Smoked 1 PPD x 20 years  . Syncope   . Hyperlipidemia     Current Outpatient Prescriptions  Medication Sig Dispense Refill  . Alpha-Lipoic Acid 100 MG CAPS Take 1 capsule by mouth daily.      Marland Kitchen atorvastatin (LIPITOR) 10 MG tablet Take 5 mg by mouth every other day.      Marland Kitchen azelastine (ASTELIN) 137 MCG/SPRAY nasal spray Place 1 spray into the nose 2 (two) times daily. Use in each nostril as directed      . b complex vitamins tablet Take 2 tablets by mouth daily.      . bethanechol (URECHOLINE) 50 MG tablet Take by mouth 2 (two) times daily.      . carvedilol (COREG) 6.25 MG tablet Take 1 tablet (6.25 mg total) by mouth 2 (two) times daily.  180 tablet  3  . cholecalciferol (VITAMIN D) 1000 UNITS tablet Take 2,000 Units by mouth daily.       . Coenzyme Q10 (COQ-10) 100 MG CAPS Take 200 mg by mouth every evening.       . cyclobenzaprine (FLEXERIL) 10 MG tablet Take 10 mg by mouth 2 (two) times daily as needed for muscle spasms (for muscle spasams).      . fluticasone (FLONASE) 50 MCG/ACT nasal spray Place 2 sprays into the nose daily.      Marland Kitchen gabapentin (NEURONTIN) 100 MG capsule Take 100-300  mg by mouth 3 (three) times daily. 200 mg every morning and at noon, and 300 mg every evening      . HYDROcodone-acetaminophen (NORCO/VICODIN) 5-325 MG per tablet Take 1 tablet by mouth every 6 (six) hours as needed for pain (for pain).      . hydroxychloroquine (PLAQUENIL) 200 MG tablet Take 200 mg by mouth daily.       Marland Kitchen levothyroxine (SYNTHROID, LEVOTHROID) 50 MCG tablet Take 50 mcg by mouth daily.      . Melatonin 3 MG CAPS Take 3 mg by mouth at bedtime. Takes 2 at hs      . Methotrexate Sodium, PF, 50 MG/2ML SOLN       . Multiple Vitamin (MULTIVITAMIN WITH MINERALS) TABS Take 1 tablet by mouth daily.      Marland Kitchen omeprazole (PRILOSEC) 40 MG capsule Take 40 mg by mouth every evening.      Marland Kitchen rOPINIRole (REQUIP) 1 MG tablet Take 1 mg by mouth 3 (three) times  daily as needed.       . temazepam (RESTORIL) 30 MG capsule Take 30 mg by mouth at bedtime.       No current facility-administered medications for this visit.    Allergies:   No Known Allergies  Social History:  The patient  reports that she quit smoking about 20 years ago. Her smoking use included Cigarettes. She smoked 0.00 packs per day. She has never used smokeless tobacco. She reports that she drinks alcohol.   Family history:   Family History  Problem Relation Age of Onset  . Adopted: Yes    ROS:  Please see the history of present illness.  All other systems reviewed and negative.   PHYSICAL EXAM: VS:  BP 130/88  Pulse 100  Ht 5' (1.524 m)  Wt 115 lb 14.4 oz (52.572 kg)  BMI 22.64 kg/m2 Well nourished, well developed, in no acute distress HEENT: Pupils are equal round react to light accommodation extraocular movements are intact.  I could not identify any edema.  It is not obvious and I have not seen her before.   Neck: no JVDNo cervical lymphadenopathy. Cardiac: Regular rate and rhythm without murmurs rubs or gallops. Lungs:  clear to auscultation bilaterally, no wheezing, rhonchi or rales Abd: soft, nontender, positive bowel sounds all quadrants, no hepatosplenomegaly Ext: no lower extremity edema.  2+ radial and dorsalis pedis pulses. Skin: warm and dry.  Her cheeks are mildly pink. Neuro:  Grossly normal    ASSESSMENT AND PLAN:  Problem List Items Addressed This Visit   Pulmonary hypertension      BP well controlled.     Facial edema - Primary     The patient was concerned that the coreg was causing a rash and edema in her cheeks.  She is currently wearing cover-up makeup.  Her cheeks look a little pink but it is hard to tell.  Perhaps she has rosacea.  She is also on methotrexate for RA.  I would suspect this might be causing the rash, however, she reports being on both meds since December.  Recommend a trial off but I think she should contact the provider who  added it first.  If she sees a dermatologist, I suggested she not wear and make-up.

## 2014-01-07 ENCOUNTER — Encounter: Payer: Self-pay | Admitting: *Deleted

## 2014-04-10 ENCOUNTER — Ambulatory Visit (INDEPENDENT_AMBULATORY_CARE_PROVIDER_SITE_OTHER): Payer: Medicare Other | Admitting: Cardiovascular Disease

## 2014-04-10 ENCOUNTER — Encounter: Payer: Self-pay | Admitting: Cardiovascular Disease

## 2014-04-10 VITALS — BP 142/76 | HR 96 | Ht 60.0 in | Wt 115.7 lb

## 2014-04-10 DIAGNOSIS — E785 Hyperlipidemia, unspecified: Secondary | ICD-10-CM

## 2014-04-10 DIAGNOSIS — I498 Other specified cardiac arrhythmias: Secondary | ICD-10-CM

## 2014-04-10 DIAGNOSIS — R55 Syncope and collapse: Secondary | ICD-10-CM

## 2014-04-10 DIAGNOSIS — R001 Bradycardia, unspecified: Secondary | ICD-10-CM

## 2014-04-10 DIAGNOSIS — I519 Heart disease, unspecified: Secondary | ICD-10-CM

## 2014-04-10 NOTE — Patient Instructions (Signed)
  We will see you back in follow up in 1 year with Dr Gwenlyn Found.  Dr Gwenlyn Found has ordered an echocardiogram. Echocardiography is a painless test that uses sound waves to create images of your heart. It provides your doctor with information about the size and shape of your heart and how well your heart's chambers and valves are working. This procedure takes approximately one hour. There are no restrictions for this procedure.

## 2014-04-10 NOTE — Progress Notes (Signed)
04/10/2014 [name]   December 12, 1945  [id number]  Primary Physician [place], [place] Primary Cardiologist: Lorretta Harp MD Renae Gloss    HPI:  [name] is a delightful 68 year old thin appearing married Caucasian female mother of 2, who works as a Geophysicist/field seismologist at [employer]. She was referred by Dr. Krista Blue, neurologist , because of a recent abnormal echocardiogram ordered during the workup of syncope. Her cardiovascular risk factor profile is only remarkable for remote tobacco use and hyperlipidemia intolerant to statin therapy. She is unaware of her family history since she was adopted. She's never had a heart attack or stroke and denies chest pain or shortness of breath. She did have an episode of respiratory arrest, septic shock requiring intubation and ventilation for 3 days. The etiology was never determined. She is otherwise asymptomatic. A cardiac event monitor was placed which was unrevealing. I performed 2-D echocardiography and possibly suggested that showed a decline in her ejection fraction from normal range to 40-45% for unclear reasons since I saw her about 6 months ago she is mechanically stable. She did go to [place] for 2 weeks of up a vacation. She denies chest pain or shortness of breath    Current Outpatient Prescriptions  Medication Sig Dispense Refill  . Alpha-Lipoic Acid 100 MG CAPS Take 1 capsule by mouth daily.      Marland Kitchen atorvastatin (LIPITOR) 10 MG tablet Take 5 mg by mouth every other day.      Marland Kitchen azelastine (ASTELIN) 137 MCG/SPRAY nasal spray Place 1 spray into the nose 2 (two) times daily. Use in each nostril as directed      . b complex vitamins tablet Take 2 tablets by mouth daily.      . bethanechol (URECHOLINE) 50 MG tablet Take by mouth 2 (two) times daily.      . carvedilol (COREG) 6.25 MG tablet Take 1 tablet (6.25 mg total) by mouth 2 (two) times daily.  180 tablet  3  . cholecalciferol (VITAMIN D) 1000 UNITS tablet Take 2,000 Units by  mouth daily.       . Coenzyme Q10 (COQ-10) 100 MG CAPS Take 200 mg by mouth every evening.       . cyclobenzaprine (FLEXERIL) 10 MG tablet Take 10 mg by mouth 2 (two) times daily as needed for muscle spasms (for muscle spasams).      . fluticasone (FLONASE) 50 MCG/ACT nasal spray Place 2 sprays into the nose daily.      Marland Kitchen gabapentin (NEURONTIN) 100 MG capsule Take 100-300 mg by mouth 3 (three) times daily. 200 mg every morning and at noon, and 300 mg every evening      . HYDROcodone-acetaminophen (NORCO/VICODIN) 5-325 MG per tablet Take 1 tablet by mouth every 6 (six) hours as needed for pain (for pain).      . hydroxychloroquine (PLAQUENIL) 200 MG tablet Take 200 mg by mouth daily.       Marland Kitchen levothyroxine (SYNTHROID, LEVOTHROID) 50 MCG tablet Take 50 mcg by mouth daily.      . Melatonin 3 MG CAPS Take 3 mg by mouth at bedtime. Takes 2 at hs      . Methotrexate Sodium, PF, 50 MG/2ML SOLN Inject 60 mg as directed once a week.       . Multiple Vitamin (MULTIVITAMIN WITH MINERALS) TABS Take 1 tablet by mouth daily.      Marland Kitchen omeprazole (PRILOSEC) 40 MG capsule Take 40 mg by mouth every evening.      Marland Kitchen  rOPINIRole (REQUIP) 1 MG tablet Take 1 mg by mouth 3 (three) times daily as needed.       . temazepam (RESTORIL) 30 MG capsule Take 30 mg by mouth at bedtime.       No current facility-administered medications for this visit.    No Known Allergies  History   Social History  . Marital Status: Married    Spouse Name: [name]    Number of Children: 2  . Years of Education: 13   Occupational History  . Driver Musician     Part time    Social History Main Topics  . Smoking status: Former Smoker    Types: Cigarettes    Quit date: 08/25/1993  . Smokeless tobacco: Never Used     Comment: Quit about 20 years before.  . Alcohol Use: 0.0 oz/week  . Drug Use: Not on file  . Sexual Activity: No   Other Topics Concern  . Not on file   Social History Narrative   Patient lives with her  husband. ([name]) - [place]   She is an adopted kid and so is unaware of her family history.   She has 2 children- one has asthma and other has arthritis.                 Review of Systems: General: negative for chills, fever, night sweats or weight changes.  Cardiovascular: negative for chest pain, dyspnea on exertion, edema, orthopnea, palpitations, paroxysmal nocturnal dyspnea or shortness of breath Dermatological: negative for rash Respiratory: negative for cough or wheezing Urologic: negative for hematuria Abdominal: negative for nausea, vomiting, diarrhea, bright red blood per rectum, melena, or hematemesis Neurologic: negative for visual changes, syncope, or dizziness All other systems reviewed and are otherwise negative except as noted above.    Blood pressure 142/76, pulse 96, height 5' (1.524 m), weight 115 lb 11.2 oz (52.481 kg).  General appearance: alert and no distress Neck: no adenopathy, no carotid bruit, no JVD, supple, symmetrical, trachea midline and thyroid not enlarged, symmetric, no tenderness/mass/nodules Lungs: clear to auscultation bilaterally Heart: regular rate and rhythm, S1, S2 normal, no murmur, click, rub or gallop Extremities: extremities normal, atraumatic, no cyanosis or edema  EKG normal sinus rhythm at 96 without ST or T wave changes  ASSESSMENT AND PLAN:   Hyperlipidemia Statin intolerant  LV dysfunction Ejection fraction was 40-45% when last checked. He put her on low-dose beta blocker. I'm going to recheck a 2-D echocardiogram      Lorretta Harp MD [facility], [facility] 04/10/2014 3:24 PM

## 2014-04-10 NOTE — Assessment & Plan Note (Signed)
Statin intolerant 

## 2014-04-10 NOTE — Assessment & Plan Note (Signed)
Ejection fraction was 40-45% when last checked. He put her on low-dose beta blocker. I'm going to recheck a 2-D echocardiogram

## 2014-04-21 ENCOUNTER — Ambulatory Visit: Payer: Medicare Other | Admitting: Nurse Practitioner

## 2014-04-26 ENCOUNTER — Ambulatory Visit (HOSPITAL_COMMUNITY)
Admission: RE | Admit: 2014-04-26 | Discharge: 2014-04-26 | Disposition: A | Discharge status: Home / Self Care | Payer: Medicare Other | Source: Ambulatory Visit | Attending: Cardiovascular Disease | Admitting: Cardiovascular Disease

## 2014-04-26 DIAGNOSIS — I519 Heart disease, unspecified: Secondary | ICD-10-CM | POA: Diagnosis not present

## 2014-04-26 DIAGNOSIS — E785 Hyperlipidemia, unspecified: Secondary | ICD-10-CM | POA: Insufficient documentation

## 2014-04-26 DIAGNOSIS — R001 Bradycardia, unspecified: Secondary | ICD-10-CM

## 2014-04-26 NOTE — Progress Notes (Signed)
2D Echocardiogram Complete.  04/26/2014   [name], RDCS

## 2014-05-10 ENCOUNTER — Other Ambulatory Visit: Payer: Self-pay | Admitting: Neurosurgery

## 2014-05-15 NOTE — Pre-Procedure Instructions (Signed)
ALEATHIA PURDY  05/15/2014   Your procedure is scheduled on:  Friday, July 24.  Report to [facility] Admitting at 8:14 AM.  Call this number if you have problems the morning of surgery: [phone]   Remember:   Do not eat food or drink liquids after midnight Thursday, July 23.        Take these medicines the morning of surgery with A SIP OF WATER: carvedilol (COREG), cyclobenzaprine (FLEXERIL),  gabapentin (NEURONTIN), levothyroxine (SYNTHROID, LEVOTHROID).              May use nasal sprays.                       Take if needed:rOPINIRole (REQUIP), HYDROcodone-acetaminophen (NORCO/VICODIN).               Stop taking Vitamins and Herbal Medications.    Do not wear jewelry, make-up or nail polish.  Do not wear lotions, powders, or perfumes.   Do not shave 48 hours prior to surgery.   Do not bring valuables to the hospital.             [facility] is not responsible  for any belongings or valuables.               Contacts, dentures or bridgework may not be worn into surgery.  Leave suitcase in the car. After surgery it may be brought to your room.  For patients admitted to the hospital, discharge time is determined by your treatment team.             Special Instructions: Review  [facility] - Preparing For Surgery.   Please read over the following fact sheets that you were given: Pain Booklet, Coughing and Deep Breathing, Blood Transfusion Information and Surgical Site Infection Prevention

## 2014-05-16 ENCOUNTER — Encounter (HOSPITAL_COMMUNITY)
Admission: RE | Admit: 2014-05-16 | Discharge: 2014-05-16 | Disposition: A | Discharge status: Home / Self Care | Payer: Medicare Other | Source: Ambulatory Visit | Attending: Neurosurgery | Admitting: Neurosurgery

## 2014-05-16 ENCOUNTER — Encounter (HOSPITAL_COMMUNITY): Payer: Self-pay

## 2014-05-16 ENCOUNTER — Encounter (HOSPITAL_COMMUNITY)
Admission: RE | Admit: 2014-05-16 | Discharge: 2014-05-16 | Disposition: A | Discharge status: Home / Self Care | Payer: Medicare Other | Source: Ambulatory Visit | Attending: Anesthesiology | Admitting: Anesthesiology

## 2014-05-16 HISTORY — DX: Cardiac arrhythmia, unspecified: I49.9

## 2014-05-16 LAB — CBC
HCT: 38.4 % (ref 36.0–46.0)
HEMOGLOBIN: 12.7 g/dL (ref 12.0–15.0)
MCH: 29.5 pg (ref 26.0–34.0)
MCHC: 33.1 g/dL (ref 30.0–36.0)
MCV: 89.1 fL (ref 78.0–100.0)
Platelets: 303 10*3/uL (ref 150–400)
RBC: 4.31 MIL/uL (ref 3.87–5.11)
RDW: 16.4 % — ABNORMAL HIGH (ref 11.5–15.5)
WBC: 6.4 10*3/uL (ref 4.0–10.5)

## 2014-05-16 LAB — BASIC METABOLIC PANEL
Anion gap: 11 (ref 5–15)
BUN: 14 mg/dL (ref 6–23)
CHLORIDE: 99 meq/L (ref 96–112)
CO2: 27 meq/L (ref 19–32)
Calcium: 9.3 mg/dL (ref 8.4–10.5)
Creatinine, Ser: 0.53 mg/dL (ref 0.50–1.10)
GFR calc Af Amer: >90 mL/min (ref >90)
GLUCOSE: 90 mg/dL (ref 70–99)
Potassium: 4.8 mEq/L (ref 3.7–5.3)
SODIUM: 137 meq/L (ref 137–147)

## 2014-05-16 LAB — SURGICAL PCR SCREEN
MRSA, PCR: NEGATIVE
Staphylococcus aureus: NEGATIVE

## 2014-05-17 LAB — TYPE AND SCREEN
ABO/RH(D): A POS
Antibody Screen: NEGATIVE

## 2014-05-18 MED ORDER — DEXAMETHASONE SODIUM PHOSPHATE 10 MG/ML IJ SOLN
10.0000 mg | INTRAMUSCULAR | Status: AC
  Administered 2014-05-19: 10 mg via INTRAVENOUS

## 2014-05-18 MED ORDER — CEFAZOLIN SODIUM-DEXTROSE 2-3 GM-% IV SOLR
2.0000 g | INTRAVENOUS | Status: AC
  Administered 2014-05-19 (×2): 2 g via INTRAVENOUS
  Filled 2014-05-18: qty 50

## 2014-05-19 ENCOUNTER — Inpatient Hospital Stay (HOSPITAL_COMMUNITY)
Admission: RE | Admit: 2014-05-19 | Discharge: 2014-05-22 | DRG: 460 | Disposition: A | Discharge status: Home / Self Care | Payer: Medicare Other | Source: Ambulatory Visit | Attending: Neurosurgery | Admitting: Neurosurgery

## 2014-05-19 ENCOUNTER — Encounter (HOSPITAL_COMMUNITY): Payer: Self-pay | Admitting: *Deleted

## 2014-05-19 ENCOUNTER — Inpatient Hospital Stay (HOSPITAL_COMMUNITY): Payer: Medicare Other

## 2014-05-19 ENCOUNTER — Encounter (HOSPITAL_COMMUNITY)
Admission: RE | Disposition: A | Discharge status: Home / Self Care | Payer: Self-pay | Source: Ambulatory Visit | Attending: Neurosurgery

## 2014-05-19 ENCOUNTER — Inpatient Hospital Stay (HOSPITAL_COMMUNITY): Payer: Medicare Other | Admitting: Anesthesiology

## 2014-05-19 ENCOUNTER — Encounter (HOSPITAL_COMMUNITY): Payer: Medicare Other | Admitting: Anesthesiology

## 2014-05-19 DIAGNOSIS — M79609 Pain in unspecified limb: Secondary | ICD-10-CM | POA: Diagnosis present

## 2014-05-19 DIAGNOSIS — Z79899 Other long term (current) drug therapy: Secondary | ICD-10-CM | POA: Diagnosis not present

## 2014-05-19 DIAGNOSIS — Z01812 Encounter for preprocedural laboratory examination: Secondary | ICD-10-CM

## 2014-05-19 DIAGNOSIS — G2581 Restless legs syndrome: Secondary | ICD-10-CM | POA: Diagnosis present

## 2014-05-19 DIAGNOSIS — K219 Gastro-esophageal reflux disease without esophagitis: Secondary | ICD-10-CM | POA: Diagnosis present

## 2014-05-19 DIAGNOSIS — Z01818 Encounter for other preprocedural examination: Secondary | ICD-10-CM

## 2014-05-19 DIAGNOSIS — M48061 Spinal stenosis, lumbar region without neurogenic claudication: Principal | ICD-10-CM | POA: Diagnosis present

## 2014-05-19 DIAGNOSIS — Q762 Congenital spondylolisthesis: Secondary | ICD-10-CM | POA: Diagnosis not present

## 2014-05-19 DIAGNOSIS — E785 Hyperlipidemia, unspecified: Secondary | ICD-10-CM | POA: Diagnosis present

## 2014-05-19 DIAGNOSIS — E039 Hypothyroidism, unspecified: Secondary | ICD-10-CM | POA: Diagnosis present

## 2014-05-19 DIAGNOSIS — Z87891 Personal history of nicotine dependence: Secondary | ICD-10-CM

## 2014-05-19 SURGERY — POSTERIOR LUMBAR FUSION 2 LEVEL
Anesthesia: General | Site: Spine Lumbar

## 2014-05-19 MED ORDER — PANTOPRAZOLE SODIUM 40 MG IV SOLR
40.0000 mg | Freq: Every day | INTRAVENOUS | Status: DC
Start: 2014-05-19 — End: 2014-05-20
  Administered 2014-05-19: 40 mg via INTRAVENOUS
  Filled 2014-05-19 (×2): qty 40

## 2014-05-19 MED ORDER — FENTANYL CITRATE 0.05 MG/ML IJ SOLN
INTRAMUSCULAR | Status: AC
  Filled 2014-05-19: qty 5

## 2014-05-19 MED ORDER — ROCURONIUM BROMIDE 100 MG/10ML IV SOLN
INTRAVENOUS | Status: DC | PRN
  Administered 2014-05-19: 50 mg via INTRAVENOUS

## 2014-05-19 MED ORDER — METHOCARBAMOL 1000 MG/10ML IJ SOLN
500.0000 mg | Freq: Four times a day (QID) | INTRAVENOUS | Status: DC | PRN
  Filled 2014-05-19: qty 5

## 2014-05-19 MED ORDER — ACETAMINOPHEN 325 MG PO TABS: 650.0000 mg | ORAL_TABLET | ORAL | Status: DC | PRN

## 2014-05-19 MED ORDER — GABAPENTIN 100 MG PO CAPS: 300.0000 mg | ORAL_CAPSULE | Freq: Three times a day (TID) | ORAL | Status: DC

## 2014-05-19 MED ORDER — VANCOMYCIN HCL 1000 MG IV SOLR
INTRAVENOUS | Status: AC
Start: 2014-05-19 — End: 2014-05-20
  Filled 2014-05-19: qty 1000

## 2014-05-19 MED ORDER — BETHANECHOL CHLORIDE 25 MG PO TABS
50.0000 mg | ORAL_TABLET | Freq: Two times a day (BID) | ORAL | Status: DC
  Administered 2014-05-19 – 2014-05-22 (×6): 50 mg via ORAL
  Filled 2014-05-19 (×6): qty 2

## 2014-05-19 MED ORDER — LACTATED RINGERS IV SOLN
INTRAVENOUS | Status: DC
  Administered 2014-05-19: 09:00:00 via INTRAVENOUS

## 2014-05-19 MED ORDER — ROPINIROLE HCL 1 MG PO TABS: 1.0000 mg | ORAL_TABLET | Freq: Three times a day (TID) | ORAL | Status: DC | PRN

## 2014-05-19 MED ORDER — LIDOCAINE HCL (CARDIAC) 20 MG/ML IV SOLN
INTRAVENOUS | Status: AC
  Filled 2014-05-19: qty 10

## 2014-05-19 MED ORDER — DEXTROSE 5 % IV SOLN
INTRAVENOUS | Status: DC | PRN
Start: 2014-05-19 — End: 2014-05-19
  Administered 2014-05-19 (×2): via INTRAVENOUS

## 2014-05-19 MED ORDER — ALBUMIN HUMAN 5 % IV SOLN
INTRAVENOUS | Status: DC | PRN
  Administered 2014-05-19: 15:00:00 via INTRAVENOUS

## 2014-05-19 MED ORDER — SODIUM CHLORIDE 0.9 % IJ SOLN: 3.0000 mL | INTRAMUSCULAR | Status: DC | PRN

## 2014-05-19 MED ORDER — SODIUM CHLORIDE 0.9 % IR SOLN
Status: DC | PRN
  Administered 2014-05-19: 12:00:00

## 2014-05-19 MED ORDER — METHOCARBAMOL 500 MG PO TABS
500.0000 mg | ORAL_TABLET | Freq: Four times a day (QID) | ORAL | Status: DC | PRN
  Administered 2014-05-19 – 2014-05-22 (×4): 500 mg via ORAL
  Filled 2014-05-19 (×3): qty 1

## 2014-05-19 MED ORDER — HYDROMORPHONE HCL PF 1 MG/ML IJ SOLN
0.2500 mg | INTRAMUSCULAR | Status: DC | PRN
  Administered 2014-05-19 (×4): 0.5 mg via INTRAVENOUS

## 2014-05-19 MED ORDER — DOCUSATE SODIUM 100 MG PO CAPS
100.0000 mg | ORAL_CAPSULE | Freq: Two times a day (BID) | ORAL | Status: DC
  Administered 2014-05-19 – 2014-05-22 (×6): 100 mg via ORAL
  Filled 2014-05-19 (×6): qty 1

## 2014-05-19 MED ORDER — OXYCODONE-ACETAMINOPHEN 5-325 MG PO TABS
1.0000 | ORAL_TABLET | ORAL | Status: DC | PRN
  Administered 2014-05-19: 2 via ORAL
  Administered 2014-05-19 (×2): 1 via ORAL
  Administered 2014-05-20 (×4): 2 via ORAL
  Administered 2014-05-21 (×2): 1 via ORAL
  Administered 2014-05-21 (×2): 2 via ORAL
  Administered 2014-05-21 – 2014-05-22 (×3): 1 via ORAL
  Administered 2014-05-22: 2 via ORAL
  Administered 2014-05-22: 1 via ORAL
  Filled 2014-05-19 (×3): qty 2
  Filled 2014-05-19 (×2): qty 1
  Filled 2014-05-19 (×2): qty 2
  Filled 2014-05-19: qty 1
  Filled 2014-05-19 (×2): qty 2
  Filled 2014-05-19 (×2): qty 1
  Filled 2014-05-19 (×3): qty 2

## 2014-05-19 MED ORDER — NEOSTIGMINE METHYLSULFATE 10 MG/10ML IV SOLN
INTRAVENOUS | Status: AC
  Filled 2014-05-19: qty 2

## 2014-05-19 MED ORDER — KCL IN DEXTROSE-NACL 20-5-0.45 MEQ/L-%-% IV SOLN
80.0000 mL/h | INTRAVENOUS | Status: DC
  Administered 2014-05-20: 80 mL/h via INTRAVENOUS
  Filled 2014-05-19 (×3): qty 1000

## 2014-05-19 MED ORDER — ACETAMINOPHEN 650 MG RE SUPP: 650.0000 mg | RECTAL | Status: DC | PRN

## 2014-05-19 MED ORDER — GLYCOPYRROLATE 0.2 MG/ML IJ SOLN
INTRAMUSCULAR | Status: DC | PRN
  Administered 2014-05-19: 0.6 mg via INTRAVENOUS

## 2014-05-19 MED ORDER — ONDANSETRON HCL 4 MG/2ML IJ SOLN: 4.0000 mg | INTRAMUSCULAR | Status: DC | PRN

## 2014-05-19 MED ORDER — LEVOTHYROXINE SODIUM 50 MCG PO TABS
50.0000 ug | ORAL_TABLET | Freq: Every day | ORAL | Status: DC
  Administered 2014-05-20 – 2014-05-22 (×3): 50 ug via ORAL
  Filled 2014-05-19 (×3): qty 1

## 2014-05-19 MED ORDER — FENTANYL CITRATE 0.05 MG/ML IJ SOLN
INTRAMUSCULAR | Status: DC | PRN
  Administered 2014-05-19 (×2): 50 ug via INTRAVENOUS
  Administered 2014-05-19 (×2): 125 ug via INTRAVENOUS

## 2014-05-19 MED ORDER — HYDROMORPHONE HCL PF 1 MG/ML IJ SOLN
INTRAMUSCULAR | Status: AC
  Filled 2014-05-19: qty 1

## 2014-05-19 MED ORDER — CEFAZOLIN SODIUM-DEXTROSE 2-3 GM-% IV SOLR
INTRAVENOUS | Status: AC
  Filled 2014-05-19: qty 50

## 2014-05-19 MED ORDER — ROCURONIUM BROMIDE 50 MG/5ML IV SOLN
INTRAVENOUS | Status: AC
  Filled 2014-05-19: qty 1

## 2014-05-19 MED ORDER — VECURONIUM BROMIDE 10 MG IV SOLR
INTRAVENOUS | Status: AC
  Filled 2014-05-19: qty 10

## 2014-05-19 MED ORDER — GLYCOPYRROLATE 0.2 MG/ML IJ SOLN
INTRAMUSCULAR | Status: AC
  Filled 2014-05-19: qty 3

## 2014-05-19 MED ORDER — LIDOCAINE HCL (CARDIAC) 20 MG/ML IV SOLN
INTRAVENOUS | Status: DC | PRN
  Administered 2014-05-19 (×2): 50 mg via INTRAVENOUS

## 2014-05-19 MED ORDER — SODIUM CHLORIDE 0.9 % IJ SOLN
3.0000 mL | Freq: Two times a day (BID) | INTRAMUSCULAR | Status: DC
  Administered 2014-05-19 – 2014-05-22 (×5): 3 mL via INTRAVENOUS

## 2014-05-19 MED ORDER — VECURONIUM BROMIDE 10 MG IV SOLR
INTRAVENOUS | Status: DC | PRN
  Administered 2014-05-19: 1 mg via INTRAVENOUS

## 2014-05-19 MED ORDER — LIDOCAINE HCL (CARDIAC) 20 MG/ML IV SOLN
INTRAVENOUS | Status: AC
  Filled 2014-05-19: qty 5

## 2014-05-19 MED ORDER — OXYCODONE-ACETAMINOPHEN 5-325 MG PO TABS
ORAL_TABLET | ORAL | Status: AC
  Filled 2014-05-19: qty 2

## 2014-05-19 MED ORDER — PROPOFOL 10 MG/ML IV BOLUS
INTRAVENOUS | Status: DC | PRN
  Administered 2014-05-19: 150 mg via INTRAVENOUS

## 2014-05-19 MED ORDER — 0.9 % SODIUM CHLORIDE (POUR BTL) OPTIME
TOPICAL | Status: DC | PRN
  Administered 2014-05-19: 1000 mL

## 2014-05-19 MED ORDER — ALUM & MAG HYDROXIDE-SIMETH 200-200-20 MG/5ML PO SUSP: 30.0000 mL | Freq: Four times a day (QID) | ORAL | Status: DC | PRN

## 2014-05-19 MED ORDER — PHENYLEPHRINE HCL 10 MG/ML IJ SOLN
10.0000 mg | INTRAVENOUS | Status: DC | PRN
  Administered 2014-05-19: 40 ug/min via INTRAVENOUS

## 2014-05-19 MED ORDER — MIDAZOLAM HCL 2 MG/2ML IJ SOLN
INTRAMUSCULAR | Status: AC
  Filled 2014-05-19: qty 2

## 2014-05-19 MED ORDER — CEFAZOLIN SODIUM 1-5 GM-% IV SOLN
1.0000 g | Freq: Three times a day (TID) | INTRAVENOUS | Status: AC
  Administered 2014-05-19 – 2014-05-20 (×2): 1 g via INTRAVENOUS
  Filled 2014-05-19 (×2): qty 50

## 2014-05-19 MED ORDER — LACTATED RINGERS IV SOLN
INTRAVENOUS | Status: DC | PRN
  Administered 2014-05-19 (×3): via INTRAVENOUS

## 2014-05-19 MED ORDER — ONDANSETRON HCL 4 MG/2ML IJ SOLN
INTRAMUSCULAR | Status: DC | PRN
  Administered 2014-05-19: 4 mg via INTRAVENOUS

## 2014-05-19 MED ORDER — CARVEDILOL 6.25 MG PO TABS
6.2500 mg | ORAL_TABLET | Freq: Two times a day (BID) | ORAL | Status: DC
  Administered 2014-05-19 – 2014-05-22 (×4): 6.25 mg via ORAL
  Filled 2014-05-19 (×6): qty 1

## 2014-05-19 MED ORDER — SODIUM CHLORIDE 0.9 % IV SOLN: 250.0000 mL | INTRAVENOUS | Status: DC

## 2014-05-19 MED ORDER — GABAPENTIN 300 MG PO CAPS
300.0000 mg | ORAL_CAPSULE | Freq: Three times a day (TID) | ORAL | Status: DC
  Administered 2014-05-19 – 2014-05-22 (×8): 300 mg via ORAL
  Filled 2014-05-19 (×3): qty 1
  Filled 2014-05-19: qty 3
  Filled 2014-05-19 (×4): qty 1

## 2014-05-19 MED ORDER — FENTANYL CITRATE 0.05 MG/ML IJ SOLN
1500.0000 ug | INTRAMUSCULAR | Status: DC | PRN
  Administered 2014-05-19: 3 ug/kg/h via INTRAVENOUS

## 2014-05-19 MED ORDER — PHENOL 1.4 % MT LIQD: 1.0000 | OROMUCOSAL | Status: DC | PRN

## 2014-05-19 MED ORDER — THROMBIN 20000 UNITS EX SOLR
CUTANEOUS | Status: DC | PRN
  Administered 2014-05-19 (×2): via TOPICAL

## 2014-05-19 MED ORDER — AZELASTINE HCL 0.1 % NA SOLN
1.0000 | Freq: Two times a day (BID) | NASAL | Status: DC
  Administered 2014-05-19 – 2014-05-22 (×6): 1 via NASAL
  Filled 2014-05-19: qty 30

## 2014-05-19 MED ORDER — MENTHOL 3 MG MT LOZG: 1.0000 | LOZENGE | OROMUCOSAL | Status: DC | PRN

## 2014-05-19 MED ORDER — METHOCARBAMOL 500 MG PO TABS
ORAL_TABLET | ORAL | Status: AC
  Filled 2014-05-19: qty 1

## 2014-05-19 MED ORDER — VECURONIUM BROMIDE 10 MG IV SOLR
50.0000 mg | INTRAVENOUS | Status: DC | PRN
  Administered 2014-05-19: .05 mg/kg/h via INTRAVENOUS

## 2014-05-19 MED ORDER — VANCOMYCIN HCL 1000 MG IV SOLR
INTRAVENOUS | Status: DC | PRN
  Administered 2014-05-19: 1000 mg via TOPICAL

## 2014-05-19 MED ORDER — ALBUMIN HUMAN 5 % IV SOLN: INTRAVENOUS | Status: DC | PRN

## 2014-05-19 MED ORDER — NEOSTIGMINE METHYLSULFATE 10 MG/10ML IV SOLN
INTRAVENOUS | Status: DC | PRN
  Administered 2014-05-19: 5 mg via INTRAVENOUS

## 2014-05-19 MED ORDER — HYDROMORPHONE HCL PF 1 MG/ML IJ SOLN
1.0000 mg | INTRAMUSCULAR | Status: DC | PRN
  Administered 2014-05-22: 1 mg via INTRAMUSCULAR
  Filled 2014-05-19: qty 1

## 2014-05-19 MED ORDER — ONDANSETRON HCL 4 MG/2ML IJ SOLN: 4.0000 mg | Freq: Once | INTRAMUSCULAR | Status: DC | PRN

## 2014-05-19 MED ORDER — BUPIVACAINE HCL (PF) 0.5 % IJ SOLN
INTRAMUSCULAR | Status: DC | PRN
  Administered 2014-05-19: 20 mL

## 2014-05-19 MED ORDER — PROPOFOL 10 MG/ML IV BOLUS
INTRAVENOUS | Status: AC
  Filled 2014-05-19: qty 20

## 2014-05-19 SURGICAL SUPPLY — 81 items
ADH SKN CLS APL DERMABOND .7 (GAUZE/BANDAGES/DRESSINGS)
APL SKNCLS STERI-STRIP NONHPOA (GAUZE/BANDAGES/DRESSINGS) ×2
BAG DECANTER FOR FLEXI CONT (MISCELLANEOUS) ×3 IMPLANT
BENZOIN TINCTURE PRP APPL 2/3 (GAUZE/BANDAGES/DRESSINGS) ×6 IMPLANT
BLADE 10 SAFETY STRL DISP (BLADE) ×1 IMPLANT
BLADE SURG ROTATE 9660 (MISCELLANEOUS) ×1 IMPLANT
BONE EQUIVA 10CC (Bone Implant) ×2 IMPLANT
BRUSH SCRUB EZ PLAIN DRY (MISCELLANEOUS) ×3 IMPLANT
BUR CUTTER 7.0 ROUND (BURR) ×3 IMPLANT
BUR MATCHSTICK NEURO 3.0 LAGG (BURR) ×3 IMPLANT
CAGE PEEK OPTIMA 11X9X22 SPINE (Cage) ×6 IMPLANT
CANISTER SUCT 3000ML (MISCELLANEOUS) ×3 IMPLANT
CANNULATED PEDIGUARD #2 ×1 IMPLANT
CLOSURE WOUND 1/2 X4 (GAUZE/BANDAGES/DRESSINGS) ×2
CONT SPEC 4OZ CLIKSEAL STRL BL (MISCELLANEOUS) ×6 IMPLANT
COVER BACK TABLE 24X17X13 BIG (DRAPES) IMPLANT
COVER TABLE BACK 60X90 (DRAPES) ×3 IMPLANT
DERMABOND ADVANCED (GAUZE/BANDAGES/DRESSINGS)
DERMABOND ADVANCED .7 DNX12 (GAUZE/BANDAGES/DRESSINGS) IMPLANT
DRAPE C-ARM 42X72 X-RAY (DRAPES) ×4 IMPLANT
DRAPE C-ARMOR (DRAPES) ×3 IMPLANT
DRAPE LAPAROTOMY 100X72X124 (DRAPES) ×3 IMPLANT
DRAPE SURG 17X23 STRL (DRAPES) ×6 IMPLANT
DRSG OPSITE POSTOP 4X6 (GAUZE/BANDAGES/DRESSINGS) ×1 IMPLANT
DRSG OPSITE POSTOP 4X8 (GAUZE/BANDAGES/DRESSINGS) ×2 IMPLANT
DRSG TELFA 3X8 NADH (GAUZE/BANDAGES/DRESSINGS) IMPLANT
DURAPREP 26ML APPLICATOR (WOUND CARE) ×3 IMPLANT
ELECT REM PT RETURN 9FT ADLT (ELECTROSURGICAL) ×3
ELECTRODE REM PT RTRN 9FT ADLT (ELECTROSURGICAL) ×1 IMPLANT
EVACUATOR 1/8 PVC DRAIN (DRAIN) ×3 IMPLANT
GAUZE SPONGE 4X4 16PLY XRAY LF (GAUZE/BANDAGES/DRESSINGS) IMPLANT
GLOVE BIO SURGEON STRL SZ 6.5 (GLOVE) ×2 IMPLANT
GLOVE BIO SURGEONS STRL SZ 6.5 (GLOVE) ×2
GLOVE BIOGEL PI IND STRL 6.5 (GLOVE) ×1 IMPLANT
GLOVE BIOGEL PI IND STRL 7.0 (GLOVE) IMPLANT
GLOVE BIOGEL PI IND STRL 7.5 (GLOVE) IMPLANT
GLOVE BIOGEL PI INDICATOR 6.5 (GLOVE) ×2
GLOVE BIOGEL PI INDICATOR 7.0 (GLOVE) ×4
GLOVE BIOGEL PI INDICATOR 7.5 (GLOVE) ×4
GLOVE ECLIPSE 7.0 STRL STRAW (GLOVE) ×2 IMPLANT
GLOVE ECLIPSE 8.0 STRL XLNG CF (GLOVE) ×6 IMPLANT
GLOVE SURG SS PI 7.0 STRL IVOR (GLOVE) ×4 IMPLANT
GOWN STRL REUS W/ TWL LRG LVL3 (GOWN DISPOSABLE) IMPLANT
GOWN STRL REUS W/ TWL XL LVL3 (GOWN DISPOSABLE) ×2 IMPLANT
GOWN STRL REUS W/TWL 2XL LVL3 (GOWN DISPOSABLE) IMPLANT
GOWN STRL REUS W/TWL LRG LVL3 (GOWN DISPOSABLE) ×9
GOWN STRL REUS W/TWL XL LVL3 (GOWN DISPOSABLE) ×6
HANDLE PEDIGUARD CANNULATED (INSTRUMENTS) ×2 IMPLANT
IMPLANT ARDIS PEEK 8X9X22 (Orthopedic Implant) ×4 IMPLANT
K-WIRE NITHNOL TROCAR TIP (WIRE) ×14 IMPLANT
KIT BASIN OR (CUSTOM PROCEDURE TRAY) ×3 IMPLANT
KIT ROOM TURNOVER OR (KITS) ×3 IMPLANT
NEEDLE 1 PEDIGUARD CANNULATED (NEEDLE) ×4 IMPLANT
NEEDLE 2 PEDIGUARD CANNULATED (NEEDLE) ×2 IMPLANT
NEEDLE HYPO 22GX1.5 SAFETY (NEEDLE) ×3 IMPLANT
NS IRRIG 1000ML POUR BTL (IV SOLUTION) ×3 IMPLANT
PACK LAMINECTOMY NEURO (CUSTOM PROCEDURE TRAY) ×3 IMPLANT
PAD ARMBOARD 7.5X6 YLW CONV (MISCELLANEOUS) ×15 IMPLANT
PAD DRESSING TELFA 3X8 NADH (GAUZE/BANDAGES/DRESSINGS) ×1 IMPLANT
PATTIES SURGICAL .75X.75 (GAUZE/BANDAGES/DRESSINGS) ×1 IMPLANT
PEDICLE ACCESS TOOL SHEATH ×2 IMPLANT
ROD PERC 55MM LUMBAR (Rod) ×4 IMPLANT
SCREW MIN INVASIVE 6.5X35 (Screw) ×4 IMPLANT
SCREW POLYAXIA MIS 6.5X40MM (Screw) ×12 IMPLANT
SHEATH PAT (SHEATH) ×2 IMPLANT
SPONGE GAUZE 4X4 12PLY (GAUZE/BANDAGES/DRESSINGS) ×3 IMPLANT
SPONGE LAP 4X18 X RAY DECT (DISPOSABLE) IMPLANT
SPONGE SURGIFOAM ABS GEL 100 (HEMOSTASIS) ×3 IMPLANT
STRIP CLOSURE SKIN 1/2X4 (GAUZE/BANDAGES/DRESSINGS) ×4 IMPLANT
SUT PROLENE 0 CT 1 30 (SUTURE) ×3 IMPLANT
SUT VIC AB 0 CT1 18XCR BRD8 (SUTURE) ×2 IMPLANT
SUT VIC AB 0 CT1 8-18 (SUTURE) ×6
SUT VIC AB 2-0 OS6 18 (SUTURE) ×11 IMPLANT
SUT VIC AB 3-0 CP2 18 (SUTURE) ×3 IMPLANT
SYR 20ML ECCENTRIC (SYRINGE) ×3 IMPLANT
TOP CLSR SEQUOIA (Orthopedic Implant) ×12 IMPLANT
TOWEL OR 17X24 6PK STRL BLUE (TOWEL DISPOSABLE) ×3 IMPLANT
TOWEL OR 17X26 10 PK STRL BLUE (TOWEL DISPOSABLE) ×3 IMPLANT
TRAP SPECIMEN MUCOUS 40CC (MISCELLANEOUS) IMPLANT
TRAY FOLEY CATH 14FRSI W/METER (CATHETERS) ×3 IMPLANT
WATER STERILE IRR 1000ML POUR (IV SOLUTION) ×3 IMPLANT

## 2014-05-19 NOTE — Anesthesia Procedure Notes (Signed)
Procedure Name: Intubation Date/Time: 05/19/2014 10:49 AM Performed by: Marinda Elk A Pre-anesthesia Checklist: Patient identified, Timeout performed, Emergency Drugs available, Suction available and Patient being monitored Patient Re-evaluated:Patient Re-evaluated prior to inductionOxygen Delivery Method: Circle system utilized Preoxygenation: Pre-oxygenation with 100% oxygen Intubation Type: IV induction Ventilation: Mask ventilation without difficulty Laryngoscope Size: Mac and 3 Grade View: Grade I Tube type: Oral Tube size: 7.5 mm Number of attempts: 1 Airway Equipment and Method: Stylet Secured at: 20 cm Tube secured with: Tape Dental Injury: Teeth and Oropharynx as per pre-operative assessment

## 2014-05-19 NOTE — Op Note (Signed)
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 Midline incision was made above the spinous processes of L4-L5 and S1. Using Bovie cutting current the incision was carried on the dorsal lumbar fascia which was separated from the overlying soft tissue. We then did a subperiosteal dissection along the spinous processes lamina of L4-L5 and S1 bilaterally and self-retaining tract was placed for exposure. S/P showed approach the appropriate levels. Using Leksell rongeur spinous processes of L4-L5 and S1 removed. Starting the patient's left side generous foraminotomies were performed removing the inferior 80% of the L4 lamina the medial half of the facet joint the entire left L5 lamina and the superior one third of the S1 segment. Residual bone was removed and saved for use in the case and inflated was removed in a piecemeal fashion. Similar decompression was then carried on the opposite side in the midline structures were removed to complete the bilateral decompressive laminectomy. L 5 and S1 nerve roots well visualized well decompressed. We then entered the disc bilaterally at L4-5 and L5-S1 did thorough cleanout preparation for interbody fusion. Thorough displaced cleanout was carried out while the same time great care was taken to avoid injury to the neurolysis was successfully done. We distracted the disc spaces open and settled on 11 mm height at L4-5 and an 8 mm height at L5-S1. We thoroughly. Interspaces for interbody fusion and  placed peek interbody spacers. At L4-5 we used 11 x 9 x 22 mm cages and then at L5-S1 8 x 9 x 22 mm cages. These were followed into good position under fluoroscopy. We irrigated copiously controlled any bleeding. We decorticated the facet joints were for posterolateral fusion with a mixture of autologous bone morselized allograft. We then closed the midline dorsal fascia to percutaneous pedicle screws in standard fashion bilaterally. We passed a Jamshidi needle with ultrasonic guidance through the fascia and the pedicle in standard fashion. If office in AP lateral fluoroscopy as well as with the ultrasound 80 Jamshidi needle. We then placed wires connected the fascial opening. We tapped with the 6.2 mm tap and placed 6.5 x 40 mm screws at L4 and L5 bilaterally 6.5 x 35 mm screws at S1 bilaterally. We then chose appropriate length rods and passive down through the [place] and then secured them to the top of the screws with top loading nuts. We did tightening and final tightening with torque and counter torque then removed the [employer]. Final fluoroscopy in AP lateral direction looked excellent. We irrigated copiously controlled any bleeding in poor coagulation. We closed the fascial openings for the screws with interrupted 0 Vicryl and then closed the wound with Vicryl on the subcutaneous and subcuticular tissues. A running locking Prolene was placed on the skin. Shortness was then applied the patient was extubated and taken to recovery room in stable condition.

## 2014-05-19 NOTE — Anesthesia Preprocedure Evaluation (Addendum)
Anesthesia Evaluation  Patient identified by MRN, date of birth, ID band Patient awake    Reviewed: Allergy & Precautions, H&P , NPO status , Patient's Chart, lab work & pertinent test results, reviewed documented beta blocker date and time   Airway Mallampati: I TM Distance: >3 FB Neck ROM: Full    Dental  (+) Edentulous Upper, Teeth Intact   Pulmonary COPDformer smoker,          Cardiovascular + dysrhythmias     Neuro/Psych    GI/Hepatic   Endo/Other  Hypothyroidism   Renal/GU      Musculoskeletal   Abdominal   Peds  Hematology   Anesthesia Other Findings   Reproductive/Obstetrics                          Anesthesia Physical Anesthesia Plan  ASA: II  Anesthesia Plan: General   Post-op Pain Management:    Induction: Intravenous  Airway Management Planned: Oral ETT  Additional Equipment:   Intra-op Plan:   Post-operative Plan: Extubation in OR  Informed Consent: I have reviewed the patients History and Physical, chart, labs and discussed the procedure including the risks, benefits and alternatives for the proposed anesthesia with the patient or authorized representative who has indicated his/her understanding and acceptance.     Plan Discussed with: CRNA, Anesthesiologist and Surgeon  Anesthesia Plan Comments:         Anesthesia Quick Evaluation

## 2014-05-19 NOTE — Transfer of Care (Signed)
Immediate Anesthesia Transfer of Care Note  Patient: [name]  Procedure(s) Performed: Procedure(s): POSTERIOR LUMBAR FOUR-FIVE/ LUMBAR FIVE- SACRAL ONE INTERBODY AND FUSION WITH PERCUTANEOUS PEDICLE SCREWS (N/A)  Patient Location: PACU  Anesthesia Type:General  Level of Consciousness: oriented, sedated, patient cooperative and responds to stimulation  Airway & Oxygen Therapy: Patient Spontanous Breathing and Patient connected to nasal cannula oxygen  Post-op Assessment: Report given to PACU RN, Post -op Vital signs reviewed and stable, Patient moving all extremities and Patient moving all extremities X 4  Post vital signs: Reviewed and stable  Complications: No apparent anesthesia complications

## 2014-05-19 NOTE — H&P (Signed)
[name] is an 68 y.o. female.   Chief Complaint: Back and bilateral leg pain HPI: The patient is a 67 year old female who was initially seen back in 2013 complaint of back and bilateral leg pain. She was noted that time significant lumbar disease that shows work with pain management for a few years. This did not give her significant improvement she returned to a chair back in June of this year complained of severe pain her back which returned her buttocks and legs bilaterally. After evaluation the office the patient underwent imaging studies. The showed severe stenosis at L4-5 as well as stenosis and listhesis at L5-S1. After discussing the options the patient requested surgery now she comes for a two-level posterior lumbar interbody fusion with pedicle screw fixation. I had a long discussion with her regarding the risks and benefits of surgical intervention. The risks discussed include but are not limited to bleeding infection weakness numbness paralysis trouble with his mentation nonunion coma hoarseness and death. We have discussed alternative methods of therapy although risks and benefits of nonintervention. She's had the opportunity to ask numerous questions and appears to understand. With this information in hand she has requested we proceed with surgery.  Past Medical History  Diagnosis Date  . Hip pain   . Back pain   . GERD (gastroesophageal reflux disease)   . Hyperthyroidism   . Arthritis   . Restless leg syndrome   . Ex-smoker Quit 15 years ago    Smoked 1 PPD x 20 years  . Syncope   . Hyperlipidemia     statin intolerant  . LV dysfunction     ejection fraction 40-45% by 2-D echocardiogram  . Dysrhythmia     bradycardia    Past Surgical History  Procedure Laterality Date  . Right rotator cuff repair    . Esophageal dilation      X 2  . Bladder suspension      Family History  Problem Relation Age of Onset  . Adopted: Yes   Social History:  reports that she quit  smoking about 20 years ago. Her smoking use included Cigarettes. She smoked 0.00 packs per day. She has never used smokeless tobacco. She reports that she drinks alcohol. She reports that she does not use illicit drugs.  Allergies: No Known Allergies  Medications Prior to Admission  Medication Sig Dispense Refill  . Alpha-Lipoic Acid 100 MG CAPS Take 1 capsule by mouth daily.      Marland Kitchen azelastine (ASTELIN) 137 MCG/SPRAY nasal spray Place 1 spray into the nose 2 (two) times daily. Use in each nostril as directed      . b complex vitamins tablet Take 2 tablets by mouth daily.      . bethanechol (URECHOLINE) 50 MG tablet Take 50 mg by mouth 2 (two) times daily.       . carvedilol (COREG) 6.25 MG tablet Take 1 tablet (6.25 mg total) by mouth 2 (two) times daily.  180 tablet  3  . cholecalciferol (VITAMIN D) 1000 UNITS tablet Take 2,000 Units by mouth daily.       . Coenzyme Q10 (COQ-10) 100 MG CAPS Take 200 mg by mouth every evening.       . cyclobenzaprine (FLEXERIL) 10 MG tablet Take 10 mg by mouth 2 (two) times daily.       . fluticasone (FLONASE) 50 MCG/ACT nasal spray Place 2 sprays into the nose daily.      Marland Kitchen gabapentin (NEURONTIN) 100 MG capsule  Take 100 mg by mouth 3 (three) times daily. 200 mg every morning and at noon, and 300 mg every evening      . HYDROcodone-acetaminophen (NORCO/VICODIN) 5-325 MG per tablet Take 1 tablet by mouth 3 (three) times daily.       Marland Kitchen levothyroxine (SYNTHROID, LEVOTHROID) 50 MCG tablet Take 50 mcg by mouth daily.      . Melatonin 5 MG TABS Take 5 mg by mouth daily.      . Methotrexate Sodium, PF, 50 MG/2ML SOLN Inject 60 mg as directed once a week.       . Multiple Vitamin (MULTIVITAMIN WITH MINERALS) TABS Take 1 tablet by mouth daily.      Marland Kitchen omeprazole (PRILOSEC) 40 MG capsule Take 40 mg by mouth every evening.      Marland Kitchen rOPINIRole (REQUIP) 1 MG tablet Take 1 mg by mouth 3 (three) times daily as needed. Restless leg      . temazepam (RESTORIL) 30 MG capsule Take  30 mg by mouth at bedtime.        No results found for this or any previous visit (from the past 48 hour(s)). No results found.  Positive for weight gain some visual changes and cold intolerance  Blood pressure 128/61, pulse 94, temperature 98 F (36.7 C), temperature source Oral, resp. rate 20, weight 49.896 kg (110 lb), SpO2 100.00%.  The patient is awake or and oriented. His no facial asymmetry. Her gait is mildly antalgic. Strength is normal reflexes are decreased but equal Assessment/Plan Impression is that of two-level stenosis with spondylolisthesis. The plan is for a two-level decompression with interbody fusion and instrumentation.  Faythe Ghee, MD 05/19/2014, 10:14 AM

## 2014-05-20 MED ORDER — PNEUMOCOCCAL VAC POLYVALENT 25 MCG/0.5ML IJ INJ
0.5000 mL | INJECTION | INTRAMUSCULAR | Status: DC
  Filled 2014-05-20 (×2): qty 0.5

## 2014-05-20 MED ORDER — PANTOPRAZOLE SODIUM 40 MG PO TBEC
40.0000 mg | DELAYED_RELEASE_TABLET | Freq: Every day | ORAL | Status: DC
  Administered 2014-05-20 – 2014-05-22 (×3): 40 mg via ORAL
  Filled 2014-05-20 (×3): qty 1

## 2014-05-20 NOTE — Progress Notes (Signed)
Patient ambulated the hall this morning with front wheel walker, back brace, and 1 standby assist.  She ambulated around 300 ft.  Patient tolerated the walk very well.  She denied any dizziness, or any high rate of pain.  She felt very comfortable walking as per patient.

## 2014-05-20 NOTE — Progress Notes (Signed)
Patient ID: [name], female   DOB: 1946-08-17, 68 y.o.   MRN: [id number] BP 92/50  Pulse 93  Temp(Src) 98.8 F (37.1 C) (Oral)  Resp 18  Wt 49.896 kg (110 lb)  SpO2 99% Alert and oriented x 4, speech is clear and fluent Wound dressing stained, dry Normal strength in lower extremities Doing very well, continue pt, ot

## 2014-05-21 NOTE — Progress Notes (Signed)
No issues overnight. Pt has some back soreness, no leg pain. Ambulating well. Voiding normally.   EXAM:  BP 99/48  Pulse 96  Temp(Src) 98.2 F (36.8 C) (Oral)  Resp 18  Wt 49.896 kg (110 lb)  SpO2 100%  Awake, alert, oriented  5/5 BUE/BLE  Wound dry, minimal blood staining  IMPRESSION:  68 y.o. female POD# 2 s/p PLIF, doing well  PLAN: - Cont to mobilize - Likely home tomorrow

## 2014-05-22 MED ORDER — OXYCODONE-ACETAMINOPHEN 5-325 MG PO TABS: 1.0000 | ORAL_TABLET | ORAL | Status: DC | PRN

## 2014-05-22 NOTE — Progress Notes (Signed)
Pt denies her pneumonia vaccine stating she already had that and her measles vaccine. Francis Gaines Hyatt Capobianco RN.

## 2014-05-22 NOTE — Anesthesia Postprocedure Evaluation (Signed)
  Anesthesia Post-op Note  Patient: [name]  Procedure(s) Performed: Procedure(s): POSTERIOR LUMBAR FOUR-FIVE/ LUMBAR FIVE- SACRAL ONE INTERBODY AND FUSION WITH PERCUTANEOUS PEDICLE SCREWS (N/A)  Patient Location: PACU  Anesthesia Type:General  Level of Consciousness: awake, alert , oriented and patient cooperative  Airway and Oxygen Therapy: Patient Spontanous Breathing  Post-op Pain: moderate  Post-op Assessment: Post-op Vital signs reviewed, Patient's Cardiovascular Status Stable, Respiratory Function Stable, Patent Airway and No signs of Nausea or vomiting  Post-op Vital Signs: stable  Last Vitals:  Filed Vitals:   05/22/14 0611  BP: 99/45  Pulse: 90  Temp: 36.9 C  Resp: 18    Complications: No apparent anesthesia complications

## 2014-05-22 NOTE — Progress Notes (Signed)
IM given to [name] [phone]

## 2014-05-22 NOTE — Progress Notes (Signed)
Pt discharge education and instructions completed with pt and spouse at bedside. Both voices understanding and denies any questions. Pt IV removed; back incision dsg removed by MD. Site open to air with surface sutures intact. No s/s infection noted or reported. Pt handed her prescription for percocet; pt transported off unit via wheelchair by volunteer; pt transported off with belongings and spouse at side. Pt discharge home with spouse to transport off to disposition. Francis Gaines Saidy Ormand RN.

## 2014-05-22 NOTE — Discharge Summary (Signed)
Physician Discharge Summary  Patient ID: [name] MRN: [id number] DOB/AGE: 1946/04/05 68 y.o.  Admit date: 05/19/2014 Discharge date: 05/22/2014  Admission Diagnoses:  Discharge Diagnoses:  Active Problems:   Spinal stenosis of lumbar region at multiple levels   Discharged Condition: good  Hospital Course: Surgery 3 days ago for 2 level plif. Did well. Marked improvement in pre op pain. Increased activity well. By pod 3, ambulating well, wound healing well. Home with specific instructions.  Consults: None  Significant Diagnostic Studies: none  Treatments: surgery: L 45 L5S1 plif  Discharge Exam: Blood pressure 99/45, pulse 90, temperature 98.5 F (36.9 C), temperature source Oral, resp. rate 18, weight 49.896 kg (110 lb), SpO2 100.00%. Incision/Wound:clean and dry; no new neuro issues  Disposition: 01-Home or Self Care     Medication List    ASK your doctor about these medications       Alpha-Lipoic Acid 100 MG Caps  Take 1 capsule by mouth daily.     azelastine 0.1 % nasal spray  Commonly known as:  ASTELIN  Place 1 spray into the nose 2 (two) times daily. Use in each nostril as directed     b complex vitamins tablet  Take 2 tablets by mouth daily.     bethanechol 50 MG tablet  Commonly known as:  URECHOLINE  Take 50 mg by mouth 2 (two) times daily.     carvedilol 6.25 MG tablet  Commonly known as:  COREG  Take 1 tablet (6.25 mg total) by mouth 2 (two) times daily.     cholecalciferol 1000 UNITS tablet  Commonly known as:  VITAMIN D  Take 2,000 Units by mouth daily.     CoQ-10 100 MG Caps  Take 200 mg by mouth every evening.     cyclobenzaprine 10 MG tablet  Commonly known as:  FLEXERIL  Take 10 mg by mouth 2 (two) times daily.     fluticasone 50 MCG/ACT nasal spray  Commonly known as:  FLONASE  Place 2 sprays into the nose daily.     gabapentin 100 MG capsule  Commonly known as:  NEURONTIN  Take 300 mg by mouth 3 (three) times daily.     HYDROcodone-acetaminophen 5-325 MG per tablet  Commonly known as:  NORCO/VICODIN  Take 1 tablet by mouth 3 (three) times daily.     levothyroxine 50 MCG tablet  Commonly known as:  SYNTHROID, LEVOTHROID  Take 50 mcg by mouth daily.     Melatonin 5 MG Tabs  Take 5 mg by mouth daily.     Methotrexate Sodium (PF) 50 MG/2ML Soln  Inject 60 mg as directed once a week.     multivitamin with minerals Tabs tablet  Take 1 tablet by mouth daily.     omeprazole 40 MG capsule  Commonly known as:  PRILOSEC  Take 40 mg by mouth every evening.     rOPINIRole 1 MG tablet  Commonly known as:  REQUIP  Take 1 mg by mouth 3 (three) times daily as needed. Restless leg     temazepam 30 MG capsule  Commonly known as:  RESTORIL  Take 30 mg by mouth at bedtime.         At home rest most of the time. Get up 9 or 10 times each day and take a 15 or 20 minute walk. No riding in the car and to your first postoperative appointment. If you have neck surgery you may shower from the chest down starting on the  third postoperative day. If you had back surgery he may start showering on the third postoperative day with saran wrap wrapped around your incisional area 3 times. After the shower remove the saran wrap. Take pain medicine as needed and other medications as instructed. Call my office for an appointment.  SignedFaythe Ghee, MD 05/22/2014, 8:56 AM

## 2014-09-19 ENCOUNTER — Other Ambulatory Visit: Payer: Self-pay | Admitting: Cardiovascular Disease

## 2014-09-20 NOTE — Telephone Encounter (Signed)
Rx refill sent to patient pharmacy   

## 2015-01-04 ENCOUNTER — Telehealth: Payer: Self-pay | Admitting: Cardiovascular Disease

## 2015-01-04 NOTE — Telephone Encounter (Signed)
Pt called to c/o fatigue, trouble getting rest at night, heart "fluttering" intermittently. Has had sharp pain, 1-2 seconds in duration, behind the ears. Symptoms have happened over a period of several weeks, more noticeable for the past 2 weeks.  She states most concerned about the fluttering sensation. Not associated w/ any dyspnea or symptoms other than mentioned.  Recommended PCP f/u for sleep issues. Scheduled for PA OV w/ Luke for 3/29. Told to call for any changes or other concerns.

## 2015-01-04 NOTE — Telephone Encounter (Signed)
Have her see mid-level provider

## 2015-01-04 NOTE — Telephone Encounter (Signed)
Mrs. [name] is calling because he has been having some sharp pain in her neck under her ears, having trouble sleeping and heart is fluttering . Please Call .   Thanks

## 2015-01-18 ENCOUNTER — Telehealth: Payer: Self-pay | Admitting: Cardiology

## 2015-01-18 ENCOUNTER — Encounter: Payer: Self-pay | Admitting: Cardiovascular Disease

## 2015-01-18 NOTE — Telephone Encounter (Signed)
Received records from [facility] for appointment with Kerin Ransom, [place] on 01/23/15.  Records given to [employer] (medical records) for Luke's schedule on 01/23/15. lp

## 2015-01-23 ENCOUNTER — Telehealth: Payer: Self-pay | Admitting: Cardiology

## 2015-01-23 ENCOUNTER — Ambulatory Visit (INDEPENDENT_AMBULATORY_CARE_PROVIDER_SITE_OTHER): Payer: Medicare Other | Admitting: Cardiology

## 2015-01-23 ENCOUNTER — Encounter: Payer: Self-pay | Admitting: Cardiology

## 2015-01-23 VITALS — BP 130/70 | HR 73 | Ht 60.0 in | Wt 119.3 lb

## 2015-01-23 DIAGNOSIS — I10 Essential (primary) hypertension: Secondary | ICD-10-CM

## 2015-01-23 DIAGNOSIS — R5383 Other fatigue: Secondary | ICD-10-CM

## 2015-01-23 DIAGNOSIS — R002 Palpitations: Secondary | ICD-10-CM | POA: Diagnosis not present

## 2015-01-23 DIAGNOSIS — E039 Hypothyroidism, unspecified: Secondary | ICD-10-CM | POA: Diagnosis not present

## 2015-01-23 LAB — TSH: TSH: 0.983 u[IU]/mL (ref 0.350–4.500)

## 2015-01-23 MED ORDER — CARVEDILOL 6.25 MG PO TABS: 6.2500 mg | ORAL_TABLET | Freq: Two times a day (BID) | ORAL | Status: DC

## 2015-01-23 MED ORDER — NEBIVOLOL HCL 2.5 MG PO TABS: 2.5000 mg | ORAL_TABLET | Freq: Every day | ORAL | Status: DC

## 2015-01-23 NOTE — Telephone Encounter (Signed)
Left message to call back  Per luke , restart coreg 6.25 mg take in the evening instead of morning dose Patient indicated to [facility] she only takes coreg once a day instead of twice a day Do not take Bystolic  due to the cost.

## 2015-01-23 NOTE — Addendum Note (Signed)
Addended by: Vear Clock on: 01/23/2015 10:08 AM   Modules accepted: Orders

## 2015-01-23 NOTE — Telephone Encounter (Signed)
She should take er Coreg once a day in the evening  Kerin Ransom PA-C 01/23/2015 12:47 PM

## 2015-01-23 NOTE — Telephone Encounter (Signed)
Forward to [employer] PA

## 2015-01-23 NOTE — Assessment & Plan Note (Signed)
Complaining of fatigue and some DOE

## 2015-01-23 NOTE — Telephone Encounter (Signed)
Pt just saw [facility] this morning. He prescribed Lenetta Quaker says this is going to be too [employer] will not cover it.Marland Kitchen She would for him  to prescribe something else please.

## 2015-01-23 NOTE — Patient Instructions (Addendum)
STOP Coreg START Bystolic 2.5 every night  Your physician recommends that you have lab work drawn Today  Your physician recommends that you schedule a follow-up appointment in: 4 weeks with Dr.Berry

## 2015-01-23 NOTE — Assessment & Plan Note (Signed)
Pt having palpitations at night x 2 months

## 2015-01-23 NOTE — Telephone Encounter (Signed)
PATIENT NOTIFIED  SHE VERBALIZED UNDERSTANDING

## 2015-01-23 NOTE — Progress Notes (Signed)
01/23/2015 [name]   [birth year]  [id number]  Primary Physician DOUGH,ROBERT, MD Primary Cardiologist: Dr Gwenlyn Found  HPI:  69 y/o female, seen by Dr Gwenlyn Found in 2014 after a syncopal episode. She had seen a neurologist and as part of her work up had an echo that was abnormal- EF 45%. She had a negative event monitor Oct 2014. A F/u echo in July 2015 showed her EF to be 50-55%. She is in the office today with complaints of palpitations at night as well as fatigue and dyspnea. She denies chest pain. She admits she drinks coffee till 3 pm "then switches to decaf".  She also stopped her PM dose of Coreg.    Current Outpatient Prescriptions  Medication Sig Dispense Refill  . Alpha-Lipoic Acid 100 MG CAPS Take 1 capsule by mouth daily.    Marland Kitchen azelastine (ASTELIN) 137 MCG/SPRAY nasal spray Place 1 spray into the nose 2 (two) times daily. Use in each nostril as directed    . b complex vitamins tablet Take 2 tablets by mouth daily.    . bethanechol (URECHOLINE) 50 MG tablet Take 50 mg by mouth daily.     . cholecalciferol (VITAMIN D) 1000 UNITS tablet Take 2,000 Units by mouth daily.     . Coenzyme Q10 (COQ-10) 100 MG CAPS Take 200 mg by mouth every evening.     . cyclobenzaprine (FLEXERIL) 10 MG tablet Take 10 mg by mouth 2 (two) times daily.     . fluticasone (FLONASE) 50 MCG/ACT nasal spray Place 2 sprays into the nose daily.    Marland Kitchen levothyroxine (SYNTHROID, LEVOTHROID) 50 MCG tablet Take 50 mcg by mouth daily.    . Melatonin 5 MG TABS Take 5 mg by mouth daily.    . Multiple Vitamin (MULTIVITAMIN WITH MINERALS) TABS Take 1 tablet by mouth daily.    Marland Kitchen omeprazole (PRILOSEC) 40 MG capsule Take 40 mg by mouth every evening.    Marland Kitchen rOPINIRole (REQUIP) 1 MG tablet Take 1 mg by mouth 3 (three) times daily as needed. Restless leg    . temazepam (RESTORIL) 30 MG capsule Take 30 mg by mouth at bedtime.    . nebivolol (BYSTOLIC) 2.5 MG tablet Take 1 tablet (2.5 mg total) by mouth at bedtime. 30 tablet 0   No  current facility-administered medications for this visit.    No Known Allergies  History   Social History  . Marital Status: Married    Spouse Name: [name]  . Number of Children: 2  . Years of Education: 13   Occupational History  . Driver Musician     Part time    Social History Main Topics  . Smoking status: Former Smoker    Types: Cigarettes    Quit date: 08/25/1993  . Smokeless tobacco: Never Used     Comment: Quit about 20 years before.  . Alcohol Use: 0.0 oz/week     Comment: occasionally  . Drug Use: No  . Sexual Activity: No   Other Topics Concern  . Not on file   Social History Narrative   Patient lives with her husband. ([name]) - [place]   She is an adopted kid and so is unaware of her family history.   She has 2 children- one has asthma and other has arthritis.                 Review of Systems: General: negative for chills, fever, night sweats or weight changes.  Cardiovascular: negative  for chest pain, dyspnea on exertion, edema, orthopnea, palpitations, paroxysmal nocturnal dyspnea or shortness of breath Dermatological: negative for rash Respiratory: negative for cough or wheezing Urologic: negative for hematuria Abdominal: negative for nausea, vomiting, diarrhea, bright red blood per rectum, melena, or hematemesis Neurologic: negative for visual changes, syncope, or dizziness All other systems reviewed and are otherwise negative except as noted above.    Blood pressure 130/70, pulse 73, height 5' (1.524 m), weight 119 lb 4.8 oz (54.114 kg).  General appearance: alert, cooperative, no distress and anxious Neck: no carotid bruit and no JVD Lungs: clear to auscultation bilaterally Heart: regular rate and rhythm Extremities: no edema, venous varicosities  EKG NSR  ASSESSMENT AND PLAN:   Palpitations Pt having palpitations at night x 2 months   Fatigue Complaining of fatigue and some DOE    PLAN  I have suggested a 48 hr monitor.  I also suggested she stop caffine altogether. Because of her fatigue I ordered a TSH, the last one in our system from 2008 was abnormal. I also changed her to Bystolic 2.5 mg Q HS. She'll f/u with Dr Gwenlyn Found in a few weeks.  Kalimah Capurro KPA-C 01/23/2015 9:27 AM

## 2015-01-25 ENCOUNTER — Telehealth: Payer: Self-pay | Admitting: Cardiology

## 2015-01-25 NOTE — Telephone Encounter (Signed)
Pt wants lab results from 01-23-15 please.

## 2015-01-25 NOTE — Telephone Encounter (Signed)
Returned call to patient 01/23/15 TSH normal,continue same medication.

## 2015-01-30 ENCOUNTER — Telehealth: Payer: Self-pay | Admitting: *Deleted

## 2015-01-30 NOTE — Telephone Encounter (Signed)
Monitor reviewed by dr Stanford Breed shows sinus with pvc's.  pt aware of results

## 2015-02-21 ENCOUNTER — Ambulatory Visit (INDEPENDENT_AMBULATORY_CARE_PROVIDER_SITE_OTHER): Payer: Medicare Other | Admitting: Cardiovascular Disease

## 2015-02-21 ENCOUNTER — Encounter: Payer: Self-pay | Admitting: Cardiovascular Disease

## 2015-02-21 VITALS — BP 132/60 | HR 70 | Ht 60.0 in | Wt 121.3 lb

## 2015-02-21 DIAGNOSIS — R002 Palpitations: Secondary | ICD-10-CM

## 2015-02-21 NOTE — Patient Instructions (Signed)
Make sure that you are taking your Carvedilol 6.25mg  TWICE DAILY  Please STOP your caffeine intake    Your physician recommends that you schedule a follow-up appointment in:3 months with Kerin Ransom PA  Your physician recommends that you schedule a follow-up appointment in:6 months with Dr.Berry

## 2015-02-21 NOTE — Assessment & Plan Note (Signed)
Recent Holter monitor revealed sinus rhythm with occasional unifocal PVCs. She does admit to drinking caffeinated beverages during the first half of the day and to not taking her carvedilol twice a day. Her TSH was normal. We discussed taking medications as prescribed and discontinuing caffeinated beverages.

## 2015-02-21 NOTE — Progress Notes (Signed)
[name] returns here for follow-up of her Holter monitor ordered because of palpitations. She had sinus rhythm with PVCs that were unifocal and fairly infrequent. She does admit to drinking caffeine beverages and taking her carvedilol once a day instead of twice a day as prescribed. We talked about making changes in this and she will see looked back in 3 months for follow-up.   [name], M.D., [place], [facility], [place] [address] [place], [place]  [address]  [phone] 02/21/2015 12:10 PM

## 2015-04-23 ENCOUNTER — Other Ambulatory Visit: Payer: Self-pay

## 2015-06-26 IMAGING — DX DG CHEST 1V PORT
1 series · 1 of 1 positions shown · non-contrast
Comparison: 06/21/2013

CLINICAL DATA: Line placement

EXAM:
PORTABLE CHEST - 1 VIEW

[AP]
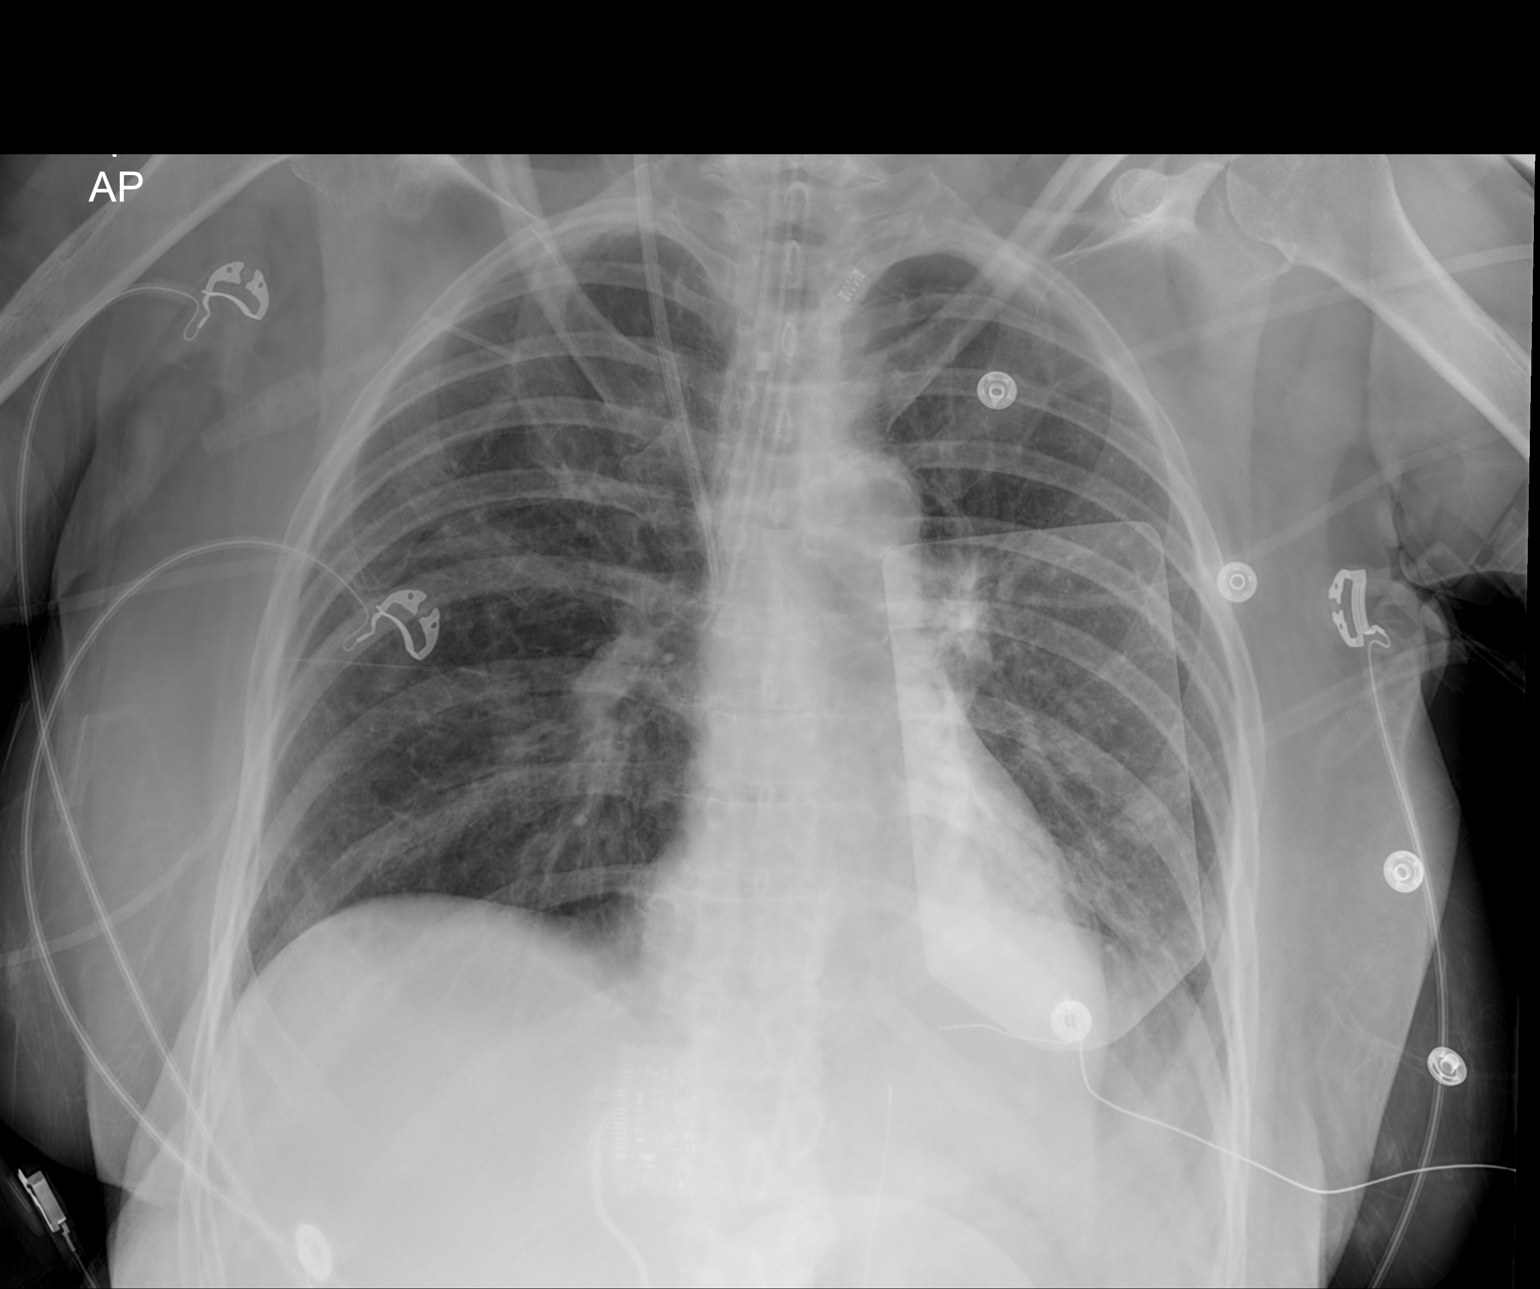

[1 of 1 positions shown; findings below may reference images not displayed]

FINDINGS: Endotracheal tube is within the right mainstem bronchus and should
be retracted approximately 3-4 cm. Right central line tip in the
SVC. No pneumothorax. Slight increase left basilar opacity, likely
atelectasis. Right lung is clear. No visible effusions.
IMPRESSION: Right mainstem intubation. Recommend retracting endotracheal tube
3-4 cm.

Right central line in the SVC. No pneumothorax.

Left base atelectasis.

These results will be called to the ordering clinician or
representative by the Radiologist Assistant, and communication
documented in the PACS Dashboard.

## 2015-06-26 IMAGING — CR DG CHEST 1V PORT
1 series · 1 of 1 positions shown · non-contrast
Comparison: 02/25/2005.

CLINICAL DATA: Bradycardia, nausea.

EXAM:
PORTABLE CHEST - 1 VIEW

[AP]
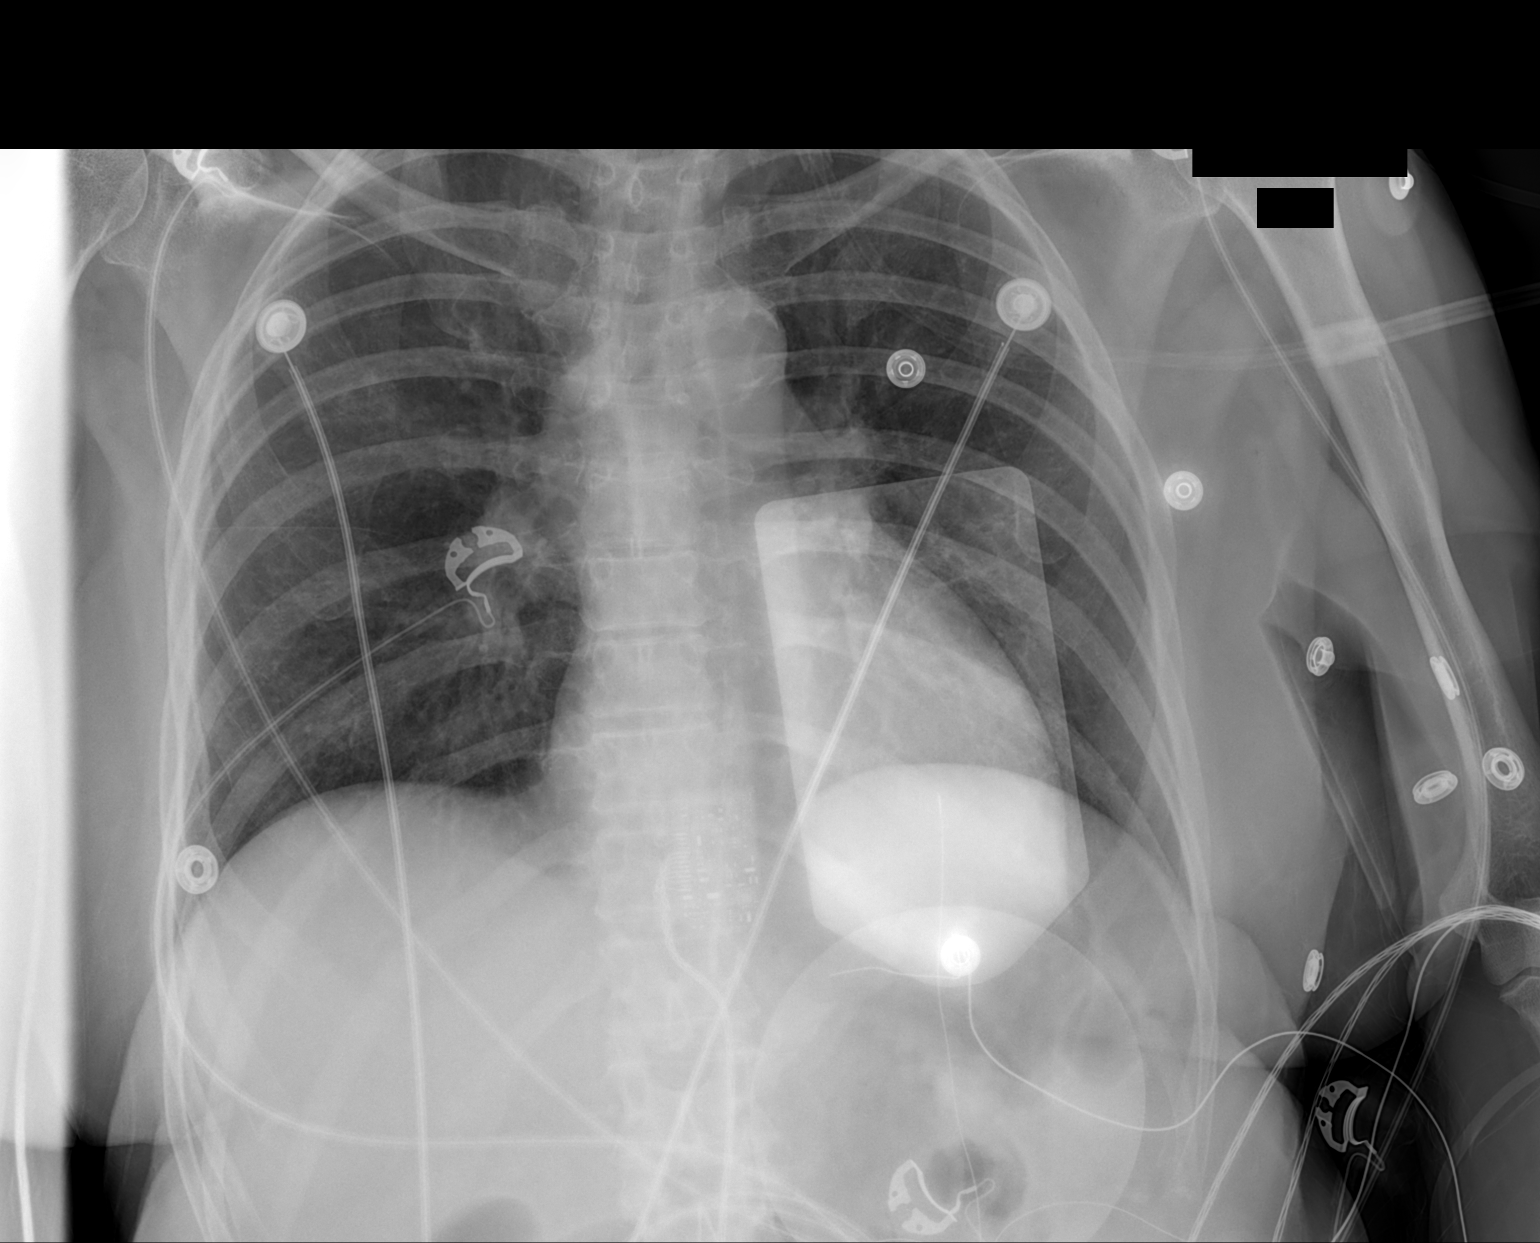

[1 of 1 positions shown; findings below may reference images not displayed]

FINDINGS: The heart size and mediastinal contours are within normal limits.
Both lungs are clear. The visualized skeletal structures are
unremarkable.
IMPRESSION: No active disease.

## 2015-06-27 IMAGING — DX DG CHEST 1V PORT
1 series · 1 of 1 positions shown · non-contrast
Comparison: Prior chest x-ray 06/21/2013

CLINICAL DATA: Evaluate lung fields, endotracheal tube position

PORTABLE CHEST - 1 VIEW

[portable]
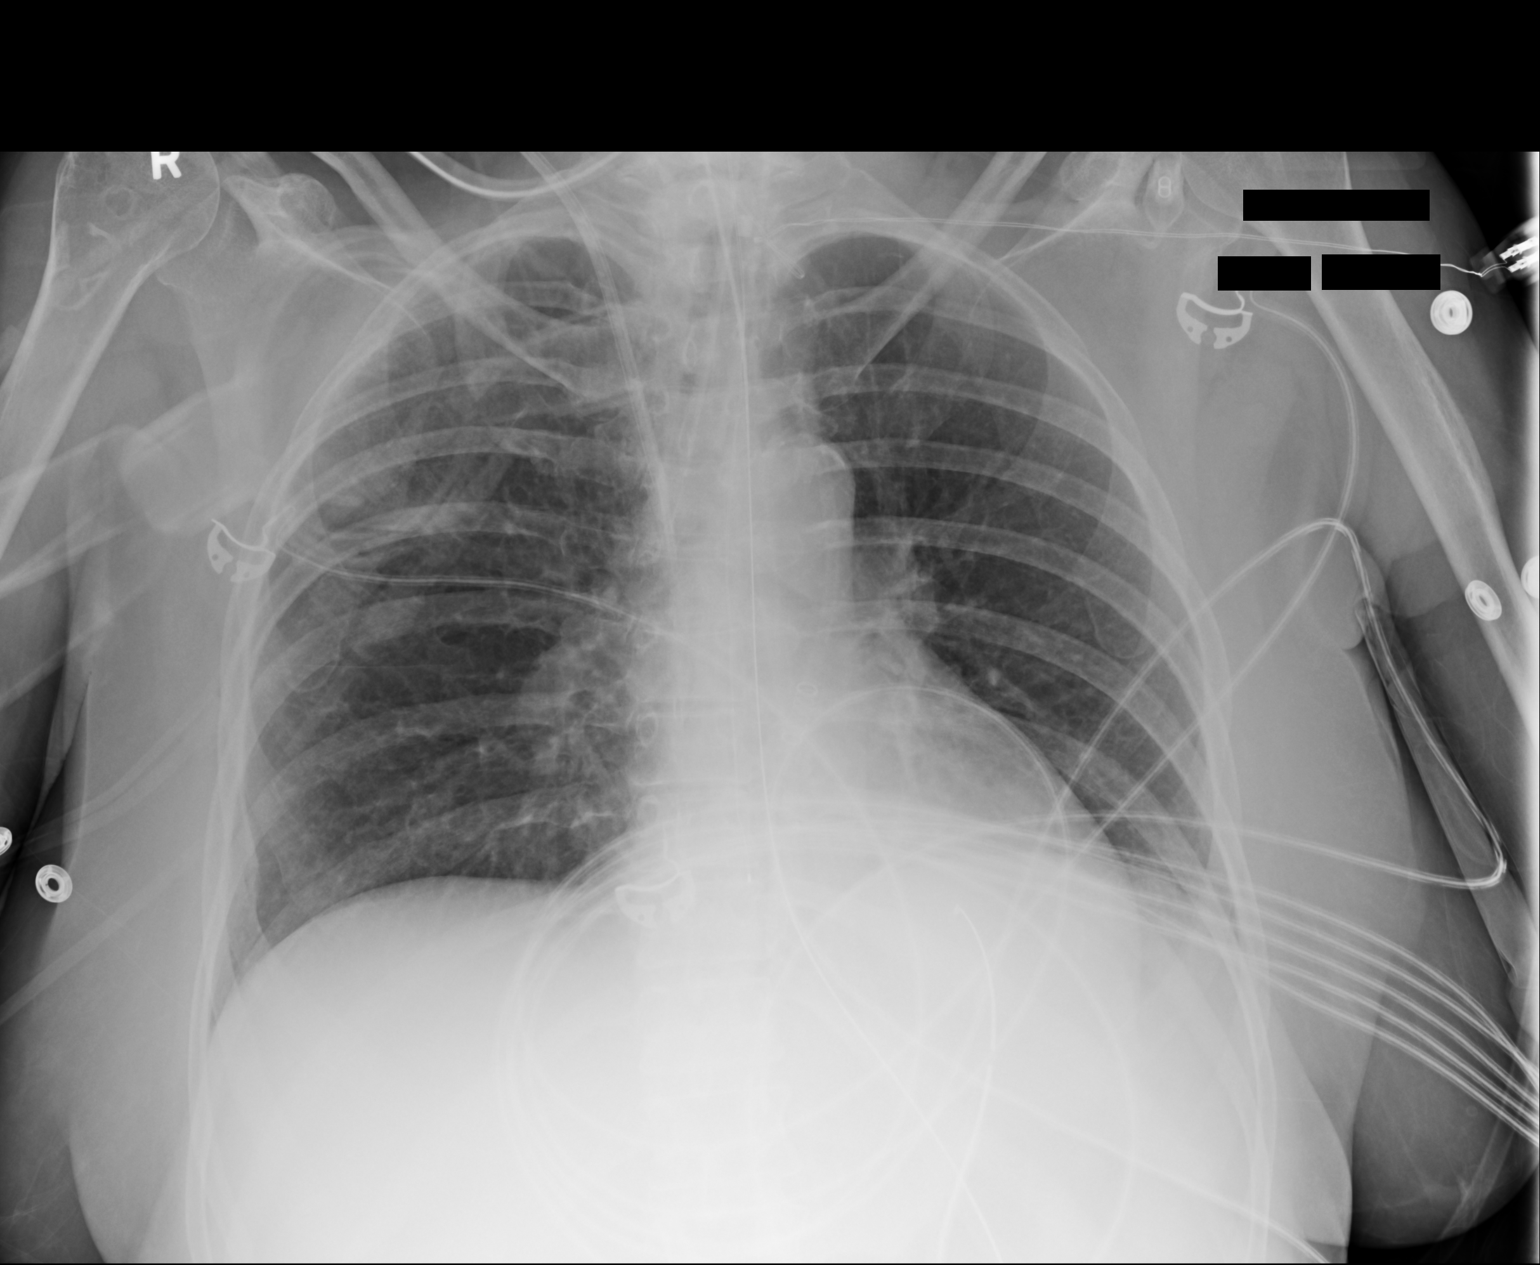

[1 of 1 positions shown; findings below may reference images not displayed]

FINDINGS: Although the endotracheal tube has will been withdrawn
slightly, the tip remains at the level of the carina and just
within the right mainstem bronchus.  Recommend withdrawing at 3 cm
for more optimal positioning.  Stable right IJ vascular sheath with
the tip in the mid SVC.  The nasogastric tube tip projects over the
gastric fundus in the left upper quadrant.  Nonspecific left
basilar opacity with some evidence of volume loss, fever
atelectasis.  No pulmonary edema.  Stable cardiac and mediastinal
contours.  Atherosclerotic calcifications noted in the transverse
aorta.  No acute osseous abnormality.
IMPRESSION: 1.  The tip of the endotracheal tube remains at the level of the
carina and is directed just into the right mainstem bronchus.
Recommend withdrawing 3 cm for more optimal positioning.

2.  Nasogastric tube has been placed.  The tip is within the
gastric fundus.

3.  Left basilar atelectasis versus infiltrate, atelectasis is
favored.

to the patient's nurse, Tiger, who verbally acknowledged these
results.

## 2015-07-12 ENCOUNTER — Other Ambulatory Visit: Payer: Self-pay | Admitting: Cardiovascular Disease

## 2015-07-12 NOTE — Telephone Encounter (Signed)
Rx(s) sent to pharmacy electronically.  

## 2015-08-03 DIAGNOSIS — Z85820 Personal history of malignant melanoma of skin: Secondary | ICD-10-CM | POA: Diagnosis not present

## 2015-11-15 DIAGNOSIS — R0600 Dyspnea, unspecified: Secondary | ICD-10-CM | POA: Diagnosis not present

## 2015-11-15 DIAGNOSIS — M0609 Rheumatoid arthritis without rheumatoid factor, multiple sites: Secondary | ICD-10-CM | POA: Diagnosis not present

## 2015-11-15 DIAGNOSIS — L659 Nonscarring hair loss, unspecified: Secondary | ICD-10-CM | POA: Diagnosis not present

## 2015-11-15 DIAGNOSIS — R0689 Other abnormalities of breathing: Secondary | ICD-10-CM | POA: Diagnosis not present

## 2015-11-15 DIAGNOSIS — I272 Other secondary pulmonary hypertension: Secondary | ICD-10-CM | POA: Diagnosis not present

## 2015-11-15 DIAGNOSIS — M150 Primary generalized (osteo)arthritis: Secondary | ICD-10-CM | POA: Diagnosis not present

## 2015-12-04 DIAGNOSIS — C4362 Malignant melanoma of left upper limb, including shoulder: Secondary | ICD-10-CM | POA: Diagnosis not present

## 2015-12-04 DIAGNOSIS — Z85820 Personal history of malignant melanoma of skin: Secondary | ICD-10-CM | POA: Diagnosis not present

## 2016-01-07 DIAGNOSIS — F5101 Primary insomnia: Secondary | ICD-10-CM | POA: Diagnosis not present

## 2016-01-07 DIAGNOSIS — G2581 Restless legs syndrome: Secondary | ICD-10-CM | POA: Diagnosis not present

## 2016-01-07 DIAGNOSIS — E039 Hypothyroidism, unspecified: Secondary | ICD-10-CM | POA: Diagnosis not present

## 2016-02-13 DIAGNOSIS — L819 Disorder of pigmentation, unspecified: Secondary | ICD-10-CM | POA: Diagnosis not present

## 2016-02-13 DIAGNOSIS — L659 Nonscarring hair loss, unspecified: Secondary | ICD-10-CM | POA: Diagnosis not present

## 2016-02-13 DIAGNOSIS — I272 Other secondary pulmonary hypertension: Secondary | ICD-10-CM | POA: Diagnosis not present

## 2016-02-13 DIAGNOSIS — M0609 Rheumatoid arthritis without rheumatoid factor, multiple sites: Secondary | ICD-10-CM | POA: Diagnosis not present

## 2016-02-13 DIAGNOSIS — M25551 Pain in right hip: Secondary | ICD-10-CM | POA: Diagnosis not present

## 2016-02-13 DIAGNOSIS — M79641 Pain in right hand: Secondary | ICD-10-CM | POA: Diagnosis not present

## 2016-02-13 DIAGNOSIS — M150 Primary generalized (osteo)arthritis: Secondary | ICD-10-CM | POA: Diagnosis not present

## 2016-02-13 DIAGNOSIS — M791 Myalgia: Secondary | ICD-10-CM | POA: Diagnosis not present

## 2016-02-13 DIAGNOSIS — M79642 Pain in left hand: Secondary | ICD-10-CM | POA: Diagnosis not present

## 2016-02-13 DIAGNOSIS — I7300 Raynaud's syndrome without gangrene: Secondary | ICD-10-CM | POA: Diagnosis not present

## 2016-02-13 DIAGNOSIS — R635 Abnormal weight gain: Secondary | ICD-10-CM | POA: Diagnosis not present

## 2016-02-26 DIAGNOSIS — L820 Inflamed seborrheic keratosis: Secondary | ICD-10-CM | POA: Diagnosis not present

## 2016-02-26 DIAGNOSIS — C4362 Malignant melanoma of left upper limb, including shoulder: Secondary | ICD-10-CM | POA: Diagnosis not present

## 2016-02-26 DIAGNOSIS — D225 Melanocytic nevi of trunk: Secondary | ICD-10-CM | POA: Diagnosis not present

## 2016-02-26 DIAGNOSIS — L821 Other seborrheic keratosis: Secondary | ICD-10-CM | POA: Diagnosis not present

## 2016-02-26 DIAGNOSIS — D2239 Melanocytic nevi of other parts of face: Secondary | ICD-10-CM | POA: Diagnosis not present

## 2016-03-20 ENCOUNTER — Other Ambulatory Visit: Payer: Self-pay | Admitting: Cardiovascular Disease

## 2016-03-20 NOTE — Telephone Encounter (Signed)
Rx refill sent to pharmacy. 

## 2016-05-05 DIAGNOSIS — Z85820 Personal history of malignant melanoma of skin: Secondary | ICD-10-CM | POA: Diagnosis not present

## 2016-05-05 DIAGNOSIS — C4362 Malignant melanoma of left upper limb, including shoulder: Secondary | ICD-10-CM | POA: Diagnosis not present

## 2016-05-05 DIAGNOSIS — D509 Iron deficiency anemia, unspecified: Secondary | ICD-10-CM | POA: Diagnosis not present

## 2016-05-08 DIAGNOSIS — M7662 Achilles tendinitis, left leg: Secondary | ICD-10-CM | POA: Diagnosis not present

## 2016-05-08 DIAGNOSIS — M722 Plantar fascial fibromatosis: Secondary | ICD-10-CM | POA: Diagnosis not present

## 2016-05-08 DIAGNOSIS — J309 Allergic rhinitis, unspecified: Secondary | ICD-10-CM | POA: Diagnosis not present

## 2016-05-13 DIAGNOSIS — M7662 Achilles tendinitis, left leg: Secondary | ICD-10-CM | POA: Diagnosis not present

## 2016-05-13 DIAGNOSIS — M722 Plantar fascial fibromatosis: Secondary | ICD-10-CM | POA: Diagnosis not present

## 2016-05-14 DIAGNOSIS — M150 Primary generalized (osteo)arthritis: Secondary | ICD-10-CM | POA: Diagnosis not present

## 2016-05-14 DIAGNOSIS — M0609 Rheumatoid arthritis without rheumatoid factor, multiple sites: Secondary | ICD-10-CM | POA: Diagnosis not present

## 2016-05-14 DIAGNOSIS — R635 Abnormal weight gain: Secondary | ICD-10-CM | POA: Diagnosis not present

## 2016-05-14 DIAGNOSIS — L819 Disorder of pigmentation, unspecified: Secondary | ICD-10-CM | POA: Diagnosis not present

## 2016-05-14 DIAGNOSIS — I272 Other secondary pulmonary hypertension: Secondary | ICD-10-CM | POA: Diagnosis not present

## 2016-05-14 DIAGNOSIS — L659 Nonscarring hair loss, unspecified: Secondary | ICD-10-CM | POA: Diagnosis not present

## 2016-05-14 DIAGNOSIS — M791 Myalgia: Secondary | ICD-10-CM | POA: Diagnosis not present

## 2016-05-14 DIAGNOSIS — I7300 Raynaud's syndrome without gangrene: Secondary | ICD-10-CM | POA: Diagnosis not present

## 2016-05-16 DIAGNOSIS — D509 Iron deficiency anemia, unspecified: Secondary | ICD-10-CM | POA: Diagnosis not present

## 2016-05-23 DIAGNOSIS — D509 Iron deficiency anemia, unspecified: Secondary | ICD-10-CM | POA: Diagnosis not present

## 2016-06-03 DIAGNOSIS — M7662 Achilles tendinitis, left leg: Secondary | ICD-10-CM | POA: Diagnosis not present

## 2016-06-03 DIAGNOSIS — M722 Plantar fascial fibromatosis: Secondary | ICD-10-CM | POA: Diagnosis not present

## 2016-06-17 ENCOUNTER — Other Ambulatory Visit: Payer: Self-pay | Admitting: Cardiovascular Disease

## 2016-06-17 NOTE — Telephone Encounter (Signed)
Pt overdue for follow up.

## 2016-06-24 DIAGNOSIS — M7662 Achilles tendinitis, left leg: Secondary | ICD-10-CM | POA: Diagnosis not present

## 2016-06-24 DIAGNOSIS — M722 Plantar fascial fibromatosis: Secondary | ICD-10-CM | POA: Diagnosis not present

## 2016-07-03 DIAGNOSIS — J42 Unspecified chronic bronchitis: Secondary | ICD-10-CM | POA: Diagnosis not present

## 2016-07-03 DIAGNOSIS — M7592 Shoulder lesion, unspecified, left shoulder: Secondary | ICD-10-CM | POA: Diagnosis not present

## 2016-07-03 DIAGNOSIS — J209 Acute bronchitis, unspecified: Secondary | ICD-10-CM | POA: Diagnosis not present

## 2016-07-07 DIAGNOSIS — K121 Other forms of stomatitis: Secondary | ICD-10-CM | POA: Diagnosis not present

## 2016-07-15 DIAGNOSIS — M791 Myalgia: Secondary | ICD-10-CM | POA: Diagnosis not present

## 2016-07-15 DIAGNOSIS — G8929 Other chronic pain: Secondary | ICD-10-CM | POA: Diagnosis not present

## 2016-07-15 DIAGNOSIS — M2550 Pain in unspecified joint: Secondary | ICD-10-CM | POA: Diagnosis not present

## 2016-07-15 DIAGNOSIS — M19012 Primary osteoarthritis, left shoulder: Secondary | ICD-10-CM | POA: Diagnosis not present

## 2016-07-15 DIAGNOSIS — M25512 Pain in left shoulder: Secondary | ICD-10-CM | POA: Diagnosis not present

## 2016-07-21 ENCOUNTER — Other Ambulatory Visit: Payer: Self-pay | Admitting: Cardiovascular Disease

## 2016-07-21 NOTE — Telephone Encounter (Signed)
Rx request sent to pharmacy.  

## 2016-07-31 DIAGNOSIS — M7662 Achilles tendinitis, left leg: Secondary | ICD-10-CM | POA: Diagnosis not present

## 2016-07-31 DIAGNOSIS — M6702 Short Achilles tendon (acquired), left ankle: Secondary | ICD-10-CM | POA: Diagnosis not present

## 2016-07-31 DIAGNOSIS — M722 Plantar fascial fibromatosis: Secondary | ICD-10-CM | POA: Diagnosis not present

## 2016-08-04 DIAGNOSIS — M25512 Pain in left shoulder: Secondary | ICD-10-CM | POA: Diagnosis not present

## 2016-08-14 DIAGNOSIS — R635 Abnormal weight gain: Secondary | ICD-10-CM | POA: Diagnosis not present

## 2016-08-14 DIAGNOSIS — M791 Myalgia: Secondary | ICD-10-CM | POA: Diagnosis not present

## 2016-08-14 DIAGNOSIS — M150 Primary generalized (osteo)arthritis: Secondary | ICD-10-CM | POA: Diagnosis not present

## 2016-08-14 DIAGNOSIS — L819 Disorder of pigmentation, unspecified: Secondary | ICD-10-CM | POA: Diagnosis not present

## 2016-08-14 DIAGNOSIS — M0609 Rheumatoid arthritis without rheumatoid factor, multiple sites: Secondary | ICD-10-CM | POA: Diagnosis not present

## 2016-08-14 DIAGNOSIS — I7300 Raynaud's syndrome without gangrene: Secondary | ICD-10-CM | POA: Diagnosis not present

## 2016-08-14 DIAGNOSIS — L659 Nonscarring hair loss, unspecified: Secondary | ICD-10-CM | POA: Diagnosis not present

## 2016-08-22 DIAGNOSIS — H5201 Hypermetropia, right eye: Secondary | ICD-10-CM | POA: Diagnosis not present

## 2016-08-22 DIAGNOSIS — Z79899 Other long term (current) drug therapy: Secondary | ICD-10-CM | POA: Diagnosis not present

## 2016-08-24 ENCOUNTER — Other Ambulatory Visit: Payer: Self-pay | Admitting: Cardiovascular Disease

## 2016-08-25 NOTE — Telephone Encounter (Signed)
Spoke with patient and she stated she had problems with the Carvedilol twice a day so doctor reduced her to once daily and she is getting along fine with that dose She does have follow up 09/30/16, discuss further at that time

## 2016-08-28 DIAGNOSIS — L820 Inflamed seborrheic keratosis: Secondary | ICD-10-CM | POA: Diagnosis not present

## 2016-08-28 DIAGNOSIS — L814 Other melanin hyperpigmentation: Secondary | ICD-10-CM | POA: Diagnosis not present

## 2016-08-28 DIAGNOSIS — D485 Neoplasm of uncertain behavior of skin: Secondary | ICD-10-CM | POA: Diagnosis not present

## 2016-08-28 DIAGNOSIS — D2239 Melanocytic nevi of other parts of face: Secondary | ICD-10-CM | POA: Diagnosis not present

## 2016-08-28 DIAGNOSIS — D225 Melanocytic nevi of trunk: Secondary | ICD-10-CM | POA: Diagnosis not present

## 2016-08-28 DIAGNOSIS — L821 Other seborrheic keratosis: Secondary | ICD-10-CM | POA: Diagnosis not present

## 2016-09-02 DIAGNOSIS — M6702 Short Achilles tendon (acquired), left ankle: Secondary | ICD-10-CM | POA: Diagnosis not present

## 2016-09-02 DIAGNOSIS — M7662 Achilles tendinitis, left leg: Secondary | ICD-10-CM | POA: Diagnosis not present

## 2016-09-02 DIAGNOSIS — M722 Plantar fascial fibromatosis: Secondary | ICD-10-CM | POA: Diagnosis not present

## 2016-09-05 DIAGNOSIS — Z0000 Encounter for general adult medical examination without abnormal findings: Secondary | ICD-10-CM | POA: Diagnosis not present

## 2016-09-05 DIAGNOSIS — Z23 Encounter for immunization: Secondary | ICD-10-CM | POA: Diagnosis not present

## 2016-09-30 ENCOUNTER — Ambulatory Visit: Payer: PPO | Admitting: Cardiovascular Disease

## 2016-10-01 ENCOUNTER — Encounter: Payer: Self-pay | Admitting: Cardiovascular Disease

## 2016-10-01 ENCOUNTER — Ambulatory Visit (INDEPENDENT_AMBULATORY_CARE_PROVIDER_SITE_OTHER): Payer: PPO | Admitting: Cardiovascular Disease

## 2016-10-01 VITALS — BP 138/82 | HR 101 | Ht 61.0 in | Wt 142.0 lb

## 2016-10-01 DIAGNOSIS — R002 Palpitations: Secondary | ICD-10-CM

## 2016-10-01 DIAGNOSIS — I519 Heart disease, unspecified: Secondary | ICD-10-CM | POA: Diagnosis not present

## 2016-10-01 NOTE — Assessment & Plan Note (Signed)
History of palpitations in the past probably related to PVCs currently controlled on beta blockade and limited caffeine intake.

## 2016-10-01 NOTE — Assessment & Plan Note (Signed)
History of moderate LV dysfunction with past with her most recent echo performed 04/26/14 revealed an EF of 50-55%.

## 2016-10-01 NOTE — Progress Notes (Signed)
10/01/2016 [name]   1946-06-15  [id number]  Primary Physician Wickliffe, MD Primary Cardiologist: Lorretta Harp MD Renae Gloss  HPI:  [name] is a 70 year old Caucasian female who I last saw in the office 02/21/15. She was referred because of palpitations. A Holter showed PVCs. She did admit to caffeine intake. 2-D echo prior to that showed an EF of 40-45% although most recently had increased 50-55%. Her symptoms improved with low-dose beta-blockade and decreasing caffeine intake. She is currently asymptomatic.   Current Outpatient Prescriptions  Medication Sig Dispense Refill  . Alpha-Lipoic Acid 150 MG CAPS Take 1 capsule by mouth daily.    Marland Kitchen azelastine (ASTELIN) 137 MCG/SPRAY nasal spray Place 1 spray into the nose 2 (two) times daily. Use in each nostril as directed    . b complex vitamins tablet Take 2 tablets by mouth daily.    . carvedilol (COREG) 6.25 MG tablet Take 6.25 mg by mouth 2 (two) times daily with a meal.    . cholecalciferol (VITAMIN D) 1000 UNITS tablet Take 2,000 Units by mouth daily.     . Coenzyme Q10 (COQ-10) 100 MG CAPS Take 200 mg by mouth every evening.     . cyclobenzaprine (FLEXERIL) 10 MG tablet Take 10 mg by mouth 2 (two) times daily.     . diclofenac sodium (VOLTAREN) 1 % GEL Apply 2 g topically as needed.    . fluticasone (FLONASE) 50 MCG/ACT nasal spray Place 2 sprays into the nose daily.    Marland Kitchen ketoconazole (NIZORAL) 2 % shampoo as needed.   0  . levothyroxine (SYNTHROID, LEVOTHROID) 50 MCG tablet Take 50 mcg by mouth daily.    . Melatonin 5 MG TABS Take 5 mg by mouth daily.    . Multiple Vitamin (MULTIVITAMIN WITH MINERALS) TABS Take 1 tablet by mouth daily.    Marland Kitchen omeprazole (PRILOSEC) 40 MG capsule Take 40 mg by mouth every evening.    Marland Kitchen rOPINIRole (REQUIP) 1 MG tablet Take 1 mg by mouth 3 (three) times daily as needed. Restless leg    . temazepam (RESTORIL) 30 MG capsule Take 30 mg by mouth at bedtime.     No current  facility-administered medications for this visit.     Allergies  Allergen Reactions  . Ciprofloxacin Nausea And Vomiting    UNKNOWN  . Statins     Other reaction(s): Other (See Comments) CRAMPS  . Sulfamethoxazole Swelling    Stomatitis, Angioedema    Social History   Social History  . Marital status: Married    Spouse name: [name]  . Number of children: 2  . Years of education: 13   Occupational History  . 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 Smoking status: Former Smoker    Types: Cigarettes    Quit date: 08/25/1993  . Smokeless tobacco: Never Used     Comment: Quit about 20 years before.  . Alcohol use 0.0 oz/week     Comment: occasionally  . Drug use: No  . Sexual activity: No   Other Topics Concern  . Not on file   Social History Narrative   Patient lives with her husband. ([name]) - [place]   She is an adopted kid and so is unaware of her family history.   She has 2 children- one has asthma and other has arthritis.  Review of Systems: General: negative for chills, fever, night sweats or weight changes.  Cardiovascular: negative for chest pain, dyspnea on exertion, edema, orthopnea, palpitations, paroxysmal nocturnal dyspnea or shortness of breath Dermatological: negative for rash Respiratory: negative for cough or wheezing Urologic: negative for hematuria Abdominal: negative for nausea, vomiting, diarrhea, bright red blood per rectum, melena, or hematemesis Neurologic: negative for visual changes, syncope, or dizziness All other systems reviewed and are otherwise negative except as noted above.    Blood pressure 138/82, pulse (!) 101, height 5\' 1"  (1.549 m), weight 142 lb (64.4 kg).  General appearance: alert and no distress Neck: no adenopathy, no carotid bruit, no JVD, supple, symmetrical, trachea midline and thyroid not enlarged, symmetric, no tenderness/mass/nodules Lungs: clear to  auscultation bilaterally Heart: regular rate and rhythm, S1, S2 normal, no murmur, click, rub or gallop Extremities: extremities normal, atraumatic, no cyanosis or edema  EKG sinus tachycardia 101 nonspecific ST and T-wave changes. I personally reviewed this EKG  ASSESSMENT AND PLAN:   LV dysfunction History of moderate LV dysfunction with past with her most recent echo performed 04/26/14 revealed an EF of 50-55%.  Palpitations History of palpitations in the past probably related to PVCs currently controlled on beta blockade and limited caffeine intake.      Lorretta Harp MD FACP,FACC,FAHA, [facility] 10/01/2016 11:03 AM

## 2016-10-01 NOTE — Patient Instructions (Signed)
Medication Instructions: Your physician recommends that you continue on your current medications as directed. Please refer to the Current Medication list given to you today.   Follow-Up: Your physician recommends that you schedule a follow-up appointment as needed with Dr. Berry.  If you need a refill on your cardiac medications before your next appointment, please call your pharmacy.  

## 2016-10-06 DIAGNOSIS — J441 Chronic obstructive pulmonary disease with (acute) exacerbation: Secondary | ICD-10-CM | POA: Diagnosis not present

## 2016-10-14 DIAGNOSIS — L659 Nonscarring hair loss, unspecified: Secondary | ICD-10-CM | POA: Diagnosis not present

## 2016-10-14 DIAGNOSIS — M0609 Rheumatoid arthritis without rheumatoid factor, multiple sites: Secondary | ICD-10-CM | POA: Diagnosis not present

## 2016-10-14 DIAGNOSIS — R635 Abnormal weight gain: Secondary | ICD-10-CM | POA: Diagnosis not present

## 2016-10-14 DIAGNOSIS — Z6827 Body mass index (BMI) 27.0-27.9, adult: Secondary | ICD-10-CM | POA: Diagnosis not present

## 2016-10-14 DIAGNOSIS — E663 Overweight: Secondary | ICD-10-CM | POA: Diagnosis not present

## 2016-10-14 DIAGNOSIS — R252 Cramp and spasm: Secondary | ICD-10-CM | POA: Diagnosis not present

## 2016-10-14 DIAGNOSIS — M150 Primary generalized (osteo)arthritis: Secondary | ICD-10-CM | POA: Diagnosis not present

## 2016-10-14 DIAGNOSIS — I7300 Raynaud's syndrome without gangrene: Secondary | ICD-10-CM | POA: Diagnosis not present

## 2016-10-14 DIAGNOSIS — M791 Myalgia: Secondary | ICD-10-CM | POA: Diagnosis not present

## 2016-10-21 ENCOUNTER — Other Ambulatory Visit: Payer: Self-pay | Admitting: Cardiovascular Disease

## 2016-10-21 NOTE — Telephone Encounter (Signed)
REFILL 

## 2016-11-04 DIAGNOSIS — L97521 Non-pressure chronic ulcer of other part of left foot limited to breakdown of skin: Secondary | ICD-10-CM | POA: Diagnosis not present

## 2016-11-04 DIAGNOSIS — M6702 Short Achilles tendon (acquired), left ankle: Secondary | ICD-10-CM | POA: Diagnosis not present

## 2016-11-04 DIAGNOSIS — M7662 Achilles tendinitis, left leg: Secondary | ICD-10-CM | POA: Diagnosis not present

## 2016-11-04 DIAGNOSIS — L03032 Cellulitis of left toe: Secondary | ICD-10-CM | POA: Diagnosis not present

## 2016-11-07 DIAGNOSIS — Z862 Personal history of diseases of the blood and blood-forming organs and certain disorders involving the immune mechanism: Secondary | ICD-10-CM | POA: Diagnosis not present

## 2016-11-07 DIAGNOSIS — Z85820 Personal history of malignant melanoma of skin: Secondary | ICD-10-CM | POA: Diagnosis not present

## 2016-11-07 DIAGNOSIS — C4362 Malignant melanoma of left upper limb, including shoulder: Secondary | ICD-10-CM | POA: Diagnosis not present

## 2016-11-11 DIAGNOSIS — J441 Chronic obstructive pulmonary disease with (acute) exacerbation: Secondary | ICD-10-CM | POA: Diagnosis not present

## 2016-11-11 DIAGNOSIS — L03032 Cellulitis of left toe: Secondary | ICD-10-CM | POA: Diagnosis not present

## 2016-11-11 DIAGNOSIS — R0602 Shortness of breath: Secondary | ICD-10-CM | POA: Diagnosis not present

## 2016-11-11 DIAGNOSIS — L97521 Non-pressure chronic ulcer of other part of left foot limited to breakdown of skin: Secondary | ICD-10-CM | POA: Diagnosis not present

## 2016-11-11 DIAGNOSIS — R05 Cough: Secondary | ICD-10-CM | POA: Diagnosis not present

## 2016-11-18 DIAGNOSIS — J441 Chronic obstructive pulmonary disease with (acute) exacerbation: Secondary | ICD-10-CM | POA: Diagnosis not present

## 2016-11-21 DIAGNOSIS — L03032 Cellulitis of left toe: Secondary | ICD-10-CM | POA: Diagnosis not present

## 2016-11-21 DIAGNOSIS — L97521 Non-pressure chronic ulcer of other part of left foot limited to breakdown of skin: Secondary | ICD-10-CM | POA: Diagnosis not present

## 2016-12-04 DIAGNOSIS — L97521 Non-pressure chronic ulcer of other part of left foot limited to breakdown of skin: Secondary | ICD-10-CM | POA: Diagnosis not present

## 2016-12-04 DIAGNOSIS — L03032 Cellulitis of left toe: Secondary | ICD-10-CM | POA: Diagnosis not present

## 2016-12-22 DIAGNOSIS — J181 Lobar pneumonia, unspecified organism: Secondary | ICD-10-CM | POA: Diagnosis not present

## 2016-12-22 DIAGNOSIS — R05 Cough: Secondary | ICD-10-CM | POA: Diagnosis not present

## 2017-01-13 DIAGNOSIS — M0609 Rheumatoid arthritis without rheumatoid factor, multiple sites: Secondary | ICD-10-CM | POA: Diagnosis not present

## 2017-01-13 DIAGNOSIS — M150 Primary generalized (osteo)arthritis: Secondary | ICD-10-CM | POA: Diagnosis not present

## 2017-01-13 DIAGNOSIS — J40 Bronchitis, not specified as acute or chronic: Secondary | ICD-10-CM | POA: Diagnosis not present

## 2017-01-13 DIAGNOSIS — I519 Heart disease, unspecified: Secondary | ICD-10-CM | POA: Diagnosis not present

## 2017-01-13 DIAGNOSIS — Z6823 Body mass index (BMI) 23.0-23.9, adult: Secondary | ICD-10-CM | POA: Diagnosis not present

## 2017-01-13 DIAGNOSIS — R410 Disorientation, unspecified: Secondary | ICD-10-CM | POA: Diagnosis not present

## 2017-01-13 DIAGNOSIS — I7300 Raynaud's syndrome without gangrene: Secondary | ICD-10-CM | POA: Diagnosis not present

## 2017-01-13 DIAGNOSIS — L659 Nonscarring hair loss, unspecified: Secondary | ICD-10-CM | POA: Diagnosis not present

## 2017-01-13 DIAGNOSIS — M791 Myalgia: Secondary | ICD-10-CM | POA: Diagnosis not present

## 2017-01-13 DIAGNOSIS — R252 Cramp and spasm: Secondary | ICD-10-CM | POA: Diagnosis not present

## 2017-01-19 DIAGNOSIS — J441 Chronic obstructive pulmonary disease with (acute) exacerbation: Secondary | ICD-10-CM | POA: Diagnosis not present

## 2017-03-06 DIAGNOSIS — M48061 Spinal stenosis, lumbar region without neurogenic claudication: Secondary | ICD-10-CM | POA: Diagnosis not present

## 2017-03-06 DIAGNOSIS — J42 Unspecified chronic bronchitis: Secondary | ICD-10-CM | POA: Diagnosis not present

## 2017-03-26 DIAGNOSIS — J439 Emphysema, unspecified: Secondary | ICD-10-CM | POA: Diagnosis not present

## 2017-03-26 DIAGNOSIS — J441 Chronic obstructive pulmonary disease with (acute) exacerbation: Secondary | ICD-10-CM | POA: Diagnosis not present

## 2017-04-02 DIAGNOSIS — J441 Chronic obstructive pulmonary disease with (acute) exacerbation: Secondary | ICD-10-CM | POA: Diagnosis not present

## 2017-04-02 DIAGNOSIS — J439 Emphysema, unspecified: Secondary | ICD-10-CM | POA: Diagnosis not present

## 2017-04-03 DIAGNOSIS — M549 Dorsalgia, unspecified: Secondary | ICD-10-CM | POA: Diagnosis not present

## 2017-04-03 DIAGNOSIS — M5136 Other intervertebral disc degeneration, lumbar region: Secondary | ICD-10-CM | POA: Diagnosis not present

## 2017-04-03 DIAGNOSIS — M546 Pain in thoracic spine: Secondary | ICD-10-CM | POA: Diagnosis not present

## 2017-04-03 DIAGNOSIS — M48062 Spinal stenosis, lumbar region with neurogenic claudication: Secondary | ICD-10-CM | POA: Diagnosis not present

## 2017-04-03 DIAGNOSIS — M47816 Spondylosis without myelopathy or radiculopathy, lumbar region: Secondary | ICD-10-CM | POA: Diagnosis not present

## 2017-04-03 DIAGNOSIS — Z981 Arthrodesis status: Secondary | ICD-10-CM | POA: Diagnosis not present

## 2017-04-06 ENCOUNTER — Other Ambulatory Visit: Payer: Self-pay | Admitting: Neurosurgery

## 2017-04-06 DIAGNOSIS — M47816 Spondylosis without myelopathy or radiculopathy, lumbar region: Secondary | ICD-10-CM | POA: Diagnosis not present

## 2017-04-06 DIAGNOSIS — Z981 Arthrodesis status: Secondary | ICD-10-CM

## 2017-04-07 DIAGNOSIS — L820 Inflamed seborrheic keratosis: Secondary | ICD-10-CM | POA: Diagnosis not present

## 2017-04-07 DIAGNOSIS — Z85820 Personal history of malignant melanoma of skin: Secondary | ICD-10-CM | POA: Diagnosis not present

## 2017-04-07 DIAGNOSIS — D2239 Melanocytic nevi of other parts of face: Secondary | ICD-10-CM | POA: Diagnosis not present

## 2017-04-07 DIAGNOSIS — R233 Spontaneous ecchymoses: Secondary | ICD-10-CM | POA: Diagnosis not present

## 2017-04-07 DIAGNOSIS — D225 Melanocytic nevi of trunk: Secondary | ICD-10-CM | POA: Diagnosis not present

## 2017-04-08 ENCOUNTER — Other Ambulatory Visit: Payer: Self-pay | Admitting: Neurosurgery

## 2017-04-08 DIAGNOSIS — G9519 Other vascular myelopathies: Secondary | ICD-10-CM

## 2017-04-08 DIAGNOSIS — M48062 Spinal stenosis, lumbar region with neurogenic claudication: Secondary | ICD-10-CM

## 2017-04-20 ENCOUNTER — Ambulatory Visit
Admission: RE | Admit: 2017-04-20 | Discharge: 2017-04-20 | Disposition: A | Discharge status: Home / Self Care | Payer: PPO | Source: Ambulatory Visit | Attending: Neurosurgery | Admitting: Neurosurgery

## 2017-04-20 DIAGNOSIS — Z981 Arthrodesis status: Secondary | ICD-10-CM

## 2017-04-20 DIAGNOSIS — M48061 Spinal stenosis, lumbar region without neurogenic claudication: Secondary | ICD-10-CM | POA: Diagnosis not present

## 2017-04-20 DIAGNOSIS — M48062 Spinal stenosis, lumbar region with neurogenic claudication: Secondary | ICD-10-CM

## 2017-04-20 DIAGNOSIS — G9519 Other vascular myelopathies: Secondary | ICD-10-CM

## 2017-04-20 MED ORDER — GADOBENATE DIMEGLUMINE 529 MG/ML IV SOLN
10.0000 mL | Freq: Once | INTRAVENOUS | Status: AC | PRN
  Administered 2017-04-20: 10 mL via INTRAVENOUS

## 2017-04-23 DIAGNOSIS — L820 Inflamed seborrheic keratosis: Secondary | ICD-10-CM | POA: Diagnosis not present

## 2017-04-27 DIAGNOSIS — M47816 Spondylosis without myelopathy or radiculopathy, lumbar region: Secondary | ICD-10-CM | POA: Diagnosis not present

## 2017-04-27 DIAGNOSIS — M5136 Other intervertebral disc degeneration, lumbar region: Secondary | ICD-10-CM | POA: Diagnosis not present

## 2017-04-27 DIAGNOSIS — M48062 Spinal stenosis, lumbar region with neurogenic claudication: Secondary | ICD-10-CM | POA: Diagnosis not present

## 2017-04-27 DIAGNOSIS — M4316 Spondylolisthesis, lumbar region: Secondary | ICD-10-CM | POA: Diagnosis not present

## 2017-05-07 DIAGNOSIS — Z85820 Personal history of malignant melanoma of skin: Secondary | ICD-10-CM | POA: Diagnosis not present

## 2017-05-07 DIAGNOSIS — Z862 Personal history of diseases of the blood and blood-forming organs and certain disorders involving the immune mechanism: Secondary | ICD-10-CM | POA: Diagnosis not present

## 2017-05-07 DIAGNOSIS — C4362 Malignant melanoma of left upper limb, including shoulder: Secondary | ICD-10-CM | POA: Diagnosis not present

## 2017-05-14 DIAGNOSIS — M5136 Other intervertebral disc degeneration, lumbar region: Secondary | ICD-10-CM | POA: Diagnosis not present

## 2017-05-20 DIAGNOSIS — I519 Heart disease, unspecified: Secondary | ICD-10-CM | POA: Diagnosis not present

## 2017-05-20 DIAGNOSIS — M150 Primary generalized (osteo)arthritis: Secondary | ICD-10-CM | POA: Diagnosis not present

## 2017-05-20 DIAGNOSIS — L659 Nonscarring hair loss, unspecified: Secondary | ICD-10-CM | POA: Diagnosis not present

## 2017-05-20 DIAGNOSIS — R05 Cough: Secondary | ICD-10-CM | POA: Diagnosis not present

## 2017-05-20 DIAGNOSIS — Z6823 Body mass index (BMI) 23.0-23.9, adult: Secondary | ICD-10-CM | POA: Diagnosis not present

## 2017-05-20 DIAGNOSIS — M0609 Rheumatoid arthritis without rheumatoid factor, multiple sites: Secondary | ICD-10-CM | POA: Diagnosis not present

## 2017-05-20 DIAGNOSIS — R410 Disorientation, unspecified: Secondary | ICD-10-CM | POA: Diagnosis not present

## 2017-05-20 DIAGNOSIS — I7300 Raynaud's syndrome without gangrene: Secondary | ICD-10-CM | POA: Diagnosis not present

## 2017-06-04 DIAGNOSIS — E039 Hypothyroidism, unspecified: Secondary | ICD-10-CM | POA: Diagnosis not present

## 2017-06-08 DIAGNOSIS — M5136 Other intervertebral disc degeneration, lumbar region: Secondary | ICD-10-CM | POA: Diagnosis not present

## 2017-07-06 DIAGNOSIS — M5136 Other intervertebral disc degeneration, lumbar region: Secondary | ICD-10-CM | POA: Diagnosis not present

## 2017-07-06 DIAGNOSIS — M47816 Spondylosis without myelopathy or radiculopathy, lumbar region: Secondary | ICD-10-CM | POA: Diagnosis not present

## 2017-07-06 DIAGNOSIS — M48062 Spinal stenosis, lumbar region with neurogenic claudication: Secondary | ICD-10-CM | POA: Diagnosis not present

## 2017-07-06 DIAGNOSIS — M4316 Spondylolisthesis, lumbar region: Secondary | ICD-10-CM | POA: Diagnosis not present

## 2017-09-10 DIAGNOSIS — M48062 Spinal stenosis, lumbar region with neurogenic claudication: Secondary | ICD-10-CM | POA: Diagnosis not present

## 2017-10-06 DIAGNOSIS — C44529 Squamous cell carcinoma of skin of other part of trunk: Secondary | ICD-10-CM | POA: Diagnosis not present

## 2017-10-06 DIAGNOSIS — D2239 Melanocytic nevi of other parts of face: Secondary | ICD-10-CM | POA: Diagnosis not present

## 2017-10-06 DIAGNOSIS — Z85820 Personal history of malignant melanoma of skin: Secondary | ICD-10-CM | POA: Diagnosis not present

## 2017-10-06 DIAGNOSIS — R233 Spontaneous ecchymoses: Secondary | ICD-10-CM | POA: Diagnosis not present

## 2017-10-06 DIAGNOSIS — D225 Melanocytic nevi of trunk: Secondary | ICD-10-CM | POA: Diagnosis not present

## 2017-10-13 DIAGNOSIS — M4316 Spondylolisthesis, lumbar region: Secondary | ICD-10-CM | POA: Diagnosis not present

## 2017-10-13 DIAGNOSIS — Z6825 Body mass index (BMI) 25.0-25.9, adult: Secondary | ICD-10-CM | POA: Diagnosis not present

## 2017-10-13 DIAGNOSIS — R030 Elevated blood-pressure reading, without diagnosis of hypertension: Secondary | ICD-10-CM | POA: Diagnosis not present

## 2017-10-13 DIAGNOSIS — M48062 Spinal stenosis, lumbar region with neurogenic claudication: Secondary | ICD-10-CM | POA: Diagnosis not present

## 2017-10-30 ENCOUNTER — Other Ambulatory Visit: Payer: Self-pay | Admitting: Neurosurgery

## 2017-10-30 DIAGNOSIS — M5136 Other intervertebral disc degeneration, lumbar region: Secondary | ICD-10-CM | POA: Diagnosis not present

## 2017-10-30 DIAGNOSIS — M48062 Spinal stenosis, lumbar region with neurogenic claudication: Secondary | ICD-10-CM | POA: Diagnosis not present

## 2017-10-30 DIAGNOSIS — M4316 Spondylolisthesis, lumbar region: Secondary | ICD-10-CM | POA: Diagnosis not present

## 2017-10-30 DIAGNOSIS — M47816 Spondylosis without myelopathy or radiculopathy, lumbar region: Secondary | ICD-10-CM | POA: Diagnosis not present

## 2017-11-06 DIAGNOSIS — R0602 Shortness of breath: Secondary | ICD-10-CM | POA: Diagnosis not present

## 2017-11-06 DIAGNOSIS — I519 Heart disease, unspecified: Secondary | ICD-10-CM | POA: Diagnosis not present

## 2017-11-06 DIAGNOSIS — R000 Tachycardia, unspecified: Secondary | ICD-10-CM | POA: Diagnosis not present

## 2017-11-09 DIAGNOSIS — I519 Heart disease, unspecified: Secondary | ICD-10-CM | POA: Diagnosis not present

## 2017-11-13 DIAGNOSIS — I272 Pulmonary hypertension, unspecified: Secondary | ICD-10-CM | POA: Diagnosis not present

## 2017-11-17 NOTE — Pre-Procedure Instructions (Signed)
[name]  11/17/2017      [facility] [address], [place] [address] Phone: [phone] Fax: [phone]  [facility] [place] [address] Phone: [phone] Fax: [phone]  [place], [place] - [address] [facility] [address] [place] [address] Phone: [phone] Fax: [phone]    Your procedure is scheduled on January 28  Report to [facility] at [place].M.  Call this number if you have problems the morning of surgery:  [phone]   Remember:  Do not eat food or drink liquids after midnight.  Continue all medications as directed by your physician except follow these medication instructions before surgery below   Take these medicines the morning of surgery with A SIP OF WATER  azelastine (ASTELIN) carvedilol (COREG) cyclobenzaprine (FLEXERIL)  fluticasone (FLONASE) fluticasone furoate-vilanterol (BREO ELLIPTA)  levothyroxine (SYNTHROID, LEVOTHROID)  omeprazole (PRILOSEC)   7 days prior to surgery STOP taking any Aspirin(unless otherwise instructed by your surgeon), Aleve, Naproxen, Ibuprofen, Motrin, Advil, Goody's, BC's, all herbal medications, fish oil, and all vitamins    Do not wear jewelry, make-up or nail polish.  Do not wear lotions, powders, or perfumes, or deodorant.  Do not shave 48 hours prior to surgery.    Do not bring valuables to the hospital.  [facility] is not responsible for any belongings or valuables.  Contacts, dentures or bridgework may not be worn into surgery.  Leave your suitcase in the car.  After surgery it may be brought to your room.  For patients admitted to the hospital, discharge time will be determined by your treatment team.  Patients discharged the day of surgery will not be allowed to drive home.    Special  instructions:   [place]- Preparing For Surgery  Before surgery, you can play an important role. Because skin is not sterile, your skin needs to be as free of germs as possible. You can reduce the number of germs on your skin by washing with CHG (chlorahexidine gluconate) Soap before surgery.  CHG is an antiseptic cleaner which kills germs and bonds with the skin to continue killing germs even after washing.  Please do not use if you have an allergy to CHG or antibacterial soaps. If your skin becomes reddened/irritated stop using the CHG.  Do not shave (including legs and underarms) for at least 48 hours prior to first CHG shower. It is OK to shave your face.  Please follow these instructions carefully.   1. Shower the NIGHT BEFORE SURGERY and the MORNING OF SURGERY with CHG.   2. If you chose to wash your hair, wash your hair first as usual with your normal shampoo.  3. After you shampoo, rinse your hair and body thoroughly to remove the shampoo.  4. Use CHG as you would any other liquid soap. You can apply CHG directly to the skin and wash gently with a scrungie or a clean washcloth.   5. Apply the CHG Soap to your body ONLY FROM THE NECK DOWN.  Do not use on open wounds or open sores. Avoid contact with your eyes, ears, mouth and genitals (private parts). Wash Face and genitals (private parts)  with your normal soap.  6. Wash thoroughly, paying special attention to the area where your surgery will be performed.  7. Thoroughly rinse your body with warm water from the neck down.  8. DO NOT shower/wash with your normal soap after using and rinsing off the CHG Soap.  9. Pat yourself dry with a CLEAN TOWEL.  10. Wear CLEAN PAJAMAS to bed the night before surgery, wear comfortable clothes the morning of surgery  11. Place CLEAN SHEETS on your bed the night of your first shower and DO NOT SLEEP WITH PETS.    Day of Surgery: Do not apply any deodorants/lotions. Please wear clean  clothes to the hospital/surgery center.      Please read over the following fact sheets that you were given.

## 2017-11-18 ENCOUNTER — Encounter (HOSPITAL_COMMUNITY): Payer: Self-pay

## 2017-11-18 ENCOUNTER — Encounter (HOSPITAL_COMMUNITY)
Admission: RE | Admit: 2017-11-18 | Discharge: 2017-11-18 | Disposition: A | Discharge status: Home / Self Care | Payer: PPO | Source: Ambulatory Visit | Attending: Neurosurgery | Admitting: Neurosurgery

## 2017-11-18 ENCOUNTER — Other Ambulatory Visit: Payer: Self-pay

## 2017-11-18 DIAGNOSIS — Z01812 Encounter for preprocedural laboratory examination: Secondary | ICD-10-CM | POA: Diagnosis not present

## 2017-11-18 HISTORY — DX: Chronic obstructive pulmonary disease, unspecified: J44.9

## 2017-11-18 HISTORY — DX: Dyspnea, unspecified: R06.00

## 2017-11-18 HISTORY — DX: Hypotension, unspecified: I95.9

## 2017-11-18 HISTORY — DX: Spinal stenosis, lumbar region with neurogenic claudication: M48.062

## 2017-11-18 HISTORY — DX: Malignant (primary) neoplasm, unspecified: C80.1

## 2017-11-18 LAB — CBC
HCT: 41.2 % (ref 36.0–46.0)
Hemoglobin: 13.5 g/dL (ref 12.0–15.0)
MCH: 29.3 pg (ref 26.0–34.0)
MCHC: 32.8 g/dL (ref 30.0–36.0)
MCV: 89.6 fL (ref 78.0–100.0)
PLATELETS: 345 10*3/uL (ref 150–400)
RBC: 4.6 MIL/uL (ref 3.87–5.11)
RDW: 14.5 % (ref 11.5–15.5)
WBC: 11.1 10*3/uL — ABNORMAL HIGH (ref 4.0–10.5)

## 2017-11-18 LAB — BASIC METABOLIC PANEL
Anion gap: 10 (ref 5–15)
BUN: 12 mg/dL (ref 6–20)
CO2: 24 mmol/L (ref 22–32)
CREATININE: 0.53 mg/dL (ref 0.44–1.00)
Calcium: 9.2 mg/dL (ref 8.9–10.3)
Chloride: 104 mmol/L (ref 101–111)
GFR calc non Af Amer: >60 mL/min (ref >60)
Glucose, Bld: 114 mg/dL — ABNORMAL HIGH (ref 65–99)
Potassium: 4 mmol/L (ref 3.5–5.1)
Sodium: 138 mmol/L (ref 135–145)

## 2017-11-18 LAB — TYPE AND SCREEN
ABO/RH(D): A POS
Antibody Screen: NEGATIVE

## 2017-11-18 LAB — SURGICAL PCR SCREEN
MRSA, PCR: NEGATIVE
Staphylococcus aureus: POSITIVE — AB

## 2017-11-18 NOTE — Progress Notes (Addendum)
PCP - Dr. Teressa Lower- [facility]  Cardiologist - Dr. Alvester Chou- Pt sts she saw him last 1 yr ago, and he released her from his care due to her last findings as being "normal", per pt.  Chest x-ray - 11/09/17 (CE)  EKG - 11/06/17 (CE)  Stress Test - Denies  ECHO - 2015 (E), 2019- Per pt-Requested  Cardiac Cath - Denies  Sleep Study - Yes- Negative CPAP - No  LABS- 11/18/17: CBC, BMP, T/S  Pt also sts she normally has a low bp, pt advised to hold her Coreg the dos if the bp is below 100, and to inform the nurse upon arrival so a treatment plan can be discussed with doctor.   Anesthesia- Yes- Requested records, cardiac hx  Pt denies having chest pain, sob, or fever at this time. All instructions explained to the pt, with a verbal understanding of the material. Pt agrees to go over the instructions while at home for a better understanding. The opportunity to ask questions was provided.

## 2017-11-19 ENCOUNTER — Encounter (HOSPITAL_COMMUNITY): Payer: Self-pay

## 2017-11-19 ENCOUNTER — Encounter (HOSPITAL_COMMUNITY): Payer: Self-pay | Admitting: Emergency Medicine

## 2017-11-19 NOTE — Progress Notes (Signed)
Anesthesia Chart Review:  Pt is a 72 year old female scheduled for L2-4 decompressive laminectomy, facetectomy, and foraminotomy; L2-3, L3-4 PLIF; L2-4 arthrodesis on 11/23/2017 with Jovita Gamma, MD  - PCP is Teressa Lower, MD (notes in care everywhere).  Evaluated pt for dyspnea, DOE, orthopnea 11/06/17. Plan was to order echo and if echo did not explain symptoms, plan was to get CT chest.  Echo normal, but per notes in care everywhere, pt does not want to delay surgery for further work up.  Dr. Garlon Hatchet comments "Still have not explained the swelling and shortness of breath, may not be safe to have the surgery"  - Used to see cardiologist Quay Burow, MD for PVCs and CHF. Last office visit 10/01/16; f/u prn recommended.    PMH includes:  CHF, PVCs, hypotension, hyperlipidemia, hyperthyroidism, COPD, syncope (2014), GERD. Former smoker. BMI 24.5  Medications include: carvedilol, breo ellipta, levothyroxine, prilosec  BP 92/80 Comment: taken in right arm  Pulse (!) 108 Comment: taking manually  Temp 37.1 C   Resp 20   Ht 5\' 1"  (1.549 m)   Wt 129 lb 11.2 oz (58.8 kg)   SpO2 96%   BMI 24.51 kg/m   Preoperative labs reviewed.    EKG 11/06/17 (Dr. Arna Medici office): Sinus tachycardia (113 bpm). Nonspecific ST-T changes  Echo 11/13/17 [facility]):  1.  Normal LV size, thickness, and function.  EF 55-60%. 2.  Grade I diastolic dysfunction. 3.  Normal RV size and function. 4.  No significant valvular regurgitation or stenosis. 5.  Estimated PASP 24 mmHg, normal 6.  Normal caliber IVC. 7.  No pericardial effusion.  Carotid duplex 12/13/12:  - No significant extracranial carotid artery stenosis demonstrated. Vertebrals are patent with antegrade flow.  Reviewed case with Dr. Therisa Doyne. Pt will need medical clearance prior to surgery. I notified Nikki in Dr. Donnella Bi office.   Willeen Cass, FNP-BC [facility] Short Stay Surgical Center/Anesthesiology Phone: [phone] 11/19/2017  4:48 PM

## 2017-11-23 ENCOUNTER — Inpatient Hospital Stay (HOSPITAL_COMMUNITY): Admission: RE | Admit: 2017-11-23 | Payer: PPO | Source: Ambulatory Visit | Admitting: Neurosurgery

## 2017-11-23 ENCOUNTER — Encounter (HOSPITAL_COMMUNITY): Admission: RE | Payer: Self-pay | Source: Ambulatory Visit

## 2017-11-23 SURGERY — POSTERIOR LUMBAR FUSION 2 LEVEL
Anesthesia: General | Site: Back

## 2017-11-25 DIAGNOSIS — R0600 Dyspnea, unspecified: Secondary | ICD-10-CM | POA: Diagnosis not present

## 2017-11-25 DIAGNOSIS — K8080 Other cholelithiasis without obstruction: Secondary | ICD-10-CM | POA: Diagnosis not present

## 2017-12-02 ENCOUNTER — Telehealth: Payer: Self-pay | Admitting: Cardiovascular Disease

## 2017-12-02 NOTE — Telephone Encounter (Signed)
   Primary Cardiologist:Jonathan Gwenlyn Found, MD  Chart reviewed as part of pre-operative protocol coverage. Because of JKAYLA SPIEWAK past medical history and time since last visit, he/she will require a follow-up visit in order to better assess preoperative cardiovascular risk.  Pre-op covering staff: - Please schedule appointment and call patient to inform them. - Please contact requesting surgeon's office via preferred method (i.e, phone, fax) to inform them of need for appointment prior to surgery.  Tami Lin Acasia Skilton, PA  12/02/2017, 2:47 PM

## 2017-12-02 NOTE — Telephone Encounter (Signed)
New message       [facility] Pre-operative Risk Assessment    Request for surgical clearance:  1. What type of surgery is being performed? Back surgery  2. When is this surgery scheduled? TBD  3. What type of clearance is required (medical clearance vs. Pharmacy clearance to hold med vs. Both)? BOTH  4. Are there any medications that need to be held prior to surgery and how long?N/A  5. Practice name and name of physician performing surgery?DR DOUGH  6. What is your office phone and fax number? Fax [phone]  7. Anesthesia type (None, local, MAC, general) ? general   [name] 12/02/2017, 11:16 AM  _________________________________________________________________   (provider comments below)

## 2017-12-03 NOTE — Telephone Encounter (Signed)
Called patient to schedule appointment per PA request.   She states she saw Dr. Gwenlyn Found and was released - f/up PRN  She has surgery with Dr. Jovita Gamma @ [facility]  (p) [phone]   She reports she had a cardiology work up (echo, ct scan) at [facility] she would need clearance with a cardiology provider.   Scheduled patient for OV with Donnie Aho, PA on 2/13 @ 1130am

## 2017-12-04 ENCOUNTER — Other Ambulatory Visit: Payer: Self-pay | Admitting: Neurosurgery

## 2017-12-08 ENCOUNTER — Encounter: Payer: Self-pay | Admitting: Physician Assistant

## 2017-12-08 ENCOUNTER — Ambulatory Visit (INDEPENDENT_AMBULATORY_CARE_PROVIDER_SITE_OTHER): Payer: PPO | Admitting: Physician Assistant

## 2017-12-08 VITALS — BP 122/76 | HR 119 | Ht 60.0 in | Wt 133.0 lb

## 2017-12-08 DIAGNOSIS — R000 Tachycardia, unspecified: Secondary | ICD-10-CM

## 2017-12-08 DIAGNOSIS — R002 Palpitations: Secondary | ICD-10-CM

## 2017-12-08 DIAGNOSIS — Z01818 Encounter for other preprocedural examination: Secondary | ICD-10-CM | POA: Diagnosis not present

## 2017-12-08 DIAGNOSIS — R5383 Other fatigue: Secondary | ICD-10-CM

## 2017-12-08 DIAGNOSIS — I495 Sick sinus syndrome: Secondary | ICD-10-CM | POA: Diagnosis not present

## 2017-12-08 MED ORDER — CARVEDILOL 6.25 MG PO TABS: 6.2500 mg | ORAL_TABLET | Freq: Two times a day (BID) | ORAL | 4 refills | Status: DC

## 2017-12-08 NOTE — Patient Instructions (Addendum)
Medication Instructions:  INCREASE Coreg 6.25mg  twice a day-CALL THE OFFICE IF YOU EXPERIENCE ANY SIDE EFFECTS, MAKE SURE TO TAKE THIS MEDICATION ON THE DAY OF YOUR SURGERY.  Labwork: Your physician recommends that you return for lab work in: TODAY-TSH   Testing/Procedures: None   Follow-Up: Your physician wants you to follow-up in: 12 months with Dr Gwenlyn Found. You will receive a reminder letter in the mail two months in advance. If you don't receive a letter, please call our office to schedule the follow-up appointment.  Any Other Special Instructions Will Be Listed Below (If Applicable). DRINK AT LEAST 1-2 QUARTS LIQUIDS A DAY  If you need a refill on your cardiac medications before your next appointment, please call your pharmacy.

## 2017-12-08 NOTE — Progress Notes (Signed)
Cardiology Office Note   Date:  12/08/2017   ID:  [name], DOB Oct 06, 1946, MRN [id number]  PCP:  Algis Greenhouse, MD  Cardiologist: Dr. Gwenlyn Found, 10/01/2016 Rosaria Ferries, PA-C   Chief Complaint  Patient presents with  . Follow-up    History of Present Illness: [name] is a 72 y.o. female with a history of PVCs, EF 40-45%>> 50-55% by echo, HLD, hypothyroid, hypotension, COPD  12/02/2017 phone notes regarding need for back surgery, cardiology clearance needed, appointment made  [name] presents for cardiology evaluation.  She used to exercise regularly. However, because of back issues, she is not able to do much. She is not able to work in her garden or hike recently. She cleans house, vacuums, without chest pain or SOB. She has 6 steps to get in the house, does not get SOB or CP climbing them.   When she first gets out of bed, she is sometimes SOB. She uses her inhaler and that gets better. She does not otherwise feel limited by her breathing. Does not wheeze.   Her HR is always a little high, today she was anxious about being late. She feels that is why her HR is higher than normal. She takes the Coreg once daily, takes it at night, has done that for years. She felt the am pill made her heart flutter.   The PVCs got better on the Coreg, she does not get them now. She drinks decaff products. She does not use OTC cold meds.   Last stress test was years ago, she did well.  She gets tired fairly easily, but can push through what ever she needs to do.   Past Medical History:  Diagnosis Date  . Arthritis   . Back pain   . Cancer (HCC)    Skin  . COPD (chronic obstructive pulmonary disease) ([place])   . Dyspnea   . Dysrhythmia    bradycardia  . Ex-smoker    Smoked 1 PPD x 20 years, quit 1994  . GERD (gastroesophageal reflux disease)   . Hip pain   . Hyperlipidemia    statin intolerant  . Hyperthyroidism   . Hypotension   . Lumbar stenosis with neurogenic  claudication   . LV dysfunction    ejection fraction 40-45% by 2-D echocardiogram  . Palpitations    PVCs on Holter monitoring  . Restless leg syndrome   . Syncope     Past Surgical History:  Procedure Laterality Date  . BACK SURGERY    . BLADDER SUSPENSION    . ESOPHAGEAL DILATION     X 2  . RIGHT ROTATOR CUFF REPAIR      Current Outpatient Medications  Medication Sig Dispense Refill  . Alpha-Lipoic Acid 100 MG CAPS Take 100 mg by mouth daily.     Marland Kitchen azelastine (ASTELIN) 137 MCG/SPRAY nasal spray Place 1 spray into both nostrils 2 (two) times daily.     . carvedilol (COREG) 6.25 MG tablet Take 1 tablet (6.25 mg total) by mouth daily. (Patient taking differently: Take 6.25 mg by mouth every evening. ) 90 tablet 4  . cholecalciferol (VITAMIN D) 1000 UNITS tablet Take 2,000 Units by mouth daily.     . Coenzyme Q10 (COQ-10) 100 MG CAPS Take 100 mg by mouth daily.     . cyclobenzaprine (FLEXERIL) 10 MG tablet Take 10 mg by mouth 2 (two) times daily.     . fluticasone (FLONASE) 50 MCG/ACT nasal spray Place  1 spray into both nostrils 2 (two) times daily.     . fluticasone furoate-vilanterol (BREO ELLIPTA) 100-25 MCG/INH AEPB Inhale 1 puff into the lungs daily.    Marland Kitchen ketoconazole (NIZORAL) 2 % cream Apply 1 application topically daily as needed for irritation.     Marland Kitchen L-Lysine 500 MG TABS Take 250 mg by mouth 2 (two) times daily.    Marland Kitchen levothyroxine (SYNTHROID, LEVOTHROID) 50 MCG tablet Take 50 mcg by mouth every evening.     . Melatonin 5 MG TABS Take 5 mg by mouth at bedtime.     Marland Kitchen omeprazole (PRILOSEC) 40 MG capsule Take 40 mg by mouth every evening.    Marland Kitchen rOPINIRole (REQUIP) 2 MG tablet Take 2 mg by mouth 3 (three) times daily.     . temazepam (RESTORIL) 30 MG capsule Take 30 mg by mouth at bedtime.     No current facility-administered medications for this visit.     Allergies:   Amoxicillin; Ciprofloxacin; Statins; Sulfa antibiotics; and Sulfamethoxazole    Social History:  The  patient  reports that she quit smoking about 24 years ago. Her smoking use included cigarettes. she has never used smokeless tobacco. She reports that she drinks alcohol. She reports that she does not use drugs.   Family History:  The patient's She was adopted. Family history is unknown by patient.    ROS:  Please see the history of present illness. All other systems are reviewed and negative.    PHYSICAL EXAM: VS:  BP 122/76   Pulse (!) 119   Ht 5' (1.524 m)   Wt 133 lb (60.3 kg)   BMI 25.97 kg/m  , BMI Body mass index is 25.97 kg/m. GEN: Well nourished, well developed, female in no acute distress  HEENT: normal for age  Neck: JVD 9 cm, no carotid bruit, no masses Cardiac: Rapid but RRR; berno murmur, no rubs, or gallops Respiratory: Decreased breath sounds bases bilaterally, normal work of breathing GI: soft, nontender, nondistended, + BS MS: no deformity or atrophy; no edema; distal pulses are 2+ in all 4 extremities   Skin: warm and dry, no rash Neuro:  Strength and sensation are intact Psych: euthymic mood, full affect   EKG:  EKG is ordered today. The ekg ordered today demonstrates ST, HR 119, no change from 09/2016 except faster rate  ECHO: 04/26/2014 - Left ventricle: The cavity size was normal. Wall thickness was normal. Systolic function was normal. The estimated ejection fraction was in the range of 50% to 55%. Wall motion was normal; there were no regional wall motion abnormalities. Doppler parameters are consistent with abnormal left ventricular relaxation (grade 1 diastolic dysfunction).   Recent Labs: 11/18/2017: BUN 12; Creatinine, Ser 0.53; Hemoglobin 13.5; Platelets 345; Potassium 4.0; Sodium 138    Lipid Panel    Component Value Date/Time   CHOL 274 (H) 12/08/2012 0650   TRIG 200 (H) 12/08/2012 0650   HDL 51 12/08/2012 0650   CHOLHDL 5.4 12/08/2012 0650   VLDL 40 12/08/2012 0650   LDLCALC 183 (H) 12/08/2012 0650     Wt Readings from  Last 3 Encounters:  12/08/17 133 lb (60.3 kg)  11/18/17 129 lb 11.2 oz (58.8 kg)  10/01/16 142 lb (64.4 kg)     Other studies Reviewed: Additional studies/ records that were reviewed today include: office notes and testing .  ASSESSMENT AND PLAN:  1.  Preoperative evaluation: Ms. Claycomb has no ongoing ischemic symptoms.  Her activity level is not limited  by cardiac issues, but by musculoskeletal issues.  Previous stress testing was normal.  Her last echo was 2015, EF normal.    From a cardiac standpoint, she is at acceptable risk for the planned procedure without any additional workup.  However, careful attention will have to be paid to her respiratory status during the procedure.  2.  PVCs and sinus tachycardia: Her heart rate is higher than normal today.  She describes fatigue.  She has had an abnormal TSH in the past, we will recheck it.  She has a recent CBC and BMET, she was not anemic and not dehydrated.  I have requested that she start taking the carvedilol 6.25 mg twice a day.  She is also advised that she needs to make sure she takes the carvedilol on the day of her surgery.  If she gets side effects from the medication, she is to let us know.  We can try another beta-blocker if so.   Current medicines are reviewed at length with the patient today.  The patient does not have concerns regarding medicines.  The following changes have been made: Increase carvedilol  Labs/ tests ordered today include:   Orders Placed This Encounter  Procedures  . TSH  . EKG 12-Lead     Disposition:   FU with Dr. Gwenlyn Found  Signed, Rosaria Ferries, PA-C  12/08/2017 4:39 PM    [place] Phone: [phone]; Fax: [phone]  This note was written with the assistance of speech recognition software. Please excuse any transcriptional errors.

## 2017-12-09 LAB — TSH: TSH: 2.45 u[IU]/mL (ref 0.450–4.500)

## 2017-12-16 ENCOUNTER — Other Ambulatory Visit: Payer: Self-pay

## 2017-12-16 ENCOUNTER — Encounter (HOSPITAL_COMMUNITY): Payer: Self-pay | Admitting: *Deleted

## 2017-12-16 NOTE — Progress Notes (Signed)
Anesthesia Chart Review:  Pt is a same day work up.   Pt is a 72 year old female scheduled for L2-4 decompressive laminectomy, facetectomy, and foraminotomy; L2-3, L3-4 PLIF; L2-4 arthrodesis on 11/23/2017 with Jovita Gamma, MD  Surgery was originally scheduled for 11/23/17 but was postponed in order for pt to have further evaluation for c/o dyspnea, DOE, orthopnea.  Has subsequently seen by Rosaria Ferries, PA with cardiology 12/08/17 and cleared for surgery; pt tachycardic -> coreg changed to BID and pt instructed to take day of surgery  - PCP is Teressa Lower, MD who cleared pt for surgery (notes in care everywhere).    PMH includes:  CHF, PVCs, hypotension, hyperlipidemia, hyperthyroidism, COPD, syncope (2014), GERD. Former smoker. BMI 24.5  Medications include: carvedilol, breo ellipta, levothyroxine, prilosec  Labs will be obtained day of surgery.   EKG 12/08/17: Sinus tachycardia (119 bpm).  Nonspecific ST abnormality.  Echo 11/13/17 [facility]):  1.  Normal LV size, thickness, and function.  EF 55-60%. 2.  Grade I diastolic dysfunction. 3.  Normal RV size and function. 4.  No significant valvular regurgitation or stenosis. 5.  Estimated PASP 24 mmHg, normal 6.  Normal caliber IVC. 7.  No pericardial effusion.  Carotid duplex 12/13/12:  - No significant extracranial carotid artery stenosis demonstrated. Vertebrals are patent with antegrade flow.  If labs acceptable day of surgery and HR acceptable, I anticipate pt can proceed with surgery as scheduled.   Willeen Cass, FNP-BC [facility] Short Stay Surgical Center/Anesthesiology Phone: [phone] 12/16/2017 12:00 PM

## 2017-12-16 NOTE — Progress Notes (Signed)
Pt denies any acute cardiopulmonary issues. Pt denies having a cardiac cath but stated that a stress test was performed > 10 years ago. Pt stated that she has stopped taking Aspirin, vitamins, fish oil, Alpha Lipoic Acid, Co Q 10, Melatonin and herbal medications. Do not take any NSAIDs ie: Ibuprofen, Advil, Naproxen (Aleve) , Motrin, BC and Goody Powder (per previous PAT).  Pt to shower with CHG tonight and DOS. Pt verbalized understanding of all pre-op instructions. Anesthesia reviewed pt history (see note).

## 2017-12-17 ENCOUNTER — Encounter (HOSPITAL_COMMUNITY): Payer: Self-pay

## 2017-12-17 ENCOUNTER — Inpatient Hospital Stay (HOSPITAL_COMMUNITY): Payer: PPO

## 2017-12-17 ENCOUNTER — Encounter (HOSPITAL_COMMUNITY)
Admission: RE | Disposition: A | Discharge status: Home / Self Care | Payer: Self-pay | Source: Ambulatory Visit | Attending: Neurosurgery

## 2017-12-17 ENCOUNTER — Inpatient Hospital Stay (HOSPITAL_COMMUNITY): Payer: PPO | Admitting: Emergency Medicine

## 2017-12-17 ENCOUNTER — Inpatient Hospital Stay (HOSPITAL_COMMUNITY)
Admission: RE | Admit: 2017-12-17 | Discharge: 2017-12-18 | DRG: 455 | Disposition: A | Discharge status: Home / Self Care | Payer: PPO | Source: Ambulatory Visit | Attending: Neurosurgery | Admitting: Neurosurgery

## 2017-12-17 DIAGNOSIS — J438 Other emphysema: Secondary | ICD-10-CM | POA: Diagnosis present

## 2017-12-17 DIAGNOSIS — Z881 Allergy status to other antibiotic agents status: Secondary | ICD-10-CM

## 2017-12-17 DIAGNOSIS — Z79890 Hormone replacement therapy (postmenopausal): Secondary | ICD-10-CM

## 2017-12-17 DIAGNOSIS — Z888 Allergy status to other drugs, medicaments and biological substances status: Secondary | ICD-10-CM

## 2017-12-17 DIAGNOSIS — M47816 Spondylosis without myelopathy or radiculopathy, lumbar region: Secondary | ICD-10-CM | POA: Diagnosis not present

## 2017-12-17 DIAGNOSIS — M5136 Other intervertebral disc degeneration, lumbar region: Secondary | ICD-10-CM | POA: Diagnosis present

## 2017-12-17 DIAGNOSIS — Z7951 Long term (current) use of inhaled steroids: Secondary | ICD-10-CM | POA: Diagnosis not present

## 2017-12-17 DIAGNOSIS — Z87891 Personal history of nicotine dependence: Secondary | ICD-10-CM

## 2017-12-17 DIAGNOSIS — M48062 Spinal stenosis, lumbar region with neurogenic claudication: Principal | ICD-10-CM | POA: Diagnosis present

## 2017-12-17 DIAGNOSIS — M4326 Fusion of spine, lumbar region: Secondary | ICD-10-CM | POA: Diagnosis not present

## 2017-12-17 DIAGNOSIS — Z79899 Other long term (current) drug therapy: Secondary | ICD-10-CM | POA: Diagnosis not present

## 2017-12-17 DIAGNOSIS — Z882 Allergy status to sulfonamides status: Secondary | ICD-10-CM

## 2017-12-17 DIAGNOSIS — G2581 Restless legs syndrome: Secondary | ICD-10-CM | POA: Diagnosis not present

## 2017-12-17 DIAGNOSIS — M4316 Spondylolisthesis, lumbar region: Secondary | ICD-10-CM | POA: Diagnosis present

## 2017-12-17 DIAGNOSIS — Z880 Allergy status to penicillin: Secondary | ICD-10-CM

## 2017-12-17 DIAGNOSIS — K219 Gastro-esophageal reflux disease without esophagitis: Secondary | ICD-10-CM | POA: Diagnosis present

## 2017-12-17 DIAGNOSIS — E039 Hypothyroidism, unspecified: Secondary | ICD-10-CM | POA: Diagnosis present

## 2017-12-17 DIAGNOSIS — Z419 Encounter for procedure for purposes other than remedying health state, unspecified: Secondary | ICD-10-CM

## 2017-12-17 LAB — BASIC METABOLIC PANEL
ANION GAP: 11 (ref 5–15)
BUN: 12 mg/dL (ref 6–20)
CHLORIDE: 101 mmol/L (ref 101–111)
CO2: 23 mmol/L (ref 22–32)
Calcium: 9 mg/dL (ref 8.9–10.3)
Creatinine, Ser: 0.58 mg/dL (ref 0.44–1.00)
GFR calc non Af Amer: >60 mL/min (ref >60)
GLUCOSE: 93 mg/dL (ref 65–99)
POTASSIUM: 4 mmol/L (ref 3.5–5.1)
Sodium: 135 mmol/L (ref 135–145)

## 2017-12-17 LAB — TYPE AND SCREEN
ABO/RH(D): A POS
Antibody Screen: NEGATIVE

## 2017-12-17 LAB — CBC
HEMATOCRIT: 42.1 % (ref 36.0–46.0)
HEMOGLOBIN: 13.6 g/dL (ref 12.0–15.0)
MCH: 29.1 pg (ref 26.0–34.0)
MCHC: 32.3 g/dL (ref 30.0–36.0)
MCV: 90 fL (ref 78.0–100.0)
Platelets: 271 10*3/uL (ref 150–400)
RBC: 4.68 MIL/uL (ref 3.87–5.11)
RDW: 14.6 % (ref 11.5–15.5)
WBC: 8.2 10*3/uL (ref 4.0–10.5)

## 2017-12-17 SURGERY — POSTERIOR LUMBAR FUSION 2 LEVEL
Anesthesia: General | Site: Back

## 2017-12-17 MED ORDER — KETOROLAC TROMETHAMINE 30 MG/ML IJ SOLN
15.0000 mg | Freq: Four times a day (QID) | INTRAMUSCULAR | Status: DC
  Administered 2017-12-17 – 2017-12-18 (×3): 15 mg via INTRAVENOUS
  Filled 2017-12-17 (×3): qty 1

## 2017-12-17 MED ORDER — PHENYLEPHRINE HCL 10 MG/ML IJ SOLN
INTRAVENOUS | Status: DC | PRN
  Administered 2017-12-17: 25 ug/min via INTRAVENOUS

## 2017-12-17 MED ORDER — ONDANSETRON HCL 4 MG/2ML IJ SOLN: 4.0000 mg | Freq: Four times a day (QID) | INTRAMUSCULAR | Status: DC | PRN

## 2017-12-17 MED ORDER — SUGAMMADEX SODIUM 200 MG/2ML IV SOLN
INTRAVENOUS | Status: AC
  Filled 2017-12-17: qty 2

## 2017-12-17 MED ORDER — VITAMIN D 1000 UNITS PO TABS: 2000.0000 [IU] | ORAL_TABLET | Freq: Every day | ORAL | Status: DC

## 2017-12-17 MED ORDER — ROPINIROLE HCL 1 MG PO TABS
2.0000 mg | ORAL_TABLET | Freq: Three times a day (TID) | ORAL | Status: DC
  Administered 2017-12-17 (×2): 2 mg via ORAL
  Filled 2017-12-17 (×2): qty 2

## 2017-12-17 MED ORDER — KETOROLAC TROMETHAMINE 30 MG/ML IJ SOLN: 15.0000 mg | Freq: Once | INTRAMUSCULAR | Status: DC

## 2017-12-17 MED ORDER — HYDROXYZINE HCL 25 MG PO TABS: 50.0000 mg | ORAL_TABLET | ORAL | Status: DC | PRN

## 2017-12-17 MED ORDER — LEVOTHYROXINE SODIUM 50 MCG PO TABS
50.0000 ug | ORAL_TABLET | Freq: Every day | ORAL | Status: DC
  Administered 2017-12-18: 50 ug via ORAL
  Filled 2017-12-17: qty 1

## 2017-12-17 MED ORDER — ONDANSETRON HCL 4 MG/2ML IJ SOLN
INTRAMUSCULAR | Status: AC
  Filled 2017-12-17: qty 2

## 2017-12-17 MED ORDER — SODIUM CHLORIDE 0.9 % IV SOLN: 250.0000 mL | INTRAVENOUS | Status: DC

## 2017-12-17 MED ORDER — MIDAZOLAM HCL 5 MG/5ML IJ SOLN
INTRAMUSCULAR | Status: DC | PRN
  Administered 2017-12-17: 1 mg via INTRAVENOUS

## 2017-12-17 MED ORDER — FENTANYL CITRATE (PF) 250 MCG/5ML IJ SOLN
INTRAMUSCULAR | Status: AC
  Filled 2017-12-17: qty 5

## 2017-12-17 MED ORDER — FLUTICASONE FUROATE-VILANTEROL 100-25 MCG/INH IN AEPB
1.0000 | INHALATION_SPRAY | Freq: Every day | RESPIRATORY_TRACT | Status: DC
  Administered 2017-12-18: 08:00:00 1 via RESPIRATORY_TRACT
  Filled 2017-12-17: qty 28

## 2017-12-17 MED ORDER — LIDOCAINE-EPINEPHRINE 1 %-1:100000 IJ SOLN
INTRAMUSCULAR | Status: DC | PRN
  Administered 2017-12-17: 20 mL

## 2017-12-17 MED ORDER — MENTHOL 3 MG MT LOZG: 1.0000 | LOZENGE | OROMUCOSAL | Status: DC | PRN

## 2017-12-17 MED ORDER — BUPIVACAINE HCL (PF) 0.5 % IJ SOLN
INTRAMUSCULAR | Status: DC | PRN
  Administered 2017-12-17: 20 mL

## 2017-12-17 MED ORDER — FENTANYL CITRATE (PF) 100 MCG/2ML IJ SOLN
25.0000 ug | INTRAMUSCULAR | Status: DC | PRN
  Administered 2017-12-17: 25 ug via INTRAVENOUS
  Administered 2017-12-17: 50 ug via INTRAVENOUS

## 2017-12-17 MED ORDER — FENTANYL CITRATE (PF) 100 MCG/2ML IJ SOLN
INTRAMUSCULAR | Status: DC | PRN
  Administered 2017-12-17: 50 ug via INTRAVENOUS
  Administered 2017-12-17: 150 ug via INTRAVENOUS
  Administered 2017-12-17: 50 ug via INTRAVENOUS

## 2017-12-17 MED ORDER — FENTANYL CITRATE (PF) 100 MCG/2ML IJ SOLN: 25.0000 ug | INTRAMUSCULAR | Status: DC | PRN

## 2017-12-17 MED ORDER — CEFAZOLIN SODIUM-DEXTROSE 2-4 GM/100ML-% IV SOLN
INTRAVENOUS | Status: AC
  Filled 2017-12-17: qty 100

## 2017-12-17 MED ORDER — FENTANYL CITRATE (PF) 100 MCG/2ML IJ SOLN
INTRAMUSCULAR | Status: AC
  Filled 2017-12-17: qty 2

## 2017-12-17 MED ORDER — TEMAZEPAM 15 MG PO CAPS
30.0000 mg | ORAL_CAPSULE | Freq: Every day | ORAL | Status: DC
  Administered 2017-12-17: 30 mg via ORAL
  Filled 2017-12-17: qty 2

## 2017-12-17 MED ORDER — ROCURONIUM BROMIDE 10 MG/ML (PF) SYRINGE
PREFILLED_SYRINGE | INTRAVENOUS | Status: AC
Start: 2017-12-17 — End: 2017-12-17
  Filled 2017-12-17: qty 5

## 2017-12-17 MED ORDER — MORPHINE SULFATE (PF) 4 MG/ML IV SOLN: 4.0000 mg | INTRAVENOUS | Status: DC | PRN

## 2017-12-17 MED ORDER — ACETAMINOPHEN 10 MG/ML IV SOLN
INTRAVENOUS | Status: AC
  Filled 2017-12-17: qty 100

## 2017-12-17 MED ORDER — CYCLOBENZAPRINE HCL 10 MG PO TABS
ORAL_TABLET | ORAL | Status: AC
  Filled 2017-12-17: qty 1

## 2017-12-17 MED ORDER — THROMBIN (RECOMBINANT) 5000 UNITS EX SOLR
OROMUCOSAL | Status: DC | PRN
  Administered 2017-12-17: 10:00:00 via TOPICAL

## 2017-12-17 MED ORDER — SODIUM CHLORIDE 0.9 % IR SOLN
Status: DC | PRN
  Administered 2017-12-17 (×2)

## 2017-12-17 MED ORDER — LIDOCAINE-EPINEPHRINE 1 %-1:100000 IJ SOLN
INTRAMUSCULAR | Status: AC
  Filled 2017-12-17: qty 1

## 2017-12-17 MED ORDER — HYDROCODONE-ACETAMINOPHEN 5-325 MG PO TABS
ORAL_TABLET | ORAL | Status: AC
  Filled 2017-12-17: qty 2

## 2017-12-17 MED ORDER — PHENOL 1.4 % MT LIQD: 1.0000 | OROMUCOSAL | Status: DC | PRN

## 2017-12-17 MED ORDER — PHENYLEPHRINE 40 MCG/ML (10ML) SYRINGE FOR IV PUSH (FOR BLOOD PRESSURE SUPPORT)
PREFILLED_SYRINGE | INTRAVENOUS | Status: AC
  Filled 2017-12-17: qty 10

## 2017-12-17 MED ORDER — ALBUMIN HUMAN 5 % IV SOLN
INTRAVENOUS | Status: DC | PRN
  Administered 2017-12-17: 11:00:00 via INTRAVENOUS

## 2017-12-17 MED ORDER — KETOROLAC TROMETHAMINE 15 MG/ML IJ SOLN
INTRAMUSCULAR | Status: AC
  Administered 2017-12-17: 15 mg
  Filled 2017-12-17: qty 1

## 2017-12-17 MED ORDER — THROMBIN 5000 UNITS EX SOLR
CUTANEOUS | Status: AC
  Filled 2017-12-17: qty 5000

## 2017-12-17 MED ORDER — SODIUM CHLORIDE 0.9% FLUSH: 3.0000 mL | INTRAVENOUS | Status: DC | PRN

## 2017-12-17 MED ORDER — ACETAMINOPHEN 10 MG/ML IV SOLN
1000.0000 mg | Freq: Once | INTRAVENOUS | Status: AC
  Administered 2017-12-17: 1000 mg via INTRAVENOUS

## 2017-12-17 MED ORDER — MIDAZOLAM HCL 2 MG/2ML IJ SOLN
INTRAMUSCULAR | Status: AC
  Filled 2017-12-17: qty 2

## 2017-12-17 MED ORDER — CYCLOBENZAPRINE HCL 5 MG PO TABS
5.0000 mg | ORAL_TABLET | Freq: Three times a day (TID) | ORAL | Status: DC | PRN
  Administered 2017-12-17: 10 mg via ORAL
  Administered 2017-12-17: 5 mg via ORAL
  Filled 2017-12-17 (×2): qty 2

## 2017-12-17 MED ORDER — HYDROXYZINE HCL 50 MG/ML IM SOLN: 50.0000 mg | INTRAMUSCULAR | Status: DC | PRN

## 2017-12-17 MED ORDER — THROMBIN (RECOMBINANT) 20000 UNITS EX SOLR
CUTANEOUS | Status: DC | PRN
  Administered 2017-12-17: 10:00:00 via TOPICAL

## 2017-12-17 MED ORDER — ALUM & MAG HYDROXIDE-SIMETH 200-200-20 MG/5ML PO SUSP: 30.0000 mL | Freq: Four times a day (QID) | ORAL | Status: DC | PRN

## 2017-12-17 MED ORDER — HYDROCODONE-ACETAMINOPHEN 5-325 MG PO TABS
1.0000 | ORAL_TABLET | ORAL | Status: DC | PRN
  Administered 2017-12-17 (×3): 2 via ORAL
  Administered 2017-12-18: 1 via ORAL
  Filled 2017-12-17 (×2): qty 2
  Filled 2017-12-17: qty 1

## 2017-12-17 MED ORDER — THROMBIN 20000 UNITS EX SOLR
CUTANEOUS | Status: AC
  Filled 2017-12-17: qty 20000

## 2017-12-17 MED ORDER — MAGNESIUM HYDROXIDE 400 MG/5ML PO SUSP: 30.0000 mL | Freq: Every day | ORAL | Status: DC | PRN

## 2017-12-17 MED ORDER — PROPOFOL 10 MG/ML IV BOLUS
INTRAVENOUS | Status: DC | PRN
  Administered 2017-12-17: 150 mg via INTRAVENOUS

## 2017-12-17 MED ORDER — LIDOCAINE 2% (20 MG/ML) 5 ML SYRINGE
INTRAMUSCULAR | Status: AC
  Filled 2017-12-17: qty 5

## 2017-12-17 MED ORDER — PROPOFOL 10 MG/ML IV BOLUS
INTRAVENOUS | Status: AC
  Filled 2017-12-17: qty 20

## 2017-12-17 MED ORDER — CEFAZOLIN SODIUM-DEXTROSE 2-3 GM-%(50ML) IV SOLR
INTRAVENOUS | Status: DC | PRN
Start: 2017-12-17 — End: 2017-12-17
  Administered 2017-12-17 (×2): 2 g via INTRAVENOUS

## 2017-12-17 MED ORDER — PANTOPRAZOLE SODIUM 40 MG PO TBEC: 80.0000 mg | DELAYED_RELEASE_TABLET | Freq: Every day | ORAL | Status: DC

## 2017-12-17 MED ORDER — FLUTICASONE PROPIONATE 50 MCG/ACT NA SUSP
1.0000 | Freq: Two times a day (BID) | NASAL | Status: DC
  Administered 2017-12-17: 1 via NASAL
  Filled 2017-12-17: qty 16

## 2017-12-17 MED ORDER — 0.9 % SODIUM CHLORIDE (POUR BTL) OPTIME
TOPICAL | Status: DC | PRN
  Administered 2017-12-17 (×3): 1000 mL

## 2017-12-17 MED ORDER — FLEET ENEMA 7-19 GM/118ML RE ENEM: 1.0000 | ENEMA | Freq: Once | RECTAL | Status: DC | PRN

## 2017-12-17 MED ORDER — CARVEDILOL 6.25 MG PO TABS
6.2500 mg | ORAL_TABLET | Freq: Two times a day (BID) | ORAL | Status: DC
  Administered 2017-12-17: 6.25 mg via ORAL
  Filled 2017-12-17: qty 1

## 2017-12-17 MED ORDER — ONDANSETRON HCL 4 MG/2ML IJ SOLN
INTRAMUSCULAR | Status: DC | PRN
  Administered 2017-12-17: 4 mg via INTRAVENOUS

## 2017-12-17 MED ORDER — SODIUM CHLORIDE 0.9% FLUSH
3.0000 mL | Freq: Two times a day (BID) | INTRAVENOUS | Status: DC
  Administered 2017-12-17: 3 mL via INTRAVENOUS

## 2017-12-17 MED ORDER — ACETAMINOPHEN 325 MG PO TABS: 650.0000 mg | ORAL_TABLET | ORAL | Status: DC | PRN

## 2017-12-17 MED ORDER — CEFAZOLIN SODIUM 1 G IJ SOLR
INTRAMUSCULAR | Status: AC
  Filled 2017-12-17: qty 20

## 2017-12-17 MED ORDER — LACTATED RINGERS IV SOLN
INTRAVENOUS | Status: DC | PRN
  Administered 2017-12-17 (×3): via INTRAVENOUS

## 2017-12-17 MED ORDER — BUPIVACAINE HCL (PF) 0.5 % IJ SOLN
INTRAMUSCULAR | Status: AC
  Filled 2017-12-17: qty 30

## 2017-12-17 MED ORDER — KCL IN DEXTROSE-NACL 20-5-0.45 MEQ/L-%-% IV SOLN: INTRAVENOUS | Status: DC

## 2017-12-17 MED ORDER — ACETAMINOPHEN 650 MG RE SUPP: 650.0000 mg | RECTAL | Status: DC | PRN

## 2017-12-17 MED ORDER — PHENYLEPHRINE HCL 10 MG/ML IJ SOLN
INTRAMUSCULAR | Status: DC | PRN
  Administered 2017-12-17: 80 ug via INTRAVENOUS
  Administered 2017-12-17 (×8): 40 ug via INTRAVENOUS

## 2017-12-17 MED ORDER — DEXAMETHASONE SODIUM PHOSPHATE 10 MG/ML IJ SOLN
INTRAMUSCULAR | Status: AC
Start: 2017-12-17 — End: 2017-12-17
  Filled 2017-12-17: qty 1

## 2017-12-17 MED ORDER — BISACODYL 10 MG RE SUPP: 10.0000 mg | Freq: Every day | RECTAL | Status: DC | PRN

## 2017-12-17 MED ORDER — ROCURONIUM BROMIDE 100 MG/10ML IV SOLN
INTRAVENOUS | Status: DC | PRN
  Administered 2017-12-17: 50 mg via INTRAVENOUS
  Administered 2017-12-17: 20 mg via INTRAVENOUS

## 2017-12-17 MED ORDER — SUGAMMADEX SODIUM 200 MG/2ML IV SOLN
INTRAVENOUS | Status: DC | PRN
  Administered 2017-12-17: 125 mg via INTRAVENOUS

## 2017-12-17 MED ORDER — ONDANSETRON HCL 4 MG PO TABS: 4.0000 mg | ORAL_TABLET | Freq: Four times a day (QID) | ORAL | Status: DC | PRN

## 2017-12-17 MED ORDER — AZELASTINE HCL 0.1 % NA SOLN
1.0000 | Freq: Two times a day (BID) | NASAL | Status: DC
  Administered 2017-12-17: 1 via NASAL
  Filled 2017-12-17: qty 30

## 2017-12-17 MED ORDER — DEXAMETHASONE SODIUM PHOSPHATE 10 MG/ML IJ SOLN
INTRAMUSCULAR | Status: DC | PRN
  Administered 2017-12-17: 10 mg via INTRAVENOUS

## 2017-12-17 MED ORDER — LACTATED RINGERS IV SOLN
INTRAVENOUS | Status: DC | PRN
  Administered 2017-12-17: 10:00:00 via INTRAVENOUS

## 2017-12-17 SURGICAL SUPPLY — 86 items
ADH SKN CLS APL DERMABOND .7 (GAUZE/BANDAGES/DRESSINGS) ×1
APL SKNCLS STERI-STRIP NONHPOA (GAUZE/BANDAGES/DRESSINGS)
BAG DECANTER FOR FLEXI CONT (MISCELLANEOUS) ×3 IMPLANT
BENZOIN TINCTURE PRP APPL 2/3 (GAUZE/BANDAGES/DRESSINGS) IMPLANT
BLADE CLIPPER SURG (BLADE) IMPLANT
BUR ACRON 5.0MM COATED (BURR) ×2 IMPLANT
BUR MATCHSTICK NEURO 3.0 LAGG (BURR) ×2 IMPLANT
CANISTER SUCT 3000ML PPV (MISCELLANEOUS) ×2 IMPLANT
CARTRIDGE OIL MAESTRO DRILL (MISCELLANEOUS) ×1 IMPLANT
CONT SPEC 4OZ CLIKSEAL STRL BL (MISCELLANEOUS) ×2 IMPLANT
COVER BACK TABLE 60X90IN (DRAPES) ×2 IMPLANT
DECANTER SPIKE VIAL GLASS SM (MISCELLANEOUS) ×2 IMPLANT
DERMABOND ADVANCED (GAUZE/BANDAGES/DRESSINGS) ×1
DERMABOND ADVANCED .7 DNX12 (GAUZE/BANDAGES/DRESSINGS) ×1 IMPLANT
DIFFUSER DRILL AIR PNEUMATIC (MISCELLANEOUS) ×2 IMPLANT
DRAPE C-ARM 42X72 X-RAY (DRAPES) ×4 IMPLANT
DRAPE C-ARMOR (DRAPES) IMPLANT
DRAPE HALF SHEET 40X57 (DRAPES) ×1 IMPLANT
DRAPE LAPAROTOMY 100X72X124 (DRAPES) ×2 IMPLANT
DRAPE POUCH INSTRU U-SHP 10X18 (DRAPES) ×1 IMPLANT
ELECT REM PT RETURN 9FT ADLT (ELECTROSURGICAL) ×2
ELECTRODE REM PT RTRN 9FT ADLT (ELECTROSURGICAL) ×1 IMPLANT
GAUZE SPONGE 4X4 12PLY STRL (GAUZE/BANDAGES/DRESSINGS) ×2 IMPLANT
GAUZE SPONGE 4X4 16PLY XRAY LF (GAUZE/BANDAGES/DRESSINGS) IMPLANT
GLOVE BIO SURGEON STRL SZ8 (GLOVE) ×1 IMPLANT
GLOVE BIOGEL PI IND STRL 6.5 (GLOVE) IMPLANT
GLOVE BIOGEL PI IND STRL 7.5 (GLOVE) IMPLANT
GLOVE BIOGEL PI IND STRL 8 (GLOVE) ×2 IMPLANT
GLOVE BIOGEL PI IND STRL 8.5 (GLOVE) IMPLANT
GLOVE BIOGEL PI INDICATOR 6.5 (GLOVE) ×2
GLOVE BIOGEL PI INDICATOR 7.5 (GLOVE) ×1
GLOVE BIOGEL PI INDICATOR 8 (GLOVE) ×2
GLOVE BIOGEL PI INDICATOR 8.5 (GLOVE) ×1
GLOVE ECLIPSE 7.5 STRL STRAW (GLOVE) ×4 IMPLANT
GLOVE SURG SS PI 6.5 STRL IVOR (GLOVE) ×6 IMPLANT
GLOVE SURG SS PI 7.0 STRL IVOR (GLOVE) ×1 IMPLANT
GOWN STRL REUS W/ TWL LRG LVL3 (GOWN DISPOSABLE) IMPLANT
GOWN STRL REUS W/ TWL XL LVL3 (GOWN DISPOSABLE) ×2 IMPLANT
GOWN STRL REUS W/TWL 2XL LVL3 (GOWN DISPOSABLE) IMPLANT
GOWN STRL REUS W/TWL LRG LVL3 (GOWN DISPOSABLE) ×6
GOWN STRL REUS W/TWL XL LVL3 (GOWN DISPOSABLE) ×6
HEMOSTAT POWDER KIT SURGIFOAM (HEMOSTASIS) ×1 IMPLANT
KIT BASIN OR (CUSTOM PROCEDURE TRAY) ×2 IMPLANT
KIT ROOM TURNOVER OR (KITS) ×2 IMPLANT
NDL 18GX1X1/2 (RX/OR ONLY) (NEEDLE) ×1 IMPLANT
NDL ASP BONE MRW 8GX15 (NEEDLE) IMPLANT
NDL SPNL 22GX3.5 QUINCKE BK (NEEDLE) ×1 IMPLANT
NEEDLE 18GX1X1/2 (RX/OR ONLY) (NEEDLE) IMPLANT
NEEDLE ASP BONE MRW 8GX15 (NEEDLE) ×2 IMPLANT
NEEDLE SPNL 18GX3.5 QUINCKE PK (NEEDLE) ×4 IMPLANT
NEEDLE SPNL 22GX3.5 QUINCKE BK (NEEDLE) ×2 IMPLANT
NS IRRIG 1000ML POUR BTL (IV SOLUTION) ×4 IMPLANT
OIL CARTRIDGE MAESTRO DRILL (MISCELLANEOUS) ×2
PACK LAMINECTOMY NEURO (CUSTOM PROCEDURE TRAY) ×2 IMPLANT
PAD ARMBOARD 7.5X6 YLW CONV (MISCELLANEOUS) ×6 IMPLANT
PATTIES SURGICAL .5 X.5 (GAUZE/BANDAGES/DRESSINGS) IMPLANT
PATTIES SURGICAL .5 X1 (DISPOSABLE) ×1 IMPLANT
PATTIES SURGICAL 1X1 (DISPOSABLE) ×1 IMPLANT
ROD CONNECT LAT 5.5/6.0 2SCR (Rod) ×1 IMPLANT
ROD TI CVD 05.5X100 (Rod) ×3 IMPLANT
ROD TI CVD 05.5X110 (Rod) IMPLANT
SCREW POLYAXIAL 5.5X35 (Screw) ×2 IMPLANT
SCREW POLYAXIAL 5.5X45 (Screw) ×2 IMPLANT
SPACER 8X25X11 POST 4D LORDOT (Spacer) ×2 IMPLANT
SPACER SPINAL 9X25X4-8D PEEK (Spacer) ×2 IMPLANT
SPONGE LAP 4X18 X RAY DECT (DISPOSABLE) IMPLANT
SPONGE NEURO XRAY DETECT 1X3 (DISPOSABLE) ×1 IMPLANT
SPONGE SURGIFOAM ABS GEL 100 (HEMOSTASIS) ×1 IMPLANT
STAPLER SKIN PROX WIDE 3.9 (STAPLE) IMPLANT
STRIP BIOACTIVE VITOSS 25X100X (Neuro Prosthesis/Implant) ×4 IMPLANT
STRIP CLOSURE SKIN 1/2X4 (GAUZE/BANDAGES/DRESSINGS) IMPLANT
SUT PROLENE 6 0 BV (SUTURE) IMPLANT
SUT VIC AB 1 CT1 18XBRD ANBCTR (SUTURE) ×2 IMPLANT
SUT VIC AB 1 CT1 8-18 (SUTURE) ×6
SUT VIC AB 2-0 CP2 18 (SUTURE) ×5 IMPLANT
SYR 3ML LL SCALE MARK (SYRINGE) IMPLANT
SYR 5ML LL (SYRINGE) IMPLANT
SYR CONTROL 10ML LL (SYRINGE) ×2 IMPLANT
TAPE CLOTH SURG 4X10 WHT LF (GAUZE/BANDAGES/DRESSINGS) ×1 IMPLANT
TOP CLOSURE TORQ LIMIT (Neuro Prosthesis/Implant) ×4 IMPLANT
TOP CLSR SEQUOIA (Orthopedic Implant) ×3 IMPLANT
TOWEL GREEN STERILE (TOWEL DISPOSABLE) ×2 IMPLANT
TOWEL GREEN STERILE FF (TOWEL DISPOSABLE) ×2 IMPLANT
TRAP SPECIMEN MUCOUS 40CC (MISCELLANEOUS) IMPLANT
TRAY FOLEY W/METER SILVER 16FR (SET/KITS/TRAYS/PACK) ×2 IMPLANT
WATER STERILE IRR 1000ML POUR (IV SOLUTION) ×2 IMPLANT

## 2017-12-17 NOTE — H&P (Signed)
Subjective: Patient is a 72 y.o.white female who is admitted for treatment of neurogenic claudication with low back pain extending into the buttocks bilaterally.patient has advanced degenerative disease and spondylosis and lumbar spine with stenosis and instability at the L2-3 and L3-4 levels. She's been treated with NSAIDs and spinal injections without relief.  She is status post previous L4-S1 lumbar decompression and arthrodesis by Dr. Hal Neer. She is admitted now for an L2-L4 decompressive lumbar laminectomy, bilateral L2-3 and L3-4 posterior lumbar interbody arthrodesis with interbody implants and bone graft, and bilateral L2-L4 posterior lateral arthrodesis with posterior instrumentation, that will be tied into her existing instrumentation from L4-S1 and bone graft.    Patient Active Problem List   Diagnosis Date Noted  . Palpitations 01/23/2015  . Fatigue 01/23/2015  . Spinal stenosis of lumbar region at multiple levels 05/19/2014  . Facial edema 12/21/2013  . LV dysfunction 09/28/2013  . Syncope 08/25/2013  . Hyperlipidemia 08/25/2013  . Pulmonary hypertension ([place]) 07/04/2013  . Other emphysema ([place]) 07/04/2013  . Septic shock(785.52) 06/21/2013  . Acute respiratory failure with hypoxia ([place]) 06/21/2013  . Bradycardia 06/21/2013  . Ex-smoker   . Imbalance 12/08/2012  . Hypothyroidism 12/07/2012  . Restless leg syndrome 12/07/2012  . GERD (gastroesophageal reflux disease) 12/07/2012  . Chronic pain 12/07/2012   Past Medical History:  Diagnosis Date  . Arthritis   . Back pain   . Cancer (HCC)    Skin  . COPD (chronic obstructive pulmonary disease) ([place])   . Dyspnea   . Dysrhythmia    bradycardia  . Ex-smoker    Smoked 1 PPD x 20 years, quit 1994  . GERD (gastroesophageal reflux disease)   . Hip pain   . Hyperlipidemia    statin intolerant  . Hyperthyroidism   . Hypotension   . Lumbar stenosis with neurogenic claudication   . LV dysfunction    ejection fraction  40-45% by 2-D echocardiogram  . Palpitations    PVCs on Holter monitoring  . Restless leg syndrome   . Syncope     Past Surgical History:  Procedure Laterality Date  . BACK SURGERY    . BLADDER SUSPENSION    . ESOPHAGEAL DILATION     X 2  . RIGHT ROTATOR CUFF REPAIR      Medications Prior to Admission  Medication Sig Dispense Refill Last Dose  . Alpha-Lipoic Acid 100 MG CAPS Take 100 mg by mouth daily.    Past Week at Unknown time  . azelastine (ASTELIN) 137 MCG/SPRAY nasal spray Place 1 spray into both nostrils 2 (two) times daily.    12/17/2017 at 0430  . carvedilol (COREG) 6.25 MG tablet Take 1 tablet (6.25 mg total) by mouth 2 (two) times daily with a meal. 120 tablet 4 12/17/2017 at 0430  . Cholecalciferol (VITAMIN D) 2000 units CAPS Take 2,000 Units by mouth daily.    Past Week at Unknown time  . Coenzyme Q10 (COQ-10) 100 MG CAPS Take 100 mg by mouth daily.    Past Week at Unknown time  . cyclobenzaprine (FLEXERIL) 10 MG tablet Take 10 mg by mouth daily.    12/16/2017 at Unknown time  . fluticasone (FLONASE) 50 MCG/ACT nasal spray Place 1 spray into both nostrils 2 (two) times daily.    12/17/2017 at 0430  . fluticasone furoate-vilanterol (BREO ELLIPTA) 100-25 MCG/INH AEPB Inhale 1 puff into the lungs daily.   12/17/2017 at 0430  . ketoconazole (NIZORAL) 2 % cream Apply 1 application topically daily as  needed for irritation.    Past Week at Unknown time  . levothyroxine (SYNTHROID, LEVOTHROID) 50 MCG tablet Take 50 mcg by mouth every evening.    Past Week at Unknown time  . Melatonin 5 MG TABS Take 5 mg by mouth at bedtime.    Past Week at Unknown time  . omeprazole (PRILOSEC) 40 MG capsule Take 40 mg by mouth every evening.   12/16/2017 at Unknown time  . rOPINIRole (REQUIP) 2 MG tablet Take 2 mg by mouth 3 (three) times daily.    12/17/2017 at 0430  . temazepam (RESTORIL) 30 MG capsule Take 30 mg by mouth at bedtime.   12/16/2017 at Unknown time   Allergies  Allergen Reactions  .  Amoxicillin Diarrhea and Other (See Comments)    Has patient had a PCN reaction causing immediate rash, facial/tongue/throat swelling, SOB or lightheadedness with hypotension: No Has patient had a PCN reaction causing severe rash involving mucus membranes or skin necrosis: No Has patient had a PCN reaction that required hospitalization: No Has patient had a PCN reaction occurring within the last 10 years: Yes If all of the above answers are "NO", then may proceed with Cephalosporin use.   . Ciprofloxacin Nausea And Vomiting  . Statins Other (See Comments)    CRAMPS  . Sulfa Antibiotics Diarrhea  . Sulfamethoxazole Swelling and Other (See Comments)    Stomatitis, Angioedema    Social History   Tobacco Use  . Smoking status: Former Smoker    Types: Cigarettes    Last attempt to quit: 08/25/1993    Years since quitting: 24.3  . Smokeless tobacco: Never Used  . Tobacco comment: Quit about 20 years before.  Substance Use Topics  . Alcohol use: Yes    Alcohol/week: 0.0 oz    Comment: occasionally    Family History  Adopted: Yes  Family history unknown: Yes     Review of Systems A comprehensive review of systems was negative.  Objective: Vital signs in last 24 hours: Temp:  [97.5 F (36.4 C)] 97.5 F (36.4 C) (02/21 0634) Pulse Rate:  [85] 85 (02/21 0634) Resp:  [20] 20 (02/21 0634) BP: (131)/(68) 131/68 (02/21 0635) SpO2:  [98 %] 98 % (02/21 0634) Weight:  [60.3 kg (133 lb)] 60.3 kg (133 lb) (02/21 4782)  EXAM:  Patient well-developed well-nourished white female in no acute distress. Lungs are clear to auscultation , the patient has symmetrical respiratory excursion. Heart has a regular rate and rhythm normal S1 and S2 no murmur.   Abdomen is soft nontender nondistended bowel sounds are present. Extremity examination shows no clubbing cyanosis or edema. Motor examination shows 5 over 5 strength in the lower extremities including the iliopsoas quadriceps dorsiflexor extensor  hallicus  longus and plantar flexor bilaterally. Sensation is intact to pinprick in the distal lower extremities. Reflexes are symmetrical bilaterally. No pathologic reflexes are present. Patient has a normal gait and stance.   Data Review:CBC    Component Value Date/Time   WBC 11.1 (H) 11/18/2017 1414   RBC 4.60 11/18/2017 1414   HGB 13.5 11/18/2017 1414   HCT 41.2 11/18/2017 1414   PLT 345 11/18/2017 1414   MCV 89.6 11/18/2017 1414   MCH 29.3 11/18/2017 1414   MCHC 32.8 11/18/2017 1414   RDW 14.5 11/18/2017 1414   LYMPHSABS 1.0 06/24/2013 0555   MONOABS 1.0 06/24/2013 0555   EOSABS 0.0 06/24/2013 0555   BASOSABS 0.0 06/24/2013 0555  BMET    Component Value Date/Time   NA 138 11/18/2017 1414   K 4.0 11/18/2017 1414   CL 104 11/18/2017 1414   CO2 24 11/18/2017 1414   GLUCOSE 114 (H) 11/18/2017 1414   BUN 12 11/18/2017 1414   CREATININE 0.53 11/18/2017 1414   CALCIUM 9.2 11/18/2017 1414   GFRNONAA >60 11/18/2017 1414   GFRAA >60 11/18/2017 1414     Assessment/Plan: Patient with advanced degeneration at L2-3 and L3-4 levels status post previous L4-S1 decompression and arthrodesis, and is admitted now for an L2-3 and L3-4 lumbar decompression and arthrodesis.  I've discussed with the patient the nature of his condition, the nature the surgical procedure, the typical length of surgery, hospital stay, and overall recuperation, the limitations postoperatively, and risks of surgery. I discussed risks including risks of infection, bleeding, possibly need for transfusion, the risk of nerve root dysfunction with pain, weakness, numbness, or paresthesias, the risk of dural tear and CSF leakage and possible need for further surgery, the risk of failure of the arthrodesis and possibly for further surgery, the risk of anesthetic complications including myocardial infarction, stroke, pneumonia, and death. We discussed the need for postoperative immobilization in a  lumbar brace. Understanding all this the patient does wish to proceed with surgery and is admitted for such.     Hosie Spangle, MD 12/17/2017 7:20 AM

## 2017-12-17 NOTE — Anesthesia Postprocedure Evaluation (Signed)
Anesthesia Post Note  Patient: [name]  Procedure(s) Performed: LUMBAR TWO- LUMBAR THREE, LUMBAR THREE- LUMBAR FOUR DECOMPRESSIVE LAMINECTOMY,FACETECTOMY AND FORAMINOTOMY; LUMBAR TWO- LUMBAR THREE, LUMBAR THREE- LUMBAR FOUR POSTERIOR LUMBAR INTERBODY FUSION, POSTERIOR LATERAL ARTHRODESIS (N/A Back)     Patient location during evaluation: PACU Anesthesia Type: General Level of consciousness: awake Pain management: pain level controlled Vital Signs Assessment: post-procedure vital signs reviewed and stable Respiratory status: spontaneous breathing Cardiovascular status: stable Anesthetic complications: no    Last Vitals:  Vitals:   12/17/17 1304 12/17/17 1310  BP: 103/66   Pulse: 99   Resp: 18   Temp: 36.7 C   SpO2: (!) 88% 92%    Last Pain:  Vitals:   12/17/17 1310  PainSc: 5                  Niko Jakel

## 2017-12-17 NOTE — Transfer of Care (Signed)
Immediate Anesthesia Transfer of Care Note  Patient: JERALD VILLALONA  Procedure(s) Performed: LUMBAR TWO- LUMBAR THREE, LUMBAR THREE- LUMBAR FOUR DECOMPRESSIVE LAMINECTOMY,FACETECTOMY AND FORAMINOTOMY; LUMBAR TWO- LUMBAR THREE, LUMBAR THREE- LUMBAR FOUR POSTERIOR LUMBAR INTERBODY FUSION, POSTERIOR LATERAL ARTHRODESIS (N/A Back)  Patient Location: PACU  Anesthesia Type:General  Level of Consciousness: oriented, drowsy and patient cooperative  Airway & Oxygen Therapy: Patient Spontanous Breathing and Patient connected to face mask oxygen  Post-op Assessment: Report given to RN and Post -op Vital signs reviewed and stable  Post vital signs: Reviewed  Last Vitals:  Vitals:   12/17/17 0634 12/17/17 0635  BP:  131/68  Pulse: 85   Resp: 20   Temp: (!) 36.4 C   SpO2: 98%     Last Pain: There were no vitals filed for this visit.    Patients Stated Pain Goal: 3 (76/16/07 3710)  Complications: No apparent anesthesia complications

## 2017-12-17 NOTE — Progress Notes (Signed)
Vitals:   12/17/17 1349 12/17/17 1400 12/17/17 1405 12/17/17 1451  BP: (!) 111/59  (!) 109/55 (!) 114/56  Pulse: 92 88 89 100  Resp: 12 16 12 16   Temp:  (!) 97.3 F (36.3 C)  97.9 F (36.6 C)  SpO2:   96% 98%  Weight:      Height:        CBC Recent Labs    12/17/17 0633  WBC 8.2  HGB 13.6  HCT 42.1  PLT 271   BMET Recent Labs    12/17/17 0633  NA 135  K 4.0  CL 101  CO2 23  GLUCOSE 93  BUN 12  CREATININE 0.58  CALCIUM 9.0    Patient resting comfortably in bed, has only been up to the bathroom so far. Dressing clean and dry. Foley to straight drainage. We'll plan on d/cing once up and ambulating.    Plan: Encouraged to ambulate.  Discussed post discharge instructions with patient and her husband. Continue to progress through postoperative recovery.  Hosie Spangle, MD 12/17/2017, 4:58 PM

## 2017-12-17 NOTE — Op Note (Signed)
12/17/2017  12:50 PM  PATIENT:  [name]  72 y.o. female  PRE-OPERATIVE DIAGNOSIS:  Multilevel, multifactorial lumbar stenosis with neurogenic claudication; lumbar spondylolisthesis; lumbar spondylosis; lumbar degenerative disc disease  POST-OPERATIVE DIAGNOSIS:  Multilevel, multifactorial lumbar stenosis with neurogenic claudication; lumbar spondylolisthesis; lumbar spondylosis; lumbar degenerative disc disease  PROCEDURE:  Procedure(s):  Bilateral L2, L3, and L4 decompressive lumbar laminectomy, bilateral L2-3 and L3-4 facetectomy, bilateral L2, L3, and L4 foraminotomies for decompression of central canal, lateral recess, and neural foraminal stenosis with decompression beyond that required for interbody arthrodesis; bilateral L2-3 and L3-4 posterior lumbar interbody arthrodesis with AVS peek interbody implants and Vitoss BA with bone marrow aspirate; bilateral L2-3 and L3-4 posterior lateral arthrodesis with segmental Zimmer posterior instrumentation and Vitoss BA with bone marrow aspirate  SURGEON:  Jovita Gamma, M.D.  ASSISTANTS: Erline Levine, M.D.  ANESTHESIA:   general  EBL:  Total I/O In: 1610 [I.V.:2200; IV Piggyback:250] Out: 1300 [Urine:1000; Blood:300]  BLOOD ADMINISTERED:none  CELL SAVER GIVEN: Cell Saver technician and nurse anesthetist felt that there was insufficient recovered blood (90 mL) to return it to the patient  COUNT: Correct per nursing staff  DICTATION: Patient was brought to the operating room placed under general endotracheal anesthesia. The patient was turned to prone position, the lumbar region was prepped with Betadine soap and solution and draped in a sterile fashion. The midline was infiltrated with local anesthesia with epinephrine. A localizing x-ray was taken and L1 to S1 levels were identified.  A midline incision was made, extending it through the previous lower lumbar midline incision. It was carried down through the subcutaneous tissue,  bipolar cautery and electrocautery were used to maintain hemostasis. Dissection was carried down to the lumbar fascia. The fascia was incised bilaterally and the paraspinal muscles were dissected with a spinous process and lamina of L2, L3, and L4 in a subperiosteal fashion. Another x-ray was taken for localization and the L2-3 and L3-4 levels was localized. Dissection was then carried out laterally over the facet complexes and the transverse processes of L2, L3, and L4 were exposed and decorticated. We exposed the existing Zimmer Pathfinder posterior instrumentation from L4-S1. It was cleaned of soft tissue and scar.   We then proceeded with the decompression. Inferior L2, complete L3, and superior L4 laminectomy was performed using double-action rongeurs, the high-speed drill, and Kerrison punches. The decompression was extended laterally, included bilateral facetectomies at L2-3 and L3-4. There was markedly thickened ligament of flavum at the L3-4 level, and more moderately thickened ligament of flavum at the L2-3 level. At L3-4 was moderately scarred down, and was carefully dissected from the thecal sac and removed the ligament of flavum was similarly removed at the L2-3 level. We then extended the decompression laterally, performing foraminotomies to decompress the stenotic compression of the exiting L2, L3, and L4 nerve roots. Once the decompression of the stenotic compression of the thecal sac and exiting nerve roots was completed we proceeded with the posterior lumbar interbody arthrodesis. The annulus at each level was incised bilaterally and the disc space entered. A thorough discectomy was performed using pituitary rongeurs and curettes. Once the discectomy was completed we began to prepare the endplate surfaces, removing the cartilaginous endplates surface. We then measured the height of the intervertebral disc space. We selected 9 x 25 x 4 AVS peek interbody implants for the L2-3 level, and 11 x 25 x  4 AVS peek interbody implants for the L3-4 level.  The C-arm fluoroscope was then draped  and brought in the field and we identified the pedicle entry points bilaterally at the L2 and L3 levels. Each of the 4 pedicles was probed, we aspirated bone marrow aspirate from the vertebral bodies, this was injected over two 10 cc strips of Vitoss BA. Then each of the pedicles was examined with the ball probe, good bony surfaces were found and no bony cuts were found. Each of the pedicles was then tapped with a 5.5 mm tap, again examined with the ball probe good threading was found and no bony cuts were found. We then placed 5.5 by 35 millimeter Zimmer Vitality screws bilaterally at the L2 level and 5.5 by 45 millimeter Zimmer Vitality screws bilaterally at the L3 level.  We then packed the AVS peek interbody implants with Vitoss BA with bone marrow aspirate, and then placed the first implant at the L2-3 level on the right side, carefully retracting the thecal sac and nerve root medially. We then went back to the left side and placed a second implant on the left side again retracting the thecal sac and nerve root medially.  Then at the L3-4 level, we placed the first implant on the right side, carefully retracting the thecal sac and nerve root medially. We then went back to the left side and packed the midline with additional Vitoss BA with bone marrow aspirate and then placed a second implant on the left side, again retracting the thecal sac and nerve root medially. Additional Vitoss BA with bone marrow aspirate was packed lateral to the implants.   We then packed the lateral gutter over the transverse processes of L2, L3, and L4, and the intertransverse space with Vitoss BA with bone marrow aspirate. On the left side we used a bilateral open connector, that was secured to the existing rod between the L5 and S1 screws. We then selected a 100 mm pre-lordosed rods which was placed within the L2 and L3 screws on the left  side and within the connector on the left side. Locking caps were placed for the left L2 and L3 screws, and the locking screws were tightened down for the connector. On the right side we were unable to place a connector on the rod because of a narrow space between the screw heads. We therefore unlocked and removed the locking caps and removed the rod, on the right side. We then selected a 110 mm pre-lordosed rod. It was positioned within all 5 screw heads including L2, L3, L4, L5, and S1. Corresponding locking caps for the vitality screws and the Pathfinder screws were placed and tightened down. Final tightening of all of the locking caps and locking screws was performed with the corresponding counter torque and final tightener. Final images were taken with the C-arm fluoroscope.  The wound had been irrigated multiple times during the procedure with saline solution and bacitracin solution, good hemostasis was established with a combination of bipolar cautery and Gelfoam with thrombin. The Gelfoam was removed, and a thin layer Surgifoam applied. Once good hemostasis was confirmed we proceeded with closure paraspinal muscles deep fascia and Scarpa's fascia were closed with interrupted undyed 1 Vicryl sutures the subcutaneous and subcuticular closed with interrupted inverted 2-0 undyed Vicryl sutures the skin edges were approximated with Dermabond. The wound was dressed with sterile gauze and Hypafix.  Following surgery the patient was turned back to the supine position to be reversed and the anesthetic extubated and transferred to the recovery room for further care.   PLAN OF CARE:  Admit to inpatient   PATIENT DISPOSITION:  PACU - hemodynamically stable.   Delay start of Pharmacological VTE agent (>24hrs) due to surgical blood loss or risk of bleeding:  yes

## 2017-12-17 NOTE — Anesthesia Procedure Notes (Signed)
Procedure Name: Intubation Date/Time: 12/17/2017 7:42 AM Performed by: Jenne Campus, CRNA Pre-anesthesia Checklist: Patient identified, Emergency Drugs available, Suction available and Patient being monitored Patient Re-evaluated:Patient Re-evaluated prior to induction Oxygen Delivery Method: Circle System Utilized Preoxygenation: Pre-oxygenation with 100% oxygen Induction Type: IV induction Ventilation: Mask ventilation without difficulty Laryngoscope Size: Miller and 2 Grade View: Grade I Tube type: Oral Tube size: 7.0 mm Number of attempts: 1 Airway Equipment and Method: Stylet and Oral airway Placement Confirmation: ETT inserted through vocal cords under direct vision,  positive ETCO2 and breath sounds checked- equal and bilateral Secured at: 21 cm Tube secured with: Tape Dental Injury: Teeth and Oropharynx as per pre-operative assessment

## 2017-12-17 NOTE — Anesthesia Preprocedure Evaluation (Signed)
Anesthesia Evaluation  Patient identified by MRN, date of birth, ID band Patient awake    Reviewed: Allergy & Precautions, NPO status , Patient's Chart, lab work & pertinent test results  Airway Mallampati: II  TM Distance: >3 FB     Dental   Pulmonary shortness of breath, COPD, former smoker,    breath sounds clear to auscultation       Cardiovascular + dysrhythmias  Rhythm:Regular Rate:Normal     Neuro/Psych    GI/Hepatic Neg liver ROS, GERD  ,  Endo/Other  Hypothyroidism Hyperthyroidism   Renal/GU negative Renal ROS     Musculoskeletal   Abdominal   Peds  Hematology   Anesthesia Other Findings   Reproductive/Obstetrics                             Anesthesia Physical Anesthesia Plan  ASA: III  Anesthesia Plan: General   Post-op Pain Management:    Induction: Intravenous  PONV Risk Score and Plan: 3 and Treatment may vary due to age or medical condition, Ondansetron, Dexamethasone and Midazolam  Airway Management Planned: Oral ETT  Additional Equipment:   Intra-op Plan:   Post-operative Plan: Possible Post-op intubation/ventilation  Informed Consent: I have reviewed the patients History and Physical, chart, labs and discussed the procedure including the risks, benefits and alternatives for the proposed anesthesia with the patient or authorized representative who has indicated his/her understanding and acceptance.   Dental advisory given  Plan Discussed with: CRNA and Anesthesiologist  Anesthesia Plan Comments:         Anesthesia Quick Evaluation

## 2017-12-18 MED ORDER — HYDROCODONE-ACETAMINOPHEN 5-325 MG PO TABS: 1.0000 | ORAL_TABLET | ORAL | 0 refills | Status: DC | PRN

## 2017-12-18 MED FILL — Thrombin For Soln 20000 Unit: CUTANEOUS | Qty: 1 | Status: AC

## 2017-12-18 MED FILL — Thrombin For Soln 5000 Unit: CUTANEOUS | Qty: 5000 | Status: AC

## 2017-12-18 NOTE — Progress Notes (Signed)
Patient alert and oriented, mae's well, voiding adequate amount of urine, swallowing without difficulty, no c/o pain at time of discharge. Patient discharged home with family. Script and discharged instructions given to patient. Patient and family stated understanding of instructions given. Patient has an appointment with Dr. Nudelman 

## 2017-12-18 NOTE — Discharge Summary (Signed)
Physician Discharge Summary  Patient ID: [name] MRN: [id number] DOB/AGE: 02-05-1946 72 y.o.  Admit date: 12/17/2017 Discharge date: 12/18/2017  Admission Diagnoses:  Multilevel, multifactorial lumbar stenosis with neurogenic claudication; lumbar spondylolisthesis; lumbar spondylosis; lumbar degenerative disc disease  Discharge Diagnoses:  Multilevel, multifactorial lumbar stenosis with neurogenic claudication; lumbar spondylolisthesis; lumbar spondylosis; lumbar degenerative disc disease  Active Problems:   Lumbar stenosis with neurogenic claudication   Discharged Condition: good  Hospital Course: Patient was admitted, underwent an L2-L4 decompressive lumbar laminectomy, L2-3 and L3-4 PLIF, and it L2-L4 PLA. Postoperatively she has done well. She is up and ambulate actively. She is voiding well. Her dressing was removed, and the incision is healing nicely. There is no erythema, swelling, or drainage. She is asking to be discharged to home. She's been given instructions regarding wound care and activities. I've asked to return for follow-up with me in 3 weeks.  Discharge Exam: Blood pressure 92/74, pulse 65, temperature 98.2 F (36.8 C), temperature source Oral, resp. rate 16, height 5\' 1"  (1.549 m), weight 60.3 kg (133 lb), SpO2 98 %.  Disposition: 01-Home or Self Care  Discharge Instructions    Discharge wound care:   Complete by:  As directed    Leave the wound open to air. Shower daily with the wound uncovered. Water and soapy water should run over the incision area. Do not wash directly on the incision for 2 weeks. Remove the glue after 2 weeks.   Driving Restrictions   Complete by:  As directed    No driving for 2 weeks. May ride in the car locally now. May begin to drive locally in 2 weeks.   Other Restrictions   Complete by:  As directed    Walk gradually increasing distances out in the fresh air at least twice a day. Walking additional 6 times inside the house,  gradually increasing distances, daily. No bending, lifting, or twisting. Perform activities between shoulder and waist height (that is at counter height when standing or table height when sitting).     Allergies as of 12/18/2017      Reactions   Amoxicillin Diarrhea, Other (See Comments)   Has patient had a PCN reaction causing immediate rash, facial/tongue/throat swelling, SOB or lightheadedness with hypotension: No Has patient had a PCN reaction causing severe rash involving mucus membranes or skin necrosis: No Has patient had a PCN reaction that required hospitalization: No Has patient had a PCN reaction occurring within the last 10 years: Yes If all of the above answers are "NO", then may proceed with Cephalosporin use.   Ciprofloxacin Nausea And Vomiting   Statins Other (See Comments)   CRAMPS   Sulfa Antibiotics Diarrhea   Sulfamethoxazole Swelling, Other (See Comments)   Stomatitis, Angioedema      Medication List    TAKE these medications   Alpha-Lipoic Acid 100 MG Caps Take 100 mg by mouth daily.   azelastine 0.1 % nasal spray Commonly known as:  ASTELIN Place 1 spray into both nostrils 2 (two) times daily.   BREO ELLIPTA 100-25 MCG/INH Aepb Generic drug:  fluticasone furoate-vilanterol Inhale 1 puff into the lungs daily.   carvedilol 6.25 MG tablet Commonly known as:  COREG Take 1 tablet (6.25 mg total) by mouth 2 (two) times daily with a meal.   CoQ-10 100 MG Caps Take 100 mg by mouth daily.   cyclobenzaprine 10 MG tablet Commonly known as:  FLEXERIL Take 10 mg by mouth daily.   fluticasone 50 MCG/ACT nasal  spray Commonly known as:  FLONASE Place 1 spray into both nostrils 2 (two) times daily.   HYDROcodone-acetaminophen 5-325 MG tablet Commonly known as:  NORCO/VICODIN Take 1-2 tablets by mouth every 4 (four) hours as needed (pain).   ketoconazole 2 % cream Commonly known as:  NIZORAL Apply 1 application topically daily as needed for irritation.    levothyroxine 50 MCG tablet Commonly known as:  SYNTHROID, LEVOTHROID Take 50 mcg by mouth every evening.   Melatonin 5 MG Tabs Take 5 mg by mouth at bedtime.   omeprazole 40 MG capsule Commonly known as:  PRILOSEC Take 40 mg by mouth every evening.   rOPINIRole 2 MG tablet Commonly known as:  REQUIP Take 2 mg by mouth 3 (three) times daily.   temazepam 30 MG capsule Commonly known as:  RESTORIL Take 30 mg by mouth at bedtime.   Vitamin D 2000 units Caps Take 2,000 Units by mouth daily.            Discharge Care Instructions  (From admission, onward)        Start     Ordered   12/18/17 0000  Discharge wound care:    Comments:  Leave the wound open to air. Shower daily with the wound uncovered. Water and soapy water should run over the incision area. Do not wash directly on the incision for 2 weeks. Remove the glue after 2 weeks.   12/18/17 [id number]       Signed: Hosie Spangle 12/18/2017, 8:07 AM

## 2017-12-21 MED FILL — Sodium Chloride IV Soln 0.9%: INTRAVENOUS | Qty: 2000 | Status: AC

## 2017-12-21 MED FILL — Heparin Sodium (Porcine) Inj 1000 Unit/ML: INTRAMUSCULAR | Qty: 30 | Status: AC

## 2017-12-23 DIAGNOSIS — R0600 Dyspnea, unspecified: Secondary | ICD-10-CM | POA: Diagnosis not present

## 2017-12-29 ENCOUNTER — Other Ambulatory Visit: Payer: Self-pay | Admitting: Cardiovascular Disease

## 2017-12-31 DIAGNOSIS — M4316 Spondylolisthesis, lumbar region: Secondary | ICD-10-CM | POA: Diagnosis not present

## 2018-01-04 ENCOUNTER — Encounter (HOSPITAL_COMMUNITY): Payer: Self-pay | Admitting: Neurosurgery

## 2018-03-08 DIAGNOSIS — Z981 Arthrodesis status: Secondary | ICD-10-CM | POA: Diagnosis not present

## 2018-04-26 DIAGNOSIS — D1801 Hemangioma of skin and subcutaneous tissue: Secondary | ICD-10-CM | POA: Diagnosis not present

## 2018-04-26 DIAGNOSIS — D2239 Melanocytic nevi of other parts of face: Secondary | ICD-10-CM | POA: Diagnosis not present

## 2018-04-26 DIAGNOSIS — D225 Melanocytic nevi of trunk: Secondary | ICD-10-CM | POA: Diagnosis not present

## 2018-04-26 DIAGNOSIS — Z85820 Personal history of malignant melanoma of skin: Secondary | ICD-10-CM | POA: Diagnosis not present

## 2018-04-26 DIAGNOSIS — D485 Neoplasm of uncertain behavior of skin: Secondary | ICD-10-CM | POA: Diagnosis not present

## 2018-05-27 DIAGNOSIS — Z0000 Encounter for general adult medical examination without abnormal findings: Secondary | ICD-10-CM | POA: Diagnosis not present

## 2018-06-08 DIAGNOSIS — Z981 Arthrodesis status: Secondary | ICD-10-CM | POA: Diagnosis not present

## 2018-06-08 DIAGNOSIS — Z6827 Body mass index (BMI) 27.0-27.9, adult: Secondary | ICD-10-CM | POA: Diagnosis not present

## 2018-06-08 DIAGNOSIS — M47816 Spondylosis without myelopathy or radiculopathy, lumbar region: Secondary | ICD-10-CM | POA: Diagnosis not present

## 2018-06-08 DIAGNOSIS — M5136 Other intervertebral disc degeneration, lumbar region: Secondary | ICD-10-CM | POA: Diagnosis not present

## 2018-07-28 DIAGNOSIS — Z23 Encounter for immunization: Secondary | ICD-10-CM | POA: Diagnosis not present

## 2018-08-18 DIAGNOSIS — J309 Allergic rhinitis, unspecified: Secondary | ICD-10-CM | POA: Diagnosis not present

## 2018-08-26 DIAGNOSIS — H40033 Anatomical narrow angle, bilateral: Secondary | ICD-10-CM | POA: Diagnosis not present

## 2018-08-26 DIAGNOSIS — H04123 Dry eye syndrome of bilateral lacrimal glands: Secondary | ICD-10-CM | POA: Diagnosis not present

## 2018-08-31 DIAGNOSIS — D2239 Melanocytic nevi of other parts of face: Secondary | ICD-10-CM | POA: Diagnosis not present

## 2018-08-31 DIAGNOSIS — C4401 Basal cell carcinoma of skin of lip: Secondary | ICD-10-CM | POA: Diagnosis not present

## 2018-08-31 DIAGNOSIS — D485 Neoplasm of uncertain behavior of skin: Secondary | ICD-10-CM | POA: Diagnosis not present

## 2018-10-14 DIAGNOSIS — L97521 Non-pressure chronic ulcer of other part of left foot limited to breakdown of skin: Secondary | ICD-10-CM | POA: Diagnosis not present

## 2018-10-14 DIAGNOSIS — L089 Local infection of the skin and subcutaneous tissue, unspecified: Secondary | ICD-10-CM | POA: Diagnosis not present

## 2018-10-14 DIAGNOSIS — T148XXA Other injury of unspecified body region, initial encounter: Secondary | ICD-10-CM | POA: Diagnosis not present

## 2018-10-28 DIAGNOSIS — L821 Other seborrheic keratosis: Secondary | ICD-10-CM | POA: Diagnosis not present

## 2018-10-28 DIAGNOSIS — D1801 Hemangioma of skin and subcutaneous tissue: Secondary | ICD-10-CM | POA: Diagnosis not present

## 2018-10-28 DIAGNOSIS — D2239 Melanocytic nevi of other parts of face: Secondary | ICD-10-CM | POA: Diagnosis not present

## 2018-10-28 DIAGNOSIS — L570 Actinic keratosis: Secondary | ICD-10-CM | POA: Diagnosis not present

## 2018-10-28 DIAGNOSIS — D225 Melanocytic nevi of trunk: Secondary | ICD-10-CM | POA: Diagnosis not present

## 2018-11-04 DIAGNOSIS — M2042 Other hammer toe(s) (acquired), left foot: Secondary | ICD-10-CM | POA: Diagnosis not present

## 2018-11-04 DIAGNOSIS — L97521 Non-pressure chronic ulcer of other part of left foot limited to breakdown of skin: Secondary | ICD-10-CM | POA: Diagnosis not present

## 2018-11-11 DIAGNOSIS — L97521 Non-pressure chronic ulcer of other part of left foot limited to breakdown of skin: Secondary | ICD-10-CM | POA: Diagnosis not present

## 2018-11-11 DIAGNOSIS — M2042 Other hammer toe(s) (acquired), left foot: Secondary | ICD-10-CM | POA: Diagnosis not present

## 2018-12-19 ENCOUNTER — Other Ambulatory Visit: Payer: Self-pay | Admitting: Cardiovascular Disease

## 2018-12-21 ENCOUNTER — Other Ambulatory Visit: Payer: Self-pay

## 2018-12-21 MED ORDER — CARVEDILOL 6.25 MG PO TABS: 6.2500 mg | ORAL_TABLET | Freq: Two times a day (BID) | ORAL | 0 refills | Status: DC

## 2018-12-21 NOTE — Telephone Encounter (Signed)
Rx(s) sent to pharmacy electronically.  

## 2019-01-06 DIAGNOSIS — M2042 Other hammer toe(s) (acquired), left foot: Secondary | ICD-10-CM | POA: Diagnosis not present

## 2019-01-06 DIAGNOSIS — M89372 Hypertrophy of bone, left ankle and foot: Secondary | ICD-10-CM | POA: Diagnosis not present

## 2019-01-06 DIAGNOSIS — L97521 Non-pressure chronic ulcer of other part of left foot limited to breakdown of skin: Secondary | ICD-10-CM | POA: Diagnosis not present

## 2019-01-11 DIAGNOSIS — M2042 Other hammer toe(s) (acquired), left foot: Secondary | ICD-10-CM | POA: Diagnosis not present

## 2019-01-13 DIAGNOSIS — M2042 Other hammer toe(s) (acquired), left foot: Secondary | ICD-10-CM | POA: Diagnosis not present

## 2019-01-13 DIAGNOSIS — M89372 Hypertrophy of bone, left ankle and foot: Secondary | ICD-10-CM | POA: Diagnosis not present

## 2019-01-18 ENCOUNTER — Other Ambulatory Visit: Payer: Self-pay | Admitting: Cardiovascular Disease

## 2019-01-22 ENCOUNTER — Other Ambulatory Visit: Payer: Self-pay | Admitting: Cardiovascular Disease

## 2019-02-22 ENCOUNTER — Other Ambulatory Visit: Payer: Self-pay | Admitting: Cardiovascular Disease

## 2019-02-22 NOTE — Telephone Encounter (Signed)
Carvedilol 6.25 mg refilled. 

## 2019-03-01 DIAGNOSIS — M2042 Other hammer toe(s) (acquired), left foot: Secondary | ICD-10-CM | POA: Diagnosis not present

## 2019-03-02 DIAGNOSIS — Z01812 Encounter for preprocedural laboratory examination: Secondary | ICD-10-CM | POA: Diagnosis not present

## 2019-03-02 DIAGNOSIS — Z1159 Encounter for screening for other viral diseases: Secondary | ICD-10-CM | POA: Diagnosis not present

## 2019-03-07 DIAGNOSIS — R05 Cough: Secondary | ICD-10-CM | POA: Diagnosis not present

## 2019-03-07 DIAGNOSIS — R410 Disorientation, unspecified: Secondary | ICD-10-CM | POA: Diagnosis not present

## 2019-03-07 DIAGNOSIS — M150 Primary generalized (osteo)arthritis: Secondary | ICD-10-CM | POA: Diagnosis not present

## 2019-03-07 DIAGNOSIS — I519 Heart disease, unspecified: Secondary | ICD-10-CM | POA: Diagnosis not present

## 2019-03-07 DIAGNOSIS — Z6826 Body mass index (BMI) 26.0-26.9, adult: Secondary | ICD-10-CM | POA: Diagnosis not present

## 2019-03-07 DIAGNOSIS — E663 Overweight: Secondary | ICD-10-CM | POA: Diagnosis not present

## 2019-03-07 DIAGNOSIS — M0609 Rheumatoid arthritis without rheumatoid factor, multiple sites: Secondary | ICD-10-CM | POA: Diagnosis not present

## 2019-03-07 DIAGNOSIS — L659 Nonscarring hair loss, unspecified: Secondary | ICD-10-CM | POA: Diagnosis not present

## 2019-03-07 DIAGNOSIS — I7300 Raynaud's syndrome without gangrene: Secondary | ICD-10-CM | POA: Diagnosis not present

## 2019-03-09 DIAGNOSIS — M2042 Other hammer toe(s) (acquired), left foot: Secondary | ICD-10-CM | POA: Diagnosis not present

## 2019-03-09 DIAGNOSIS — M89372 Hypertrophy of bone, left ankle and foot: Secondary | ICD-10-CM | POA: Diagnosis not present

## 2019-03-23 DIAGNOSIS — M961 Postlaminectomy syndrome, not elsewhere classified: Secondary | ICD-10-CM | POA: Diagnosis not present

## 2019-03-23 DIAGNOSIS — I10 Essential (primary) hypertension: Secondary | ICD-10-CM | POA: Diagnosis not present

## 2019-03-26 ENCOUNTER — Other Ambulatory Visit: Payer: Self-pay | Admitting: Cardiovascular Disease

## 2019-04-05 ENCOUNTER — Encounter: Payer: Self-pay | Admitting: Psychology

## 2019-05-09 DIAGNOSIS — I519 Heart disease, unspecified: Secondary | ICD-10-CM | POA: Diagnosis not present

## 2019-05-09 DIAGNOSIS — R410 Disorientation, unspecified: Secondary | ICD-10-CM | POA: Diagnosis not present

## 2019-05-09 DIAGNOSIS — I7300 Raynaud's syndrome without gangrene: Secondary | ICD-10-CM | POA: Diagnosis not present

## 2019-05-09 DIAGNOSIS — E663 Overweight: Secondary | ICD-10-CM | POA: Diagnosis not present

## 2019-05-09 DIAGNOSIS — M0609 Rheumatoid arthritis without rheumatoid factor, multiple sites: Secondary | ICD-10-CM | POA: Diagnosis not present

## 2019-05-09 DIAGNOSIS — M150 Primary generalized (osteo)arthritis: Secondary | ICD-10-CM | POA: Diagnosis not present

## 2019-05-09 DIAGNOSIS — L659 Nonscarring hair loss, unspecified: Secondary | ICD-10-CM | POA: Diagnosis not present

## 2019-05-09 DIAGNOSIS — Z6825 Body mass index (BMI) 25.0-25.9, adult: Secondary | ICD-10-CM | POA: Diagnosis not present

## 2019-05-31 ENCOUNTER — Encounter: Payer: Self-pay | Admitting: Psychology

## 2019-05-31 ENCOUNTER — Encounter

## 2019-05-31 ENCOUNTER — Other Ambulatory Visit: Payer: Self-pay

## 2019-05-31 ENCOUNTER — Encounter: Payer: PPO | Attending: Psychology | Admitting: Psychology

## 2019-05-31 DIAGNOSIS — Z79899 Other long term (current) drug therapy: Secondary | ICD-10-CM | POA: Insufficient documentation

## 2019-05-31 DIAGNOSIS — G894 Chronic pain syndrome: Secondary | ICD-10-CM | POA: Insufficient documentation

## 2019-05-31 DIAGNOSIS — R262 Difficulty in walking, not elsewhere classified: Secondary | ICD-10-CM | POA: Diagnosis not present

## 2019-05-31 DIAGNOSIS — R413 Other amnesia: Secondary | ICD-10-CM | POA: Diagnosis not present

## 2019-05-31 DIAGNOSIS — M48062 Spinal stenosis, lumbar region with neurogenic claudication: Secondary | ICD-10-CM | POA: Insufficient documentation

## 2019-05-31 NOTE — Progress Notes (Signed)
Neuropsychological Consultation   Patient:   [name]   DOB:   1946/03/08  MR Number:  [id number]  Location:  [facility] [place], [place] [id number] [place] [address] Dept: [phone]           Date of Service:   05/31/2019  Start Time:   8 AM End Time:   10 AM  Today's visit was an in person visit that consisted of a 1 hour face-to-face clinical interview with the patient as well as 1 hour for medical records review and report writing.  Provider/Observer:  Ilean Skill, Psy.D.       Clinical Neuropsychologist       Billing Code/Service: [id number], [phone]  Chief Complaint:    [name] is a 73 year old female referred by Dr. Maryjean Ka for a neuropsychological/psychological evaluation as part of the standard protocol for the work-up to consider spinal cord stimulator trialing and possible implantation.  The patient reported continued chronic pain symptoms with some mild memory changes that appear to be simply age-related.  The patient denies any significant depression or anxiety and there are no significant psychosocial stressors present.  Reason for Service:  [name] is a 72 year old female referred by Dr. Maryjean Ka for a neuropsychological/psychological evaluation as part of the standard protocol for the work-up to consider spinal cord stimulator trialing and possible implantation.  The patient reported continued chronic pain symptoms with some mild memory changes that appear to be simply age-related.  The patient denies any significant depression or anxiety and there are no significant psychosocial stressors present.  The patient reports that she had her first back surgery 3-1/2 to 4 years ago and then her second surgery was around 1-1/2 years ago.  The patient reports that the surgeries did help with her pain for a while but her pain keeps getting worse and she is  now being considered for spinal cord stimulator implantation.  The patient describes low back pain and difficulty walking particularly on an incline or even gentle grade.  The patient reports that her sleep is good as long she takes her temazepam, which she describes is very helpful.  The patient describes good appetite.  The patient does report that her short-term memory is not like it used to be but does not describe this as being a significant deterioration overall.  Current Status:  The patient describes chronic pain symptoms and worsening back pain.  The patient has had 2 prior back surgeries.  The patient denies any significant psychosocial stressors or symptoms of depression/anxiety and denies any significant past psychiatric history.  The patient does report that she has had other surgeries as well as sepsis in the past.  Reliability of Information: The information is derived from 1 hour face-to-face clinical interview the patient as well as review of available medical records.  Behavioral Observation: [name]  presents as a 73 y.o.-year-old Right Caucasian Female who appeared her stated age. her dress was Appropriate and she was Well Groomed and her manners were Appropriate to the situation.  her participation was indicative of Appropriate and Attentive behaviors.  There were any physical disabilities noted.  she displayed an appropriate level of cooperation and motivation.     Interactions:    Active Appropriate and Attentive  Attention:   within normal limits and attention span and concentration were age appropriate  Memory:   within normal limits; recent and remote  memory intact  Visuo-spatial:  not examined  Speech (Volume):  normal  Speech:   normal; normal  Thought Process:  Coherent and Relevant  Though Content:  WNL; not suicidal and not homicidal  Orientation:   person, place, time/date and  situation  Judgment:   Good  Planning:   Good  Affect:    Appropriate  Mood:    Euthymic  Insight:   Good  Intelligence:   normal  Marital Status/Living: The patient was born in [place] and adopted at 55 months old a couple in [place].  The patient currently lives with her husband and they have been married for 63 years.  The patient has 2 adult children and a 39 year old daughter and a 15 year old son.  Both of her kids are doing well and her grandchildren are also doing well.  Current Employment: The patient is retired.  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  Hobbies and interests that have included hiking and baking/cooking.  However, she has not been able to hike due to her pain recently.  Substance Use:  No concerns of substance abuse are reported.    Education:   Engineering geologist History:   Past Medical History:  Diagnosis Date  . Arthritis   . Back pain   . Cancer (HCC)    Skin  . COPD (chronic obstructive pulmonary disease) ([facility])   . Dyspnea   . Dysrhythmia    bradycardia  . Ex-smoker    Smoked 1 PPD x 20 years, quit 1994  . GERD (gastroesophageal reflux disease)   . Hip pain   . Hyperlipidemia    statin intolerant  . Hyperthyroidism   . Hypotension   . Lumbar stenosis with neurogenic claudication   . LV dysfunction    ejection fraction 40-45% by 2-D echocardiogram  . Palpitations    PVCs on Holter monitoring  . Restless leg syndrome   . Syncope         Abuse/Trauma History: The patient denies any history of traumatic or abusive experiences.  Psychiatric History:  The patient denies any prior psychiatric history.  Family Med/Psych History:  Family History  Adopted: Yes  Family history unknown: Yes    Risk of Suicide/Violence: virtually non-existent the patient denies any suicidal homicidal  ideation.  Impression/DX:  [name] is a 73 year old female referred by Dr. Maryjean Ka for a neuropsychological/psychological evaluation as part of the standard protocol for the work-up to consider spinal cord stimulator trialing and possible implantation.  The patient reported continued chronic pain symptoms with some mild memory changes that appear to be simply age-related.  The patient denies any significant depression or anxiety and there are no significant psychosocial stressors present.  The patient reports that she had her first back surgery 3-1/2 to 4 years ago and then her second surgery was around 1-1/2 years ago.  The patient reports that the surgeries did help with her pain for a while but her pain keeps getting worse and she is now being considered for spinal cord stimulator implantation.  The patient describes low back pain and difficulty walking particularly on an incline or even gentle grade.  The patient reports that her sleep is good as long she takes her temazepam, which she describes is very helpful.  The patient describes good appetite.  The patient does report that her short-term memory is not like it  used to be but does not describe this as being a significant deterioration overall.  The patient describes chronic pain symptoms and worsening back pain.  The patient has had 2 prior back surgeries.  The patient denies any significant psychosocial stressors or symptoms of depression/anxiety and denies any significant past psychiatric history.  The patient does report that she has had other surgeries as well as sepsis in the past.  The patient does report some short-term memory issues but during the clinical interview she did well on memory screening and on her mental status and cognition variables.  It does appear that the short-term memory issues are consistent with age-related changes.   Disposition/Plan:  The patient will complete objective psychological/neuropsychological  measures including the [place] multiphasic personality inventory as well as the pain patient profile.  I did some cognitive screening during this first visit the patient's memory and cognition appeared to be well within normal limits.  Diagnosis:    Chronic pain syndrome  Lumbar stenosis with neurogenic claudication  Memory change         Electronically Signed   _______________________ Ilean Skill, Psy.D.

## 2019-06-13 DIAGNOSIS — M150 Primary generalized (osteo)arthritis: Secondary | ICD-10-CM | POA: Diagnosis not present

## 2019-06-13 DIAGNOSIS — L659 Nonscarring hair loss, unspecified: Secondary | ICD-10-CM | POA: Diagnosis not present

## 2019-06-13 DIAGNOSIS — M25512 Pain in left shoulder: Secondary | ICD-10-CM | POA: Diagnosis not present

## 2019-06-13 DIAGNOSIS — R410 Disorientation, unspecified: Secondary | ICD-10-CM | POA: Diagnosis not present

## 2019-06-13 DIAGNOSIS — I519 Heart disease, unspecified: Secondary | ICD-10-CM | POA: Diagnosis not present

## 2019-06-13 DIAGNOSIS — Z6825 Body mass index (BMI) 25.0-25.9, adult: Secondary | ICD-10-CM | POA: Diagnosis not present

## 2019-06-13 DIAGNOSIS — E663 Overweight: Secondary | ICD-10-CM | POA: Diagnosis not present

## 2019-06-13 DIAGNOSIS — M0609 Rheumatoid arthritis without rheumatoid factor, multiple sites: Secondary | ICD-10-CM | POA: Diagnosis not present

## 2019-06-13 DIAGNOSIS — I7300 Raynaud's syndrome without gangrene: Secondary | ICD-10-CM | POA: Diagnosis not present

## 2019-06-22 ENCOUNTER — Other Ambulatory Visit: Payer: Self-pay | Admitting: Cardiovascular Disease

## 2019-06-23 ENCOUNTER — Encounter: Payer: Self-pay | Admitting: Psychology

## 2019-06-23 ENCOUNTER — Encounter (HOSPITAL_BASED_OUTPATIENT_CLINIC_OR_DEPARTMENT_OTHER): Payer: PPO | Admitting: Psychology

## 2019-06-23 ENCOUNTER — Other Ambulatory Visit: Payer: Self-pay

## 2019-06-23 DIAGNOSIS — M48062 Spinal stenosis, lumbar region with neurogenic claudication: Secondary | ICD-10-CM

## 2019-06-23 DIAGNOSIS — G894 Chronic pain syndrome: Secondary | ICD-10-CM | POA: Diagnosis not present

## 2019-06-23 DIAGNOSIS — R413 Other amnesia: Secondary | ICD-10-CM

## 2019-06-23 NOTE — Progress Notes (Signed)
Neuropsychological Evaluation  Patient:  [name]   DOB: 16-May-1946  MR Number: [id number]  Location: [facility] PHYSICAL MEDICINE AND REHABILITATION [name], [name] [place] [address] Dept: [phone]  Start: 3 PM End: 4 PM  Provider/Observer:     Edgardo Roys PsyD  Chief Complaint:      Chief Complaint  Patient presents with  . Pain    Reason For Service:      [name] is a 73 year old female referred by Dr. Maryjean Ka for a neuropsychological/psychological evaluation as part of the standard protocol for the work-up to consider spinal cord stimulator trialing and possible implantation.  The patient reported continued chronic pain symptoms with some mild memory changes that appear to be simply age-related.  The patient denies any significant depression or anxiety and there are no significant psychosocial stressors present.  The patient reports that she had her first back surgery 3-1/2 to 4 years ago and then her second surgery was around 1-1/2 years ago.  The patient reports that the surgeries did help with her pain for a while but her pain keeps getting worse and she is now being considered for spinal cord stimulator implantation.  The patient describes low back pain and difficulty walking particularly on an incline or even gentle grade.  The patient reports that her sleep is good as long she takes her temazepam, which she describes is very helpful.  The patient describes good appetite.  The patient does report that her short-term memory is not like it used to be but does not describe this as being a significant deterioration overall.  Testing Administered:  The patient completed the [place] multiphasic personality inventory as well as the pain patient profile (P3.  Participation Level:   Active  Participation Quality:  Appropriate and Attentive      Behavioral  Observation:  Well Groomed, Alert, and Appropriate.   Test Results:   Initially, the patient completed the [place] multiphasic personality inventory.  The resulting validity scales suggest that the patient approach this measure in an honest and straightforward manner neither attempting to maximize or minimize any current symptomatology.  This does appear to be a fair and valid completion.  The resulting basic/clinical scales show no primary clinical scales in the clinically significant range with the exception of mild elevations on measures of physical difficulties and pain symptoms.  There were no indications of significant depression, anxiety, anger, or more significant psychiatric symptoms such as mood disorder, paranoia or delusions/hallucinations.  Further analysis utilizing content scales do show an elevation with regard to health concerns and health difficulties but all other measures such as anxiety obsessive thinking depression anger etc. were all well below clinically significant levels.  Supplementary scales do not shows any significant elevation on measures that have been shown to be sensitive to alcohol/substance abuse.  The patient also has no elevations on either of the PTSD/chronic trauma scales.  The patient did have elevations with regard to complaints of physical malfunction and psychomotor retardation due to pain and physical difficulties.  The patient then completed the pain patient profile (P3) to better assess symptoms that can be associated with chronic pain conditions that can inadvertently elevate certain scales on the MMPI.  The P3 inventory has normative samples of both a community-based sample as well as a chronic pain patient sample population.  The patient produced a valid profile with no indications of exaggeration or minimization of  current symptomatology.  While the patient had a elevation in somatizations/somatic complaints relative to a community-based normative sample  this elevation was not identified as elevated relative to a chronic pain patient nonpsychiatric population and the patient's physical complaints described on this measure were consistent with her medical history.  The patient showed no elevation for symptoms of anxiety or depression when compared to either a chronic pain patient population or a normative community-based sample.   Impression/Diagnosis:   Overall, the results of the current neuropsychological/psychological evaluation do suggest that [name] is an excellent candidate for spinal cord stimulator trialing and possible implantation relative to psychological and psychosocial variables.  The patient denies any psychosocial variables that would have a negative acute impact on her during either the trialing phase or possible future complete implantation.  The patient also has no significant findings or indications of psychiatric/psychological issues which would make it difficult for her to give an accurate description of potential benefits and changes from or achieved during the trialing phase.  While the patient describes some memory related changes mental status exam did not suggest that any of the memory issues she described were beyond what would be seen with typical age-related memory changes.  The patient's cognitive abilities, executive functioning, memory functions all appear to be doing quite well and there were no indications of cognitive deficits that would have a negative deleterious effect on her ability to participate in an active trialing phase.  There are no indications of significant depression, anxiety, or other significant psychiatric issues and no indications of any significant psychosocial issues that would complicate the trialing or possible implantation phases.   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 Neuropsychologist

## 2019-06-27 DIAGNOSIS — M19012 Primary osteoarthritis, left shoulder: Secondary | ICD-10-CM | POA: Diagnosis not present

## 2019-06-27 DIAGNOSIS — Z87891 Personal history of nicotine dependence: Secondary | ICD-10-CM | POA: Diagnosis not present

## 2019-06-30 DIAGNOSIS — M25512 Pain in left shoulder: Secondary | ICD-10-CM | POA: Diagnosis not present

## 2019-07-06 DIAGNOSIS — M25512 Pain in left shoulder: Secondary | ICD-10-CM | POA: Diagnosis not present

## 2019-07-06 DIAGNOSIS — R600 Localized edema: Secondary | ICD-10-CM | POA: Diagnosis not present

## 2019-07-10 ENCOUNTER — Other Ambulatory Visit: Payer: Self-pay | Admitting: Cardiovascular Disease

## 2019-07-20 DIAGNOSIS — M25512 Pain in left shoulder: Secondary | ICD-10-CM | POA: Diagnosis not present

## 2019-07-21 DIAGNOSIS — M961 Postlaminectomy syndrome, not elsewhere classified: Secondary | ICD-10-CM | POA: Diagnosis not present

## 2019-07-25 ENCOUNTER — Other Ambulatory Visit: Payer: Self-pay | Admitting: Cardiovascular Disease

## 2019-07-28 DIAGNOSIS — M961 Postlaminectomy syndrome, not elsewhere classified: Secondary | ICD-10-CM | POA: Diagnosis not present

## 2019-08-02 DIAGNOSIS — M25512 Pain in left shoulder: Secondary | ICD-10-CM | POA: Diagnosis not present

## 2019-08-02 DIAGNOSIS — M25612 Stiffness of left shoulder, not elsewhere classified: Secondary | ICD-10-CM | POA: Diagnosis not present

## 2019-08-02 DIAGNOSIS — M6281 Muscle weakness (generalized): Secondary | ICD-10-CM | POA: Diagnosis not present

## 2019-08-03 ENCOUNTER — Other Ambulatory Visit: Payer: Self-pay | Admitting: Anesthesiology

## 2019-08-09 DIAGNOSIS — M6281 Muscle weakness (generalized): Secondary | ICD-10-CM | POA: Diagnosis not present

## 2019-08-09 DIAGNOSIS — M25612 Stiffness of left shoulder, not elsewhere classified: Secondary | ICD-10-CM | POA: Diagnosis not present

## 2019-08-09 DIAGNOSIS — M25512 Pain in left shoulder: Secondary | ICD-10-CM | POA: Diagnosis not present

## 2019-08-11 DIAGNOSIS — S065X9A Traumatic subdural hemorrhage with loss of consciousness of unspecified duration, initial encounter: Secondary | ICD-10-CM | POA: Diagnosis not present

## 2019-08-11 DIAGNOSIS — S199XXA Unspecified injury of neck, initial encounter: Secondary | ICD-10-CM | POA: Diagnosis not present

## 2019-08-11 DIAGNOSIS — S065X0A Traumatic subdural hemorrhage without loss of consciousness, initial encounter: Secondary | ICD-10-CM | POA: Diagnosis not present

## 2019-08-11 DIAGNOSIS — S0101XA Laceration without foreign body of scalp, initial encounter: Secondary | ICD-10-CM | POA: Diagnosis not present

## 2019-08-15 ENCOUNTER — Telehealth: Payer: Self-pay | Admitting: Cardiovascular Disease

## 2019-08-15 ENCOUNTER — Other Ambulatory Visit: Payer: Self-pay

## 2019-08-15 MED ORDER — CARVEDILOL 6.25 MG PO TABS: ORAL_TABLET | ORAL | 0 refills | Status: DC

## 2019-08-15 NOTE — Telephone Encounter (Signed)
°*  STAT* If patient is at the pharmacy, call can be transferred to refill team.   1. Which medications need to be refilled? (please list name of each medication and dose if known) Carvedilol 6.25 mg  2. Which pharmacy/location (including street and city if local pharmacy) is medication to be sent to?[place] [phone]  3. Do they need a 30 day or 90 day supply? 30 would like 90

## 2019-08-18 DIAGNOSIS — M6281 Muscle weakness (generalized): Secondary | ICD-10-CM | POA: Diagnosis not present

## 2019-08-18 DIAGNOSIS — M25512 Pain in left shoulder: Secondary | ICD-10-CM | POA: Diagnosis not present

## 2019-08-18 DIAGNOSIS — M25612 Stiffness of left shoulder, not elsewhere classified: Secondary | ICD-10-CM | POA: Diagnosis not present

## 2019-08-19 DIAGNOSIS — S0101XD Laceration without foreign body of scalp, subsequent encounter: Secondary | ICD-10-CM | POA: Diagnosis not present

## 2019-08-19 DIAGNOSIS — S065X9D Traumatic subdural hemorrhage with loss of consciousness of unspecified duration, subsequent encounter: Secondary | ICD-10-CM | POA: Diagnosis not present

## 2019-08-23 ENCOUNTER — Other Ambulatory Visit (HOSPITAL_COMMUNITY)
Admission: RE | Admit: 2019-08-23 | Discharge: 2019-08-23 | Disposition: A | Discharge status: Home / Self Care | Payer: PPO | Source: Ambulatory Visit | Attending: Anesthesiology | Admitting: Anesthesiology

## 2019-08-23 DIAGNOSIS — Z20828 Contact with and (suspected) exposure to other viral communicable diseases: Secondary | ICD-10-CM | POA: Diagnosis not present

## 2019-08-23 DIAGNOSIS — Z01812 Encounter for preprocedural laboratory examination: Secondary | ICD-10-CM | POA: Diagnosis not present

## 2019-08-23 DIAGNOSIS — M25512 Pain in left shoulder: Secondary | ICD-10-CM | POA: Diagnosis not present

## 2019-08-23 DIAGNOSIS — M6281 Muscle weakness (generalized): Secondary | ICD-10-CM | POA: Diagnosis not present

## 2019-08-23 DIAGNOSIS — M25612 Stiffness of left shoulder, not elsewhere classified: Secondary | ICD-10-CM | POA: Diagnosis not present

## 2019-08-24 LAB — NOVEL CORONAVIRUS, NAA (HOSP ORDER, SEND-OUT TO REF LAB; TAT 18-24 HRS): SARS-CoV-2, NAA: NOT DETECTED

## 2019-08-25 ENCOUNTER — Encounter (HOSPITAL_COMMUNITY): Payer: Self-pay | Admitting: *Deleted

## 2019-08-25 ENCOUNTER — Other Ambulatory Visit: Payer: Self-pay

## 2019-08-25 DIAGNOSIS — M25512 Pain in left shoulder: Secondary | ICD-10-CM | POA: Diagnosis not present

## 2019-08-25 DIAGNOSIS — M6281 Muscle weakness (generalized): Secondary | ICD-10-CM | POA: Diagnosis not present

## 2019-08-25 DIAGNOSIS — M25612 Stiffness of left shoulder, not elsewhere classified: Secondary | ICD-10-CM | POA: Diagnosis not present

## 2019-08-25 NOTE — Anesthesia Preprocedure Evaluation (Addendum)
Anesthesia Evaluation  Patient identified by MRN, date of birth, ID band Patient awake    Reviewed: Allergy & Precautions, NPO status , Patient's Chart, lab work & pertinent test results, reviewed documented beta blocker date and time   History of Anesthesia Complications Negative for: history of anesthetic complications  Airway Mallampati: II  TM Distance: >3 FB Neck ROM: Full    Dental  (+) Dental Advisory Given, Upper Dentures   Pulmonary COPD,  COPD inhaler, former smoker,    Pulmonary exam normal        Cardiovascular (-) anginaNormal cardiovascular exam+ dysrhythmias      Neuro/Psych  RLS Lumbar spinal stenosis  negative psych ROS   GI/Hepatic Neg liver ROS, GERD  Controlled,  Endo/Other  Hypothyroidism   Renal/GU negative Renal ROS     Musculoskeletal  (+) Arthritis ,   Abdominal   Peds  Hematology negative hematology ROS (+)   Anesthesia Other Findings   Reproductive/Obstetrics                            Anesthesia Physical Anesthesia Plan  ASA: III  Anesthesia Plan: General   Post-op Pain Management:    Induction: Intravenous  PONV Risk Score and Plan: 3 and Treatment may vary due to age or medical condition, Ondansetron and Dexamethasone  Airway Management Planned: Oral ETT  Additional Equipment: None  Intra-op Plan:   Post-operative Plan: Extubation in OR  Informed Consent: I have reviewed the patients History and Physical, chart, labs and discussed the procedure including the risks, benefits and alternatives for the proposed anesthesia with the patient or authorized representative who has indicated his/her understanding and acceptance.     Dental advisory given  Plan Discussed with: CRNA and Anesthesiologist  Anesthesia Plan Comments:       Anesthesia Quick Evaluation

## 2019-08-25 NOTE — Progress Notes (Signed)
Pt denies SOB and chest pain. Pt stated that she is under the care of Dr. Gwenlyn Found, Cardiology and Dr. Teressa Lower, PCP. Pt denies having a cardiac cath but stated that a stress test was performed > 10 years ago. Pt denies having a chest x ray and EKG in the last year. Pt denies recent labs. Pt made aware to stop taking Aspirin, vitamins, fish oil, kril oil Alpha Lipoic Acid, Co Q 10, Melatonin, Lysine and herbal medications. Do not take any NSAIDs ie: Ibuprofen, Advil, Naproxen/Naprosyn (Aleve) , Motrin, BC and Goody Powder. Pt verbalized understanding of all pre-op instructions.

## 2019-08-25 NOTE — H&P (Signed)
[name] is an 73 y.o. female.   Chief Complaint: Back pain with radiation into the legs HPI: Patient had previous L4-S1 fusion with Dr. Hal Neer but developed mid lumbar degeneration and stenosis that did not respond to conservative measures.  She subsequently returned to see Dr. Sherwood Gambler, and underwent extension of her decompression and fusion from L2 to sacrum in February of 2019. Patient reports that she has had some degree of improvement in her back pain, and some degree of improvement in her radicular symptoms, but still continues to report stiffness, achiness, pain in her low back, with burning, stinging, or shooting radiating symptoms into her buttocks, thighs, and occasionally down into her calves.  She notes no distinct modifying or alleviating factors.  She still distinctly slowed down by her pain.  She has been unable to do some of the activities that would give her greater quality of life.  She attended a spinal cord stimulator information all session in March, and is interested in pursuing that avenue as a way to improve her pain control, and quality of life.   She subsequently underwent SCS trial, with excellent results, greater than 60% improvement in her back and leg  Symptoms.  She now presents for permanent implantation.  Past Medical History:  Diagnosis Date  . Arthritis   . Back pain   . Cancer (HCC)    Skin  . COPD (chronic obstructive pulmonary disease) ([place])   . Dyspnea   . Dysrhythmia    bradycardia  . Ex-smoker    Smoked 1 PPD x 20 years, quit 1994  . GERD (gastroesophageal reflux disease)   . Hip pain   . Hyperlipidemia    statin intolerant  . Hyperthyroidism   . Hypotension   . Lumbar stenosis with neurogenic claudication   . LV dysfunction    ejection fraction 40-45% by 2-D echocardiogram  . Palpitations    PVCs on Holter monitoring  . Restless leg syndrome   . Syncope     Past Surgical History:  Procedure Laterality Date  . BACK SURGERY    .  BLADDER SUSPENSION    . ESOPHAGEAL DILATION     X 2  . RIGHT ROTATOR CUFF REPAIR      Family History  Adopted: Yes  Family history unknown: Yes   Social History:  reports that she quit smoking about 26 years ago. Her smoking use included cigarettes. She has never used smokeless tobacco. She reports current alcohol use. She reports that she does not use drugs.  Allergies:  Allergies  Allergen Reactions  . Adhesive [Tape] Itching  . Amoxicillin Diarrhea and Other (See Comments)    Did it involve swelling of the face/tongue/throat, SOB, or low BP? No Did it involve sudden or severe rash/hives, skin peeling, or any reaction on the inside of your mouth or nose? No Did you need to seek medical attention at a hospital or doctor's office? No When did it last happen?past year or two If all above answers are "NO", may proceed with cephalosporin use.   . Ciprofloxacin Nausea And Vomiting  . Statins Other (See Comments)    CRAMPS  . Sulfa Antibiotics Diarrhea  . Sulfamethoxazole Swelling and Other (See Comments)    Stomatitis, Angioedema    Medications Prior to Admission  Medication Sig Dispense Refill  . Alpha-Lipoic Acid 100 MG CAPS Take 100 mg by mouth daily.     Marland Kitchen azelastine (ASTELIN) 137 MCG/SPRAY nasal spray Place 1 spray into both nostrils 2 (  two) times daily.     . carvedilol (COREG) 6.25 MG tablet TAKE 1 TABLET BY MOUTH TWICE DAILY WITH FOOD (Patient taking differently: Take 6.25 mg by mouth 2 (two) times daily. ) 30 tablet 0  . cholecalciferol (VITAMIN D3) 25 MCG (1000 UT) tablet Take 1,000 Units by mouth daily.    . cyclobenzaprine (FLEXERIL) 10 MG tablet Take 10 mg by mouth 2 (two) times daily.     . diclofenac sodium (VOLTAREN) 1 % GEL Apply 2 g topically 4 (four) times daily as needed (pain).    . fluticasone (FLONASE) 50 MCG/ACT nasal spray Place 1 spray into both nostrils 2 (two) times daily.     . fluticasone furoate-vilanterol (BREO ELLIPTA) 100-25 MCG/INH AEPB  Inhale 1 puff into the lungs daily.    . folic acid (FOLVITE) Q000111Q MCG tablet Take 800 mcg by mouth daily.    . hydroxychloroquine (PLAQUENIL) 200 MG tablet Take 200 mg by mouth daily.    Marland Kitchen ketoconazole (NIZORAL) 2 % cream Apply 1 application topically daily as needed for irritation.     Marland Kitchen levothyroxine (SYNTHROID, LEVOTHROID) 50 MCG tablet Take 50 mcg by mouth every evening.     Marland Kitchen LYSINE PO Take 1,000 mg by mouth daily.    . Melatonin 5 MG TABS Take 5 mg by mouth at bedtime.     . naproxen (NAPROSYN) 500 MG tablet Take 500 mg by mouth 2 (two) times daily with a meal.    . omeprazole (PRILOSEC) 40 MG capsule Take 40 mg by mouth every evening.    Marland Kitchen rOPINIRole (REQUIP) 2 MG tablet Take 2 mg by mouth 3 (three) times daily.     . temazepam (RESTORIL) 30 MG capsule Take 30 mg by mouth at bedtime.    Marland Kitchen acetaminophen (TYLENOL) 500 MG tablet Take 500 mg by mouth 2 (two) times daily as needed for moderate pain or headache.    Javier Docker Oil 500 MG CAPS Take 500 mg by mouth daily.      Results for orders placed or performed during the hospital encounter of 08/26/19 (from the past 48 hour(s))  CBC     Status: None   Collection Time: 08/26/19  6:13 AM  Result Value Ref Range   WBC 5.3 4.0 - 10.5 K/uL   RBC 4.06 3.87 - 5.11 MIL/uL   Hemoglobin 12.7 12.0 - 15.0 g/dL   HCT 37.9 36.0 - 46.0 %   MCV 93.3 80.0 - 100.0 fL   MCH 31.3 26.0 - 34.0 pg   MCHC 33.5 30.0 - 36.0 g/dL   RDW 13.4 11.5 - 15.5 %   Platelets 291 150 - 400 K/uL   nRBC 0.0 0.0 - 0.2 %    Comment: Performed at [facility], [place] [address] [place], [place] [address]   No results found.  Review of Systems  Constitutional: Negative.   HENT: Negative.   Eyes: Negative.   Respiratory: Negative.   Cardiovascular: Negative.   Gastrointestinal: Negative.   Genitourinary: Negative.   Musculoskeletal: Positive for back pain. Negative for falls and myalgias.  Skin: Negative.   Neurological: Negative.   Endo/Heme/Allergies:  Negative.   Psychiatric/Behavioral: Negative.     Blood pressure (!) 126/55, pulse 88, temperature 97.9 F (36.6 C), temperature source Oral, resp. rate 18, height 5\' 1"  (1.549 m), weight 55.3 kg, SpO2 98 %. Physical Exam  Constitutional: She is oriented to person, place, and time. She appears well-developed and well-nourished.  HENT:  Head: Normocephalic  and atraumatic.  Eyes: Pupils are equal, round, and reactive to light. EOM are normal.  Neck: Normal range of motion.  Cardiovascular: Normal rate.  Respiratory: Effort normal.  GI: Soft.  Musculoskeletal: Normal range of motion.  Neurological: She is alert and oriented to person, place, and time.  Skin: Skin is warm and dry.  Psychiatric: She has a normal mood and affect. Her behavior is normal. Thought content normal.     Assessment/Plan   Lumbar post-laminectomy syndrome  Chronic pain syndrome  Plan: Permanent implantation SCS, [employer]  Bonna Gains, MD 08/26/2019, 7:26 AM

## 2019-08-26 ENCOUNTER — Encounter (HOSPITAL_COMMUNITY): Payer: Self-pay | Admitting: Certified Registered"

## 2019-08-26 ENCOUNTER — Ambulatory Visit (HOSPITAL_COMMUNITY): Payer: PPO | Admitting: Anesthesiology

## 2019-08-26 ENCOUNTER — Encounter (HOSPITAL_COMMUNITY)
Admission: RE | Disposition: A | Discharge status: Home / Self Care | Payer: Self-pay | Source: Home / Self Care | Attending: Anesthesiology

## 2019-08-26 ENCOUNTER — Ambulatory Visit (HOSPITAL_COMMUNITY): Payer: PPO

## 2019-08-26 ENCOUNTER — Other Ambulatory Visit: Payer: Self-pay

## 2019-08-26 ENCOUNTER — Ambulatory Visit (HOSPITAL_COMMUNITY)
Admission: RE | Admit: 2019-08-26 | Discharge: 2019-08-26 | Disposition: A | Discharge status: Home / Self Care | Payer: PPO | Attending: Anesthesiology | Admitting: Anesthesiology

## 2019-08-26 DIAGNOSIS — J438 Other emphysema: Secondary | ICD-10-CM | POA: Diagnosis not present

## 2019-08-26 DIAGNOSIS — Z7951 Long term (current) use of inhaled steroids: Secondary | ICD-10-CM | POA: Diagnosis not present

## 2019-08-26 DIAGNOSIS — E0590 Thyrotoxicosis, unspecified without thyrotoxic crisis or storm: Secondary | ICD-10-CM | POA: Diagnosis not present

## 2019-08-26 DIAGNOSIS — Z79899 Other long term (current) drug therapy: Secondary | ICD-10-CM | POA: Insufficient documentation

## 2019-08-26 DIAGNOSIS — Z9682 Presence of neurostimulator: Secondary | ICD-10-CM | POA: Diagnosis not present

## 2019-08-26 DIAGNOSIS — Z79890 Hormone replacement therapy (postmenopausal): Secondary | ICD-10-CM | POA: Insufficient documentation

## 2019-08-26 DIAGNOSIS — Z87891 Personal history of nicotine dependence: Secondary | ICD-10-CM | POA: Insufficient documentation

## 2019-08-26 DIAGNOSIS — Z791 Long term (current) use of non-steroidal anti-inflammatories (NSAID): Secondary | ICD-10-CM | POA: Diagnosis not present

## 2019-08-26 DIAGNOSIS — E785 Hyperlipidemia, unspecified: Secondary | ICD-10-CM | POA: Diagnosis not present

## 2019-08-26 DIAGNOSIS — M1990 Unspecified osteoarthritis, unspecified site: Secondary | ICD-10-CM | POA: Insufficient documentation

## 2019-08-26 DIAGNOSIS — Z888 Allergy status to other drugs, medicaments and biological substances status: Secondary | ICD-10-CM | POA: Insufficient documentation

## 2019-08-26 DIAGNOSIS — I501 Left ventricular failure: Secondary | ICD-10-CM | POA: Insufficient documentation

## 2019-08-26 DIAGNOSIS — G894 Chronic pain syndrome: Secondary | ICD-10-CM | POA: Diagnosis not present

## 2019-08-26 DIAGNOSIS — Z882 Allergy status to sulfonamides status: Secondary | ICD-10-CM | POA: Insufficient documentation

## 2019-08-26 DIAGNOSIS — Z881 Allergy status to other antibiotic agents status: Secondary | ICD-10-CM | POA: Diagnosis not present

## 2019-08-26 DIAGNOSIS — G2581 Restless legs syndrome: Secondary | ICD-10-CM | POA: Diagnosis not present

## 2019-08-26 DIAGNOSIS — I959 Hypotension, unspecified: Secondary | ICD-10-CM | POA: Insufficient documentation

## 2019-08-26 DIAGNOSIS — K219 Gastro-esophageal reflux disease without esophagitis: Secondary | ICD-10-CM | POA: Diagnosis not present

## 2019-08-26 DIAGNOSIS — Z880 Allergy status to penicillin: Secondary | ICD-10-CM | POA: Diagnosis not present

## 2019-08-26 DIAGNOSIS — M961 Postlaminectomy syndrome, not elsewhere classified: Secondary | ICD-10-CM

## 2019-08-26 DIAGNOSIS — E039 Hypothyroidism, unspecified: Secondary | ICD-10-CM | POA: Diagnosis not present

## 2019-08-26 DIAGNOSIS — J449 Chronic obstructive pulmonary disease, unspecified: Secondary | ICD-10-CM | POA: Insufficient documentation

## 2019-08-26 HISTORY — PX: SPINAL CORD STIMULATOR INSERTION: SHX5378

## 2019-08-26 HISTORY — DX: Presence of dental prosthetic device (complete) (partial): Z97.2

## 2019-08-26 HISTORY — DX: Postlaminectomy syndrome, not elsewhere classified: M96.1

## 2019-08-26 LAB — CBC
HCT: 37.9 % (ref 36.0–46.0)
Hemoglobin: 12.7 g/dL (ref 12.0–15.0)
MCH: 31.3 pg (ref 26.0–34.0)
MCHC: 33.5 g/dL (ref 30.0–36.0)
MCV: 93.3 fL (ref 80.0–100.0)
Platelets: 291 10*3/uL (ref 150–400)
RBC: 4.06 MIL/uL (ref 3.87–5.11)
RDW: 13.4 % (ref 11.5–15.5)
WBC: 5.3 10*3/uL (ref 4.0–10.5)
nRBC: 0 % (ref 0.0–0.2)

## 2019-08-26 LAB — BASIC METABOLIC PANEL
Anion gap: 9 (ref 5–15)
BUN: 11 mg/dL (ref 8–23)
CO2: 22 mmol/L (ref 22–32)
Calcium: 9 mg/dL (ref 8.9–10.3)
Chloride: 103 mmol/L (ref 98–111)
Creatinine, Ser: 0.53 mg/dL (ref 0.44–1.00)
GFR calc Af Amer: >60 mL/min (ref >60)
GFR calc non Af Amer: >60 mL/min (ref >60)
Glucose, Bld: 105 mg/dL — ABNORMAL HIGH (ref 70–99)
Potassium: 3.7 mmol/L (ref 3.5–5.1)
Sodium: 134 mmol/L — ABNORMAL LOW (ref 135–145)

## 2019-08-26 SURGERY — INSERTION, SPINAL CORD STIMULATOR, LUMBAR
Anesthesia: General

## 2019-08-26 MED ORDER — GLYCOPYRROLATE PF 0.2 MG/ML IJ SOSY
PREFILLED_SYRINGE | INTRAMUSCULAR | Status: DC | PRN
  Administered 2019-08-26: .1 mg via INTRAVENOUS

## 2019-08-26 MED ORDER — VANCOMYCIN HCL IN DEXTROSE 1-5 GM/200ML-% IV SOLN
1000.0000 mg | INTRAVENOUS | Status: AC
  Administered 2019-08-26: 1000 mg via INTRAVENOUS
  Filled 2019-08-26: qty 200

## 2019-08-26 MED ORDER — CHLORHEXIDINE GLUCONATE CLOTH 2 % EX PADS: 6.0000 | MEDICATED_PAD | Freq: Once | CUTANEOUS | Status: DC

## 2019-08-26 MED ORDER — LIDOCAINE 2% (20 MG/ML) 5 ML SYRINGE
INTRAMUSCULAR | Status: AC
  Filled 2019-08-26: qty 5

## 2019-08-26 MED ORDER — LACTATED RINGERS IV SOLN
INTRAVENOUS | Status: DC | PRN
  Administered 2019-08-26 (×2): via INTRAVENOUS

## 2019-08-26 MED ORDER — CLINDAMYCIN HCL 150 MG PO CAPS: 150.0000 mg | ORAL_CAPSULE | Freq: Three times a day (TID) | ORAL | 0 refills | Status: AC

## 2019-08-26 MED ORDER — ROCURONIUM BROMIDE 10 MG/ML (PF) SYRINGE
PREFILLED_SYRINGE | INTRAVENOUS | Status: AC
  Filled 2019-08-26: qty 10

## 2019-08-26 MED ORDER — FENTANYL CITRATE (PF) 100 MCG/2ML IJ SOLN
INTRAMUSCULAR | Status: DC | PRN
  Administered 2019-08-26: 100 ug via INTRAVENOUS
  Administered 2019-08-26: 50 ug via INTRAVENOUS

## 2019-08-26 MED ORDER — BACITRACIN ZINC 500 UNIT/GM EX OINT
TOPICAL_OINTMENT | CUTANEOUS | Status: AC
  Filled 2019-08-26: qty 28.35

## 2019-08-26 MED ORDER — PROPOFOL 10 MG/ML IV BOLUS
INTRAVENOUS | Status: AC
  Filled 2019-08-26: qty 20

## 2019-08-26 MED ORDER — 0.9 % SODIUM CHLORIDE (POUR BTL) OPTIME
TOPICAL | Status: DC | PRN
  Administered 2019-08-26: 1000 mL

## 2019-08-26 MED ORDER — PROPOFOL 10 MG/ML IV BOLUS
INTRAVENOUS | Status: DC | PRN
  Administered 2019-08-26: 120 mg via INTRAVENOUS

## 2019-08-26 MED ORDER — ONDANSETRON HCL 4 MG/2ML IJ SOLN: 4.0000 mg | Freq: Once | INTRAMUSCULAR | Status: DC | PRN

## 2019-08-26 MED ORDER — PHENYLEPHRINE 40 MCG/ML (10ML) SYRINGE FOR IV PUSH (FOR BLOOD PRESSURE SUPPORT)
PREFILLED_SYRINGE | INTRAVENOUS | Status: DC | PRN
  Administered 2019-08-26 (×2): 80 ug via INTRAVENOUS
  Administered 2019-08-26 (×2): 40 ug via INTRAVENOUS

## 2019-08-26 MED ORDER — FENTANYL CITRATE (PF) 250 MCG/5ML IJ SOLN
INTRAMUSCULAR | Status: AC
  Filled 2019-08-26: qty 5

## 2019-08-26 MED ORDER — MIDAZOLAM HCL 5 MG/5ML IJ SOLN
INTRAMUSCULAR | Status: DC | PRN
  Administered 2019-08-26: 2 mg via INTRAVENOUS

## 2019-08-26 MED ORDER — HYDROCODONE-ACETAMINOPHEN 5-325 MG PO TABS: 1.0000 | ORAL_TABLET | ORAL | 0 refills | Status: DC | PRN

## 2019-08-26 MED ORDER — DEXAMETHASONE SODIUM PHOSPHATE 10 MG/ML IJ SOLN
INTRAMUSCULAR | Status: DC | PRN
  Administered 2019-08-26: 8 mg via INTRAVENOUS

## 2019-08-26 MED ORDER — ONDANSETRON HCL 4 MG/2ML IJ SOLN
INTRAMUSCULAR | Status: AC
  Filled 2019-08-26: qty 2

## 2019-08-26 MED ORDER — GLYCOPYRROLATE PF 0.2 MG/ML IJ SOSY
PREFILLED_SYRINGE | INTRAMUSCULAR | Status: AC
  Filled 2019-08-26: qty 1

## 2019-08-26 MED ORDER — PHENYLEPHRINE HCL-NACL 10-0.9 MG/250ML-% IV SOLN
INTRAVENOUS | Status: DC | PRN
  Administered 2019-08-26: 30 ug/min via INTRAVENOUS

## 2019-08-26 MED ORDER — PHENYLEPHRINE 40 MCG/ML (10ML) SYRINGE FOR IV PUSH (FOR BLOOD PRESSURE SUPPORT)
PREFILLED_SYRINGE | INTRAVENOUS | Status: AC
  Filled 2019-08-26: qty 10

## 2019-08-26 MED ORDER — BUPIVACAINE-EPINEPHRINE 0.5% -1:200000 IJ SOLN
INTRAMUSCULAR | Status: AC
  Filled 2019-08-26: qty 1

## 2019-08-26 MED ORDER — SUGAMMADEX SODIUM 200 MG/2ML IV SOLN
INTRAVENOUS | Status: DC | PRN
  Administered 2019-08-26: 200 mg via INTRAVENOUS

## 2019-08-26 MED ORDER — ROCURONIUM BROMIDE 50 MG/5ML IV SOSY
PREFILLED_SYRINGE | INTRAVENOUS | Status: DC | PRN
  Administered 2019-08-26: 40 mg via INTRAVENOUS

## 2019-08-26 MED ORDER — FENTANYL CITRATE (PF) 100 MCG/2ML IJ SOLN: 25.0000 ug | INTRAMUSCULAR | Status: DC | PRN

## 2019-08-26 MED ORDER — DEXAMETHASONE SODIUM PHOSPHATE 10 MG/ML IJ SOLN
INTRAMUSCULAR | Status: AC
  Filled 2019-08-26: qty 1

## 2019-08-26 MED ORDER — OXYCODONE HCL 5 MG PO TABS: 5.0000 mg | ORAL_TABLET | Freq: Once | ORAL | Status: DC | PRN

## 2019-08-26 MED ORDER — LIDOCAINE 2% (20 MG/ML) 5 ML SYRINGE
INTRAMUSCULAR | Status: DC | PRN
  Administered 2019-08-26: 60 mg via INTRAVENOUS

## 2019-08-26 MED ORDER — LACTATED RINGERS IV SOLN: INTRAVENOUS | Status: DC

## 2019-08-26 MED ORDER — MIDAZOLAM HCL 2 MG/2ML IJ SOLN
INTRAMUSCULAR | Status: AC
  Filled 2019-08-26: qty 2

## 2019-08-26 MED ORDER — ONDANSETRON HCL 4 MG/2ML IJ SOLN
INTRAMUSCULAR | Status: DC | PRN
  Administered 2019-08-26: 4 mg via INTRAVENOUS

## 2019-08-26 MED ORDER — BUPIVACAINE-EPINEPHRINE (PF) 0.5% -1:200000 IJ SOLN
INTRAMUSCULAR | Status: DC | PRN
  Administered 2019-08-26: 30 mL

## 2019-08-26 MED ORDER — SODIUM CHLORIDE 0.9 % IV SOLN
INTRAVENOUS | Status: DC | PRN
  Administered 2019-08-26: 500 mL

## 2019-08-26 MED ORDER — OXYCODONE HCL 5 MG/5ML PO SOLN: 5.0000 mg | Freq: Once | ORAL | Status: DC | PRN

## 2019-08-26 SURGICAL SUPPLY — 64 items
ADH SKN CLS APL DERMABOND .7 (GAUZE/BANDAGES/DRESSINGS) ×1
APL PRP STRL LF DISP 70% ISPRP (MISCELLANEOUS) ×1
APL SKNCLS STERI-STRIP NONHPOA (GAUZE/BANDAGES/DRESSINGS)
BAG DECANTER FOR FLEXI CONT (MISCELLANEOUS) ×2 IMPLANT
BENZOIN TINCTURE PRP APPL 2/3 (GAUZE/BANDAGES/DRESSINGS) IMPLANT
BINDER ABDOMINAL 12 ML 46-62 (SOFTGOODS) ×2 IMPLANT
BLADE CLIPPER SURG (BLADE) IMPLANT
CHLORAPREP W/TINT 26 (MISCELLANEOUS) ×2 IMPLANT
CLIP VESOCCLUDE SM WIDE 6/CT (CLIP) ×1 IMPLANT
COVER WAND RF STERILE (DRAPES) ×2 IMPLANT
DERMABOND ADVANCED (GAUZE/BANDAGES/DRESSINGS) ×1
DERMABOND ADVANCED .7 DNX12 (GAUZE/BANDAGES/DRESSINGS) ×1 IMPLANT
DRAPE C-ARM 42X72 X-RAY (DRAPES) ×2 IMPLANT
DRAPE C-ARMOR (DRAPES) ×2 IMPLANT
DRAPE LAPAROTOMY 100X72X124 (DRAPES) ×2 IMPLANT
DRAPE SURG 17X23 STRL (DRAPES) ×2 IMPLANT
DRSG OPSITE POSTOP 3X4 (GAUZE/BANDAGES/DRESSINGS) ×2 IMPLANT
ELECT REM PT RETURN 9FT ADLT (ELECTROSURGICAL) ×2
ELECTRODE REM PT RTRN 9FT ADLT (ELECTROSURGICAL) ×1 IMPLANT
GAUZE 4X4 16PLY RFD (DISPOSABLE) ×2 IMPLANT
GENERATOR NEUROSTIMULATOR (Neurostimulator) ×1 IMPLANT
GLOVE BIOGEL PI IND STRL 7.5 (GLOVE) ×1 IMPLANT
GLOVE BIOGEL PI INDICATOR 7.5 (GLOVE) ×1
GLOVE ECLIPSE 7.5 STRL STRAW (GLOVE) ×2 IMPLANT
GLOVE EXAM NITRILE LRG STRL (GLOVE) IMPLANT
GLOVE EXAM NITRILE XL STR (GLOVE) IMPLANT
GLOVE EXAM NITRILE XS STR PU (GLOVE) IMPLANT
GOWN STRL REUS W/ TWL LRG LVL3 (GOWN DISPOSABLE) IMPLANT
GOWN STRL REUS W/ TWL XL LVL3 (GOWN DISPOSABLE) IMPLANT
GOWN STRL REUS W/TWL 2XL LVL3 (GOWN DISPOSABLE) IMPLANT
GOWN STRL REUS W/TWL LRG LVL3 (GOWN DISPOSABLE)
GOWN STRL REUS W/TWL XL LVL3 (GOWN DISPOSABLE)
KIT BASIN OR (CUSTOM PROCEDURE TRAY) ×2 IMPLANT
KIT CHARGING (KITS) ×1
KIT CHARGING PRECISION NEURO (KITS) IMPLANT
KIT LEAD PERCUTANOUS INFINION (Lead) ×2 IMPLANT
KIT REMOTE CONTROL 112802 FREE (KITS) ×1 IMPLANT
KIT TURNOVER KIT B (KITS) ×2 IMPLANT
NDL 18GX1X1/2 (RX/OR ONLY) (NEEDLE) IMPLANT
NDL ENTRADA 4.5IN (NEEDLE) IMPLANT
NDL HYPO 25X1 1.5 SAFETY (NEEDLE) ×1 IMPLANT
NEEDLE 18GX1X1/2 (RX/OR ONLY) (NEEDLE) IMPLANT
NEEDLE ENTRADA 4.5IN (NEEDLE) ×4 IMPLANT
NEEDLE HYPO 25X1 1.5 SAFETY (NEEDLE) ×2 IMPLANT
NS IRRIG 1000ML POUR BTL (IV SOLUTION) ×2 IMPLANT
PACK LAMINECTOMY NEURO (CUSTOM PROCEDURE TRAY) ×2 IMPLANT
PAD ARMBOARD 7.5X6 YLW CONV (MISCELLANEOUS) ×2 IMPLANT
SPONGE LAP 4X18 RFD (DISPOSABLE) ×2 IMPLANT
SPONGE SURGIFOAM ABS GEL SZ50 (HEMOSTASIS) IMPLANT
STAPLER SKIN PROX WIDE 3.9 (STAPLE) IMPLANT
STRIP CLOSURE SKIN 1/2X4 (GAUZE/BANDAGES/DRESSINGS) IMPLANT
SUT MNCRL AB 4-0 PS2 18 (SUTURE) IMPLANT
SUT SILK 0 (SUTURE) ×2
SUT SILK 0 MO-6 18XCR BRD 8 (SUTURE) ×1 IMPLANT
SUT SILK 0 TIES 10X30 (SUTURE) IMPLANT
SUT SILK 2 0 TIES 10X30 (SUTURE) IMPLANT
SUT VIC AB 2-0 CP2 18 (SUTURE) ×4 IMPLANT
SYR 10ML LL (SYRINGE) IMPLANT
SYR EPIDURAL 5ML GLASS (SYRINGE) ×2 IMPLANT
TOOL LONG TUNNEL (SPINAL CORD STIMULATOR) ×1 IMPLANT
TOWEL GREEN STERILE (TOWEL DISPOSABLE) ×2 IMPLANT
TOWEL GREEN STERILE FF (TOWEL DISPOSABLE) ×2 IMPLANT
WATER STERILE IRR 1000ML POUR (IV SOLUTION) ×2 IMPLANT
YANKAUER SUCT BULB TIP NO VENT (SUCTIONS) ×2 IMPLANT

## 2019-08-26 NOTE — Anesthesia Procedure Notes (Signed)
Procedure Name: Intubation Date/Time: 08/26/2019 7:39 AM Performed by: Orlie Dakin, CRNA Pre-anesthesia Checklist: Patient identified, Emergency Drugs available, Suction available and Patient being monitored Patient Re-evaluated:Patient Re-evaluated prior to induction Oxygen Delivery Method: Circle system utilized Preoxygenation: Pre-oxygenation with 100% oxygen Induction Type: IV induction Ventilation: Mask ventilation without difficulty and Oral airway inserted - appropriate to patient size Laryngoscope Size: Sabra Heck and 3 Grade View: Grade I Tube type: Oral Tube size: 7.0 mm Number of attempts: 1 Airway Equipment and Method: Stylet Placement Confirmation: ETT inserted through vocal cords under direct vision,  positive ETCO2 and breath sounds checked- equal and bilateral Secured at: 22 cm Tube secured with: Tape Dental Injury: Teeth and Oropharynx as per pre-operative assessment

## 2019-08-26 NOTE — Discharge Instructions (Addendum)

## 2019-08-26 NOTE — Transfer of Care (Signed)
Immediate Anesthesia Transfer of Care Note  Patient: [name]  Procedure(s) Performed: LUMBAR SPINAL CORD STIMULATOR INSERTION (N/A )  Patient Location: PACU  Anesthesia Type:General  Level of Consciousness: awake and patient cooperative  Airway & Oxygen Therapy: Patient Spontanous Breathing and Patient connected to nasal cannula oxygen  Post-op Assessment: Report given to RN and Post -op Vital signs reviewed and stable  Post vital signs: Reviewed and stable  Last Vitals:  Vitals Value Taken Time  BP 109/51 08/26/19 0859  Temp    Pulse 84 08/26/19 0859  Resp 11 08/26/19 0859  SpO2 100 % 08/26/19 0859  Vitals shown include unvalidated device data.  Last Pain:  Vitals:   08/26/19 0644  TempSrc:   PainSc: 0-No pain      Patients Stated Pain Goal: 2 ([id number] Q000111Q)  Complications: No apparent anesthesia complications

## 2019-08-26 NOTE — Op Note (Signed)
PREOP DX: 1) lumbar post-laminectomy syndrome; 2) chronic pain syndrome  POSTOP [id number] as preop PROCEDURES PERFORMED:1) intraop fluoro 2) placement of 2 16 contact [employer] Infinion leads 3) placement of Spectra WavewriterSCS generator 4) post op complex SCS programming SURGEON:Knight Oelkers  ASSISTANT: NONE  ANESTHESIA:GETA EBL: <20cc  DESCRIPTION OF PROCEDURE: After a discussion of risks, benefits and alternatives, informed consent was obtained. The patient was taken to the OR,general anesthesia induced,turned prone onto a Jackson table, all pressure points padded, SCD's placed. A timeout was taken to verify the correct patient, position, personnel, availability of appropriate equipment, and administration of perioperative antibiotics.  The thoracic and lumbar areas were widely prepped with chloraprep and draped into a sterile field. Fluoroscopy was used to plan aLEFTparamedian incision at theL1-L3 levels, and an incision made with a 10 blade and carried down to the dorsolumbar fascia with the bovie and blunt dissection. Retractors were placed and a 14g [employer] tuohy needle placed into the epidural space at theT12-L1interspace using biplanar fluoro and loss-of-resistance technique. The needle was aspirated without any return of fluid. A [employer] INFINION lead was introduced and under live AP fluoro advanced until the distal-most4 contactsoverlay thesuperioraspectof theT7vertebral body shadow with the rest of the contacts distributed over [employer] in a position just right of anatomic midline. A second Infinion lead was placed just left of anatomic midlineusing the same technique. 0 silk sutures were placed in the fascia adjacent to the needles. The needles and stylets were removed under fluoroscopy with no lead migration noted. Leads were then fixed to the fascia with the sutures tied in a chest tube type fixation; repeat images were  obtained to verify that there had been no lead migration. Hemaclips were placed as radiomarkers for lead placement. The incision was inspected and hemostasis obtained with the bipolar cautery.    Attention was then turned to creation of a subcutaneous pocket. At theleftflank/buttock, a 3 cm incision was made with a 10 blade and using the bovie and blunt dissection a pocket of size appropriate to place a SCS generator. The pocket was trialed, and found to be of adequate size. The pocket was inspected for hemostasis, which was found to be excellent. Using reverse seldinger technique, the leads were tunneled to the pocket site, and the leads inserted into the SCS generator. Impedances were checked, and all found to be excellent. The leads were then all fixed into position with a self-torquing wrench. The wiring was all carefully coiled, placed behind the generator and placed in the pocket.  Both incisions were copiously irrigated with bacitracin-containing irrigation. The lumbar incision was closed in3deep layers of interrupted 2-0 vicryl and the skin closed a running 3-0 monocryl subcuticular suture and dermabond.The pocket incision was closed with a deeper layer of 2-0 vicryl interrupted sutures, and the skin closed a running 3-0 monocryl subcuticular suture and dermabond.Sterile dressings were applied.   Needle, sponge, and instrument counts were correct x2 at the end of the case.    The patient was then carefully awakened from anesthesia, turned supine, an abdominal binder placed, and the patient taken to the recovery room where she underwent complex spinal cord stimulator programming.  COMPLICATIONS: NONE  CONDITION: Stable throughout the course of the procedure and immediately afterward  DISPOSITION:anticipatedischarge to home, with antibiotics and pain medicine. Discussed care with the patientandfamily. Followup in clinic will be scheduled in 10-14 days.

## 2019-08-28 NOTE — Anesthesia Postprocedure Evaluation (Signed)
Anesthesia Post Note  Patient: [name]  Procedure(s) Performed: LUMBAR SPINAL CORD STIMULATOR INSERTION (N/A )     Patient location during evaluation: PACU Anesthesia Type: General Level of consciousness: awake and alert Pain management: pain level controlled Vital Signs Assessment: post-procedure vital signs reviewed and stable Respiratory status: spontaneous breathing, nonlabored ventilation and respiratory function stable Cardiovascular status: blood pressure returned to baseline and stable Postop Assessment: no apparent nausea or vomiting Anesthetic complications: no    Last Vitals:  Vitals:   08/26/19 1030 08/26/19 1045  BP:  112/66  Pulse: 82 81  Resp: 19 18  Temp: 36.6 C   SpO2: 99% 99%    Last Pain:  Vitals:   08/26/19 1030  TempSrc:   PainSc: 0-No pain                 Audry Pili

## 2019-08-29 ENCOUNTER — Encounter (HOSPITAL_COMMUNITY): Payer: Self-pay | Admitting: Anesthesiology

## 2019-08-29 ENCOUNTER — Other Ambulatory Visit: Payer: Self-pay | Admitting: Cardiovascular Disease

## 2019-08-30 NOTE — Telephone Encounter (Signed)
Rx has been sent to the pharmacy electronically. ° °

## 2019-09-02 ENCOUNTER — Ambulatory Visit: Payer: PPO | Admitting: Cardiovascular Disease

## 2019-09-12 DIAGNOSIS — L659 Nonscarring hair loss, unspecified: Secondary | ICD-10-CM | POA: Diagnosis not present

## 2019-09-12 DIAGNOSIS — R410 Disorientation, unspecified: Secondary | ICD-10-CM | POA: Diagnosis not present

## 2019-09-12 DIAGNOSIS — I519 Heart disease, unspecified: Secondary | ICD-10-CM | POA: Diagnosis not present

## 2019-09-12 DIAGNOSIS — I7300 Raynaud's syndrome without gangrene: Secondary | ICD-10-CM | POA: Diagnosis not present

## 2019-09-12 DIAGNOSIS — M0609 Rheumatoid arthritis without rheumatoid factor, multiple sites: Secondary | ICD-10-CM | POA: Diagnosis not present

## 2019-09-12 DIAGNOSIS — M25512 Pain in left shoulder: Secondary | ICD-10-CM | POA: Diagnosis not present

## 2019-09-12 DIAGNOSIS — M150 Primary generalized (osteo)arthritis: Secondary | ICD-10-CM | POA: Diagnosis not present

## 2019-09-12 DIAGNOSIS — Z6824 Body mass index (BMI) 24.0-24.9, adult: Secondary | ICD-10-CM | POA: Diagnosis not present

## 2019-09-12 DIAGNOSIS — E663 Overweight: Secondary | ICD-10-CM | POA: Diagnosis not present

## 2019-09-13 DIAGNOSIS — M6281 Muscle weakness (generalized): Secondary | ICD-10-CM | POA: Diagnosis not present

## 2019-09-13 DIAGNOSIS — M25612 Stiffness of left shoulder, not elsewhere classified: Secondary | ICD-10-CM | POA: Diagnosis not present

## 2019-09-13 DIAGNOSIS — M25512 Pain in left shoulder: Secondary | ICD-10-CM | POA: Diagnosis not present

## 2019-09-16 DIAGNOSIS — M25612 Stiffness of left shoulder, not elsewhere classified: Secondary | ICD-10-CM | POA: Diagnosis not present

## 2019-09-16 DIAGNOSIS — M6281 Muscle weakness (generalized): Secondary | ICD-10-CM | POA: Diagnosis not present

## 2019-09-16 DIAGNOSIS — M25512 Pain in left shoulder: Secondary | ICD-10-CM | POA: Diagnosis not present

## 2019-09-19 ENCOUNTER — Other Ambulatory Visit: Payer: Self-pay | Admitting: Cardiovascular Disease

## 2019-09-19 ENCOUNTER — Other Ambulatory Visit: Payer: Self-pay

## 2019-09-19 ENCOUNTER — Ambulatory Visit: Payer: PPO | Admitting: Cardiovascular Disease

## 2019-09-19 ENCOUNTER — Encounter: Payer: Self-pay | Admitting: Cardiovascular Disease

## 2019-09-19 VITALS — BP 124/68 | HR 111 | Ht 61.0 in | Wt 126.0 lb

## 2019-09-19 DIAGNOSIS — R002 Palpitations: Secondary | ICD-10-CM

## 2019-09-19 DIAGNOSIS — R000 Tachycardia, unspecified: Secondary | ICD-10-CM

## 2019-09-19 DIAGNOSIS — E782 Mixed hyperlipidemia: Secondary | ICD-10-CM | POA: Diagnosis not present

## 2019-09-19 NOTE — Progress Notes (Signed)
09/19/2019 [name]   02/26/1946  [id number]  Primary Physician Dough, Jaymes Graff, MD Primary Cardiologist: Lorretta Harp MD Garret Reddish, [place], [place]  HPI:  [name] is a 73 y.o.  Caucasian female who I last saw in the office 10/01/2016. She was referred because of palpitations. A Holter showed PVCs. She did admit to caffeine intake. 2-D echo prior to that showed an EF of 40-45% although most recently had increased 50-55%. Her symptoms improved with low-dose beta-blockade and decreasing caffeine intake.   Since I saw her in the office 3 years ago she continues to do well.  She denies palpitations, chest pain or shortness of breath.  Current Meds  Medication Sig  . acetaminophen (TYLENOL) 500 MG tablet Take 500 mg by mouth 2 (two) times daily as needed for moderate pain or headache.  . Alpha-Lipoic Acid 100 MG CAPS Take 100 mg by mouth daily.   Marland Kitchen azelastine (ASTELIN) 137 MCG/SPRAY nasal spray Place 1 spray into both nostrils 2 (two) times daily.   . carvedilol (COREG) 6.25 MG tablet Take 1 tablet (6.25 mg total) by mouth 2 (two) times daily.  . cholecalciferol (VITAMIN D3) 25 MCG (1000 UT) tablet Take 1,000 Units by mouth daily.  . fluticasone (FLONASE) 50 MCG/ACT nasal spray Place 1 spray into both nostrils 2 (two) times daily.   . fluticasone furoate-vilanterol (BREO ELLIPTA) 100-25 MCG/INH AEPB Inhale 1 puff into the lungs daily.  . folic acid (FOLVITE) Q000111Q MCG tablet Take 800 mcg by mouth daily.  . hydroxychloroquine (PLAQUENIL) 200 MG tablet Take 200 mg by mouth daily.  Javier Docker Oil 500 MG CAPS Take 500 mg by mouth daily.  Marland Kitchen levothyroxine (SYNTHROID, LEVOTHROID) 50 MCG tablet Take 50 mcg by mouth every evening.   Marland Kitchen LYSINE PO Take 1,000 mg by mouth daily.  . Melatonin 5 MG TABS Take 5 mg by mouth at bedtime.   . naproxen (NAPROSYN) 500 MG tablet Take 500 mg by mouth 2 (two) times daily with a meal.  . omeprazole (PRILOSEC) 40 MG capsule Take 40 mg by mouth every evening.   Marland Kitchen rOPINIRole (REQUIP) 2 MG tablet Take 2 mg by mouth 3 (three) times daily.   . temazepam (RESTORIL) 30 MG capsule Take 30 mg by mouth at bedtime.     Allergies  Allergen Reactions  . Adhesive [Tape] Itching  . Amoxicillin Diarrhea and Other (See Comments)    Did it involve swelling of the face/tongue/throat, SOB, or low BP? No Did it involve sudden or severe rash/hives, skin peeling, or any reaction on the inside of your mouth or nose? No Did you need to seek medical attention at a hospital or doctor's office? No When did it last happen?past year or two If all above answers are "NO", may proceed with cephalosporin use.   . Ciprofloxacin Nausea And Vomiting  . Statins Other (See Comments)    CRAMPS  . Sulfa Antibiotics Diarrhea  . Sulfamethoxazole Swelling and Other (See Comments)    Stomatitis, Angioedema    Social History   Socioeconomic History  . Marital status: Married    Spouse name: [name]  . Number of children: 2  . Years of education: 61  . Highest education level: Not on file  Occupational History  . Occupation: Public affairs consultant: [employer]    Comment: Part time   Social Needs  . Financial resource strain: Not on file  . Food insecurity  Worry: Not on file    Inability: Not on file  . Transportation needs    Medical: Not on file    Non-medical: Not on file  Tobacco Use  . Smoking status: Former Smoker    Types: Cigarettes    Quit date: 08/25/1993    Years since quitting: 26.0  . Smokeless tobacco: Never Used  . Tobacco comment: Quit about 20 years before.  Substance and Sexual Activity  . Alcohol use: Not Currently  . Drug use: No  . Sexual activity: Never  Lifestyle  . Physical activity    Days per week: Not on file    Minutes per session: Not on file  . Stress: Not on file  Relationships  . Social Herbalist on phone: Not on file    Gets together: Not on file    Attends religious service: Not on file     Active member of club or organization: Not on file    Attends meetings of clubs or organizations: Not on file    Relationship status: Not on file  . Intimate partner violence    Fear of current or ex partner: Not on file    Emotionally abused: Not on file    Physically abused: Not on file    Forced sexual activity: Not on file  Other Topics Concern  . Not on file  Social History Narrative   Patient lives with her husband. ([name]) - [place]   She is an adopted kid and so is unaware of her family history.   She has 2 children- one has asthma and other has arthritis.                 Review of Systems: General: negative for chills, fever, night sweats or weight changes.  Cardiovascular: negative for chest pain, dyspnea on exertion, edema, orthopnea, palpitations, paroxysmal nocturnal dyspnea or shortness of breath Dermatological: negative for rash Respiratory: negative for cough or wheezing Urologic: negative for hematuria Abdominal: negative for nausea, vomiting, diarrhea, bright red blood per rectum, melena, or hematemesis Neurologic: negative for visual changes, syncope, or dizziness All other systems reviewed and are otherwise negative except as noted above.    Blood pressure 124/68, pulse (!) 111, height 5\' 1"  (1.549 m), weight 126 lb (57.2 kg), SpO2 94 %.  General appearance: alert and no distress Neck: no adenopathy, no carotid bruit, no JVD, supple, symmetrical, trachea midline and thyroid not enlarged, symmetric, no tenderness/mass/nodules Lungs: clear to auscultation bilaterally Heart: regular rate and rhythm, S1, S2 normal, no murmur, click, rub or gallop Extremities: extremities normal, atraumatic, no cyanosis or edema Pulses: 2+ and symmetric Skin: Skin color, texture, turgor normal. No rashes or lesions Neurologic: Alert and oriented X 3, normal strength and tone. Normal symmetric reflexes. Normal coordination and gait  EKG sinus tachycardia at 111 without ST or T  wave changes.  Personally reviewed this EKG.  ASSESSMENT AND PLAN:   Hyperlipidemia History of hyperlipidemia not on statin therapy followed by her PCP  Palpitations History of palpitations when I saw her remotely most likely related to PVCs seen on Holter monitoring from caffeine intake.  This was reduced and her palpitations resolved.      Lorretta Harp MD FACP,FACC,FAHA, [facility] 09/19/2019 11:35 AM

## 2019-09-19 NOTE — Assessment & Plan Note (Signed)
History of hyperlipidemia not on statin therapy followed by her PCP 

## 2019-09-19 NOTE — Assessment & Plan Note (Signed)
History of palpitations when I saw her remotely most likely related to PVCs seen on Holter monitoring from caffeine intake.  This was reduced and her palpitations resolved.

## 2019-09-19 NOTE — Patient Instructions (Addendum)
Medication Instructions:  Your physician recommends that you continue on your current medications as directed. Please refer to the Current Medication list given to you today.  If you need a refill on your cardiac medications before your next appointment, please call your pharmacy.   Lab work: NONE  Testing/Procedures: NONE  Follow-Up: At [facility], you and your health needs are our priority.  As part of our continuing mission to provide you with exceptional heart care, we have created designated Provider Care Teams.  These Care Teams include your primary Cardiologist (physician) and Advanced Practice Providers (APPs -  Physician Assistants and Nurse Practitioners) who all work together to provide you with the care you need, when you need it. You may see Jonathan Berry, MD or one of the following Advanced Practice Providers on your designated Care Team:    Luke Kilroy, PA-C  Callie Goodrich, PA-C  Jesse Cleaver, FNP Your physician wants you to follow-up as needed.      

## 2019-09-20 ENCOUNTER — Other Ambulatory Visit: Payer: Self-pay | Admitting: Cardiovascular Disease

## 2019-09-20 DIAGNOSIS — M25512 Pain in left shoulder: Secondary | ICD-10-CM | POA: Diagnosis not present

## 2019-09-20 DIAGNOSIS — M6281 Muscle weakness (generalized): Secondary | ICD-10-CM | POA: Diagnosis not present

## 2019-09-20 DIAGNOSIS — M25612 Stiffness of left shoulder, not elsewhere classified: Secondary | ICD-10-CM | POA: Diagnosis not present

## 2019-09-20 MED ORDER — CARVEDILOL 6.25 MG PO TABS: 6.2500 mg | ORAL_TABLET | Freq: Two times a day (BID) | ORAL | 3 refills | Status: AC

## 2019-09-20 NOTE — Telephone Encounter (Signed)
New Message     *STAT* If patient is at the pharmacy, call can be transferred to refill team.   1. Which medications need to be refilled? (please list name of each medication and dose if known) carvedilol (COREG) 6.25 MG tablet  2. Which pharmacy/location (including street and city if local pharmacy) is medication to be sent to? [place], [place] - [address]  3. Do they need a 30 day or 90 day supply? 72 Day supply   Pt says she called the pharmacy this morning and they only filled the medication for 15

## 2019-09-26 DIAGNOSIS — M75102 Unspecified rotator cuff tear or rupture of left shoulder, not specified as traumatic: Secondary | ICD-10-CM | POA: Diagnosis not present

## 2019-09-26 DIAGNOSIS — M25512 Pain in left shoulder: Secondary | ICD-10-CM | POA: Diagnosis not present

## 2019-09-27 DIAGNOSIS — M25612 Stiffness of left shoulder, not elsewhere classified: Secondary | ICD-10-CM | POA: Diagnosis not present

## 2019-09-27 DIAGNOSIS — M6281 Muscle weakness (generalized): Secondary | ICD-10-CM | POA: Diagnosis not present

## 2019-09-27 DIAGNOSIS — M25512 Pain in left shoulder: Secondary | ICD-10-CM | POA: Diagnosis not present

## 2019-10-03 DIAGNOSIS — G8929 Other chronic pain: Secondary | ICD-10-CM | POA: Diagnosis not present

## 2019-10-03 DIAGNOSIS — G2581 Restless legs syndrome: Secondary | ICD-10-CM | POA: Diagnosis not present

## 2019-10-03 DIAGNOSIS — I2510 Atherosclerotic heart disease of native coronary artery without angina pectoris: Secondary | ICD-10-CM | POA: Diagnosis not present

## 2019-10-03 DIAGNOSIS — E039 Hypothyroidism, unspecified: Secondary | ICD-10-CM | POA: Diagnosis not present

## 2019-10-03 DIAGNOSIS — F5101 Primary insomnia: Secondary | ICD-10-CM | POA: Diagnosis not present

## 2019-10-03 DIAGNOSIS — M25512 Pain in left shoulder: Secondary | ICD-10-CM | POA: Diagnosis not present

## 2019-10-03 DIAGNOSIS — J309 Allergic rhinitis, unspecified: Secondary | ICD-10-CM | POA: Diagnosis not present

## 2019-10-03 DIAGNOSIS — E782 Mixed hyperlipidemia: Secondary | ICD-10-CM | POA: Diagnosis not present

## 2019-10-03 DIAGNOSIS — K219 Gastro-esophageal reflux disease without esophagitis: Secondary | ICD-10-CM | POA: Diagnosis not present

## 2019-10-03 DIAGNOSIS — J439 Emphysema, unspecified: Secondary | ICD-10-CM | POA: Diagnosis not present

## 2019-10-05 DIAGNOSIS — R030 Elevated blood-pressure reading, without diagnosis of hypertension: Secondary | ICD-10-CM | POA: Diagnosis not present

## 2019-10-05 DIAGNOSIS — Z6830 Body mass index (BMI) 30.0-30.9, adult: Secondary | ICD-10-CM | POA: Diagnosis not present

## 2019-10-05 DIAGNOSIS — Z9689 Presence of other specified functional implants: Secondary | ICD-10-CM | POA: Diagnosis not present

## 2019-10-05 DIAGNOSIS — G894 Chronic pain syndrome: Secondary | ICD-10-CM | POA: Diagnosis not present

## 2019-10-25 DIAGNOSIS — Z79899 Other long term (current) drug therapy: Secondary | ICD-10-CM | POA: Diagnosis not present

## 2019-10-25 DIAGNOSIS — G8918 Other acute postprocedural pain: Secondary | ICD-10-CM | POA: Diagnosis not present

## 2019-10-25 DIAGNOSIS — M25512 Pain in left shoulder: Secondary | ICD-10-CM | POA: Diagnosis not present

## 2019-10-25 DIAGNOSIS — M25559 Pain in unspecified hip: Secondary | ICD-10-CM | POA: Diagnosis not present

## 2019-10-25 DIAGNOSIS — S46212A Strain of muscle, fascia and tendon of other parts of biceps, left arm, initial encounter: Secondary | ICD-10-CM | POA: Diagnosis not present

## 2019-10-25 DIAGNOSIS — E039 Hypothyroidism, unspecified: Secondary | ICD-10-CM | POA: Diagnosis not present

## 2019-10-25 DIAGNOSIS — M19012 Primary osteoarthritis, left shoulder: Secondary | ICD-10-CM | POA: Diagnosis not present

## 2019-10-25 DIAGNOSIS — J449 Chronic obstructive pulmonary disease, unspecified: Secondary | ICD-10-CM | POA: Diagnosis not present

## 2019-10-25 DIAGNOSIS — M75102 Unspecified rotator cuff tear or rupture of left shoulder, not specified as traumatic: Secondary | ICD-10-CM | POA: Diagnosis not present

## 2019-10-25 DIAGNOSIS — E782 Mixed hyperlipidemia: Secondary | ICD-10-CM | POA: Diagnosis not present

## 2019-10-25 DIAGNOSIS — F5101 Primary insomnia: Secondary | ICD-10-CM | POA: Diagnosis not present

## 2019-10-25 DIAGNOSIS — K219 Gastro-esophageal reflux disease without esophagitis: Secondary | ICD-10-CM | POA: Diagnosis not present

## 2019-10-25 DIAGNOSIS — I2510 Atherosclerotic heart disease of native coronary artery without angina pectoris: Secondary | ICD-10-CM | POA: Diagnosis not present

## 2019-10-25 DIAGNOSIS — Z862 Personal history of diseases of the blood and blood-forming organs and certain disorders involving the immune mechanism: Secondary | ICD-10-CM | POA: Diagnosis not present

## 2019-10-25 DIAGNOSIS — Z8673 Personal history of transient ischemic attack (TIA), and cerebral infarction without residual deficits: Secondary | ICD-10-CM | POA: Diagnosis not present

## 2019-10-25 DIAGNOSIS — I10 Essential (primary) hypertension: Secondary | ICD-10-CM | POA: Diagnosis not present

## 2019-10-25 DIAGNOSIS — J309 Allergic rhinitis, unspecified: Secondary | ICD-10-CM | POA: Diagnosis not present

## 2019-10-25 DIAGNOSIS — G2581 Restless legs syndrome: Secondary | ICD-10-CM | POA: Diagnosis not present

## 2019-10-25 DIAGNOSIS — M75122 Complete rotator cuff tear or rupture of left shoulder, not specified as traumatic: Secondary | ICD-10-CM | POA: Diagnosis not present

## 2019-10-25 DIAGNOSIS — G4700 Insomnia, unspecified: Secondary | ICD-10-CM | POA: Diagnosis not present

## 2019-10-25 DIAGNOSIS — M069 Rheumatoid arthritis, unspecified: Secondary | ICD-10-CM | POA: Diagnosis not present

## 2019-10-25 DIAGNOSIS — M7552 Bursitis of left shoulder: Secondary | ICD-10-CM | POA: Diagnosis not present

## 2019-10-25 DIAGNOSIS — M7918 Myalgia, other site: Secondary | ICD-10-CM | POA: Diagnosis not present

## 2019-10-25 DIAGNOSIS — Z87891 Personal history of nicotine dependence: Secondary | ICD-10-CM | POA: Diagnosis not present

## 2019-10-25 DIAGNOSIS — M549 Dorsalgia, unspecified: Secondary | ICD-10-CM | POA: Diagnosis not present

## 2019-11-01 DIAGNOSIS — M6281 Muscle weakness (generalized): Secondary | ICD-10-CM | POA: Diagnosis not present

## 2019-11-01 DIAGNOSIS — M25612 Stiffness of left shoulder, not elsewhere classified: Secondary | ICD-10-CM | POA: Diagnosis not present

## 2019-11-01 DIAGNOSIS — M25512 Pain in left shoulder: Secondary | ICD-10-CM | POA: Diagnosis not present

## 2019-11-03 DIAGNOSIS — Z9689 Presence of other specified functional implants: Secondary | ICD-10-CM | POA: Diagnosis not present

## 2019-11-03 DIAGNOSIS — G894 Chronic pain syndrome: Secondary | ICD-10-CM | POA: Diagnosis not present

## 2019-11-03 DIAGNOSIS — M961 Postlaminectomy syndrome, not elsewhere classified: Secondary | ICD-10-CM | POA: Diagnosis not present

## 2019-11-04 DIAGNOSIS — M25612 Stiffness of left shoulder, not elsewhere classified: Secondary | ICD-10-CM | POA: Diagnosis not present

## 2019-11-04 DIAGNOSIS — M25512 Pain in left shoulder: Secondary | ICD-10-CM | POA: Diagnosis not present

## 2019-11-04 DIAGNOSIS — M6281 Muscle weakness (generalized): Secondary | ICD-10-CM | POA: Diagnosis not present

## 2019-11-07 DIAGNOSIS — Z0000 Encounter for general adult medical examination without abnormal findings: Secondary | ICD-10-CM | POA: Diagnosis not present

## 2019-11-07 DIAGNOSIS — I2510 Atherosclerotic heart disease of native coronary artery without angina pectoris: Secondary | ICD-10-CM | POA: Diagnosis not present

## 2019-11-07 DIAGNOSIS — R7301 Impaired fasting glucose: Secondary | ICD-10-CM | POA: Diagnosis not present

## 2019-11-07 DIAGNOSIS — E782 Mixed hyperlipidemia: Secondary | ICD-10-CM | POA: Diagnosis not present

## 2019-11-07 DIAGNOSIS — E039 Hypothyroidism, unspecified: Secondary | ICD-10-CM | POA: Diagnosis not present

## 2019-11-08 DIAGNOSIS — M25612 Stiffness of left shoulder, not elsewhere classified: Secondary | ICD-10-CM | POA: Diagnosis not present

## 2019-11-08 DIAGNOSIS — M6281 Muscle weakness (generalized): Secondary | ICD-10-CM | POA: Diagnosis not present

## 2019-11-08 DIAGNOSIS — M25512 Pain in left shoulder: Secondary | ICD-10-CM | POA: Diagnosis not present

## 2019-11-11 DIAGNOSIS — M25512 Pain in left shoulder: Secondary | ICD-10-CM | POA: Diagnosis not present

## 2019-11-11 DIAGNOSIS — M6281 Muscle weakness (generalized): Secondary | ICD-10-CM | POA: Diagnosis not present

## 2019-11-11 DIAGNOSIS — M25612 Stiffness of left shoulder, not elsewhere classified: Secondary | ICD-10-CM | POA: Diagnosis not present

## 2019-11-15 DIAGNOSIS — M25612 Stiffness of left shoulder, not elsewhere classified: Secondary | ICD-10-CM | POA: Diagnosis not present

## 2019-11-15 DIAGNOSIS — M6281 Muscle weakness (generalized): Secondary | ICD-10-CM | POA: Diagnosis not present

## 2019-11-15 DIAGNOSIS — M25512 Pain in left shoulder: Secondary | ICD-10-CM | POA: Diagnosis not present

## 2019-11-17 DIAGNOSIS — M25612 Stiffness of left shoulder, not elsewhere classified: Secondary | ICD-10-CM | POA: Diagnosis not present

## 2019-11-17 DIAGNOSIS — M25512 Pain in left shoulder: Secondary | ICD-10-CM | POA: Diagnosis not present

## 2019-11-17 DIAGNOSIS — M6281 Muscle weakness (generalized): Secondary | ICD-10-CM | POA: Diagnosis not present

## 2019-11-24 DIAGNOSIS — M25612 Stiffness of left shoulder, not elsewhere classified: Secondary | ICD-10-CM | POA: Diagnosis not present

## 2019-11-24 DIAGNOSIS — M6281 Muscle weakness (generalized): Secondary | ICD-10-CM | POA: Diagnosis not present

## 2019-11-24 DIAGNOSIS — M25512 Pain in left shoulder: Secondary | ICD-10-CM | POA: Diagnosis not present

## 2019-12-01 DIAGNOSIS — M6281 Muscle weakness (generalized): Secondary | ICD-10-CM | POA: Diagnosis not present

## 2019-12-01 DIAGNOSIS — M25612 Stiffness of left shoulder, not elsewhere classified: Secondary | ICD-10-CM | POA: Diagnosis not present

## 2019-12-01 DIAGNOSIS — M25512 Pain in left shoulder: Secondary | ICD-10-CM | POA: Diagnosis not present

## 2019-12-07 DIAGNOSIS — M6281 Muscle weakness (generalized): Secondary | ICD-10-CM | POA: Diagnosis not present

## 2019-12-07 DIAGNOSIS — M25612 Stiffness of left shoulder, not elsewhere classified: Secondary | ICD-10-CM | POA: Diagnosis not present

## 2019-12-07 DIAGNOSIS — M25512 Pain in left shoulder: Secondary | ICD-10-CM | POA: Diagnosis not present

## 2019-12-15 DIAGNOSIS — R0603 Acute respiratory distress: Secondary | ICD-10-CM | POA: Diagnosis not present

## 2019-12-15 DIAGNOSIS — T148XXA Other injury of unspecified body region, initial encounter: Secondary | ICD-10-CM | POA: Diagnosis not present

## 2019-12-15 DIAGNOSIS — S81011A Laceration without foreign body, right knee, initial encounter: Secondary | ICD-10-CM | POA: Diagnosis not present

## 2019-12-15 DIAGNOSIS — S43014A Anterior dislocation of right humerus, initial encounter: Secondary | ICD-10-CM | POA: Diagnosis not present

## 2019-12-15 DIAGNOSIS — S43004A Unspecified dislocation of right shoulder joint, initial encounter: Secondary | ICD-10-CM | POA: Diagnosis not present

## 2019-12-15 DIAGNOSIS — R0781 Pleurodynia: Secondary | ICD-10-CM | POA: Diagnosis not present

## 2019-12-15 DIAGNOSIS — S42301A Unspecified fracture of shaft of humerus, right arm, initial encounter for closed fracture: Secondary | ICD-10-CM | POA: Diagnosis not present

## 2019-12-15 DIAGNOSIS — S81012A Laceration without foreign body, left knee, initial encounter: Secondary | ICD-10-CM | POA: Diagnosis not present

## 2019-12-15 DIAGNOSIS — Z23 Encounter for immunization: Secondary | ICD-10-CM | POA: Diagnosis not present

## 2019-12-16 DIAGNOSIS — S43101A Unspecified dislocation of right acromioclavicular joint, initial encounter: Secondary | ICD-10-CM | POA: Diagnosis not present

## 2019-12-16 DIAGNOSIS — S43004A Unspecified dislocation of right shoulder joint, initial encounter: Secondary | ICD-10-CM | POA: Diagnosis not present

## 2019-12-16 DIAGNOSIS — R0603 Acute respiratory distress: Secondary | ICD-10-CM | POA: Diagnosis not present

## 2019-12-21 DIAGNOSIS — L820 Inflamed seborrheic keratosis: Secondary | ICD-10-CM | POA: Diagnosis not present

## 2019-12-25 ENCOUNTER — Ambulatory Visit: Payer: PPO | Attending: Internal Medicine

## 2019-12-25 DIAGNOSIS — Z23 Encounter for immunization: Secondary | ICD-10-CM

## 2019-12-25 NOTE — Progress Notes (Signed)
   Covid-19 Vaccination Clinic  Name:  [name]    MRN: [id number] DOB: [birth year]  12/25/2019  [name] was observed post Covid-19 immunization for 15 minutes without incidence. She was provided with Vaccine Information Sheet and instruction to access the V-Safe system.   [name] was instructed to call 911 with any severe reactions post vaccine: Marland Kitchen Difficulty breathing  . Swelling of your face and throat  . A fast heartbeat  . A bad rash all over your body  . Dizziness and weakness    Immunizations Administered    Name Date Dose VIS Date Route   Pfizer COVID-19 Vaccine 12/25/2019  2:14 PM 0.3 mL 10/07/2019 Intramuscular   Manufacturer: [place]   Lot: [id number]   [name]: [id number]

## 2019-12-28 DIAGNOSIS — G894 Chronic pain syndrome: Secondary | ICD-10-CM | POA: Diagnosis not present

## 2019-12-28 DIAGNOSIS — R030 Elevated blood-pressure reading, without diagnosis of hypertension: Secondary | ICD-10-CM | POA: Diagnosis not present

## 2019-12-28 DIAGNOSIS — M961 Postlaminectomy syndrome, not elsewhere classified: Secondary | ICD-10-CM | POA: Diagnosis not present

## 2019-12-28 DIAGNOSIS — Z9689 Presence of other specified functional implants: Secondary | ICD-10-CM | POA: Diagnosis not present

## 2020-01-16 DIAGNOSIS — M75102 Unspecified rotator cuff tear or rupture of left shoulder, not specified as traumatic: Secondary | ICD-10-CM | POA: Diagnosis not present

## 2020-01-16 DIAGNOSIS — M25512 Pain in left shoulder: Secondary | ICD-10-CM | POA: Diagnosis not present

## 2020-01-18 DIAGNOSIS — M75101 Unspecified rotator cuff tear or rupture of right shoulder, not specified as traumatic: Secondary | ICD-10-CM | POA: Diagnosis not present

## 2020-01-18 DIAGNOSIS — M25511 Pain in right shoulder: Secondary | ICD-10-CM | POA: Diagnosis not present

## 2020-01-19 DIAGNOSIS — J309 Allergic rhinitis, unspecified: Secondary | ICD-10-CM | POA: Diagnosis not present

## 2020-01-23 DIAGNOSIS — J439 Emphysema, unspecified: Secondary | ICD-10-CM | POA: Diagnosis not present

## 2020-01-23 DIAGNOSIS — R52 Pain, unspecified: Secondary | ICD-10-CM | POA: Diagnosis not present

## 2020-01-23 DIAGNOSIS — E559 Vitamin D deficiency, unspecified: Secondary | ICD-10-CM | POA: Diagnosis not present

## 2020-01-23 DIAGNOSIS — M79609 Pain in unspecified limb: Secondary | ICD-10-CM | POA: Diagnosis not present

## 2020-01-23 DIAGNOSIS — Z01818 Encounter for other preprocedural examination: Secondary | ICD-10-CM | POA: Diagnosis not present

## 2020-01-23 DIAGNOSIS — Z79899 Other long term (current) drug therapy: Secondary | ICD-10-CM | POA: Diagnosis not present

## 2020-01-23 LAB — POCT INR

## 2020-01-24 ENCOUNTER — Ambulatory Visit: Payer: PPO | Attending: Internal Medicine

## 2020-01-24 DIAGNOSIS — Z01818 Encounter for other preprocedural examination: Secondary | ICD-10-CM | POA: Diagnosis not present

## 2020-01-24 DIAGNOSIS — Z23 Encounter for immunization: Secondary | ICD-10-CM

## 2020-01-24 NOTE — Progress Notes (Signed)
   Covid-19 Vaccination Clinic  Name:  [name]    MRN: [id number] DOB: 1946-01-12  01/24/2020  [name] was observed post Covid-19 immunization for 15 minutes without incident. She was provided with Vaccine Information Sheet and instruction to access the V-Safe system.   [name] was instructed to call 911 with any severe reactions post vaccine: Marland Kitchen Difficulty breathing  . Swelling of face and throat  . A fast heartbeat  . A bad rash all over body  . Dizziness and weakness   Immunizations Administered    Name Date Dose VIS Date Route   Pfizer COVID-19 Vaccine 01/24/2020 12:54 PM 0.3 mL 10/07/2019 Intramuscular   Manufacturer: [place]   Lot: [id number]   [place]: [id number]

## 2020-01-26 DIAGNOSIS — M75101 Unspecified rotator cuff tear or rupture of right shoulder, not specified as traumatic: Secondary | ICD-10-CM | POA: Diagnosis not present

## 2020-01-26 DIAGNOSIS — M25511 Pain in right shoulder: Secondary | ICD-10-CM | POA: Diagnosis not present

## 2020-01-31 DIAGNOSIS — M75101 Unspecified rotator cuff tear or rupture of right shoulder, not specified as traumatic: Secondary | ICD-10-CM | POA: Diagnosis not present

## 2020-01-31 DIAGNOSIS — M25511 Pain in right shoulder: Secondary | ICD-10-CM | POA: Diagnosis not present

## 2020-02-06 DIAGNOSIS — M75101 Unspecified rotator cuff tear or rupture of right shoulder, not specified as traumatic: Secondary | ICD-10-CM | POA: Diagnosis not present

## 2020-02-06 DIAGNOSIS — M25511 Pain in right shoulder: Secondary | ICD-10-CM | POA: Diagnosis not present

## 2020-02-07 DIAGNOSIS — E782 Mixed hyperlipidemia: Secondary | ICD-10-CM | POA: Diagnosis not present

## 2020-02-08 ENCOUNTER — Telehealth: Payer: Self-pay | Admitting: Cardiovascular Disease

## 2020-02-08 DIAGNOSIS — M25511 Pain in right shoulder: Secondary | ICD-10-CM | POA: Diagnosis not present

## 2020-02-08 DIAGNOSIS — M75101 Unspecified rotator cuff tear or rupture of right shoulder, not specified as traumatic: Secondary | ICD-10-CM | POA: Diagnosis not present

## 2020-02-08 NOTE — Telephone Encounter (Signed)
   [facility] Pre-operative Risk Assessment    Request for surgical clearance:  1. What type of surgery is being performed? L Total Shoulder Replacement   2. When is this surgery scheduled? TBD Based on Surgery  3. What type of clearance is required (medical clearance vs. Pharmacy clearance to hold med vs. Both)? Both  4. Are there any medications that need to be held prior to surgery and how long?Up to Cardiology  5. Practice name and name of physician performing surgery? [facility] Yaste, [place]   6. What is your office phone number: [phone]   7.   What is your office fax number: [phone]   8.   Anesthesia type (None, local, MAC, general) ? general  [place] wanted to know if she needed to be seen again prior to surgery  Johnna Acosta 02/08/2020, 3:27 PM  _________________________________________________________________   (provider comments below)

## 2020-02-08 NOTE — Telephone Encounter (Signed)
   Primary Cardiologist: Quay Burow, MD  Chart reviewed as part of pre-operative protocol coverage. Patient was contacted 02/08/2020 in reference to pre-operative risk assessment for pending surgery as outlined below.  [name] was last seen on 09/19/2019 by Dr. Gwenlyn Found.  Since that day, [name] has done well from a cardiac standpoint. She has no complaints of chest pain, SOB, or palpitations since her last visit. She can complete 4 METs without anginal complaints.  Therefore, based on ACC/AHA guidelines, the patient would be at acceptable risk for the planned procedure without further cardiovascular testing.   I will route this recommendation to the requesting party via Epic fax function and remove from pre-op pool.  Please call with questions.  Abigail Butts, PA-C 02/08/2020, 3:57 PM

## 2020-02-09 NOTE — Telephone Encounter (Addendum)
Received fax back from [place] in regards to surgical clearance.    Attached an EKG from 3/30 "shows tachycardia. Anesthesia is wanting to make sure this is okay for patients upcoming surgery.  Please advise".  EKG HR 118   Will have EKG scanned into media for review.   Routed back to preop pool to address.

## 2020-02-10 NOTE — Telephone Encounter (Addendum)
I reviewed EKG from 3/30 that was scanned in. Shows sinus tachycardia, rate 118, with no acute ST/T changes. Reviewed chart. Looks like she is often is in mild sinus tachycardia. At last visit with Dr. Gwenlyn Found, she was in sinus tach with rate of 111 bpm. She is on Coreg. She should take this on morning of procedure. Recommend monitoring patient on telemetry throughout peri-operative period but still think patient is OK to proceed with surgery as previously stated.   Pre-op covering staff, can you please notify Anesthesia.   Thank you!

## 2020-02-10 NOTE — Telephone Encounter (Signed)
I tried to reach the orthopedic office to go over recommendations per our pre op team, though recording states their office is closed. We will reach back out to the office on Monday 02/13/20 to go over recommendations. I will fax these notes as well to requesting provider office.   Side note: Need to make a correction on the HR that pre op provider stated was 11 bpm. At last visit with Dr. Gwenlyn Found, she was in sinus tach with rate of 11 bpm.   EKG 11/24 shows HR was 111 bpm not 11 bpm.

## 2020-02-13 NOTE — Telephone Encounter (Signed)
I left a very detailed message for Ivin Booty, surgery scheduler the recommendations per pre op team. Faxed notes last Friday 02/10/20. I will fax updated notes from today 02/13/20. Please call the office if any questions. I will remove from the pre op call back pool.

## 2020-02-14 DIAGNOSIS — M75101 Unspecified rotator cuff tear or rupture of right shoulder, not specified as traumatic: Secondary | ICD-10-CM | POA: Diagnosis not present

## 2020-02-14 DIAGNOSIS — M25511 Pain in right shoulder: Secondary | ICD-10-CM | POA: Diagnosis not present

## 2020-02-15 DIAGNOSIS — L820 Inflamed seborrheic keratosis: Secondary | ICD-10-CM | POA: Diagnosis not present

## 2020-02-15 DIAGNOSIS — D1801 Hemangioma of skin and subcutaneous tissue: Secondary | ICD-10-CM | POA: Diagnosis not present

## 2020-02-15 DIAGNOSIS — D2239 Melanocytic nevi of other parts of face: Secondary | ICD-10-CM | POA: Diagnosis not present

## 2020-02-15 DIAGNOSIS — D225 Melanocytic nevi of trunk: Secondary | ICD-10-CM | POA: Diagnosis not present

## 2020-02-15 DIAGNOSIS — Z85820 Personal history of malignant melanoma of skin: Secondary | ICD-10-CM | POA: Diagnosis not present

## 2020-02-15 DIAGNOSIS — D485 Neoplasm of uncertain behavior of skin: Secondary | ICD-10-CM | POA: Diagnosis not present

## 2020-02-16 DIAGNOSIS — M25511 Pain in right shoulder: Secondary | ICD-10-CM | POA: Diagnosis not present

## 2020-02-16 DIAGNOSIS — M75101 Unspecified rotator cuff tear or rupture of right shoulder, not specified as traumatic: Secondary | ICD-10-CM | POA: Diagnosis not present

## 2020-02-21 DIAGNOSIS — I2510 Atherosclerotic heart disease of native coronary artery without angina pectoris: Secondary | ICD-10-CM | POA: Diagnosis not present

## 2020-02-21 DIAGNOSIS — G459 Transient cerebral ischemic attack, unspecified: Secondary | ICD-10-CM | POA: Diagnosis not present

## 2020-02-21 DIAGNOSIS — J439 Emphysema, unspecified: Secondary | ICD-10-CM | POA: Diagnosis not present

## 2020-02-21 DIAGNOSIS — K219 Gastro-esophageal reflux disease without esophagitis: Secondary | ICD-10-CM | POA: Diagnosis not present

## 2020-02-21 DIAGNOSIS — E039 Hypothyroidism, unspecified: Secondary | ICD-10-CM | POA: Diagnosis not present

## 2020-02-21 DIAGNOSIS — I700 Atherosclerosis of aorta: Secondary | ICD-10-CM | POA: Diagnosis not present

## 2020-02-29 DIAGNOSIS — Z1159 Encounter for screening for other viral diseases: Secondary | ICD-10-CM | POA: Diagnosis not present

## 2020-02-29 DIAGNOSIS — Z79899 Other long term (current) drug therapy: Secondary | ICD-10-CM | POA: Diagnosis not present

## 2020-02-29 DIAGNOSIS — Z1152 Encounter for screening for COVID-19: Secondary | ICD-10-CM | POA: Diagnosis not present

## 2020-02-29 DIAGNOSIS — M79609 Pain in unspecified limb: Secondary | ICD-10-CM | POA: Diagnosis not present

## 2020-02-29 DIAGNOSIS — R52 Pain, unspecified: Secondary | ICD-10-CM | POA: Diagnosis not present

## 2020-02-29 DIAGNOSIS — Z01818 Encounter for other preprocedural examination: Secondary | ICD-10-CM | POA: Diagnosis not present

## 2020-02-29 DIAGNOSIS — E559 Vitamin D deficiency, unspecified: Secondary | ICD-10-CM | POA: Diagnosis not present

## 2020-03-01 DIAGNOSIS — Z6825 Body mass index (BMI) 25.0-25.9, adult: Secondary | ICD-10-CM | POA: Diagnosis not present

## 2020-03-01 DIAGNOSIS — M150 Primary generalized (osteo)arthritis: Secondary | ICD-10-CM | POA: Diagnosis not present

## 2020-03-01 DIAGNOSIS — L659 Nonscarring hair loss, unspecified: Secondary | ICD-10-CM | POA: Diagnosis not present

## 2020-03-01 DIAGNOSIS — E663 Overweight: Secondary | ICD-10-CM | POA: Diagnosis not present

## 2020-03-01 DIAGNOSIS — M25512 Pain in left shoulder: Secondary | ICD-10-CM | POA: Diagnosis not present

## 2020-03-01 DIAGNOSIS — R410 Disorientation, unspecified: Secondary | ICD-10-CM | POA: Diagnosis not present

## 2020-03-01 DIAGNOSIS — I7300 Raynaud's syndrome without gangrene: Secondary | ICD-10-CM | POA: Diagnosis not present

## 2020-03-01 DIAGNOSIS — M0609 Rheumatoid arthritis without rheumatoid factor, multiple sites: Secondary | ICD-10-CM | POA: Diagnosis not present

## 2020-03-01 DIAGNOSIS — I519 Heart disease, unspecified: Secondary | ICD-10-CM | POA: Diagnosis not present

## 2020-03-06 DIAGNOSIS — G8918 Other acute postprocedural pain: Secondary | ICD-10-CM | POA: Diagnosis not present

## 2020-03-06 DIAGNOSIS — Z471 Aftercare following joint replacement surgery: Secondary | ICD-10-CM | POA: Diagnosis not present

## 2020-03-06 DIAGNOSIS — K219 Gastro-esophageal reflux disease without esophagitis: Secondary | ICD-10-CM | POA: Diagnosis not present

## 2020-03-06 DIAGNOSIS — G4700 Insomnia, unspecified: Secondary | ICD-10-CM | POA: Diagnosis not present

## 2020-03-06 DIAGNOSIS — M069 Rheumatoid arthritis, unspecified: Secondary | ICD-10-CM | POA: Diagnosis not present

## 2020-03-06 DIAGNOSIS — Z87891 Personal history of nicotine dependence: Secondary | ICD-10-CM | POA: Diagnosis not present

## 2020-03-06 DIAGNOSIS — R000 Tachycardia, unspecified: Secondary | ICD-10-CM | POA: Diagnosis not present

## 2020-03-06 DIAGNOSIS — Z79899 Other long term (current) drug therapy: Secondary | ICD-10-CM | POA: Diagnosis not present

## 2020-03-06 DIAGNOSIS — M75102 Unspecified rotator cuff tear or rupture of left shoulder, not specified as traumatic: Secondary | ICD-10-CM | POA: Diagnosis not present

## 2020-03-06 DIAGNOSIS — M25512 Pain in left shoulder: Secondary | ICD-10-CM | POA: Diagnosis not present

## 2020-03-06 DIAGNOSIS — I10 Essential (primary) hypertension: Secondary | ICD-10-CM | POA: Diagnosis not present

## 2020-03-06 DIAGNOSIS — E039 Hypothyroidism, unspecified: Secondary | ICD-10-CM | POA: Diagnosis not present

## 2020-03-06 DIAGNOSIS — Z96612 Presence of left artificial shoulder joint: Secondary | ICD-10-CM | POA: Diagnosis not present

## 2020-03-06 DIAGNOSIS — M19012 Primary osteoarthritis, left shoulder: Secondary | ICD-10-CM | POA: Diagnosis not present

## 2020-03-06 DIAGNOSIS — M12812 Other specific arthropathies, not elsewhere classified, left shoulder: Secondary | ICD-10-CM | POA: Diagnosis not present

## 2020-03-06 DIAGNOSIS — J449 Chronic obstructive pulmonary disease, unspecified: Secondary | ICD-10-CM | POA: Diagnosis not present

## 2020-03-06 DIAGNOSIS — G2581 Restless legs syndrome: Secondary | ICD-10-CM | POA: Diagnosis not present

## 2020-03-13 DIAGNOSIS — M25512 Pain in left shoulder: Secondary | ICD-10-CM | POA: Diagnosis not present

## 2020-03-13 DIAGNOSIS — M6281 Muscle weakness (generalized): Secondary | ICD-10-CM | POA: Diagnosis not present

## 2020-03-16 DIAGNOSIS — M25512 Pain in left shoulder: Secondary | ICD-10-CM | POA: Diagnosis not present

## 2020-03-16 DIAGNOSIS — M6281 Muscle weakness (generalized): Secondary | ICD-10-CM | POA: Diagnosis not present

## 2020-03-20 DIAGNOSIS — M25512 Pain in left shoulder: Secondary | ICD-10-CM | POA: Diagnosis not present

## 2020-03-20 DIAGNOSIS — M6281 Muscle weakness (generalized): Secondary | ICD-10-CM | POA: Diagnosis not present

## 2020-03-22 DIAGNOSIS — M25512 Pain in left shoulder: Secondary | ICD-10-CM | POA: Diagnosis not present

## 2020-03-22 DIAGNOSIS — M6281 Muscle weakness (generalized): Secondary | ICD-10-CM | POA: Diagnosis not present

## 2020-03-28 DIAGNOSIS — M6281 Muscle weakness (generalized): Secondary | ICD-10-CM | POA: Diagnosis not present

## 2020-03-28 DIAGNOSIS — M25512 Pain in left shoulder: Secondary | ICD-10-CM | POA: Diagnosis not present

## 2020-03-30 DIAGNOSIS — M6281 Muscle weakness (generalized): Secondary | ICD-10-CM | POA: Diagnosis not present

## 2020-03-30 DIAGNOSIS — M25512 Pain in left shoulder: Secondary | ICD-10-CM | POA: Diagnosis not present

## 2020-04-02 DIAGNOSIS — S43004S Unspecified dislocation of right shoulder joint, sequela: Secondary | ICD-10-CM | POA: Diagnosis not present

## 2020-04-02 DIAGNOSIS — M25511 Pain in right shoulder: Secondary | ICD-10-CM | POA: Diagnosis not present

## 2020-04-02 DIAGNOSIS — S46011A Strain of muscle(s) and tendon(s) of the rotator cuff of right shoulder, initial encounter: Secondary | ICD-10-CM | POA: Diagnosis not present

## 2020-04-03 DIAGNOSIS — M25512 Pain in left shoulder: Secondary | ICD-10-CM | POA: Diagnosis not present

## 2020-04-03 DIAGNOSIS — M6281 Muscle weakness (generalized): Secondary | ICD-10-CM | POA: Diagnosis not present

## 2020-04-05 DIAGNOSIS — M6281 Muscle weakness (generalized): Secondary | ICD-10-CM | POA: Diagnosis not present

## 2020-04-05 DIAGNOSIS — M25512 Pain in left shoulder: Secondary | ICD-10-CM | POA: Diagnosis not present

## 2020-04-09 DIAGNOSIS — M25512 Pain in left shoulder: Secondary | ICD-10-CM | POA: Diagnosis not present

## 2020-04-09 DIAGNOSIS — M6281 Muscle weakness (generalized): Secondary | ICD-10-CM | POA: Diagnosis not present

## 2020-04-12 DIAGNOSIS — S43004S Unspecified dislocation of right shoulder joint, sequela: Secondary | ICD-10-CM | POA: Diagnosis not present

## 2020-04-16 DIAGNOSIS — M6281 Muscle weakness (generalized): Secondary | ICD-10-CM | POA: Diagnosis not present

## 2020-04-16 DIAGNOSIS — M25512 Pain in left shoulder: Secondary | ICD-10-CM | POA: Diagnosis not present

## 2020-04-18 DIAGNOSIS — M6281 Muscle weakness (generalized): Secondary | ICD-10-CM | POA: Diagnosis not present

## 2020-04-18 DIAGNOSIS — M25512 Pain in left shoulder: Secondary | ICD-10-CM | POA: Diagnosis not present

## 2020-04-24 DIAGNOSIS — M25512 Pain in left shoulder: Secondary | ICD-10-CM | POA: Diagnosis not present

## 2020-04-24 DIAGNOSIS — M6281 Muscle weakness (generalized): Secondary | ICD-10-CM | POA: Diagnosis not present

## 2020-04-26 DIAGNOSIS — M25512 Pain in left shoulder: Secondary | ICD-10-CM | POA: Diagnosis not present

## 2020-04-26 DIAGNOSIS — M6281 Muscle weakness (generalized): Secondary | ICD-10-CM | POA: Diagnosis not present

## 2020-05-01 DIAGNOSIS — M6281 Muscle weakness (generalized): Secondary | ICD-10-CM | POA: Diagnosis not present

## 2020-05-01 DIAGNOSIS — M25512 Pain in left shoulder: Secondary | ICD-10-CM | POA: Diagnosis not present

## 2020-05-03 DIAGNOSIS — M25512 Pain in left shoulder: Secondary | ICD-10-CM | POA: Diagnosis not present

## 2020-05-03 DIAGNOSIS — M6281 Muscle weakness (generalized): Secondary | ICD-10-CM | POA: Diagnosis not present

## 2020-05-07 DIAGNOSIS — H43393 Other vitreous opacities, bilateral: Secondary | ICD-10-CM | POA: Diagnosis not present

## 2020-05-07 DIAGNOSIS — H40033 Anatomical narrow angle, bilateral: Secondary | ICD-10-CM | POA: Diagnosis not present

## 2020-05-08 DIAGNOSIS — M6281 Muscle weakness (generalized): Secondary | ICD-10-CM | POA: Diagnosis not present

## 2020-05-08 DIAGNOSIS — M25512 Pain in left shoulder: Secondary | ICD-10-CM | POA: Diagnosis not present

## 2020-05-10 DIAGNOSIS — M6281 Muscle weakness (generalized): Secondary | ICD-10-CM | POA: Diagnosis not present

## 2020-05-10 DIAGNOSIS — M25512 Pain in left shoulder: Secondary | ICD-10-CM | POA: Diagnosis not present

## 2020-05-15 DIAGNOSIS — M6281 Muscle weakness (generalized): Secondary | ICD-10-CM | POA: Diagnosis not present

## 2020-05-15 DIAGNOSIS — M25512 Pain in left shoulder: Secondary | ICD-10-CM | POA: Diagnosis not present

## 2020-05-17 DIAGNOSIS — M6281 Muscle weakness (generalized): Secondary | ICD-10-CM | POA: Diagnosis not present

## 2020-05-17 DIAGNOSIS — M25512 Pain in left shoulder: Secondary | ICD-10-CM | POA: Diagnosis not present

## 2020-05-22 DIAGNOSIS — M6281 Muscle weakness (generalized): Secondary | ICD-10-CM | POA: Diagnosis not present

## 2020-05-22 DIAGNOSIS — M25512 Pain in left shoulder: Secondary | ICD-10-CM | POA: Diagnosis not present

## 2020-05-24 DIAGNOSIS — M25512 Pain in left shoulder: Secondary | ICD-10-CM | POA: Diagnosis not present

## 2020-05-24 DIAGNOSIS — M6281 Muscle weakness (generalized): Secondary | ICD-10-CM | POA: Diagnosis not present

## 2020-05-31 DIAGNOSIS — M6281 Muscle weakness (generalized): Secondary | ICD-10-CM | POA: Diagnosis not present

## 2020-05-31 DIAGNOSIS — M25512 Pain in left shoulder: Secondary | ICD-10-CM | POA: Diagnosis not present

## 2020-06-06 DIAGNOSIS — M6281 Muscle weakness (generalized): Secondary | ICD-10-CM | POA: Diagnosis not present

## 2020-06-06 DIAGNOSIS — M25512 Pain in left shoulder: Secondary | ICD-10-CM | POA: Diagnosis not present

## 2020-06-19 DIAGNOSIS — L819 Disorder of pigmentation, unspecified: Secondary | ICD-10-CM | POA: Diagnosis not present

## 2020-06-19 DIAGNOSIS — L659 Nonscarring hair loss, unspecified: Secondary | ICD-10-CM | POA: Diagnosis not present

## 2020-06-19 DIAGNOSIS — R410 Disorientation, unspecified: Secondary | ICD-10-CM | POA: Diagnosis not present

## 2020-06-19 DIAGNOSIS — M25512 Pain in left shoulder: Secondary | ICD-10-CM | POA: Diagnosis not present

## 2020-06-19 DIAGNOSIS — I7300 Raynaud's syndrome without gangrene: Secondary | ICD-10-CM | POA: Diagnosis not present

## 2020-06-19 DIAGNOSIS — I519 Heart disease, unspecified: Secondary | ICD-10-CM | POA: Diagnosis not present

## 2020-06-19 DIAGNOSIS — E663 Overweight: Secondary | ICD-10-CM | POA: Diagnosis not present

## 2020-06-19 DIAGNOSIS — M0609 Rheumatoid arthritis without rheumatoid factor, multiple sites: Secondary | ICD-10-CM | POA: Diagnosis not present

## 2020-06-19 DIAGNOSIS — Z6825 Body mass index (BMI) 25.0-25.9, adult: Secondary | ICD-10-CM | POA: Diagnosis not present

## 2020-06-19 DIAGNOSIS — M150 Primary generalized (osteo)arthritis: Secondary | ICD-10-CM | POA: Diagnosis not present

## 2020-06-20 DIAGNOSIS — M25512 Pain in left shoulder: Secondary | ICD-10-CM | POA: Diagnosis not present

## 2020-06-20 DIAGNOSIS — M6281 Muscle weakness (generalized): Secondary | ICD-10-CM | POA: Diagnosis not present

## 2020-06-27 DIAGNOSIS — M25512 Pain in left shoulder: Secondary | ICD-10-CM | POA: Diagnosis not present

## 2020-06-27 DIAGNOSIS — M6281 Muscle weakness (generalized): Secondary | ICD-10-CM | POA: Diagnosis not present

## 2020-07-03 DIAGNOSIS — D225 Melanocytic nevi of trunk: Secondary | ICD-10-CM | POA: Diagnosis not present

## 2020-07-03 DIAGNOSIS — L821 Other seborrheic keratosis: Secondary | ICD-10-CM | POA: Diagnosis not present

## 2020-07-03 DIAGNOSIS — R233 Spontaneous ecchymoses: Secondary | ICD-10-CM | POA: Diagnosis not present

## 2020-07-11 DIAGNOSIS — M6281 Muscle weakness (generalized): Secondary | ICD-10-CM | POA: Diagnosis not present

## 2020-07-11 DIAGNOSIS — M25512 Pain in left shoulder: Secondary | ICD-10-CM | POA: Diagnosis not present

## 2020-07-12 DIAGNOSIS — M75102 Unspecified rotator cuff tear or rupture of left shoulder, not specified as traumatic: Secondary | ICD-10-CM | POA: Diagnosis not present

## 2020-07-12 DIAGNOSIS — M25512 Pain in left shoulder: Secondary | ICD-10-CM | POA: Diagnosis not present

## 2020-07-18 DIAGNOSIS — M19049 Primary osteoarthritis, unspecified hand: Secondary | ICD-10-CM | POA: Diagnosis not present

## 2020-07-18 DIAGNOSIS — M19041 Primary osteoarthritis, right hand: Secondary | ICD-10-CM | POA: Diagnosis not present

## 2020-07-18 DIAGNOSIS — M19042 Primary osteoarthritis, left hand: Secondary | ICD-10-CM | POA: Diagnosis not present

## 2020-07-18 DIAGNOSIS — M0579 Rheumatoid arthritis with rheumatoid factor of multiple sites without organ or systems involvement: Secondary | ICD-10-CM | POA: Diagnosis not present

## 2020-07-19 DIAGNOSIS — M25512 Pain in left shoulder: Secondary | ICD-10-CM | POA: Diagnosis not present

## 2020-07-19 DIAGNOSIS — M6281 Muscle weakness (generalized): Secondary | ICD-10-CM | POA: Diagnosis not present

## 2020-07-26 DIAGNOSIS — M25512 Pain in left shoulder: Secondary | ICD-10-CM | POA: Diagnosis not present

## 2020-07-26 DIAGNOSIS — M6281 Muscle weakness (generalized): Secondary | ICD-10-CM | POA: Diagnosis not present

## 2020-07-27 DIAGNOSIS — R351 Nocturia: Secondary | ICD-10-CM | POA: Diagnosis not present

## 2020-07-27 DIAGNOSIS — R350 Frequency of micturition: Secondary | ICD-10-CM | POA: Diagnosis not present

## 2020-07-27 DIAGNOSIS — N3946 Mixed incontinence: Secondary | ICD-10-CM | POA: Diagnosis not present

## 2020-08-01 DIAGNOSIS — M25512 Pain in left shoulder: Secondary | ICD-10-CM | POA: Diagnosis not present

## 2020-08-01 DIAGNOSIS — M6281 Muscle weakness (generalized): Secondary | ICD-10-CM | POA: Diagnosis not present

## 2020-08-08 DIAGNOSIS — M25512 Pain in left shoulder: Secondary | ICD-10-CM | POA: Diagnosis not present

## 2020-08-08 DIAGNOSIS — M6281 Muscle weakness (generalized): Secondary | ICD-10-CM | POA: Diagnosis not present

## 2020-08-15 DIAGNOSIS — M6281 Muscle weakness (generalized): Secondary | ICD-10-CM | POA: Diagnosis not present

## 2020-08-15 DIAGNOSIS — M25512 Pain in left shoulder: Secondary | ICD-10-CM | POA: Diagnosis not present

## 2020-08-29 DIAGNOSIS — M6281 Muscle weakness (generalized): Secondary | ICD-10-CM | POA: Diagnosis not present

## 2020-08-29 DIAGNOSIS — M25512 Pain in left shoulder: Secondary | ICD-10-CM | POA: Diagnosis not present

## 2020-09-05 DIAGNOSIS — M25512 Pain in left shoulder: Secondary | ICD-10-CM | POA: Diagnosis not present

## 2020-09-05 DIAGNOSIS — M6281 Muscle weakness (generalized): Secondary | ICD-10-CM | POA: Diagnosis not present

## 2020-09-10 DIAGNOSIS — L819 Disorder of pigmentation, unspecified: Secondary | ICD-10-CM | POA: Diagnosis not present

## 2020-09-10 DIAGNOSIS — M150 Primary generalized (osteo)arthritis: Secondary | ICD-10-CM | POA: Diagnosis not present

## 2020-09-10 DIAGNOSIS — M0609 Rheumatoid arthritis without rheumatoid factor, multiple sites: Secondary | ICD-10-CM | POA: Diagnosis not present

## 2020-09-10 DIAGNOSIS — R430 Anosmia: Secondary | ICD-10-CM | POA: Diagnosis not present

## 2020-09-10 DIAGNOSIS — L659 Nonscarring hair loss, unspecified: Secondary | ICD-10-CM | POA: Diagnosis not present

## 2020-09-10 DIAGNOSIS — M25512 Pain in left shoulder: Secondary | ICD-10-CM | POA: Diagnosis not present

## 2020-09-10 DIAGNOSIS — I7300 Raynaud's syndrome without gangrene: Secondary | ICD-10-CM | POA: Diagnosis not present

## 2020-09-10 DIAGNOSIS — Z6823 Body mass index (BMI) 23.0-23.9, adult: Secondary | ICD-10-CM | POA: Diagnosis not present

## 2020-09-10 DIAGNOSIS — R410 Disorientation, unspecified: Secondary | ICD-10-CM | POA: Diagnosis not present

## 2020-09-10 DIAGNOSIS — I519 Heart disease, unspecified: Secondary | ICD-10-CM | POA: Diagnosis not present

## 2020-09-12 DIAGNOSIS — M6281 Muscle weakness (generalized): Secondary | ICD-10-CM | POA: Diagnosis not present

## 2020-09-12 DIAGNOSIS — M25512 Pain in left shoulder: Secondary | ICD-10-CM | POA: Diagnosis not present

## 2020-09-26 DIAGNOSIS — N3946 Mixed incontinence: Secondary | ICD-10-CM | POA: Diagnosis not present

## 2020-09-26 DIAGNOSIS — R350 Frequency of micturition: Secondary | ICD-10-CM | POA: Diagnosis not present

## 2020-10-08 DIAGNOSIS — M6281 Muscle weakness (generalized): Secondary | ICD-10-CM | POA: Diagnosis not present

## 2020-10-08 DIAGNOSIS — M25512 Pain in left shoulder: Secondary | ICD-10-CM | POA: Diagnosis not present

## 2020-10-11 DIAGNOSIS — M25512 Pain in left shoulder: Secondary | ICD-10-CM | POA: Diagnosis not present

## 2020-11-27 DIAGNOSIS — L259 Unspecified contact dermatitis, unspecified cause: Secondary | ICD-10-CM | POA: Diagnosis not present

## 2020-12-05 DIAGNOSIS — R350 Frequency of micturition: Secondary | ICD-10-CM | POA: Diagnosis not present

## 2020-12-05 DIAGNOSIS — N3946 Mixed incontinence: Secondary | ICD-10-CM | POA: Diagnosis not present

## 2020-12-27 DIAGNOSIS — L851 Acquired keratosis [keratoderma] palmaris et plantaris: Secondary | ICD-10-CM | POA: Diagnosis not present

## 2021-03-05 DIAGNOSIS — M0609 Rheumatoid arthritis without rheumatoid factor, multiple sites: Secondary | ICD-10-CM | POA: Diagnosis not present

## 2021-03-05 DIAGNOSIS — R410 Disorientation, unspecified: Secondary | ICD-10-CM | POA: Diagnosis not present

## 2021-03-05 DIAGNOSIS — I7300 Raynaud's syndrome without gangrene: Secondary | ICD-10-CM | POA: Diagnosis not present

## 2021-03-05 DIAGNOSIS — L659 Nonscarring hair loss, unspecified: Secondary | ICD-10-CM | POA: Diagnosis not present

## 2021-03-05 DIAGNOSIS — R430 Anosmia: Secondary | ICD-10-CM | POA: Diagnosis not present

## 2021-03-05 DIAGNOSIS — L819 Disorder of pigmentation, unspecified: Secondary | ICD-10-CM | POA: Diagnosis not present

## 2021-03-05 DIAGNOSIS — Z6821 Body mass index (BMI) 21.0-21.9, adult: Secondary | ICD-10-CM | POA: Diagnosis not present

## 2021-03-05 DIAGNOSIS — R059 Cough, unspecified: Secondary | ICD-10-CM | POA: Diagnosis not present

## 2021-03-05 DIAGNOSIS — I519 Heart disease, unspecified: Secondary | ICD-10-CM | POA: Diagnosis not present

## 2021-03-05 DIAGNOSIS — M150 Primary generalized (osteo)arthritis: Secondary | ICD-10-CM | POA: Diagnosis not present

## 2021-03-05 DIAGNOSIS — M25512 Pain in left shoulder: Secondary | ICD-10-CM | POA: Diagnosis not present

## 2021-03-11 DIAGNOSIS — M75101 Unspecified rotator cuff tear or rupture of right shoulder, not specified as traumatic: Secondary | ICD-10-CM | POA: Diagnosis not present

## 2021-03-28 DIAGNOSIS — R300 Dysuria: Secondary | ICD-10-CM | POA: Diagnosis not present

## 2021-03-28 DIAGNOSIS — I519 Heart disease, unspecified: Secondary | ICD-10-CM | POA: Diagnosis not present

## 2021-03-28 DIAGNOSIS — Z6821 Body mass index (BMI) 21.0-21.9, adult: Secondary | ICD-10-CM | POA: Diagnosis not present

## 2021-03-28 DIAGNOSIS — L659 Nonscarring hair loss, unspecified: Secondary | ICD-10-CM | POA: Diagnosis not present

## 2021-03-28 DIAGNOSIS — L819 Disorder of pigmentation, unspecified: Secondary | ICD-10-CM | POA: Diagnosis not present

## 2021-03-28 DIAGNOSIS — M0609 Rheumatoid arthritis without rheumatoid factor, multiple sites: Secondary | ICD-10-CM | POA: Diagnosis not present

## 2021-03-28 DIAGNOSIS — M150 Primary generalized (osteo)arthritis: Secondary | ICD-10-CM | POA: Diagnosis not present

## 2021-03-28 DIAGNOSIS — M25512 Pain in left shoulder: Secondary | ICD-10-CM | POA: Diagnosis not present

## 2021-03-28 DIAGNOSIS — I7300 Raynaud's syndrome without gangrene: Secondary | ICD-10-CM | POA: Diagnosis not present

## 2021-03-28 DIAGNOSIS — R430 Anosmia: Secondary | ICD-10-CM | POA: Diagnosis not present

## 2021-03-28 DIAGNOSIS — R059 Cough, unspecified: Secondary | ICD-10-CM | POA: Diagnosis not present

## 2021-03-28 DIAGNOSIS — R410 Disorientation, unspecified: Secondary | ICD-10-CM | POA: Diagnosis not present

## 2021-05-08 DIAGNOSIS — M0609 Rheumatoid arthritis without rheumatoid factor, multiple sites: Secondary | ICD-10-CM | POA: Diagnosis not present

## 2021-05-09 DIAGNOSIS — L820 Inflamed seborrheic keratosis: Secondary | ICD-10-CM | POA: Diagnosis not present

## 2021-05-09 DIAGNOSIS — L304 Erythema intertrigo: Secondary | ICD-10-CM | POA: Diagnosis not present

## 2021-05-13 DIAGNOSIS — T148XXA Other injury of unspecified body region, initial encounter: Secondary | ICD-10-CM | POA: Diagnosis not present

## 2021-07-11 DIAGNOSIS — Z6822 Body mass index (BMI) 22.0-22.9, adult: Secondary | ICD-10-CM | POA: Diagnosis not present

## 2021-07-11 DIAGNOSIS — M150 Primary generalized (osteo)arthritis: Secondary | ICD-10-CM | POA: Diagnosis not present

## 2021-07-11 DIAGNOSIS — I7300 Raynaud's syndrome without gangrene: Secondary | ICD-10-CM | POA: Diagnosis not present

## 2021-07-11 DIAGNOSIS — M25512 Pain in left shoulder: Secondary | ICD-10-CM | POA: Diagnosis not present

## 2021-07-11 DIAGNOSIS — L659 Nonscarring hair loss, unspecified: Secondary | ICD-10-CM | POA: Diagnosis not present

## 2021-08-30 IMAGING — RF DG THORACIC SPINE 1V
1 series · 1 of 1 positions shown · non-contrast
Comparison: None.

CLINICAL DATA: Lumbar stimulator placement.

EXAM:
OPERATIVE THORACIC SPINE 1 VIEW(S).

[Series 1: run · 1 of 1 slices shown]
[im 1/1]
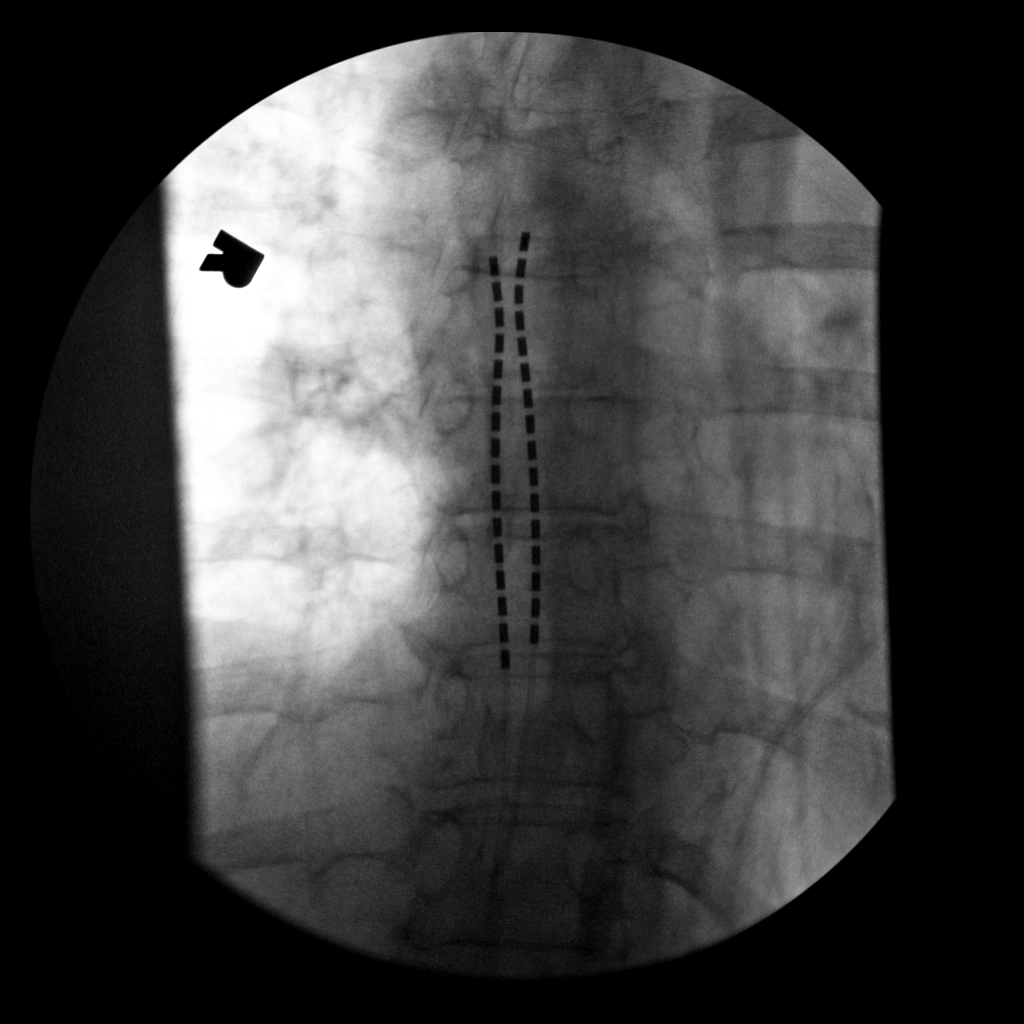

[1 of 1 positions shown; findings below may reference images not displayed]

FINDINGS: Single frontal spot fluoroscopic image over the thoracic spine
demonstrates patient's lumbar placed neurostimulator two leads
terminating over the midline midthoracic spine at the approximate
T7-8 level. Recommend correlation with findings at the time of the
procedure.
IMPRESSION: Neurostimulator device with tip over the midline midthoracic spine.

## 2021-08-30 IMAGING — RF DG C-ARM 1-60 MIN
1 series · 1 of 1 positions shown · non-contrast
Comparison: None.

CLINICAL DATA: Lumbar stimulator placement.

EXAM:
OPERATIVE THORACIC SPINE 1 VIEW(S).

[Series 1: run · 1 of 1 slices shown]
[im 1/1]
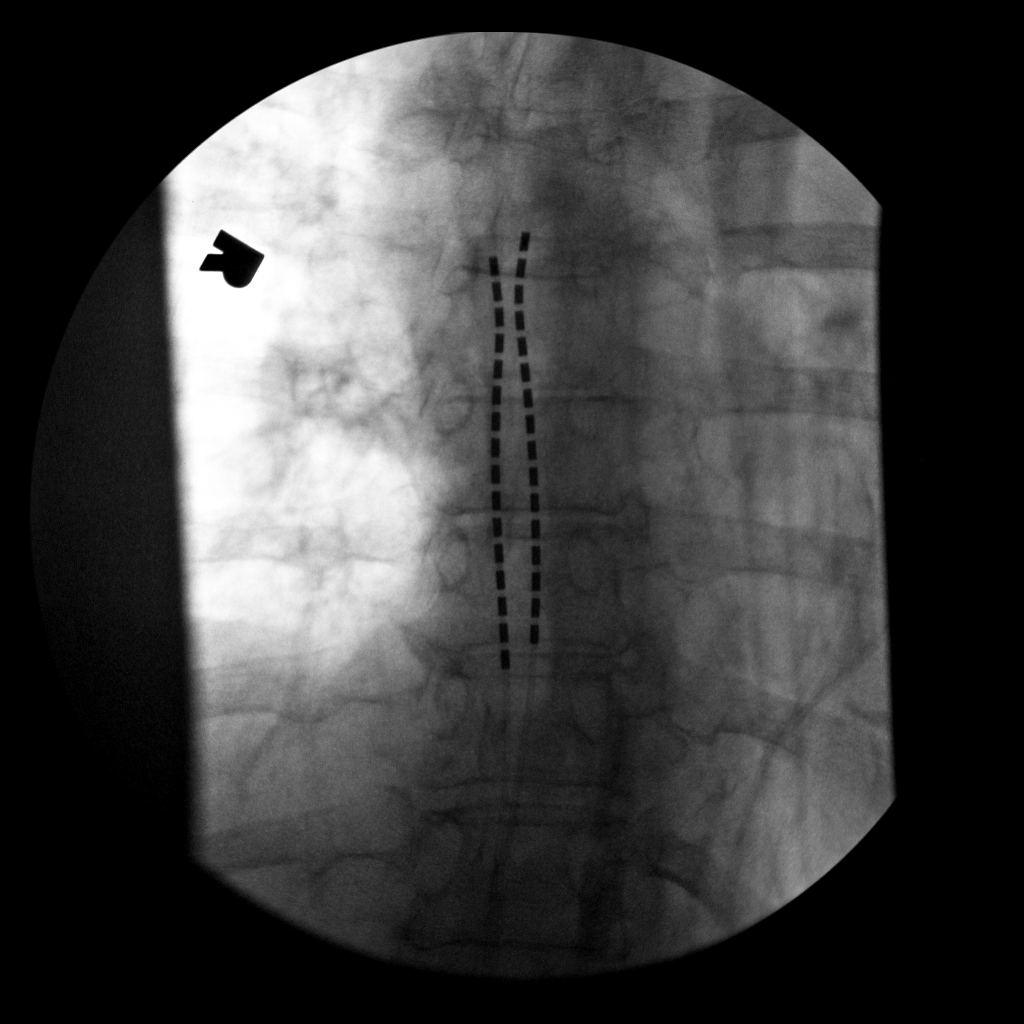

[1 of 1 positions shown; findings below may reference images not displayed]

FINDINGS: Single frontal spot fluoroscopic image over the thoracic spine
demonstrates patient's lumbar placed neurostimulator two leads
terminating over the midline midthoracic spine at the approximate
T7-8 level. Recommend correlation with findings at the time of the
procedure.
IMPRESSION: Neurostimulator device with tip over the midline midthoracic spine.

## 2021-09-06 DIAGNOSIS — I272 Pulmonary hypertension, unspecified: Secondary | ICD-10-CM | POA: Diagnosis not present

## 2021-09-06 DIAGNOSIS — I519 Heart disease, unspecified: Secondary | ICD-10-CM | POA: Diagnosis not present

## 2021-09-06 DIAGNOSIS — R7303 Prediabetes: Secondary | ICD-10-CM | POA: Diagnosis not present

## 2021-09-06 DIAGNOSIS — Z8673 Personal history of transient ischemic attack (TIA), and cerebral infarction without residual deficits: Secondary | ICD-10-CM | POA: Diagnosis not present

## 2021-09-06 DIAGNOSIS — E782 Mixed hyperlipidemia: Secondary | ICD-10-CM | POA: Diagnosis not present

## 2021-09-06 DIAGNOSIS — N3281 Overactive bladder: Secondary | ICD-10-CM | POA: Diagnosis not present

## 2021-09-06 DIAGNOSIS — I700 Atherosclerosis of aorta: Secondary | ICD-10-CM | POA: Diagnosis not present

## 2021-09-06 DIAGNOSIS — I2510 Atherosclerotic heart disease of native coronary artery without angina pectoris: Secondary | ICD-10-CM | POA: Diagnosis not present

## 2021-09-06 DIAGNOSIS — Z0000 Encounter for general adult medical examination without abnormal findings: Secondary | ICD-10-CM | POA: Diagnosis not present

## 2021-09-06 DIAGNOSIS — Z79899 Other long term (current) drug therapy: Secondary | ICD-10-CM | POA: Diagnosis not present

## 2021-09-06 DIAGNOSIS — E039 Hypothyroidism, unspecified: Secondary | ICD-10-CM | POA: Diagnosis not present

## 2021-09-06 DIAGNOSIS — K219 Gastro-esophageal reflux disease without esophagitis: Secondary | ICD-10-CM | POA: Diagnosis not present

## 2021-09-06 DIAGNOSIS — J439 Emphysema, unspecified: Secondary | ICD-10-CM | POA: Diagnosis not present

## 2021-09-09 DIAGNOSIS — L300 Nummular dermatitis: Secondary | ICD-10-CM | POA: Diagnosis not present

## 2021-09-09 DIAGNOSIS — Z85820 Personal history of malignant melanoma of skin: Secondary | ICD-10-CM | POA: Diagnosis not present

## 2021-09-12 DIAGNOSIS — M0609 Rheumatoid arthritis without rheumatoid factor, multiple sites: Secondary | ICD-10-CM | POA: Diagnosis not present

## 2021-09-12 DIAGNOSIS — M79643 Pain in unspecified hand: Secondary | ICD-10-CM | POA: Diagnosis not present

## 2021-09-12 DIAGNOSIS — M79671 Pain in right foot: Secondary | ICD-10-CM | POA: Diagnosis not present

## 2021-09-12 DIAGNOSIS — M154 Erosive (osteo)arthritis: Secondary | ICD-10-CM | POA: Diagnosis not present

## 2021-09-12 DIAGNOSIS — M79672 Pain in left foot: Secondary | ICD-10-CM | POA: Diagnosis not present

## 2021-09-12 DIAGNOSIS — M79641 Pain in right hand: Secondary | ICD-10-CM | POA: Diagnosis not present

## 2021-09-12 DIAGNOSIS — Z791 Long term (current) use of non-steroidal anti-inflammatories (NSAID): Secondary | ICD-10-CM | POA: Diagnosis not present

## 2021-09-12 DIAGNOSIS — M75100 Unspecified rotator cuff tear or rupture of unspecified shoulder, not specified as traumatic: Secondary | ICD-10-CM | POA: Diagnosis not present

## 2021-09-12 DIAGNOSIS — I7300 Raynaud's syndrome without gangrene: Secondary | ICD-10-CM | POA: Diagnosis not present

## 2021-09-12 DIAGNOSIS — M25512 Pain in left shoulder: Secondary | ICD-10-CM | POA: Diagnosis not present

## 2021-09-12 DIAGNOSIS — M79642 Pain in left hand: Secondary | ICD-10-CM | POA: Diagnosis not present

## 2021-10-02 DIAGNOSIS — Z79899 Other long term (current) drug therapy: Secondary | ICD-10-CM | POA: Diagnosis not present

## 2021-10-02 DIAGNOSIS — N3281 Overactive bladder: Secondary | ICD-10-CM | POA: Diagnosis not present

## 2021-10-02 DIAGNOSIS — N952 Postmenopausal atrophic vaginitis: Secondary | ICD-10-CM | POA: Diagnosis not present

## 2022-02-19 ENCOUNTER — Encounter (HOSPITAL_BASED_OUTPATIENT_CLINIC_OR_DEPARTMENT_OTHER): Payer: PPO | Attending: General Surgery | Admitting: General Surgery

## 2022-02-19 DIAGNOSIS — J449 Chronic obstructive pulmonary disease, unspecified: Secondary | ICD-10-CM | POA: Insufficient documentation

## 2022-02-19 DIAGNOSIS — I272 Pulmonary hypertension, unspecified: Secondary | ICD-10-CM | POA: Insufficient documentation

## 2022-02-19 DIAGNOSIS — L97822 Non-pressure chronic ulcer of other part of left lower leg with fat layer exposed: Secondary | ICD-10-CM | POA: Insufficient documentation

## 2022-02-19 NOTE — Progress Notes (Signed)
[name], [name] ([id number]) ?Visit Report for 02/19/2022 ?Abuse Risk Screen Details ?Patient Name: Date of Service: ?[name], [name] 02/19/2022 9:00 A M ?Medical Record Number: [id number] ?Patient Account Number: [id number] ?Date of Birth/Sex: Treating RN: ?Dec 17, 1945 (76 y.o. F) [name], [name] ?Primary Care [place]: [name]: ?Referring [name]: ?Treating [name]/Extender: [name] ?[name] ?Weeks in Treatment: 0 ?Abuse Risk Screen Items ?Answer ?ABUSE RISK SCREEN: ?Has anyone close to you tried to hurt or harm you recentlyo No ?Do you feel uncomfortable with anyone in your familyo No ?Has anyone forced you do things that you didnt want to doo No ?Electronic Signature(s) ?Signed: 02/19/2022 4:48:49 PM By: Baruch Gouty RN, BSN ?Entered By: Baruch Gouty on 02/19/2022 09:19:37 ?-------------------------------------------------------------------------------- ?Activities of Daily Living Details ?Patient Name: Date of Service: ?[name], [name] 02/19/2022 9:00 A M ?Medical Record Number: [id number] ?Patient Account Number: [id number] ?Date of Birth/Sex: Treating RN: ?1946-05-24 (76 y.o. F) [name], [name] ?Primary Care Lashonna Rieke: [name]: ?Referring Cozy Veale: ?Treating Yoshika Vensel/Extender: [name] ?[name] ?Weeks in Treatment: 0 ?Activities of Daily Living Items ?Answer ?Activities of Daily Living (Please select one for each item) ?[place] ?T Medications ?ake Completely Able ?Use T elephone Completely Able ?Care for Appearance Completely Able ?Use T oilet Completely Able ?Bath / Shower Completely Able ?Dress Self Completely Able ?Feed Self Completely Able ?Walk Completely Able ?Get In / Out Bed Completely Able ?Housework Completely Able ?Prepare Meals Completely Able ?Handle Money Completely Able ?Shop for Self Completely Able ?Electronic Signature(s) ?Signed: 02/19/2022 4:48:49 PM By: Baruch Gouty RN, BSN ?Entered By: Baruch Gouty on 02/19/2022 09:21:15 ?-------------------------------------------------------------------------------- ?Education Screening Details ?Patient Name: ?Date of Service: ?[name], [name] 02/19/2022 9:00 A M ?Medical Record Number: [id number] ?Patient Account Number: [id number] ?Date of Birth/Sex: ?Treating RN: ?11-02-1945 (76 y.o. F) [name], [name] ?Primary Care Rendon Howell: [name] ?Other Long: ?Referring Kami Kube: ?Treating Zabian Swayne/Extender: [name] ?[name] ?Weeks in Treatment: 0 ?Primary Learner Assessed: Patient ?Learning Preferences/Education Level/Primary Language ?Learning Preference: Explanation, Demonstration, Printed Material ?Highest Education Level: College or Above ?Preferred Language: English ?Cognitive Barrier ?Language Barrier: No ?Translator Needed: No ?Memory Deficit: No ?Emotional Barrier: No ?Cultural/Religious Beliefs Affecting Medical Care: No ?Physical Barrier ?Impaired Vision: Yes Glasses ?Impaired Hearing: No ?Decreased Hand dexterity: No ?Knowledge/Comprehension ?Knowledge Level: High ?Comprehension Level: High ?Ability to understand written instructions: High ?Ability to understand verbal instructions: High ?Motivation ?Anxiety Level: Calm ?Cooperation: Cooperative ?Education Importance: Acknowledges Need ?Interest in Health Problems: Asks Questions ?Perception: Coherent ?Willingness to Engage in Self-Management High ?Activities: ?Readiness to Engage in Self-Management High ?Activities: ?Electronic Signature(s) ?Signed: 02/19/2022 4:48:49 PM By: Baruch Gouty RN, BSN ?Entered By: Baruch Gouty on 02/19/2022 09:21:53 ?-------------------------------------------------------------------------------- ?Fall Risk Assessment Details ?Patient Name: ?Date of Service: ?[name], [name] 02/19/2022 9:00 A M ?Medical Record Number: [id number] ?Patient Account Number: [id number] ?Date of Birth/Sex: ?Treating RN: ?January 10, 1946 (76 y.o. F) [name], [name] ?Primary Care Tiffony Kite:  [name] ?Other Long: ?Referring Lailany Enoch: ?Treating Sindia Kowalczyk/Extender: [name] ?[name] ?Weeks in Treatment: 0 ?Fall Risk Assessment Items ?Have you had 2 or more falls in the last 12 monthso 0 No ?Have you had any fall that resulted in injury in the last 12 monthso 0 No ?FALLS RISK SCREEN ?History of falling - immediate or within 3 months 0 No ?Secondary diagnosis (Do you have 2 or more medical diagnoseso) 0 No ?Ambulatory aid ?None/bed rest/wheelchair/nurse 0 Yes ?Crutches/cane/walker 0 No ?Furniture 0 No ?Intravenous therapy Access/Saline/Heparin Lock 0 No ?Gait/Transferring ?Normal/ bed rest/ wheelchair  0 Yes ?Weak (short steps with or without shuffle, stooped but able to lift head while walking, may seek 0 No ?support from furniture) ?Impaired (short steps with shuffle, may have difficulty arising from chair, head down, impaired 0 No ?balance) ?Mental Status ?Oriented to own ability 0 Yes ?Electronic Signature(s) ?Signed: 02/19/2022 4:48:49 PM By: Baruch Gouty RN, BSN ?Entered By: Baruch Gouty on 02/19/2022 09:22:12 ?-------------------------------------------------------------------------------- ?Foot Assessment Details ?Patient Name: ?Date of Service: ?[name], [name] 02/19/2022 9:00 A M ?Medical Record Number: [id number] ?Patient Account Number: [id number] ?Date of Birth/Sex: ?Treating RN: ?10/25/1946 (76 y.o. F) [name], [name] ?Primary Care Debie Ashline: [name] ?Other Long: ?Referring Dellar Traber: ?Treating Louise Rawson/Extender: [name] ?[name] ?Weeks in Treatment: 0 ?Foot Assessment Items ?Site Locations ?+ = Sensation present, - = Sensation absent, C = Callus, U = Ulcer ?R = Redness, W = Warmth, M = Maceration, PU = Pre-ulcerative lesion ?F = Fissure, S = Swelling, D = Dryness ?Assessment ?Right: Left: ?Other Deformity: No No ?Prior Foot Ulcer: No No ?Prior Amputation: No No ?Charcot Joint: No No ?Ambulatory Status: Ambulatory Without Help ?Gait:  Steady ?Electronic Signature(s) ?Signed: 02/19/2022 4:48:49 PM By: Baruch Gouty RN, BSN ?Entered By: Baruch Gouty on 02/19/2022 09:23:10 ?-------------------------------------------------------------------------------- ?Nutrition Risk Screening Details ?Patient Name: ?Date of Service: ?Ohlson, [name] 02/19/2022 9:00 A M ?Medical Record Number: [id number] ?Patient Account Number: [id number] ?Date of Birth/Sex: ?Treating RN: ?[birth year] (76 y.o. F) [name], [name] ?Primary Care Bryanne Riquelme: [name] ?Other Long: ?Referring Sharica Roedel: ?Treating Alix Lahmann/Extender: [name] ?[name] ?Weeks in Treatment: 0 ?Height (in): 61 ?Weight (lbs): 120 ?Body Mass Index (BMI): 22.7 ?Nutrition Risk Screening Items ?Score Screening ?NUTRITION RISK SCREEN: ?I have an illness or condition that made me change the kind and/or amount of food I eat 0 No ?I eat fewer than two meals per day 0 No ?I eat few fruits and vegetables, or milk products 0 No ?I have three or more drinks of beer, liquor or wine almost every day 0 No ?I have tooth or mouth problems that make it hard for me to eat 0 No ?I don't always have enough money to buy the food I need 0 No ?I eat alone most of the time 0 No ?I take three or more different prescribed or over-the-counter drugs a day 1 Yes ?Without wanting to, I have lost or gained 10 pounds in the last six months 0 No ?I am not always physically able to shop, cook and/or feed myself 0 No ?Nutrition Protocols ?Good Risk Protocol 0 No interventions needed ?Moderate Risk Protocol ?High Risk Proctocol ?Risk Level: Good Risk ?Score: 1 ?Electronic Signature(s) ?Signed: 02/19/2022 4:48:49 PM By: Baruch Gouty RN, BSN ?Entered By: Baruch Gouty on 02/19/2022 09:22:38 ?

## 2022-02-24 NOTE — Progress Notes (Signed)
JALEXA, PIFER ([id number]) ?Visit Report for 02/19/2022 ?Chief Complaint Document Details ?Patient Name: Date of Service: ?Guinn, [name] 02/19/2022 9:00 A M ?Medical Record Number: [id number] ?Patient Account Number: [id number] ?Date of Birth/Sex: Treating RN: ?Apr 06, 1946 (76 y.o. F) ?Primary Care Provider: Teressa Lower Other Clinician: ?Referring Provider: ?Treating Provider/Extender: Fredirick Maudlin ?Teressa Lower ?Weeks in Treatment: 0 ?Information Obtained from: Patient ?Chief Complaint ?Patient seen for complaints of Non-Healing Wound. ?Electronic Signature(s) ?Signed: 02/19/2022 9:57:36 AM By: Fredirick Maudlin MD FACS ?Entered By: Fredirick Maudlin on 02/19/2022 09:57:36 ?-------------------------------------------------------------------------------- ?Debridement Details ?Patient Name: Date of Service: ?Fjelstad, [name] 02/19/2022 9:00 A M ?Medical Record Number: [id number] ?Patient Account Number: [id number] ?Date of Birth/Sex: Treating RN: ?28-Jun-1946 (76 y.o. [name] ?Primary Care Provider: Teressa Lower Other Clinician: ?Referring Provider: ?Treating Provider/Extender: Fredirick Maudlin ?Teressa Lower ?Weeks in Treatment: 0 ?Debridement Performed for Assessment: Wound #1 Left,Anterior Lower Leg ?Performed By: Physician Fredirick Maudlin, MD ?Debridement Type: Debridement ?Level of Consciousness (Pre-procedure): Awake and Alert ?Pre-procedure Verification/Time Out Yes - 09:46 ?Taken: ?Start Time: 09:46 ?Pain Control: Lidocaine 4% T opical Solution ?T Area Debrided (L x W): ?otal 1.7 (cm) x 1.1 (cm) = 1.87 (cm?) ?Tissue and other material debrided: Viable, Non-Viable, Slough, Subcutaneous, Slough ?Level: Skin/Subcutaneous Tissue ?Debridement Description: Excisional ?Instrument: Curette ?Bleeding: Minimum ?Hemostasis Achieved: Pressure ?Procedural Pain: 0 ?Post Procedural Pain: 0 ?Response to Treatment: Procedure was tolerated well ?Level of Consciousness (Post- Awake and Alert ?procedure): ?Post  Debridement Measurements of Total Wound ?Length: (cm) 1.7 ?Width: (cm) 1.1 ?Depth: (cm) 0.1 ?Volume: (cm?) 0.147 ?Character of Wound/Ulcer Post Debridement: Improved ?Post Procedure Diagnosis ?Same as Pre-procedure ?Electronic Signature(s) ?Signed: 02/19/2022 5:05:56 PM By: Fredirick Maudlin MD FACS ?Signed: 02/24/2022 5:16:31 PM By: Adline Peals ?Entered By: Adline Peals on 02/19/2022 09:46:56 ?-------------------------------------------------------------------------------- ?Debridement Details ?Patient Name: ?Date of Service: ?Fry, [name] 02/19/2022 9:00 A M ?Medical Record Number: [id number] ?Patient Account Number: [id number] ?Date of Birth/Sex: ?Treating RN: ?[birth year] (76 y.o. [name] ?Primary Care Provider: Teressa Lower ?Other Clinician: ?Referring Provider: ?Treating Provider/Extender: Fredirick Maudlin ?Teressa Lower ?Weeks in Treatment: 0 ?Debridement Performed for Assessment: Wound #2 Left,Distal,Anterior Lower Leg ?Performed By: Physician Fredirick Maudlin, MD ?Debridement Type: Debridement ?Level of Consciousness (Pre-procedure): Awake and Alert ?Pre-procedure Verification/Time Out Yes - 09:46 ?Taken: ?Start Time: 09:46 ?Pain Control: Lidocaine 4% T opical Solution ?T Area Debrided (L x W): ?otal 1 (cm) x 1.6 (cm) = 1.6 (cm?) ?Tissue and other material debrided: Viable, Non-Viable, Slough, Subcutaneous, Slough ?Level: Skin/Subcutaneous Tissue ?Debridement Description: Excisional ?Instrument: Curette ?Bleeding: Minimum ?Hemostasis Achieved: Pressure ?Procedural Pain: 0 ?Post Procedural Pain: 0 ?Response to Treatment: Procedure was tolerated well ?Level of Consciousness (Post- Awake and Alert ?procedure): ?Post Debridement Measurements of Total Wound ?Length: (cm) 1 ?Width: (cm) 1.6 ?Depth: (cm) 0.1 ?Volume: (cm?) 0.126 ?Character of Wound/Ulcer Post Debridement: Improved ?Post Procedure Diagnosis ?Same as Pre-procedure ?Electronic Signature(s) ?Signed: 02/19/2022 5:05:56 PM By:  Fredirick Maudlin MD FACS ?Signed: 02/24/2022 5:16:31 PM By: Adline Peals ?Entered By: Adline Peals on 02/19/2022 09:47:23 ?-------------------------------------------------------------------------------- ?Debridement Details ?Patient Name: ?Date of Service: ?Head, [name] 02/19/2022 9:00 A M ?Medical Record Number: [id number] ?Patient Account Number: [id number] ?Date of Birth/Sex: ?Treating RN: ?Mar 13, 1946 (76 y.o. [name] ?Primary Care Provider: Teressa Lower ?Other Clinician: ?Referring Provider: ?Treating Provider/Extender: Fredirick Maudlin ?Teressa Lower ?Weeks in Treatment: 0 ?Debridement Performed for Assessment: Wound #3 Left,Medial Lower Leg ?Performed By: Physician Fredirick Maudlin, MD ?Debridement Type: Debridement ?Level of Consciousness (Pre-procedure): Awake and Alert ?Pre-procedure Verification/Time Out Yes -  09:46 ?Taken: ?Start Time: 09:46 ?Pain Control: Lidocaine 4% T opical Solution ?T Area Debrided (L x W): ?otal 1 (cm) x 1 (cm) = 1 (cm?) ?Tissue and other material debrided: Viable, Non-Viable, Slough, Subcutaneous, Slough ?Level: Skin/Subcutaneous Tissue ?Debridement Description: Excisional ?Instrument: Curette ?Bleeding: Minimum ?Hemostasis Achieved: Pressure ?Procedural Pain: 0 ?Post Procedural Pain: 0 ?Response to Treatment: Procedure was tolerated well ?Level of Consciousness (Post- Awake and Alert ?procedure): ?Post Debridement Measurements of Total Wound ?Length: (cm) 1 ?Width: (cm) 1 ?Depth: (cm) 0.1 ?Volume: (cm?) 0.079 ?Character of Wound/Ulcer Post Debridement: Improved ?Post Procedure Diagnosis ?Same as Pre-procedure ?Electronic Signature(s) ?Signed: 02/19/2022 5:05:56 PM By: Fredirick Maudlin MD FACS ?Signed: 02/24/2022 5:16:31 PM By: Adline Peals ?Entered By: Adline Peals on 02/19/2022 09:47:52 ?-------------------------------------------------------------------------------- ?HPI Details ?Patient Name: Date of Service: ?[place]os, [name] 02/19/2022 9:00 A  M ?Medical Record Number: [id number] ?Patient Account Number: [id number] ?Date of Birth/Sex: Treating RN: ?[birth year] (76 y.o. F) ?Primary Care Provider: Teressa Lower Other Clinician: ?Referring Provider: ?Treating Provider/Extender: Fredirick Maudlin ?Teressa Lower ?Weeks in Treatment: 0 ?History of Present Illness ?HPI Description: ADMISSION ?02/19/2022 ?This is a 76 year old woman with minimal pertinent medical history. She does have COPD and pulmonary hypertension, but is not diabetic and is not a smoker. ?About 6 months ago, she and her husband got a new beagle puppy. The puppy scratched her leg and the scratches ultimately deteriorated into ulcerations. ?Apparently she sought care with a dermatologist who recommended applying a one-to-one mixture of peroxide and water to the wounds followed by a thick ?layer of Vaseline. She continue this for some time but then saw her primary care provider who told her to discontinue the peroxide. She has not been on any ?antibiotics for the scratches. Today, there are 3 separate wounds on her anterior tibial surface on the left. They are tender and her leg has localized swelling. ?There is yellow slough buildup in each of the wound bases. ABI in clinic today was normal at 1.16. 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 ?Electronic Signature(s) ?Signed: 02/19/2022 10:00:01 AM By: Fredirick Maudlin MD FACS ?Entered By: Fredirick Maudlin on 02/19/2022 10:00:01 ?-------------------------------------------------------------------------------- ?Physical Exam Details ?Patient Name: Date of Service: ?Causby, [name] 02/19/2022 9:00 A M ?Medical Record Number: [id number] ?Patient Account Number: [id number] ?Date of Birth/Sex: Treating RN: ?Oct 10, 1946 (76 y.o. F) ?Primary Care Provider: Teressa Lower Other Clinician: ?Referring Provider: ?Treating Provider/Extender: Fredirick Maudlin ?Teressa Lower ?Weeks in Treatment: 0 ?Constitutional ?. . . . No acute  distress. ?Cardiovascular ?.. ?Notes ?02/19/2022: On the anterior tibial surface of her left lower extremity, there are 3 small ulcers. There is yellow slough on the surface of all of them. The periwound ?skin is a bit swollen an

## 2022-02-24 NOTE — Progress Notes (Signed)
PHOENYX, MELKA ([id number]) ?Visit Report for 02/19/2022 ?Allergy List Details ?Patient Name: Date of Service: ?Brashear, [name] 02/19/2022 9:00 A M ?Medical Record Number: [id number] ?Patient Account Number: [id number] ?Date of Birth/Sex: Treating RN: ?July 14, 1946 (76 y.o. F) [name], [name] ?Primary Care Roquel Burgin: Teressa Lower Other Clinician: ?Referring Lysbeth Dicola: ?Treating Lendon George/Extender: Fredirick Maudlin ?Teressa Lower ?Weeks in Treatment: 0 ?Allergies ?Active Allergies ?Sulfa (Sulfonamide Antibiotics) ?Reaction: hives ?adhesive ?Reaction: blisters ?amoxicillin ?Reaction: unknown ?ciprofloxacin ?Reaction: unknown ?Statins-Hmg-Coa Reductase Inhibitors ?Reaction: leg cramps ?Allergy Notes ?Electronic Signature(s) ?Signed: 02/19/2022 4:48:49 PM By: Baruch Gouty RN, BSN ?Entered By: Baruch Gouty on 02/19/2022 09:09:52 ?-------------------------------------------------------------------------------- ?Arrival Information Details ?Patient Name: Date of Service: ?Madry, [name] 02/19/2022 9:00 A M ?Medical Record Number: [id number] ?Patient Account Number: [id number] ?Date of Birth/Sex: Treating RN: ?May 08, 1946 (76 y.o. F) ?Primary Care Aaiden Depoy: Teressa Lower Other Clinician: ?Referring Algie Cales: ?Treating Miliyah Luper/Extender: Fredirick Maudlin ?Teressa Lower ?Weeks in Treatment: 0 ?Visit Information ?Patient Arrived: Ambulatory ?Arrival Time: 08:49 ?Accompanied By: husband ?Transfer Assistance: None ?Patient Identification Verified: Yes ?Secondary Verification Process Completed: Yes ?Patient Requires Transmission-Based Precautions: No ?Patient Has Alerts: No ?Electronic Signature(s) ?Signed: 02/19/2022 1:00:19 PM By: Sandre Kitty ?Signed: 02/19/2022 1:00:19 PM By: Sandre Kitty ?Entered By: Sandre Kitty on 02/19/2022 08:51:00 ?-------------------------------------------------------------------------------- ?Clinic Level of Care Assessment Details ?Patient Name: Date of Service: ?Schneider, [name] 02/19/2022 9:00 A  M ?Medical Record Number: [id number] ?Patient Account Number: [id number] ?Date of Birth/Sex: Treating RN: ?Jan 26, 1946 (76 y.o. [name] ?Primary Care Keidra Withers: Teressa Lower Other Clinician: ?Referring Arwyn Besaw: ?Treating Teondra Newburg/Extender: Fredirick Maudlin ?Teressa Lower ?Weeks in Treatment: 0 ?Clinic Level of Care Assessment Items ?TOOL 1 Quantity Score ?X- 1 0 ?Use when EandM and Procedure is performed on INITIAL visit ?ASSESSMENTS - Nursing Assessment / Reassessment ?X- 1 20 ?General Physical Exam (combine w/ comprehensive assessment (listed just below) when performed on new pt. evals) ?X- 1 25 ?Comprehensive Assessment (HX, ROS, Risk Assessments, Wounds Hx, etc.) ?ASSESSMENTS - Wound and Skin Assessment / Reassessment ?'[]'$  - 0 ?Dermatologic / Skin Assessment (not related to wound area) ?ASSESSMENTS - Ostomy and/or Continence Assessment and Care ?'[]'$  - 0 ?Incontinence Assessment and Management ?'[]'$  - 0 ?Ostomy Care Assessment and Management (repouching, etc.) ?PROCESS - Coordination of Care ?X - Simple Patient / Family Education for ongoing care 1 15 ?'[]'$  - 0 ?Complex (extensive) Patient / Family Education for ongoing care ?X- 1 10 ?Staff obtains Consents, Records, T Results / Process Orders ?est ?'[]'$  - 0 ?Staff telephones HHA, Nursing Homes / Clarify orders / etc ?'[]'$  - 0 ?Routine Transfer to another Facility (non-emergent condition) ?'[]'$  - 0 ?Routine Hospital Admission (non-emergent condition) ?X- 1 15 ?New Admissions / Biomedical engineer / Ordering NPWT Apligraf, etc. ?, ?'[]'$  - 0 ?Emergency Hospital Admission (emergent condition) ?PROCESS - Special Needs ?'[]'$  - 0 ?Pediatric / Minor Patient Management ?'[]'$  - 0 ?Isolation Patient Management ?'[]'$  - 0 ?Hearing / Language / Visual special needs ?'[]'$  - 0 ?Assessment of Community assistance (transportation, D/C planning, etc.) ?'[]'$  - 0 ?Additional assistance / Altered mentation ?'[]'$  - 0 ?Support Surface(s) Assessment (bed, cushion, seat, etc.) ?INTERVENTIONS -  Miscellaneous ?'[]'$  - 0 ?External ear exam ?'[]'$  - 0 ?Patient Transfer (multiple staff / Civil Service fast streamer / Similar devices) ?'[]'$  - 0 ?Simple Staple / Suture removal (25 or less) ?'[]'$  - 0 ?Complex Staple / Suture removal (26 or more) ?'[]'$  - 0 ?Hypo/Hyperglycemic Management (do not check if billed separately) ?X- 1 15 ?Ankle / Brachial Index (ABI) - do not check if billed separately ?  Has the patient been seen at the hospital within the last three years: Yes ?Total Score: 100 ?Level Of Care: New/Established - Level 3 ?Electronic Signature(s) ?Signed: 02/24/2022 5:16:31 PM By: Adline Peals ?Entered By: Adline Peals on 02/19/2022 10:03:26 ?-------------------------------------------------------------------------------- ?Compression Therapy Details ?Patient Name: ?Date of Service: ?Gangwer, [name] 02/19/2022 9:00 A M ?Medical Record Number: [id number] ?Patient Account Number: [id number] ?Date of Birth/Sex: ?Treating RN: ?05-13-1946 (76 y.o. [name] ?Primary Care Delia Slatten: Teressa Lower ?Other Clinician: ?Referring Latravion Graves: ?Treating Israa Caban/Extender: Fredirick Maudlin ?Teressa Lower ?Weeks in Treatment: 0 ?Compression Therapy Performed for Wound Assessment: Wound #2 Left,Distal,Anterior Lower Leg ?Performed By: Clinician Adline Peals, RN ?Compression Type: Three Layer ?Post Procedure Diagnosis ?Same as Pre-procedure ?Electronic Signature(s) ?Signed: 02/24/2022 5:16:31 PM By: Adline Peals ?Entered By: Adline Peals on 02/19/2022 09:49:22 ?-------------------------------------------------------------------------------- ?Encounter Discharge Information Details ?Patient Name: ?Date of Service: ?Florence, [name] 02/19/2022 9:00 A M ?Medical Record Number: [id number] ?Patient Account Number: [id number] ?Date of Birth/Sex: ?Treating RN: ?10-19-1946 (76 y.o. [name] ?Primary Care Adel Burch: Teressa Lower ?Other Clinician: ?Referring Kyden Potash: ?Treating Krishawna Stiefel/Extender: Fredirick Maudlin ?Teressa Lower ?Weeks in Treatment: 0 ?Encounter Discharge Information Items Post Procedure Vitals ?Discharge Condition: Stable ?Temperature (F): 98.1 ?Ambulatory Status: Ambulatory ?Pulse (bpm): 83 ?Discharge Destination: Home ?Respiratory Rate (breaths/min): 18 ?Transportation: Private Auto ?Blood Pressure (mmHg): 141/70 ?Accompanied By: spouse ?Schedule Follow-up Appointment: Yes ?Clinical Summary of Care: Patient Declined ?Electronic Signature(s) ?Signed: 02/24/2022 5:16:31 PM By: Adline Peals ?Entered By: Adline Peals on 02/19/2022 10:05:34 ?-------------------------------------------------------------------------------- ?Lower Extremity Assessment Details ?Patient Name: ?Date of Service: ?Hemminger, [name] 02/19/2022 9:00 A M ?Medical Record Number: [id number] ?Patient Account Number: [id number] ?Date of Birth/Sex: ?Treating RN: ?[birth year] (76 y.o. F) ?Primary Care Bartlomiej Jenkinson: Teressa Lower ?Other Clinician: ?Referring Brei Pociask: ?Treating Cynda Soule/Extender: Fredirick Maudlin ?Teressa Lower ?Weeks in Treatment: 0 ?Edema Assessment ?Assessed: [Left: Yes] [Right: No] ?E[Left: dema] [Right: :] ?Calf ?Left: Right: ?Point of Measurement: 33 cm From Medial Instep 31.2 cm ?Ankle ?Left: Right: ?Point of Measurement: 9 cm From Medial Instep 19 cm ?Vascular Assessment ?Pulses: ?Dorsalis Pedis ?Palpable: [Left:Yes] ?Blood Pressure: ?Brachial: [Left:141] ?Dorsalis Pedis: 164 ?Ankle: ?[Left:Posterior Tibial: 162 1.16] ?Electronic Signature(s) ?Signed: 02/19/2022 4:48:49 PM By: Baruch Gouty RN, BSN ?Entered By: Baruch Gouty on 02/19/2022 09:36:02 ?-------------------------------------------------------------------------------- ?Multi Wound Chart Details ?Patient Name: ?Date of Service: ?Victorio, [name] 02/19/2022 9:00 A M ?Medical Record Number: [id number] ?Patient Account Number: [id number] ?Date of Birth/Sex: ?Treating RN: ?02-25-1946 (76 y.o. F) ?Primary Care Zoria Rawlinson: Teressa Lower ?Other Clinician: ?Referring  Haislee Corso: ?Treating Shatasia Cutshaw/Extender: Fredirick Maudlin ?Teressa Lower ?Weeks in Treatment: 0 ?Vital Signs ?Height(in): 61 ?Pulse(bpm): 83 ?Weight(lbs): 120 ?Blood Pressure(mmHg): 141/70 ?Body Mass Index(BMI): 22.7 ?Temper

## 2022-02-25 ENCOUNTER — Encounter (HOSPITAL_BASED_OUTPATIENT_CLINIC_OR_DEPARTMENT_OTHER): Payer: PPO | Attending: General Surgery | Admitting: General Surgery

## 2022-02-25 DIAGNOSIS — X58XXXA Exposure to other specified factors, initial encounter: Secondary | ICD-10-CM | POA: Insufficient documentation

## 2022-02-25 DIAGNOSIS — S80812A Abrasion, left lower leg, initial encounter: Secondary | ICD-10-CM | POA: Diagnosis not present

## 2022-02-25 DIAGNOSIS — Z87891 Personal history of nicotine dependence: Secondary | ICD-10-CM | POA: Insufficient documentation

## 2022-02-25 DIAGNOSIS — Y93K9 Activity, other involving animal care: Secondary | ICD-10-CM | POA: Diagnosis not present

## 2022-02-25 DIAGNOSIS — I272 Pulmonary hypertension, unspecified: Secondary | ICD-10-CM | POA: Insufficient documentation

## 2022-02-25 DIAGNOSIS — J449 Chronic obstructive pulmonary disease, unspecified: Secondary | ICD-10-CM | POA: Insufficient documentation

## 2022-02-25 DIAGNOSIS — L97222 Non-pressure chronic ulcer of left calf with fat layer exposed: Secondary | ICD-10-CM | POA: Diagnosis present

## 2022-02-26 NOTE — Progress Notes (Signed)
LUVADA, SALAMONE ([id number]) ?Visit Report for 02/25/2022 ?Arrival Information Details ?Patient Name: Date of Service: ?[place]en, [name] 02/25/2022 1:15 PM ?Medical Record Number: [id number] ?Patient Account Number: [id number] ?Date of Birth/Sex: Treating RN: ?11-May-1946 (76 y.o. F) [name], [name] ?Primary Care Pride Gonzales: Teressa Lower Other Clinician: ?Referring Shamiah Kahler: ?Treating Aedyn Mckeon/Extender: Fredirick Maudlin ?Teressa Lower ?Weeks in Treatment: 0 ?Visit Information History Since Last Visit ?Added or deleted any medications: No ?Patient Arrived: Ambulatory ?Any new allergies or adverse reactions: No ?Arrival Time: 13:06 ?Had a fall or experienced change in No ?Accompanied By: husband ?activities of daily living that may affect ?Transfer Assistance: None ?risk of falls: ?Patient Identification Verified: Yes ?Signs or symptoms of abuse/neglect since last visito No ?Secondary Verification Process Completed: Yes ?Hospitalized since last visit: No ?Patient Requires Transmission-Based Precautions: No ?Implantable device outside of the clinic excluding No ?Patient Has Alerts: No ?cellular tissue based products placed in the center ?since last visit: ?Has Dressing in Place as Prescribed: Yes ?Pain Present Now: Yes ?Electronic Signature(s) ?Signed: 02/26/2022 8:30:04 AM By: Sandre Kitty ?Entered By: Sandre Kitty on 02/25/2022 13:07:19 ?-------------------------------------------------------------------------------- ?Compression Therapy Details ?Patient Name: Date of Service: ?[place], [name] 02/25/2022 1:15 PM ?Medical Record Number: [id number] ?Patient Account Number: [id number] ?Date of Birth/Sex: Treating RN: ?February 06, 1946 (76 y.o. [name] ?Primary Care Jasara Corrigan: Teressa Lower Other Clinician: ?Referring Prestyn Stanco: ?Treating Joel Cowin/Extender: Fredirick Maudlin ?Teressa Lower ?Weeks in Treatment: 0 ?Compression Therapy Performed for Wound Assessment: Wound #2 Left,Distal,Anterior Lower Leg ?Performed By:  Clinician Adline Peals, RN ?Compression Type: Three Layer ?Post Procedure Diagnosis ?Same as Pre-procedure ?Electronic Signature(s) ?Signed: 02/25/2022 5:03:59 PM By: Adline Peals ?Entered By: Adline Peals on 02/25/2022 16:21:50 ?-------------------------------------------------------------------------------- ?Encounter Discharge Information Details ?Patient Name: ?Date of Service: ?[place], [name] 02/25/2022 1:15 PM ?Medical Record Number: [id number] ?Patient Account Number: [id number] ?Date of Birth/Sex: ?Treating RN: ?1946-05-15 (76 y.o. [name] ?Primary Care Jalien Weakland: Teressa Lower ?Other Clinician: ?Referring Asim Gersten: ?Treating Syndey Jaskolski/Extender: Fredirick Maudlin ?Teressa Lower ?Weeks in Treatment: 0 ?Encounter Discharge Information Items Post Procedure Vitals ?Discharge Condition: Stable ?Temperature (F): 98.6 ?Ambulatory Status: Ambulatory ?Pulse (bpm): 111 ?Discharge Destination: Home ?Respiratory Rate (breaths/min): 18 ?Transportation: Private Auto ?Blood Pressure (mmHg): 138/70 ?Accompanied By: self ?Schedule Follow-up Appointment: Yes ?Clinical Summary of Care: Patient Declined ?Electronic Signature(s) ?Signed: 02/25/2022 5:03:59 PM By: Adline Peals ?Entered By: Adline Peals on 02/25/2022 14:39:31 ?-------------------------------------------------------------------------------- ?Lower Extremity Assessment Details ?Patient Name: ?Date of Service: ?[place], [name] 02/25/2022 1:15 PM ?Medical Record Number: [id number] ?Patient Account Number: [id number] ?Date of Birth/Sex: ?Treating RN: ?[birth year] (76 y.o. [name] ?Primary Care Kemuel Buchmann: Teressa Lower ?Other Clinician: ?Referring Susy Placzek: ?Treating Urias Sheek/Extender: Fredirick Maudlin ?Teressa Lower ?Weeks in Treatment: 0 ?Edema Assessment ?Assessed: [Left: No] [Right: No] ?E[Left: dema] [Right: :] ?Calf ?Left: Right: ?Point of Measurement: 33 cm From Medial Instep 31.2 cm ?Ankle ?Left: Right: ?Point of  Measurement: 9 cm From Medial Instep 19.2 cm ?Vascular Assessment ?Pulses: ?Dorsalis Pedis ?Palpable: [Left:Yes] ?Electronic Signature(s) ?Signed: 02/25/2022 5:03:59 PM By: Adline Peals ?Entered By: Adline Peals on 02/25/2022 14:03:58 ?-------------------------------------------------------------------------------- ?Multi Wound Chart Details ?Patient Name: ?Date of Service: ?Vincent, [name] 02/25/2022 1:15 PM ?Medical Record Number: [id number] ?Patient Account Number: [id number] ?Date of Birth/Sex: ?Treating RN: ?1946/06/16 (76 y.o. 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 ?Primary Etiology: ?Cataracts, Anemia, Chronic Cataracts, Anemia, Chronic Cataracts, Anemia, Chronic ?Comorbid History: ?Obstructive Pulmonary Disease Obstructive Pulmonary Disease Obstructive Pulmonary Disease ?(COPD), Coronary Artery Disease, (COPD), Coronary Artery Disease, (COPD), Coronary Artery Disease, ?Peripheral Venous Disease, Peripheral Venous Disease, Peripheral Venous Disease, ?Raynauds, Rheumatoid Arthritis, Raynauds, Rheumatoid Arthritis, Raynauds, Rheumatoid Arthritis, ?Osteoarthritis Osteoarthritis Osteoarthritis ?08/06/2021 08/06/2021 08/06/2021 ?Date Acquired: ?0 0 0 ?Weeks of Treatment: ?Open Open Open ?Wound Status: ?No No No ?Wound Recurrence: ?1.9x1.2x0.1 1.5x1.6x0.1 1x1.4x0.1 ?Measurements L x W x D (cm) ?1.791 1.885 1.1 ?A (cm?) : ?rea ?0.179 0.188 0.11 ?Volume (cm?) : ?-21.90% -50.00% -40.10% ?% Reduction in  A rea: ?-21.80% -49.20% -39.20% ?% Reduction in Volume: ?Full Thickness Without Exposed Full Thickness Without Exposed Full Thickness Without Exposed ?Classification: ?Support Structures Support Structures Support Structures ?Medium Medium Medium ?Exudate A mount: ?Serosanguineous Serosanguineous Serosanguineous ?Exudate Type: ?red, brown red, brown red, brown ?Exudate Color: ?Flat and Intact Flat and Intact Flat and Intact ?Wound Margin: ?Small (1-33%) Small (1-33%) Small (1-33%) ?Granulation A mount: ?Pink Pink Pink ?Granulation Quality: ?Large (67-100%) Large (67-100%) Large (67-100%) ?Necrotic A mount: ?Fat Layer (Subcutaneous Tissue): Yes Fat Layer (Subcutaneous Tissue): Yes Fat Layer (Subcutaneous Tissue): Yes ?Exposed Structures: ?Fascia: No ?Fascia: No ?Fascia: No ?Tendon: No ?Tendon: No ?Tendon: No ?Muscle: No ?Muscle: No ?Muscle: No ?Joint: No ?Joint: No ?Joint: No ?Bone: No ?Bone: No ?Bone: No ?None Small (1-33%) None ?Epithelialization: ?Debridement - Excisional Debridement - Excisional Debridement - Excisional ?Debridement: ?Pre-procedure Verification/Time Out 14:12 14:12 14:12 ?Taken: ?Other Other Other ?Pain Control: ?Subcutaneous, [place] Subcutaneous, [employer], [place] ?Tissue Debrided: ?Skin/Subcutaneous Tissue Skin/Subcutaneous Tissue Skin/Subcutaneous Tissue ?Level: ?2.28 2.4 1.4 ?Debridement A (sq cm): ?rea ?Curette Curette Curette ?Instrument: ?Minimum Minimum Minimum ?Bleeding: ?Pressure Pressure Pressure ?Hemostasis A chieved: ?'7 7 7 '$ ?Procedural Pain: ?'2 2 2 '$ ?Post Procedural Pain: ?Procedure was tolerated well Procedure was tolerated well Procedure was tolerated well ?Debridement Treatment Response: ?1.9x1.2x0.1 1.5x1.6x0.1 1x1.4x0.1 ?Post Debridement Measurements L x ?W x D (cm) ?0.179 0.188 0.11 ?Post Debridement Volume: (cm?) ?Debridement Debridement Debridement ?Procedures Performed: ?Treatment Notes ?Electronic Signature(s) ?Signed: 02/25/2022 2:16:05 PM By: Fredirick Maudlin MD  FACS ?Signed: 02/26/2022 5:38:18 PM By: Baruch Gouty RN, BSN ?Entered By: Fredirick Maudlin on 02/25/2022 14:16:05 ?-------------------------------------------------------------------------------- ?Multi-Disciplinary C

## 2022-02-26 NOTE — Progress Notes (Signed)
HAZELYNN, MCKENNY ([id number]) ?Visit Report for 02/25/2022 ?Chief Complaint Document Details ?Patient Name: Date of Service: ?[place], [name] 02/25/2022 1:15 PM ?Medical Record Number: [id number] ?Patient Account Number: [id number] ?Date of Birth/Sex: Treating RN: ?10/19/1946 (76 y.o. F) [name], [name] ?Primary Care Provider: Teressa Lower Other Clinician: ?Referring Provider: ?Treating Provider/Extender: Fredirick Maudlin ?Teressa Lower ?Weeks in Treatment: 0 ?Information Obtained from: Patient ?Chief Complaint ?Patient seen for complaints of Non-Healing Wound. ?Electronic Signature(s) ?Signed: 02/25/2022 2:17:17 PM By: Fredirick Maudlin MD FACS ?Entered By: Fredirick Maudlin on 02/25/2022 14:17:17 ?-------------------------------------------------------------------------------- ?Debridement Details ?Patient Name: Date of Service: ?[place], [name] 02/25/2022 1:15 PM ?Medical Record Number: [id number] ?Patient Account Number: [id number] ?Date of Birth/Sex: Treating RN: ?1946/06/23 (76 y.o. [name] ?Primary Care Provider: Teressa Lower Other Clinician: ?Referring Provider: ?Treating Provider/Extender: Fredirick Maudlin ?Teressa Lower ?Weeks in Treatment: 0 ?Debridement Performed for Assessment: Wound #1 Left,Anterior Lower Leg ?Performed By: Physician Fredirick Maudlin, MD ?Debridement Type: Debridement ?Severity of Tissue Pre Debridement: Fat layer exposed ?Level of Consciousness (Pre-procedure): Awake and Alert ?Pre-procedure Verification/Time Out Yes - 14:12 ?Taken: ?Start Time: 14:12 ?Pain Control: ?Other : Benzocaine 20% ?T Area Debrided (L x W): ?otal 1.9 (cm) x 1.2 (cm) = 2.28 (cm?) ?Tissue and other material debrided: Viable, Non-Viable, Slough, Subcutaneous, Slough ?Level: Skin/Subcutaneous Tissue ?Debridement Description: Excisional ?Instrument: Curette ?Bleeding: Minimum ?Hemostasis Achieved: Pressure ?Procedural Pain: 7 ?Post Procedural Pain: 2 ?Response to Treatment: Procedure was tolerated well ?Level of  Consciousness (Post- Awake and Alert ?procedure): ?Post Debridement Measurements of Total Wound ?Length: (cm) 1.9 ?Width: (cm) 1.2 ?Depth: (cm) 0.1 ?Volume: (cm?) 0.179 ?Character of Wound/Ulcer Post Debridement: Improved ?Severity of Tissue Post Debridement: Fat layer exposed ?Post Procedure Diagnosis ?Same as Pre-procedure ?Electronic Signature(s) ?Signed: 02/25/2022 4:42:51 PM By: Fredirick Maudlin MD FACS ?Signed: 02/25/2022 5:03:59 PM By: Adline Peals ?Entered By: Adline Peals on 02/25/2022 14:12:54 ?-------------------------------------------------------------------------------- ?Debridement Details ?Patient Name: ?Date of Service: ?[place], [name] 02/25/2022 1:15 PM ?Medical Record Number: [id number] ?Patient Account Number: [id number] ?Date of Birth/Sex: ?Treating RN: ?08-28-1946 (76 y.o. [name] ?Primary Care Provider: Teressa Lower ?Other Clinician: ?Referring Provider: ?Treating Provider/Extender: Fredirick Maudlin ?Teressa Lower ?Weeks in Treatment: 0 ?Debridement Performed for Assessment: Wound #2 Left,Distal,Anterior Lower Leg ?Performed By: Physician Fredirick Maudlin, MD ?Debridement Type: Debridement ?Severity of Tissue Pre Debridement: Fat layer exposed ?Level of Consciousness (Pre-procedure): Awake and Alert ?Pre-procedure Verification/Time Out Yes - 14:12 ?Taken: ?Start Time: 14:12 ?Pain Control: ?Other : Benzocaine 20% ?T Area Debrided (L x W): ?otal 1.5 (cm) x 1.6 (cm) = 2.4 (cm?) ?Tissue and other material debrided: Viable, Non-Viable, Slough, Subcutaneous, Slough ?Level: Skin/Subcutaneous Tissue ?Debridement Description: Excisional ?Instrument: Curette ?Bleeding: Minimum ?Hemostasis Achieved: Pressure ?Procedural Pain: 7 ?Post Procedural Pain: 2 ?Response to Treatment: Procedure was tolerated well ?Level of Consciousness (Post- Awake and Alert ?procedure): ?Post Debridement Measurements of Total Wound ?Length: (cm) 1.5 ?Width: (cm) 1.6 ?Depth: (cm) 0.1 ?Volume: (cm?)  0.188 ?Character of Wound/Ulcer Post Debridement: Improved ?Severity of Tissue Post Debridement: Fat layer exposed ?Post Procedure Diagnosis ?Same as Pre-procedure ?Electronic Signature(s) ?Signed: 02/25/2022 4:42:51 PM By: Fredirick Maudlin MD FACS ?Signed: 02/25/2022 5:03:59 PM By: Adline Peals ?Entered By: Adline Peals on 02/25/2022 14:13:21 ?-------------------------------------------------------------------------------- ?Debridement Details ?Patient Name: ?Date of Service: ?[place], [name] 02/25/2022 1:15 PM ?Medical Record Number: [id number] ?Patient Account Number: [id number] ?Date of Birth/Sex: Treating RN: ?1945-12-30 (76 y.o. [name] ?Primary Care Provider: Teressa Lower Other Clinician: ?Referring Provider: ?Treating Provider/Extender: Fredirick Maudlin ?Teressa Lower ?Weeks in Treatment: 0 ?Debridement Performed  for Assessment: Wound #3 Left,Medial Lower Leg ?Performed By: Physician Fredirick Maudlin, MD ?Debridement Type: Debridement ?Severity of Tissue Pre Debridement: Fat layer exposed ?Level of Consciousness (Pre-procedure): Awake and Alert ?Pre-procedure Verification/Time Out Yes - 14:12 ?Taken: ?Start Time: 14:12 ?Pain Control: ?Other : Benzocaine 20% ?T Area Debrided (L x W): ?otal 1 (cm) x 1.4 (cm) = 1.4 (cm?) ?Tissue and other material debrided: Viable, Non-Viable, Slough, Subcutaneous, Slough ?Level: Skin/Subcutaneous Tissue ?Debridement Description: Excisional ?Instrument: Curette ?Bleeding: Minimum ?Hemostasis Achieved: Pressure ?Procedural Pain: 7 ?Post Procedural Pain: 2 ?Response to Treatment: Procedure was tolerated well ?Level of Consciousness (Post- Awake and Alert ?procedure): ?Post Debridement Measurements of Total Wound ?Length: (cm) 1 ?Width: (cm) 1.4 ?Depth: (cm) 0.1 ?Volume: (cm?) 0.11 ?Character of Wound/Ulcer Post Debridement: Improved ?Severity of Tissue Post Debridement: Fat layer exposed ?Post Procedure Diagnosis ?Same as Pre-procedure ?Electronic  Signature(s) ?Signed: 02/25/2022 4:42:51 PM By: Fredirick Maudlin MD FACS ?Signed: 02/25/2022 5:03:59 PM By: Adline Peals ?Entered By: Adline Peals on 02/25/2022 14:13:45 ?-------------------------------------------------------------------------------- ?HPI Details ?Patient Name: Date of Service: ?[place], [name] 02/25/2022 1:15 PM ?Medical Record Number: [id number] ?Patient Account Number: [id number] ?Date of Birth/Sex: Treating RN: ?[birth year] (76 y.o. F) [name], [name] ?Primary Care Provider: Teressa Lower Other Clinician: ?Referring Provider: ?Treating Provider/Extender: Fredirick Maudlin ?Teressa Lower ?Weeks in Treatment: 0 ?History of Present Illness ?HPI Description: ADMISSION ?02/19/2022 ?This is a 76 year old woman with minimal pertinent medical history. She does have COPD and pulmonary hypertension, but is not diabetic and is not a smoker. ?About 6 months ago, she and her husband got a new beagle puppy. The puppy scratched her leg and the scratches ultimately deteriorated into ulcerations. ?Apparently she sought care with a dermatologist who recommended applying a one-to-one mixture of peroxide and water to the wounds followed by a thick ?layer of Vaseline. She continue this for some time but then saw her primary care provider who told her to discontinue the peroxide. She has not been on any ?antibiotics for the scratches. Today, there are 3 separate wounds on her anterior tibial surface on the left. They are tender and her leg has localized swelling. ?There is yellow slough buildup in each of the wound bases. ABI in clinic today was normal at 1.16. 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 ?02/25/2022: Over the past week, the wounds themselves have not improved tremendously but she has noticed some stinging and the periwound skin is quite ?inflamed. I took a culture last week that had low levels of methicillin-resistant Staph aureus. Because it was fairly low level, I did not  prescribe an oral ?antibiotic. I had planned to change her to mupirocin today, however. We have been using Santyl under Hydrofera Blue with 3 layer compression. ?Electronic Signature(s) ?Signed: 02/25/2022 2:33:00 PM By: Alethia Berthold

## 2022-03-04 ENCOUNTER — Encounter (HOSPITAL_BASED_OUTPATIENT_CLINIC_OR_DEPARTMENT_OTHER): Payer: PPO | Admitting: General Surgery

## 2022-03-04 DIAGNOSIS — L97222 Non-pressure chronic ulcer of left calf with fat layer exposed: Secondary | ICD-10-CM | POA: Diagnosis not present

## 2022-03-04 NOTE — Progress Notes (Addendum)
BRIANNIE, GUTIERREZ ([id number]) ?Visit Report for 03/04/2022 ?Chief Complaint Document Details ?Patient Name: Date of Service: ?[place], [name] 03/04/2022 1:15 PM ?Medical Record Number: [id number] ?Patient Account Number: [id number] ?Date of Birth/Sex: Treating RN: ?09-17-1946 (76 y.o. F) [name], [name] ?Primary Care Provider: Teressa Lower Other Clinician: ?Referring Provider: ?Treating Provider/Extender: Fredirick Maudlin ?Teressa Lower ?Weeks in Treatment: 1 ?Information Obtained from: Patient ?Chief Complaint ?Patient seen for complaints of Non-Healing Wound. ?Electronic Signature(s) ?Signed: 03/04/2022 1:57:20 PM By: Fredirick Maudlin MD FACS ?Entered By: Fredirick Maudlin on 03/04/2022 13:57:20 ?-------------------------------------------------------------------------------- ?Debridement Details ?Patient Name: Date of Service: ?[place], [name] 03/04/2022 1:15 PM ?Medical Record Number: [id number] ?Patient Account Number: [id number] ?Date of Birth/Sex: Treating RN: ?August 20, 1946 (76 y.o. F) [place], Mechele Claude ?Primary Care Provider: Teressa Lower Other Clinician: ?Referring Provider: ?Treating Provider/Extender: Fredirick Maudlin ?Teressa Lower ?Weeks in Treatment: 1 ?Debridement Performed for Assessment: Wound #3 Left,Medial Lower Leg ?Performed By: Physician Fredirick Maudlin, MD ?Debridement Type: Debridement ?Severity of Tissue Pre Debridement: Fat layer exposed ?Level of Consciousness (Pre-procedure): Awake and Alert ?Pre-procedure Verification/Time Out Yes - 13:51 ?Taken: ?Start Time: 13:51 ?T Area Debrided (L x W): ?otal 0.9 (cm) x 0.7 (cm) = 0.63 (cm?) ?Tissue and other material debrided: Non-Viable, [place], [place] ?Level: Non-Viable Tissue ?Debridement Description: Selective/Open Wound ?Instrument: Curette ?Bleeding: Minimum ?Hemostasis Achieved: Pressure ?End Time: 13:52 ?Procedural Pain: 0 ?Post Procedural Pain: 0 ?Response to Treatment: Procedure was tolerated well ?Level of Consciousness (Post- Awake and  Alert ?procedure): ?Post Debridement Measurements of Total Wound ?Length: (cm) 0.9 ?Width: (cm) 0.7 ?Depth: (cm) 0.1 ?Volume: (cm?) 0.049 ?Character of Wound/Ulcer Post Debridement: Improved ?Severity of Tissue Post Debridement: Fat layer exposed ?Post Procedure Diagnosis ?Same as Pre-procedure ?Electronic Signature(s) ?Signed: 03/04/2022 2:01:53 PM By: Fredirick Maudlin MD FACS ?Signed: 03/04/2022 5:57:29 PM By: Dellie Catholic RN ?Entered By: Dellie Catholic on 03/04/2022 13:53:36 ?-------------------------------------------------------------------------------- ?Debridement Details ?Patient Name: ?Date of Service: ?[place], [name] 03/04/2022 1:15 PM ?Medical Record Number: [id number] ?Patient Account Number: [id number] ?Date of Birth/Sex: ?Treating RN: ?August 16, 1946 (76 y.o. F) [place], Mechele Claude ?Primary Care Provider: Teressa Lower ?Other Clinician: ?Referring Provider: ?Treating Provider/Extender: Fredirick Maudlin ?Teressa Lower ?Weeks in Treatment: 1 ?Debridement Performed for Assessment: Wound #1 Left,Anterior Lower Leg ?Performed By: Physician Fredirick Maudlin, MD ?Debridement Type: Debridement ?Severity of Tissue Pre Debridement: Fat layer exposed ?Level of Consciousness (Pre-procedure): Awake and Alert ?Pre-procedure Verification/Time Out Yes - 13:51 ?Taken: ?Start Time: 13:51 ?T Area Debrided (L x W): ?otal 1.6 (cm) x 1 (cm) = 1.6 (cm?) ?Tissue and other material debrided: Non-Viable, [place], [place] ?Level: Non-Viable Tissue ?Debridement Description: Selective/Open Wound ?Instrument: Curette ?Bleeding: Minimum ?Hemostasis Achieved: Pressure ?End Time: 13:52 ?Procedural Pain: 0 ?Post Procedural Pain: 0 ?Response to Treatment: Procedure was tolerated well ?Level of Consciousness (Post- Awake and Alert ?procedure): ?Post Debridement Measurements of Total Wound ?Length: (cm) 1.6 ?Width: (cm) 1 ?Depth: (cm) 0.1 ?Volume: (cm?) 0.126 ?Character of Wound/Ulcer Post Debridement: Improved ?Severity of Tissue Post Debridement: Fat  layer exposed ?Post Procedure Diagnosis ?Same as Pre-procedure ?Electronic Signature(s) ?Signed: 03/04/2022 2:01:53 PM By: Fredirick Maudlin MD FACS ?Signed: 03/04/2022 5:57:29 PM By: Dellie Catholic RN ?Entered By: Dellie Catholic on 03/04/2022 13:54:20 ?-------------------------------------------------------------------------------- ?Debridement Details ?Patient Name: ?Date of Service: ?[place], [name] 03/04/2022 1:15 PM ?Medical Record Number: [id number] ?Patient Account Number: [id number] ?Date of Birth/Sex: Treating RN: ?1946-09-26 (76 y.o. F) [place], Mechele Claude ?Primary Care Provider: Teressa Lower Other Clinician: ?Referring Provider: ?Treating Provider/Extender: Fredirick Maudlin ?Teressa Lower ?Weeks in Treatment: 1 ?Debridement Performed for Assessment: Wound #2 Left,Distal,Anterior Lower  Leg ?Performed By: Physician Fredirick Maudlin, MD ?Debridement Type: Debridement ?Severity of Tissue Pre Debridement: Fat layer exposed ?Level of Consciousness (Pre-procedure): Awake and Alert ?Pre-procedure Verification/Time Out Yes - 13:51 ?Taken: ?Start Time: 13:51 ?T Area Debrided (L x W): ?otal 1.2 (cm) x 1.5 (cm) = 1.8 (cm?) ?Tissue and other material debrided: Non-Viable, [place], [place] ?Level: Non-Viable Tissue ?Debridement Description: Selective/Open Wound ?Instrument: Curette ?Bleeding: Minimum ?Hemostasis Achieved: Pressure ?End Time: 13:52 ?Procedural Pain: 0 ?Post Procedural Pain: 0 ?Response to Treatment: Procedure was tolerated well ?Level of Consciousness (Post- Awake and Alert ?procedure): ?Post Debridement Measurements of Total Wound ?Length: (cm) 1.2 ?Width: (cm) 1.5 ?Depth: (cm) 0.1 ?Volume: (cm?) 0.141 ?Character of Wound/Ulcer Post Debridement: Improved ?Severity of Tissue Post Debridement: Fat layer exposed ?Post Procedure Diagnosis ?Same as Pre-procedure ?Electronic Signature(s) ?Signed: 03/04/2022 2:01:53 PM By: Fredirick Maudlin MD FACS ?Signed: 03/04/2022 5:57:29 PM By: Dellie Catholic RN ?Entered By: Dellie Catholic on 03/04/2022 13:54:45 ?-------------------------------------------------------------------------------- ?HPI Details ?Patient Name: Date of Service: ?[place], [name] 03/04/2022 1:15 PM ?Medical Record Number: [id number] ?Patient Account Number: [id number] ?Date of Birth/Sex: Treating RN: ?18-Dec-1945 (76 y.o. F) [name], [name] ?Primary Care Provider: Teressa Lower Other Clinician: ?Referring Provider: ?Treating Provider/Extender: Fredirick Maudlin ?Teressa Lower ?Weeks in Treatment: 1 ?History of Present Illness ?HPI Description: ADMISSION ?02/19/2022 ?This is a 76 year old woman with minimal pertinent medical history. She does have COPD and pulmonary hypertension, but is not diabetic and is not a smoker. ?About 6 months ago, she and her husband got a new beagle puppy. The puppy scratched her leg and the scratches ultimately deteriorated into ulcerations. ?Apparently she sought care with a dermatologist who recommended applying a one-to-one mixture of peroxide and water to the wounds followed by a thick ?layer of Vaseline. She continue this for some time but then saw her primary care provider who told her to discontinue the peroxide. She has not been on any ?antibiotics for the scratches. Today, there are 3 separate wounds on her anterior tibial surface on the left. They are tender and her leg has localized swelling. ?There is yellow slough buildup in each of the wound bases. ABI in clinic today was normal at 1.16. 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 ?02/25/2022: Over the past week, the wounds themselves have not improved tremendously but she has noticed some stinging and the periwound skin is quite ?inflamed. I took a culture last week that had low levels of methicillin-resistant Staph aureus. Because it was fairly low level, I did not prescribe an oral ?antibiotic. I had planned to change her to mupirocin today, however. We have been using Santyl under Hydrofera Blue with 3 layer  compression. ?03/04/2022: The wounds have improved quite a bit over the past week. They are less painful and red. She has been taking the prescribed doxycycline but says ?that it gives her a stomachache. We have been using mup

## 2022-03-06 NOTE — Progress Notes (Signed)
ASA, FATH ([id number]) ?Visit Report for 03/04/2022 ?Arrival Information Details ?Patient Name: Date of Service: ?[place], [name] 03/04/2022 1:15 PM ?Medical Record Number: [id number] ?Patient Account Number: [id number] ?Date of Birth/Sex: Treating RN: ?1946/01/03 (76 y.o. F) [place], Mechele Claude ?Primary Care Subhan Hoopes: Teressa Lower Other Clinician: ?Referring Jilda Kress: ?Treating Aviyah Swetz/Extender: Fredirick Maudlin ?Teressa Lower ?Weeks in Treatment: 1 ?Visit Information History Since Last Visit ?Added or deleted any medications: No ?Patient Arrived: Ambulatory ?Any new allergies or adverse reactions: No ?Arrival Time: 13:36 ?Had a fall or experienced change in No ?Accompanied By: spouse ?activities of daily living that may affect ?Transfer Assistance: None ?risk of falls: ?Patient Identification Verified: Yes ?Signs or symptoms of abuse/neglect since last visito No ?Patient Requires Transmission-Based Precautions: No ?Hospitalized since last visit: No ?Patient Has Alerts: No ?Implantable device outside of the clinic excluding No ?cellular tissue based products placed in the center ?since last visit: ?Has Dressing in Place as Prescribed: Yes ?Pain Present Now: No ?Electronic Signature(s) ?Signed: 03/04/2022 5:57:29 PM By: Dellie Catholic RN ?Entered By: Dellie Catholic on 03/04/2022 13:37:23 ?-------------------------------------------------------------------------------- ?Compression Therapy Details ?Patient Name: Date of Service: ?[place], [name] 03/04/2022 1:15 PM ?Medical Record Number: [id number] ?Patient Account Number: [id number] ?Date of Birth/Sex: Treating RN: ?1946-05-09 (76 y.o. F) [place], Mechele Claude ?Primary Care Carnella Fryman: Teressa Lower Other Clinician: ?Referring Janeshia Ciliberto: ?Treating Rayanne Padmanabhan/Extender: Fredirick Maudlin ?Teressa Lower ?Weeks in Treatment: 1 ?Compression Therapy Performed for Wound Assessment: Wound #1 Left,Anterior Lower Leg ?Performed By: Clinician Dellie Catholic, RN ?Compression Type: Three Layer ?Post  Procedure Diagnosis ?Same as Pre-procedure ?Electronic Signature(s) ?Signed: 03/04/2022 5:57:29 PM By: Dellie Catholic RN ?Entered By: Dellie Catholic on 03/04/2022 13:55:07 ?-------------------------------------------------------------------------------- ?Compression Therapy Details ?Patient Name: ?Date of Service: ?[place], [name] 03/04/2022 1:15 PM ?Medical Record Number: [id number] ?Patient Account Number: [id number] ?Date of Birth/Sex: ?Treating RN: ?1946-03-05 (76 y.o. F) Scotton, Mechele Claude ?Primary Care Kimiyah Blick: Teressa Lower ?Other Clinician: ?Referring Jessalyn Hinojosa: ?Treating Fahed Morten/Extender: Fredirick Maudlin ?Teressa Lower ?Weeks in Treatment: 1 ?Compression Therapy Performed for Wound Assessment: Wound #2 Left,Distal,Anterior Lower Leg ?Performed By: Clinician Dellie Catholic, RN ?Compression Type: Three Layer ?Post Procedure Diagnosis ?Same as Pre-procedure ?Electronic Signature(s) ?Signed: 03/04/2022 5:57:29 PM By: Dellie Catholic RN ?Entered By: Dellie Catholic on 03/04/2022 13:55:07 ?-------------------------------------------------------------------------------- ?Compression Therapy Details ?Patient Name: ?Date of Service: ?[place], [name] 03/04/2022 1:15 PM ?Medical Record Number: [id number] ?Patient Account Number: [id number] ?Date of Birth/Sex: ?Treating RN: ?[birth year] (76 y.o. F) Scotton, Mechele Claude ?Primary Care Lyndel Dancel: Teressa Lower ?Other Clinician: ?Referring Christina Waldrop: ?Treating Geneieve Duell/Extender: Fredirick Maudlin ?Teressa Lower ?Weeks in Treatment: 1 ?Compression Therapy Performed for Wound Assessment: Wound #3 Left,Medial Lower Leg ?Performed By: Clinician Dellie Catholic, RN ?Compression Type: Three Layer ?Post Procedure Diagnosis ?Same as Pre-procedure ?Electronic Signature(s) ?Signed: 03/04/2022 5:57:29 PM By: Dellie Catholic RN ?Entered By: Dellie Catholic on 03/04/2022 13:55:07 ?-------------------------------------------------------------------------------- ?Encounter Discharge Information  Details ?Patient Name: ?Date of Service: ?[place], [name] 03/04/2022 1:15 PM ?Medical Record Number: [id number] ?Patient Account Number: [id number] ?Date of Birth/Sex: ?Treating RN: ?1946/06/24 (76 y.o. Benjamine Sprague, Shatara ?Primary Care Mariadel Mruk: Teressa Lower ?Other Clinician: ?Referring Keva Darty: ?Treating Tashana Haberl/Extender: Fredirick Maudlin ?Teressa Lower ?Weeks in Treatment: 1 ?Encounter Discharge Information Items Post Procedure Vitals ?Discharge Condition: Stable ?Temperature (F): 98.5 ?Ambulatory Status: Ambulatory ?Pulse (bpm): 108 ?Discharge Destination: Home ?Respiratory Rate (breaths/min): 18 ?Transportation: Private Auto ?Blood Pressure (mmHg): 156/70 ?Accompanied By: husband ?Schedule Follow-up Appointment: Yes ?Clinical Summary of Care: Patient Declined ?Electronic Signature(s) ?Signed: 03/06/2022 5:21:10 PM By: Levan Hurst RN, BSN ?Entered By: Levan Hurst on 03/04/2022 17:04:27 ?-------------------------------------------------------------------------------- ?  Lower Extremity Assessment Details ?Patient Name: ?Date of Service: ?[place], [name] 03/04/2022 1:15 PM ?Medical Record Number: [id number] ?Patient Account Number: [id number] ?Date of Birth/Sex: ?Treating RN: ?1946-09-24 (76 y.o. F) [place]ton, Mechele Claude ?Primary Care Wetzel Meester: Teressa Lower ?Other Clinician: ?Referring Krosby Ritchie: ?Treating Hiawatha Merriott/Extender: Fredirick Maudlin ?Teressa Lower ?Weeks in Treatment: 1 ?Edema Assessment ?Assessed: [Left: No] [Right: No] ?E[Left: dema] [Right: :] ?Calf ?Left: Right: ?Point of Measurement: 33 cm From Medial Instep 29.5 cm ?Ankle ?Left: Right: ?Point of Measurement: 9 cm From Medial Instep 19.3 cm ?Electronic Signature(s) ?Signed: 03/04/2022 5:57:29 PM By: Dellie Catholic RN ?Entered By: Dellie Catholic on 03/04/2022 13:40:54 ?-------------------------------------------------------------------------------- ?Multi Wound Chart Details ?Patient Name: ?Date of Service: ?[place], [name] 03/04/2022 1:15 PM ?Medical Record  Number: [id number] ?Patient Account Number: [id number] ?Date of Birth/Sex: ?Treating RN: ?04-21-1946 (76 y.o. 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] [2:Venous Leg Ulcer] [3:Venous Leg Ulcer] ?Primary Etiology: [1:Cataracts, Anemia, Chronic] [2:Cataracts, Anemia, Chronic] [3:Cataracts, Anemia, Chronic] ?Comorbid History: [1:Obstructive Pulmonary Disease (COPD), Coronary Artery Disease, Peripheral Venous Disease, Raynauds, Rheumatoid Arthritis, Osteoarthritis 08/06/2021] [2:Obstructive Pulmonary Disease (COPD), Coronary Artery Disease, Peripheral Venous  ?Disease, Raynauds, Rheumatoid Arthritis, Osteoarthritis 08/06/2021] [3:Obstructive Pulmonary Disease (COPD), Coronary Artery Disease, Peripheral Venous Disease, Raynauds, Rheumatoid Arthritis, Osteoarthritis 08/06/2021] ?Date Acquired: [1:1] [2:1] [3:1] ?Weeks of Treatment: [1:Open] [2:Open] [3:Open] ?Wound Status: [1:No] [2:No] [3:No] ?Wound Recurrence: [1:1.6x1x0.1] [2:1.2x1.5x0.1] [3:0.9x0.7x0.1] ?Measurements L x W x D (cm) [1:1.257] [2:1.414] [3:0.495] ?A (cm?) : ?rea [1:0.126] [2:0.141] [3:0.049] ?Volume (cm?) : [1:14.40%] [2:-12.50%] [3:36.90%] ?% Reduction in A [1:rea: 14.30%] [2:-11.90%] [3:38.00%] ?% Reduction in Volume: [1:Full Thickness Without Exposed] [2:Full Thickness Without Exposed] [3:Full Thickness Without Exposed] ?Classification: [1:Support Structures Medium] [2:Support Structures Medium] [3:Support Structures Medium] ?Exudate A mount:  [1:Serosanguineous] [2:Serosanguineous] [3:Serosanguineous] ?Exudate Type: [1:red, brown] [2:red, brown] [3:red, brown] ?Exudate Color: [1:Flat and Intact] [2:Flat and Intact] [3:Flat and Intact] ?Wound Margin: [1:Small (1-33%)] [2

## 2022-03-11 ENCOUNTER — Encounter (HOSPITAL_BASED_OUTPATIENT_CLINIC_OR_DEPARTMENT_OTHER): Payer: PPO | Admitting: General Surgery

## 2022-03-11 DIAGNOSIS — L97222 Non-pressure chronic ulcer of left calf with fat layer exposed: Secondary | ICD-10-CM | POA: Diagnosis not present

## 2022-03-12 NOTE — Progress Notes (Signed)
[name], [name] ([id number]) ?Visit Report for 03/11/2022 ?Arrival Information Details ?Patient Name: Date of Service: ?[place], [name] 03/11/2022 1:15 PM ?Medical Record Number: [id number] ?Patient Account Number: [id number] ?Date of Birth/Sex: Treating RN: ?01/25/1946 (76 y.o. [name] ?Primary Care [name]: Teressa Lower Other Clinician: ?Referring Aurelie Dicenzo: ?Treating Philopateer Strine/Extender: Fredirick Maudlin ?Teressa Lower ?Weeks in Treatment: 2 ?Visit Information History Since Last Visit ?Added or deleted any medications: No ?Patient Arrived: Ambulatory ?Any new allergies or adverse reactions: No ?Arrival Time: 13:37 ?Had a fall or experienced change in No ?Accompanied By: spouse ?activities of daily living that may affect ?Transfer Assistance: None ?risk of falls: ?Patient Identification Verified: Yes ?Signs or symptoms of abuse/neglect since last visito No ?Secondary Verification Process Completed: Yes ?Hospitalized since last visit: No ?Patient Requires Transmission-Based Precautions: No ?Implantable device outside of the clinic excluding No ?Patient Has Alerts: No ?cellular tissue based products placed in the center ?since last visit: ?Has Dressing in Place as Prescribed: Yes ?Has Compression in Place as Prescribed: Yes ?Pain Present Now: No ?Electronic Signature(s) ?Signed: 03/11/2022 5:12:22 PM By: Adline Peals ?Entered By: Adline Peals on 03/11/2022 13:40:16 ?-------------------------------------------------------------------------------- ?Compression Therapy Details ?Patient Name: Date of Service: ?[name], [name] 03/11/2022 1:15 PM ?Medical Record Number: [id number] ?Patient Account Number: [id number] ?Date of Birth/Sex: Treating RN: ?September 20, 1946 (76 y.o. F) [name] ?Primary Care Kumari Sculley: Teressa Lower Other Clinician: ?Referring Seymour Pavlak: ?Treating Banesa Tristan/Extender: Fredirick Maudlin ?Teressa Lower ?Weeks in Treatment: 2 ?Compression Therapy Performed for Wound Assessment: Wound #1  Left,Anterior Lower Leg ?Performed By: Clinician Elaine Creamer, RN ?Compression Type: Three Layer ?Post Procedure Diagnosis ?Same as Pre-procedure ?Electronic Signature(s) ?Signed: 03/11/2022 4:04:33 PM By: Elaine Creamer RN, BSN ?Entered By: [name] on 03/11/2022 14:08:12 ?-------------------------------------------------------------------------------- ?Encounter Discharge Information Details ?Patient Name: ?Date of Service: ?[name], [name] 03/11/2022 1:15 PM ?Medical Record Number: [id number] ?Patient Account Number: [id number] ?Date of Birth/Sex: ?Treating RN: ?Dec 01, 1945 (76 y.o. F) [name] ?Primary Care Charlottie Peragine: Teressa Lower ?Other Clinician: ?Referring Malashia Kamaka: ?Treating Yanelli Zapanta/Extender: Fredirick Maudlin ?Teressa Lower ?Weeks in Treatment: 2 ?Encounter Discharge Information Items Post Procedure Vitals ?Discharge Condition: Stable ?Temperature (F): 98.4 ?Ambulatory Status: Ambulatory ?Pulse (bpm): 108 ?Discharge Destination: Home ?Respiratory Rate (breaths/min): 18 ?Transportation: Private Auto ?Blood Pressure (mmHg): 153/72 ?Accompanied By: spouse ?Schedule Follow-up Appointment: Yes ?Clinical Summary of Care: Patient Declined ?Electronic Signature(s) ?Signed: 03/11/2022 4:04:33 PM By: Elaine Creamer RN, BSN ?Entered By: [name] on 03/11/2022 14:28:07 ?-------------------------------------------------------------------------------- ?Lower Extremity Assessment Details ?Patient Name: ?Date of Service: ?[name], [name] 03/11/2022 1:15 PM ?Medical Record Number: [id number] ?Patient Account Number: [id number] ?Date of Birth/Sex: ?Treating RN: ?[birth year] (76 y.o. [name] ?Primary Care Hether Anselmo: Teressa Lower ?Other Clinician: ?Referring Domini Vandehei: ?Treating Wade Asebedo/Extender: Fredirick Maudlin ?Teressa Lower ?Weeks in Treatment: 2 ?Edema Assessment ?Assessed: [Left: No] [Right: No] ?E[Left: dema] [Right: :] ?Calf ?Left: Right: ?Point of Measurement: 33 cm From Medial Instep 29.8  cm ?Ankle ?Left: Right: ?Point of Measurement: 9 cm From Medial Instep 19.3 cm ?Vascular Assessment ?Pulses: ?Dorsalis Pedis ?Palpable: [Left:Yes] ?Electronic Signature(s) ?Signed: 03/11/2022 5:12:22 PM By: Adline Peals ?Entered By: Adline Peals on 03/11/2022 13:45:30 ?-------------------------------------------------------------------------------- ?Multi Wound Chart Details ?Patient Name: ?Date of Service: ?[name], [name] 03/11/2022 1:15 PM ?Medical Record Number: [id number] ?Patient Account Number: [id number] ?Date of Birth/Sex: ?Treating RN: ?[birth year] (76 y.o. F) [name] ?Primary Care Jet Traynham: Teressa Lower ?Other Clinician: ?Referring Zekiel Torian: ?Treating Loras Grieshop/Extender: Fredirick Maudlin ?Teressa Lower ?Weeks in Treatment: 2 ?Vital Signs ?Height(in): 61 ?Pulse(bpm): 108 ?Weight(lbs): 120 ?Blood Pressure(mmHg): 153/72 ?Body Mass Index(BMI): 22.7 ?  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] [3:Venous Leg Ulcer] ?Primary Etiology: [1:Cataracts, Anemia, Chronic] [2:Cataracts, Anemia, Chronic] [3:Cataracts, Anemia, Chronic] ?Comorbid History: [1:Obstructive Pulmonary Disease (COPD), Coronary Artery Disease, Peripheral Venous Disease, Raynauds, Rheumatoid Arthritis, Osteoarthritis 08/06/2021] [2:Obstructive Pulmonary Disease (COPD), Coronary Artery Disease, Peripheral Venous  ?Disease, Raynauds, Rheumatoid Arthritis, Osteoarthritis 08/06/2021] [3:Obstructive Pulmonary Disease (COPD), Coronary Artery Disease, Peripheral Venous Disease, Raynauds, Rheumatoid Arthritis, Osteoarthritis 08/06/2021] ?Date Acquired: [1:2] [2:2] [3:2] ?Weeks of Treatment: [1:Open] [2:Open] [3:Open] ?Wound Status: [1:No] [2:No] [3:No] ?Wound Recurrence: [1:1.3x0.7x0.1]  [2:1x1.1x0.1] [3:0.5x0.5x0.1] ?Measurements L x W x D (cm) [1:0.715] [2:0.864] [3:0.196] ?A (cm?) : ?rea [1:0.071] [2:0.086] [3:0.02] ?Volume (cm?) : [1:51.30%] [2:31.30%] [3:75.00%] ?% Reduction in A [1:rea: 51.70%] [2:31.70%] [3:74.70%] ?% Reduction in Volume: [1:Full Thickness Without Exposed] [2:Full Thickness Without Exposed] [3:Full Thickness Without Exposed] ?Classification: [1:Support Structures Medium] [2:Support Structures Medium] [3:Support Structures Medium] ?Exudate A mount: [1:Serosanguineous] [2:Serosanguineous] [3:Serosanguineous] ?Exudate Type: [1:red, brown] [2:red, brown] [3:red, brown] ?Exudate Color: [1:Flat and Intact] [2:Flat and Intact] [3:Flat and Intact] ?Wound Margin: [1:Large (67-100%)] [2:Large (67-100%)] [3:Large (67-100%)] ?Granulation A mount: [1:Red, Pink] [2:Pink] [3:Pink] ?Granulation Quality: [1:Small (1-33%)] [2:Small (1-33%)] [3:Small (1-33%)] ?Necrotic A mount: ?[1:Fat Layer (Subcutaneous Tissue): Yes Fat Layer (Subcutaneous Tissue): Yes Fat Layer (Subcutaneous Tissue): Yes] ?Exposed Structures: ?[1:Fascia: No Tendon: No Muscle: No Joint: No Bone: No Small (1-33%)] [2:Fascia: No Tendon: No Muscle: No Joint: No Bone: No Small (1-33%)] [3:Fascia: No Tendon: No Muscle: No Joint: No Bone: No Small (1-33%)] ?Epithelialization: [1:Debridement - Selective/Open Wound Debridement - Selective/Open Wound Debridement - Selective/Open Wound] ?Debridement: ?Pre-procedure Verification/Time Out 14:04 [2:14:04] [3:14:04] ?Taken: [1:Other] [2:Other] [3:Other] ?Pain Control: [1:Necrotic/Eschar] [2:Necrotic/Eschar] [3:Necrotic/Eschar, Slough] ?Tissue Debrided: [1:Non-Viable Tissue] [2:Non-Viable Tissue] [3:Non-Viable Tissue] ?Level: [1:0.91] [2:1.1] [3:0.25] ?Debridement A (sq cm): [1:rea Curette] [2:Curette] [3:Curette] ?Instrument: [1:Minimum] [2:Minimum] [3:Minimum] ?Bleeding: [1:Pressure] [2:Pressure] [3:Pressure] ?Hemostasis A chieved: [1:0] [2:0] [3:0] ?Procedural Pain: [1:0] [2:0]  [3:0] ?Post Procedural Pain: [1:Procedure was tolerated well] [2:Procedure was tolerated well] [3:Procedure was tolerated well] ?Debridement Treatment Response: [1:1.3x0.7x0.1] [2:1x1.1x0.1] [3:0.5x0.5x0.1] ?Post Debridemen

## 2022-03-12 NOTE — Progress Notes (Signed)
ZAEDA, MCFERRAN ([id number]) ?Visit Report for 03/11/2022 ?Chief Complaint Document Details ?Patient Name: Date of Service: ?[place], [name] 03/11/2022 1:15 PM ?Medical Record Number: [id number] ?Patient Account Number: [id number] ?Date of Birth/Sex: Treating RN: ?1946/02/23 (76 y.o. F) [name], [name] ?Primary Care Provider: Teressa Lower Other Clinician: ?Referring Provider: ?Treating Provider/Extender: Fredirick Maudlin ?Teressa Lower ?Weeks in Treatment: 2 ?Information Obtained from: Patient ?Chief Complaint ?Patient seen for complaints of Non-Healing Wound. ?Electronic Signature(s) ?Signed: 03/11/2022 2:10:19 PM By: Fredirick Maudlin MD FACS ?Entered By: Fredirick Maudlin on 03/11/2022 14:10:18 ?-------------------------------------------------------------------------------- ?Debridement Details ?Patient Name: Date of Service: ?[place], [name] 03/11/2022 1:15 PM ?Medical Record Number: [id number] ?Patient Account Number: [id number] ?Date of Birth/Sex: Treating RN: ?1946-04-15 (76 y.o. F) Sharyn Creamer ?Primary Care Provider: Teressa Lower Other Clinician: ?Referring Provider: ?Treating Provider/Extender: Fredirick Maudlin ?Teressa Lower ?Weeks in Treatment: 2 ?Debridement Performed for Assessment: Wound #1 Left,Anterior Lower Leg ?Performed By: Physician Fredirick Maudlin, MD ?Debridement Type: Debridement ?Severity of Tissue Pre Debridement: Fat layer exposed ?Level of Consciousness (Pre-procedure): Awake and Alert ?Pre-procedure Verification/Time Out Yes - 14:04 ?Taken: ?Start Time: 14:04 ?Pain Control: ?Other : Benzocaine 20% spray ?T Area Debrided (L x W): ?otal 1.3 (cm) x 0.7 (cm) = 0.91 (cm?) ?Tissue and other material debrided: Non-Viable, Eschar ?Level: Non-Viable Tissue ?Debridement Description: Selective/Open Wound ?Instrument: Curette ?Bleeding: Minimum ?Hemostasis Achieved: Pressure ?Procedural Pain: 0 ?Post Procedural Pain: 0 ?Response to Treatment: Procedure was tolerated well ?Level of Consciousness (Post- Awake  and Alert ?procedure): ?Post Debridement Measurements of Total Wound ?Length: (cm) 1.3 ?Width: (cm) 0.7 ?Depth: (cm) 0.1 ?Volume: (cm?) 0.071 ?Character of Wound/Ulcer Post Debridement: Improved ?Severity of Tissue Post Debridement: Fat layer exposed ?Post Procedure Diagnosis ?Same as Pre-procedure ?Electronic Signature(s) ?Signed: 03/11/2022 2:52:52 PM By: Fredirick Maudlin MD FACS ?Signed: 03/11/2022 4:04:33 PM By: Sharyn Creamer RN, BSN ?Entered By: Sharyn Creamer on 03/11/2022 14:06:20 ?-------------------------------------------------------------------------------- ?Debridement Details ?Patient Name: ?Date of Service: ?[place], [name] 03/11/2022 1:15 PM ?Medical Record Number: [id number] ?Patient Account Number: [id number] ?Date of Birth/Sex: ?Treating RN: ?1946-09-07 (76 y.o. F) Sharyn Creamer ?Primary Care Provider: Teressa Lower ?Other Clinician: ?Referring Provider: ?Treating Provider/Extender: Fredirick Maudlin ?Teressa Lower ?Weeks in Treatment: 2 ?Debridement Performed for Assessment: Wound #2 Left,Distal,Anterior Lower Leg ?Performed By: Physician Fredirick Maudlin, MD ?Debridement Type: Debridement ?Severity of Tissue Pre Debridement: Fat layer exposed ?Level of Consciousness (Pre-procedure): Awake and Alert ?Pre-procedure Verification/Time Out Yes - 14:04 ?Taken: ?Start Time: 14:04 ?Pain Control: ?Other : Benzocaine 20% spray ?T Area Debrided (L x W): ?otal 1 (cm) x 1.1 (cm) = 1.1 (cm?) ?Tissue and other material debrided: Non-Viable, Eschar ?Level: Non-Viable Tissue ?Debridement Description: Selective/Open Wound ?Instrument: Curette ?Bleeding: Minimum ?Hemostasis Achieved: Pressure ?Procedural Pain: 0 ?Post Procedural Pain: 0 ?Response to Treatment: Procedure was tolerated well ?Level of Consciousness (Post- Awake and Alert ?procedure): ?Post Debridement Measurements of Total Wound ?Length: (cm) 1 ?Width: (cm) 1.1 ?Depth: (cm) 0.1 ?Volume: (cm?) 0.086 ?Character of Wound/Ulcer Post Debridement:  Improved ?Severity of Tissue Post Debridement: Fat layer exposed ?Post Procedure Diagnosis ?Same as Pre-procedure ?Electronic Signature(s) ?Signed: 03/11/2022 2:52:52 PM By: Fredirick Maudlin MD FACS ?Signed: 03/11/2022 4:04:33 PM By: Sharyn Creamer RN, BSN ?Entered By: Sharyn Creamer on 03/11/2022 14:07:04 ?-------------------------------------------------------------------------------- ?Debridement Details ?Patient Name: ?Date of Service: ?[place], [name] 03/11/2022 1:15 PM ?Medical Record Number: [id number] ?Patient Account Number: [id number] ?Date of Birth/Sex: Treating RN: ?August 08, 1946 (76 y.o. F) Sharyn Creamer ?Primary Care Provider: Teressa Lower Other Clinician: ?Referring Provider: ?Treating Provider/Extender: Fredirick Maudlin ?Teressa Lower ?Weeks in Treatment: 2 ?  Debridement Performed for Assessment: Wound #3 Left,Medial Lower Leg ?Performed By: Physician Fredirick Maudlin, MD ?Debridement Type: Debridement ?Severity of Tissue Pre Debridement: Fat layer exposed ?Level of Consciousness (Pre-procedure): Awake and Alert ?Pre-procedure Verification/Time Out Yes - 14:04 ?Taken: ?Start Time: 14:04 ?Pain Control: ?Other : Benzocaine 20% spray ?T Area Debrided (L x W): ?otal 0.5 (cm) x 0.5 (cm) = 0.25 (cm?) ?Tissue and other material debrided: Non-Viable, Eschar, [place], [place] ?Level: Non-Viable Tissue ?Debridement Description: Selective/Open Wound ?Instrument: Curette ?Bleeding: Minimum ?Hemostasis Achieved: Pressure ?Procedural Pain: 0 ?Post Procedural Pain: 0 ?Response to Treatment: Procedure was tolerated well ?Level of Consciousness (Post- Awake and Alert ?procedure): ?Post Debridement Measurements of Total Wound ?Length: (cm) 0.5 ?Width: (cm) 0.5 ?Depth: (cm) 0.1 ?Volume: (cm?) 0.02 ?Character of Wound/Ulcer Post Debridement: Improved ?Severity of Tissue Post Debridement: Fat layer exposed ?Post Procedure Diagnosis ?Same as Pre-procedure ?Electronic Signature(s) ?Signed: 03/11/2022 2:52:52 PM By: Fredirick Maudlin  MD FACS ?Signed: 03/11/2022 4:04:33 PM By: Sharyn Creamer RN, BSN ?Entered By: Sharyn Creamer on 03/11/2022 14:07:43 ?-------------------------------------------------------------------------------- ?HPI Details ?Patient Name: Date of Service: ?[place], [name] 03/11/2022 1:15 PM ?Medical Record Number: [id number] ?Patient Account Number: [id number] ?Date of Birth/Sex: Treating RN: ?1945-12-26 (76 y.o. F) [name], [name] ?Primary Care Provider: Teressa Lower Other Clinician: ?Referring Provider: ?Treating Provider/Extender: Fredirick Maudlin ?Teressa Lower ?Weeks in Treatment: 2 ?History of Present Illness ?HPI Description: ADMISSION ?02/19/2022 ?This is a 76 year old woman with minimal pertinent medical history. She does have COPD and pulmonary hypertension, but is not diabetic and is not a smoker. ?About 6 months ago, she and her husband got a new beagle puppy. The puppy scratched her leg and the scratches ultimately deteriorated into ulcerations. ?Apparently she sought care with a dermatologist who recommended applying a one-to-one mixture of peroxide and water to the wounds followed by a thick ?layer of Vaseline. She continue this for some time but then saw her primary care provider who told her to discontinue the peroxide. She has not been on any ?antibiotics for the scratches. Today, there are 3 separate wounds on her anterior tibial surface on the left. They are tender and her leg has localized swelling. ?There is yellow slough buildup in each of the wound bases. ABI in clinic today was normal at 1.16. 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 ?02/25/2022: Over the past week, the wounds themselves have not improved tremendously but she has noticed some stinging and the periwound skin is quite ?inflamed. I took a culture last week that had low levels of methicillin-resistant Staph aureus. Because it was fairly low level, I did not prescribe an oral ?antibiotic. I had planned to change her to  mupirocin today, however. We have been using Santyl under Hydrofera Blue with 3 layer compression. ?03/04/2022: The wounds have improved quite a bit over the past week. They are less painful and red. She has been [place]

## 2022-03-18 ENCOUNTER — Encounter (HOSPITAL_BASED_OUTPATIENT_CLINIC_OR_DEPARTMENT_OTHER): Payer: PPO | Admitting: General Surgery

## 2022-03-18 DIAGNOSIS — L97222 Non-pressure chronic ulcer of left calf with fat layer exposed: Secondary | ICD-10-CM | POA: Diagnosis not present

## 2022-03-25 NOTE — Progress Notes (Signed)
[name], [name] ([id number]) Visit Report for 03/18/2022 Arrival Information Details Patient Name: Date of Service: [name], [name] [name] 03/18/2022 11:15 A M Medical Record Number: [id number] Patient Account Number: [id number] Date of Birth/Sex: Treating RN: 01-Mar-1946 (76 y.o. [name] Primary Care [name]: [name] Other Clinician: Referring [name]: Treating [name]/Extender: [name] in Treatment: 3 Visit Information History Since Last Visit Added or deleted any medications: No Patient Arrived: Ambulatory Any new allergies or adverse reactions: No Arrival Time: 11:25 Had a fall or experienced change in No Accompanied By: husband activities of daily living that may affect Transfer Assistance: None risk of falls: Patient Identification Verified: Yes Signs or symptoms of abuse/neglect since last visito No Secondary Verification Process Completed: Yes Hospitalized since last visit: No Patient Requires Transmission-Based Precautions: No Implantable device outside of the clinic excluding No Patient Has Alerts: No cellular tissue based products placed in the center since last visit: Has Dressing in Place as Prescribed: Yes Has Compression in Place as Prescribed: Yes Pain Present Now: No Electronic Signature(s) Signed: 03/25/2022 4:31:14 PM By: Elaine Creamer RN, BSN Entered By: [name] on 03/18/2022 11:25:31 -------------------------------------------------------------------------------- Compression Therapy Details Patient Name: Date of Service: Elaine Condon [name] 03/18/2022 11:15 A M Medical Record Number: [id number] Patient Account Number: [id number] Date of Birth/Sex: Treating RN: Apr 13, 1946 (76 y.o. [name] Primary Care Kawon Willcutt: [name] Other Clinician: Referring Meredyth Hornung: Treating [name]/Extender: [name] in Treatment: 3 Compression Therapy Performed for Wound Assessment: Wound #1  Left,Anterior Long Leg Performed By: Clinician Elaine Creamer, RN Compression Type: Three Layer Post Procedure Diagnosis Same as Pre-procedure Electronic Signature(s) Signed: 03/25/2022 4:31:14 PM By: Elaine Creamer RN, BSN Entered By: [name] on 03/18/2022 11:52:30 -------------------------------------------------------------------------------- Encounter Discharge Information Details Patient Name: Date of Service: Elaine Condon [name] 03/18/2022 11:15 A M Medical Record Number: [id number] Patient Account Number: [id number] Date of Birth/Sex: Treating RN: 1945-12-17 (76 y.o. [name] Primary Care Yoselin Amerman: [name] Other Clinician: Referring Laketra Bowdish: Treating [name]/Extender: [name] in Treatment: 3 Encounter Discharge Information Items Post Procedure Vitals Discharge Condition: Stable Temperature (F): 97.8 Ambulatory Status: Ambulatory Pulse (bpm): 105 Discharge Destination: Home Respiratory Rate (breaths/min): 18 Transportation: Private Auto Blood Pressure (mmHg): 131/67 Accompanied By: husband Schedule Follow-up Appointment: Yes Clinical Summary of Care: Patient Declined Electronic Signature(s) Signed: 03/25/2022 4:31:14 PM By: Elaine Creamer RN, BSN Entered By: [name] on 03/18/2022 17:19:56 -------------------------------------------------------------------------------- Long Extremity Assessment Details Patient Name: Date of Service: Elaine Condon [name] 03/18/2022 11:15 A M Medical Record Number: [id number] Patient Account Number: [id number] Date of Birth/Sex: Treating RN: [birth year] (76 y.o. [name] Primary Care [name]: [name] Other Clinician: Referring [name]: Treating [name]/Extender: [name] in Treatment: 3 Edema Assessment Assessed: Shirlyn Goltz: No] [Right: No] E[Left: dema] [Right: :] Calf Left: Right: Point of Measurement: 33 cm From Medial Instep 29.2  cm Ankle Left: Right: Point of Measurement: 9 cm From Medial Instep 16.7 cm Vascular Assessment Pulses: Dorsalis Pedis Palpable: [Left:Yes] Electronic Signature(s) Signed: 03/25/2022 4:31:14 PM By: Elaine Creamer RN, BSN Entered By: [name] on 03/18/2022 11:37:49 -------------------------------------------------------------------------------- Multi Wound Chart Details Patient Name: Date of Service: Elaine Condon [name] 03/18/2022 11:15 A M Medical Record Number: [id number] Patient Account Number: [id number] Date of Birth/Sex: Treating RN: 1946/06/13 (76 y.o. [name] Primary Care [name]: [name] Other Clinician: Referring [name]: Treating [name]/Extender: [name] in Treatment: 3 Vital Signs Height(in): 61 Pulse(bpm): 105  Weight(lbs): 120 Blood Pressure(mmHg): 131/67 Body Mass Index(BMI): 22.7 Temperature(F): 97.8 Respiratory Rate(breaths/min): 18 Photos: Left, Anterior Long Leg Left, Distal, Anterior Long Leg Left, Medial Long Leg Wound Location: Trauma Trauma Trauma Wounding Event: Venous Leg Ulcer Venous Leg Ulcer Venous Leg Ulcer Primary Etiology: Cataracts, Anemia, Chronic Cataracts, Anemia, Chronic Cataracts, Anemia, Chronic Comorbid History: Obstructive Pulmonary Disease Obstructive Pulmonary Disease Obstructive Pulmonary Disease (COPD), Coronary Artery Disease, (COPD), Coronary Artery Disease, (COPD), Coronary Artery Disease, Peripheral Venous Disease, Peripheral Venous Disease, Peripheral Venous Disease, Raynauds, Rheumatoid Arthritis, Raynauds, Rheumatoid Arthritis, Raynauds, Rheumatoid Arthritis, Osteoarthritis Osteoarthritis Osteoarthritis 08/06/2021 08/06/2021 08/06/2021 Date Acquired: '3 3 3 '$ Weeks of Treatment: Open Open Open Wound Status: No No No Wound Recurrence: 1x0.6x0.1 1.3x1.2x0.1 0.6x0.3x0.1 Measurements L x W x D (cm) 0.471 1.225 0.141 A (cm) : rea 0.047 0.123 0.014 Volume (cm)  : 67.90% 2.50% 82.00% % Reduction in A rea: 68.00% 2.40% 82.30% % Reduction in Volume: Full Thickness Without Exposed Full Thickness Without Exposed Full Thickness Without Exposed Classification: Support Structures Support Structures Support Structures Medium Medium Medium Exudate A mount: Serosanguineous Serosanguineous Serosanguineous Exudate Type: red, brown red, brown red, brown Exudate Color: Flat and Intact Flat and Intact Flat and Intact Wound Margin: Large (67-100%) Medium (34-66%) Large (67-100%) Granulation A mount: Red, Pink Red Red Granulation Quality: None Present (0%) Medium (34-66%) None Present (0%) Necrotic A mount: Fat Layer (Subcutaneous Tissue): Yes Fat Layer (Subcutaneous Tissue): Yes Fat Layer (Subcutaneous Tissue): Yes Exposed Structures: Fascia: No Fascia: No Fascia: No Tendon: No Tendon: No Tendon: No Muscle: No Muscle: No Muscle: No Joint: No Joint: No Joint: No Bone: No Bone: No Bone: No Medium (34-66%) Small (1-33%) Large (67-100%) Epithelialization: Debridement - Excisional Debridement - Excisional Debridement - Excisional Debridement: Pre-procedure Verification/Time Out 11:48 11:48 11:48 Taken: Other Other Other Pain Control: Necrotic/Eschar, Subcutaneous, Necrotic/Eschar, Subcutaneous, Necrotic/Eschar, Subcutaneous, Tissue Debrided: [employer] Skin/Subcutaneous Tissue Skin/Subcutaneous Tissue Skin/Subcutaneous Tissue Level: 0.6 1.56 0.18 Debridement A (sq cm): rea Curette Curette Curette Instrument: Minimum Minimum Minimum Bleeding: Pressure Pressure Pressure Hemostasis Achieved: 0 0 0 Procedural Pain: 0 0 0 Post Procedural Pain: Procedure was tolerated well Procedure was tolerated well Procedure was tolerated well Debridement Treatment Response: Post Debridement Measurements L x 1x0.6x0.1 1.3x1.2x0.1 0.6x0.3x0.1 W x D (cm) 0.047 0.123 0.014 Post Debridement Volume: (cm) Compression Therapy Debridement  Debridement Procedures Performed: Debridement Treatment Notes Electronic Signature(s) Signed: 03/18/2022 11:53:55 AM By: Fredirick Maudlin MD FACS Signed: 03/20/2022 6:06:15 PM By: Baruch Gouty RN, BSN Entered By: Fredirick Maudlin on 03/18/2022 11:53:54 -------------------------------------------------------------------------------- Multi-Disciplinary Care Plan Details Patient Name: Date of Service: Elaine Condon [name] 03/18/2022 11:15 A M Medical Record Number: [id number] Patient Account Number: [id number] Date of Birth/Sex: Treating RN: [birth year] (76 y.o. [name] Primary Care Diamone Whistler: [name] Other Clinician: Referring Deneen Slager: Treating Clever Geraldo/Extender: [name] in Treatment: 3 Active Inactive Venous Leg Ulcer Nursing Diagnoses: Knowledge deficit related to disease process and management Potential for venous Insuffiency (use before diagnosis confirmed) Goals: Patient/caregiver will verbalize understanding of disease process and disease management Date Initiated: 02/19/2022 Target Resolution Date: 04/09/2022 Goal Status: Active Verify adequate tissue perfusion prior to therapeutic compression application Date Initiated: 02/19/2022 Target Resolution Date: 04/09/2022 Goal Status: Active Interventions: Assess peripheral edema status every visit. Compression as ordered Provide education on venous insufficiency Treatment Activities: Therapeutic compression applied : 02/19/2022 Notes: Wound/Skin Impairment Nursing Diagnoses: Impaired tissue integrity Knowledge deficit related to ulceration/compromised skin integrity Goals: Patient/caregiver will verbalize understanding of skin care regimen Date Initiated: 02/19/2022 Target  Resolution Date: 04/09/2022 Goal Status: Active Ulcer/skin breakdown will have a volume reduction of 30% by week 4 Date Initiated: 02/19/2022 Target Resolution Date: 04/09/2022 Goal Status:  Active Interventions: Assess ulceration(s) every visit Provide education on ulcer and skin care Treatment Activities: Skin care regimen initiated : 02/19/2022 Topical wound management initiated : 02/19/2022 Notes: Electronic Signature(s) Signed: 03/25/2022 4:31:14 PM By: Elaine Creamer RN, BSN Entered By: [name] on 03/18/2022 17:06:44 -------------------------------------------------------------------------------- Pain Assessment Details Patient Name: Date of Service: Elaine Condon [name] 03/18/2022 11:15 A M Medical Record Number: [id number] Patient Account Number: [id number] Date of Birth/Sex: Treating RN: 05/17/1946 (76 y.o. [name] Primary Care Tanaka Gillen: [name] Other Clinician: Referring Rodarius Kichline: Treating Dorsie Sethi/Extender: [name] in Treatment: 3 Active Problems Location of Pain Severity and Description of Pain Patient Has Paino No Site Locations Pain Management and Medication Current Pain Management: Electronic Signature(s) Signed: 03/25/2022 4:31:14 PM By: Elaine Creamer RN, BSN Entered By: [name] on 03/18/2022 11:25:05 -------------------------------------------------------------------------------- Patient/Caregiver Education Details Patient Name: Date of Service: Elaine Condon [name] 5/23/2023andnbsp11:15 A M Medical Record Number: [id number] Patient Account Number: [id number] Date of Birth/Gender: Treating RN: 1946/07/28 (76 y.o. [name] Primary Care Physician: [name] Other Clinician: Referring Physician: Treating Physician/Extender: [name] in Treatment: 3 Education Assessment Education Provided To: Patient and Caregiver Education Topics Provided Wound/Skin Impairment: Methods: Explain/Verbal Responses: State content correctly Electronic Signature(s) Signed: 03/25/2022 4:31:14 PM By: Elaine Creamer RN, BSN Entered By: [name] on 03/18/2022  17:07:01 -------------------------------------------------------------------------------- Wound Assessment Details Patient Name: Date of Service: Elaine Condon [name] 03/18/2022 11:15 A M Medical Record Number: [id number] Patient Account Number: [id number] Date of Birth/Sex: Treating RN: 06-29-1946 (76 y.o. [name] Primary Care Prabhav Faulkenberry: [name] Other Clinician: Referring Sparsh Callens: Treating Ashawnti Tangen/Extender: [name] in Treatment: 3 Wound Status Wound Number: 1 Primary Venous Leg Ulcer Etiology: Wound Location: Left, Anterior Long Leg Wound Open Wounding Event: Trauma Status: Date Acquired: 08/06/2021 Comorbid Cataracts, Anemia, Chronic Obstructive Pulmonary Disease Weeks Of Treatment: 3 History: (COPD), Coronary Artery Disease, Peripheral Venous Disease, Clustered Wound: No Raynauds, Rheumatoid Arthritis, Osteoarthritis Photos Wound Measurements Length: (cm) 1 Width: (cm) 0.6 Depth: (cm) 0.1 Area: (cm) 0.471 Volume: (cm) 0.047 % Reduction in Area: 67.9% % Reduction in Volume: 68% Epithelialization: Medium (34-66%) Tunneling: No Undermining: No Wound Description Classification: Full Thickness Without Exposed Support Structures Wound Margin: Flat and Intact Exudate Amount: Medium Exudate Type: Serosanguineous Exudate Color: red, brown Foul Odor After Cleansing: No Slough/Fibrino Yes Wound Bed Granulation Amount: Large (67-100%) Exposed Structure Granulation Quality: Red, Pink Fascia Exposed: No Necrotic Amount: None Present (0%) Fat Layer (Subcutaneous Tissue) Exposed: Yes Tendon Exposed: No Muscle Exposed: No Joint Exposed: No Bone Exposed: No Treatment Notes Wound #1 (Long Leg) Wound Laterality: Left, Anterior Cleanser Soap and Water Discharge Instruction: May shower and wash wound with dial antibacterial soap and water prior to dressing change. Wound Cleanser Discharge Instruction: Cleanse the wound with wound  cleanser prior to applying a clean dressing using gauze sponges, not tissue or cotton balls. Peri-Wound Care Triamcinolone 15 (g) Discharge Instruction: Use triamcinolone 15 (g) as directed Sween Lotion (Moisturizing lotion) Discharge Instruction: Apply moisturizing lotion as directed Topical Mupirocin Ointment Discharge Instruction: Apply Mupirocin (Bactroban) as instructed Primary Dressing Promogran Prisma Matrix, 4.34 (sq in) (silver collagen) Discharge Instruction: Moisten collagen with saline or hydrogel Secondary Dressing Woven Gauze Sponge, Non-Sterile 4x4 in Discharge Instruction: Apply over primary dressing as directed. Secured With Compression Wrap  ThreePress (3 layer compression wrap) Discharge Instruction: Apply three layer compression as directed. Compression Stockings Add-Ons Electronic Signature(s) Signed: 03/25/2022 4:31:14 PM By: Elaine Creamer RN, BSN Entered By: [name] on 03/18/2022 11:35:19 -------------------------------------------------------------------------------- Wound Assessment Details Patient Name: Date of Service: Elaine Condon [name] 03/18/2022 11:15 A M Medical Record Number: [id number] Patient Account Number: [id number] Date of Birth/Sex: Treating RN: Jul 13, 1946 (76 y.o. [name] Primary Care Byran Bilotti: [name] Other Clinician: Referring Jibril Mcminn: Treating Leveon Pelzer/Extender: [name] in Treatment: 3 Wound Status Wound Number: 2 Primary Venous Leg Ulcer Etiology: Wound Location: Left, Distal, Anterior Long Leg Wound Open Wounding Event: Trauma Status: Date Acquired: 08/06/2021 Comorbid Cataracts, Anemia, Chronic Obstructive Pulmonary Disease Weeks Of Treatment: 3 History: (COPD), Coronary Artery Disease, Peripheral Venous Disease, Clustered Wound: No Raynauds, Rheumatoid Arthritis, Osteoarthritis Photos Wound Measurements Length: (cm) 1.3 Width: (cm) 1.2 Depth: (cm) 0.1 Area: (cm)  1.225 Volume: (cm) 0.123 % Reduction in Area: 2.5% % Reduction in Volume: 2.4% Epithelialization: Small (1-33%) Tunneling: No Undermining: No Wound Description Classification: Full Thickness Without Exposed Support Structures Wound Margin: Flat and Intact Exudate Amount: Medium Exudate Type: Serosanguineous Exudate Color: red, brown Foul Odor After Cleansing: No Slough/Fibrino Yes Wound Bed Granulation Amount: Medium (34-66%) Exposed Structure Granulation Quality: Red Fascia Exposed: No Necrotic Amount: Medium (34-66%) Fat Layer (Subcutaneous Tissue) Exposed: Yes Necrotic Quality: Adherent Slough Tendon Exposed: No Muscle Exposed: No Joint Exposed: No Bone Exposed: No Treatment Notes Wound #2 (Long Leg) Wound Laterality: Left, Anterior, Distal Cleanser Soap and Water Discharge Instruction: May shower and wash wound with dial antibacterial soap and water prior to dressing change. Wound Cleanser Discharge Instruction: Cleanse the wound with wound cleanser prior to applying a clean dressing using gauze sponges, not tissue or cotton balls. Peri-Wound Care Triamcinolone 15 (g) Discharge Instruction: Use triamcinolone 15 (g) as directed Sween Lotion (Moisturizing lotion) Discharge Instruction: Apply moisturizing lotion as directed Topical Mupirocin Ointment Discharge Instruction: Apply Mupirocin (Bactroban) as instructed Primary Dressing Promogran Prisma Matrix, 4.34 (sq in) (silver collagen) Discharge Instruction: Moisten collagen with saline or hydrogel Secondary Dressing Woven Gauze Sponge, Non-Sterile 4x4 in Discharge Instruction: Apply over primary dressing as directed. Secured With Compression Wrap ThreePress (3 layer compression wrap) Discharge Instruction: Apply three layer compression as directed. Compression Stockings Add-Ons Electronic Signature(s) Signed: 03/25/2022 4:31:14 PM By: Elaine Creamer RN, BSN Entered By: [name] on 03/18/2022  11:36:05 -------------------------------------------------------------------------------- Wound Assessment Details Patient Name: Date of Service: Elaine Condon [name] 03/18/2022 11:15 A M Medical Record Number: [id number] Patient Account Number: [id number] Date of Birth/Sex: Treating RN: 03/19/1946 (76 y.o. [name] Primary Care Burris Matherne: [name] Other Clinician: Referring Marabella Popiel: Treating Asad Keeven/Extender: [name] in Treatment: 3 Wound Status Wound Number: 3 Primary Venous Leg Ulcer Etiology: Wound Location: Left, Medial Long Leg Wound Open Wounding Event: Trauma Status: Date Acquired: 08/06/2021 Comorbid Cataracts, Anemia, Chronic Obstructive Pulmonary Disease Weeks Of Treatment: 3 History: (COPD), Coronary Artery Disease, Peripheral Venous Disease, Clustered Wound: No Raynauds, Rheumatoid Arthritis, Osteoarthritis Photos Wound Measurements Length: (cm) 0.6 Width: (cm) 0.3 Depth: (cm) 0.1 Area: (cm) 0.141 Volume: (cm) 0.014 % Reduction in Area: 82% % Reduction in Volume: 82.3% Epithelialization: Large (67-100%) Tunneling: No Undermining: No Wound Description Classification: Full Thickness Without Exposed Support Structures Wound Margin: Flat and Intact Exudate Amount: Medium Exudate Type: Serosanguineous Exudate Color: red, brown Foul Odor After Cleansing: No Slough/Fibrino No Wound Bed Granulation Amount: Large (67-100%) Exposed Structure Granulation Quality: Red Fascia Exposed: No Necrotic Amount: None  Present (0%) Fat Layer (Subcutaneous Tissue) Exposed: Yes Tendon Exposed: No Muscle Exposed: No Joint Exposed: No Bone Exposed: No Treatment Notes Wound #3 (Long Leg) Wound Laterality: Left, Medial Cleanser Soap and Water Discharge Instruction: May shower and wash wound with dial antibacterial soap and water prior to dressing change. Wound Cleanser Discharge Instruction: Cleanse the wound with wound cleanser  prior to applying a clean dressing using gauze sponges, not tissue or cotton balls. Peri-Wound Care Triamcinolone 15 (g) Discharge Instruction: Use triamcinolone 15 (g) as directed Sween Lotion (Moisturizing lotion) Discharge Instruction: Apply moisturizing lotion as directed Topical Mupirocin Ointment Discharge Instruction: Apply Mupirocin (Bactroban) as instructed Primary Dressing Promogran Prisma Matrix, 4.34 (sq in) (silver collagen) Discharge Instruction: Moisten collagen with saline or hydrogel Secondary Dressing Woven Gauze Sponge, Non-Sterile 4x4 in Discharge Instruction: Apply over primary dressing as directed. Secured With Compression Wrap ThreePress (3 layer compression wrap) Discharge Instruction: Apply three layer compression as directed. Compression Stockings Add-Ons Electronic Signature(s) Signed: 03/25/2022 4:31:14 PM By: Elaine Creamer RN, BSN Entered By: [name] on 03/18/2022 11:36:44 -------------------------------------------------------------------------------- [place] Details Patient Name: Date of Service: Elaine Condon [name] 03/18/2022 11:15 A M Medical Record Number: [id number] Patient Account Number: [id number] Date of Birth/Sex: Treating RN: [birth year] (76 y.o. [name] Primary Care Prapti Grussing: [name] Other Clinician: Referring Sammye Staff: Treating Natausha Jungwirth/Extender: [name] in Treatment: 3 Vital Signs Time Taken: 11:22 Temperature (F): 97.8 Height (in): 61 Pulse (bpm): 105 Weight (lbs): 120 Respiratory Rate (breaths/min): 18 Body Mass Index (BMI): 22.7 Blood Pressure (mmHg): 131/67 Reference Range: 80 - 120 mg / dl Electronic Signature(s) Signed: 03/25/2022 4:31:14 PM By: Elaine Creamer RN, BSN Entered By: [name] on 03/18/2022 11:26:00

## 2022-03-25 NOTE — Progress Notes (Signed)
[name] ([id number]) Visit Report for 03/18/2022 Chief Complaint Document Details Patient Name: Date of Service: [name], [name] Elaine K. 03/18/2022 11:15 A M Medical Record Number: [id number] Patient Account Number: [id number] Date of Birth/Sex: Treating RN: [birth year] (76 y.o. [name] Primary Care Provider: Teressa Long Other Clinician: Referring Provider: Treating Provider/Extender: [name] in Treatment: 3 Information Obtained from: Patient Chief Complaint Patient seen for complaints of Non-Healing Wound. Electronic Signature(s) Signed: 03/18/2022 11:54:02 AM By: Elaine Maudlin MD FACS Entered By: [name] on 03/18/2022 11:54:01 -------------------------------------------------------------------------------- Debridement Details Patient Name: Date of Service: [name] Elaine K. 03/18/2022 11:15 A M Medical Record Number: [id number] Patient Account Number: [id number] Date of Birth/Sex: Treating RN: [birth year] (76 y.o. [name] Primary Care Provider: Teressa Long Other Clinician: Referring Provider: Treating Provider/Extender: [name] in Treatment: 3 Debridement Performed for Assessment: Wound #2 Left,Distal,Anterior Long Leg Performed By: Physician Elaine Maudlin, MD Debridement Type: Debridement Severity of Tissue Pre Debridement: Fat layer exposed Level of Consciousness (Pre-procedure): Awake and Alert Pre-procedure Verification/Time Out Yes - 11:48 Taken: Start Time: 11:48 Pain Control: Other : benzocaine 20% spray T Area Debrided (L x W): otal 1.3 (cm) x 1.2 (cm) = 1.56 (cm) Tissue and other material debrided: Viable, Non-Viable, Eschar, Slough, Subcutaneous, Slough Level: Skin/Subcutaneous Tissue Debridement Description: Excisional Instrument: Curette Bleeding: Minimum Hemostasis Achieved: Pressure Procedural Pain: 0 Post Procedural Pain: 0 Response to Treatment: Procedure was  tolerated well Level of Consciousness (Post- Awake and Alert procedure): Post Debridement Measurements of Total Wound Length: (cm) 1.3 Width: (cm) 1.2 Depth: (cm) 0.1 Volume: (cm) 0.123 Character of Wound/Ulcer Post Debridement: Improved Severity of Tissue Post Debridement: Fat layer exposed Post Procedure Diagnosis Same as Pre-procedure Electronic Signature(s) Signed: 03/18/2022 12:11:34 PM By: Elaine Maudlin MD FACS Signed: 03/25/2022 4:31:14 PM By: Elaine Creamer RN, BSN Entered By: [name] on 03/18/2022 11:50:42 -------------------------------------------------------------------------------- Debridement Details Patient Name: Date of Service: [name] Elaine K. 03/18/2022 11:15 A M Medical Record Number: [id number] Patient Account Number: [id number] Date of Birth/Sex: Treating RN: [name] (76 y.o. [name] Primary Care Provider: Teressa Long Other Clinician: Referring Provider: Treating Provider/Extender: [name] in Treatment: 3 Debridement Performed for Assessment: Wound #1 Left,Anterior Long Leg Performed By: Physician Elaine Maudlin, MD Debridement Type: Debridement Severity of Tissue Pre Debridement: Fat layer exposed Level of Consciousness (Pre-procedure): Awake and Alert Pre-procedure Verification/Time Out Yes - 11:48 Taken: Start Time: 11:48 Pain Control: Other : benzocaine 20% spray T Area Debrided (L x W): otal 1 (cm) x 0.6 (cm) = 0.6 (cm) Tissue and other material debrided: Viable, Non-Viable, Eschar, Slough, Subcutaneous, Slough Level: Skin/Subcutaneous Tissue Debridement Description: Excisional Instrument: Curette Bleeding: Minimum Hemostasis Achieved: Pressure Procedural Pain: 0 Post Procedural Pain: 0 Response to Treatment: Procedure was tolerated well Level of Consciousness (Post- Awake and Alert procedure): Post Debridement Measurements of Total Wound Length: (cm) 1 Width: (cm) 0.6 Depth: (cm)  0.1 Volume: (cm) 0.047 Character of Wound/Ulcer Post Debridement: Improved Severity of Tissue Post Debridement: Fat layer exposed Post Procedure Diagnosis Same as Pre-procedure Electronic Signature(s) Signed: 03/18/2022 12:11:34 PM By: Elaine Maudlin MD FACS Signed: 03/25/2022 4:31:14 PM By: Elaine Creamer RN, BSN Entered By: [name] on 03/18/2022 11:51:33 -------------------------------------------------------------------------------- Debridement Details Patient Name: Date of Service: [name] Elaine K. 03/18/2022 11:15 A M Medical Record Number: [id number] Patient Account Number: [id number] Date of Birth/Sex: Treating RN: [name] (76 y.o. [name] Primary Care Provider: Teressa Long Other Clinician: Referring Provider:  Treating Provider/Extender: [name] in Treatment: 3 Debridement Performed for Assessment: Wound #3 Left,Medial Long Leg Performed By: Physician Elaine Maudlin, MD Debridement Type: Debridement Severity of Tissue Pre Debridement: Fat layer exposed Level of Consciousness (Pre-procedure): Awake and Alert Pre-procedure Verification/Time Out Yes - 11:48 Taken: Start Time: 11:48 Pain Control: Other : benzocaine 20% spray T Area Debrided (L x W): otal 0.6 (cm) x 0.3 (cm) = 0.18 (cm) Tissue and other material debrided: Viable, Non-Viable, Eschar, Slough, Subcutaneous, Slough Level: Skin/Subcutaneous Tissue Debridement Description: Excisional Instrument: Curette Bleeding: Minimum Hemostasis Achieved: Pressure Procedural Pain: 0 Post Procedural Pain: 0 Response to Treatment: Procedure was tolerated well Level of Consciousness (Post- Awake and Alert procedure): Post Debridement Measurements of Total Wound Length: (cm) 0.6 Width: (cm) 0.3 Depth: (cm) 0.1 Volume: (cm) 0.014 Character of Wound/Ulcer Post Debridement: Improved Severity of Tissue Post Debridement: Fat layer exposed Post Procedure Diagnosis Same as  Pre-procedure Electronic Signature(s) Signed: 03/18/2022 12:11:34 PM By: Elaine Maudlin MD FACS Signed: 03/25/2022 4:31:14 PM By: Elaine Creamer RN, BSN Entered By: [name] on 03/18/2022 11:52:09 -------------------------------------------------------------------------------- HPI Details Patient Name: Date of Service: [name] Elaine K. 03/18/2022 11:15 A M Medical Record Number: [id number] Patient Account Number: [id number] Date of Birth/Sex: Treating RN: [birth year] (76 y.o. [name] Primary Care Provider: Teressa Long Other Clinician: Referring Provider: Treating Provider/Extender: [name] in Treatment: 3 History of Present Illness HPI Description: ADMISSION 02/19/2022 This is a 76 year old woman with minimal pertinent medical history. She does have COPD and pulmonary hypertension, but is not diabetic and is not a smoker. About 6 months ago, she and her husband got a new beagle puppy. The puppy scratched her leg and the scratches ultimately deteriorated into ulcerations. Apparently she sought care with a dermatologist who recommended applying a one-to-one mixture of peroxide and water to the wounds followed by a thick layer of Vaseline. She continue this for some time but then saw her primary care provider who told her to discontinue the peroxide. She has not been on any antibiotics for the scratches. Today, there are 3 separate wounds on her anterior tibial surface on the left. They are tender and her leg has localized swelling. There is yellow slough buildup in each of the wound bases. ABI in clinic today was normal at 1.16. 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 02/25/2022: Over the past week, the wounds themselves have not improved tremendously but she has noticed some stinging and the periwound skin is quite inflamed. I took a culture last week that had low levels of methicillin-resistant Staph aureus. Because it  was fairly low level, I did not prescribe an oral antibiotic. I had planned to change her to mupirocin today, however. We have been using Santyl under Hydrofera Blue with 3 layer compression. 03/04/2022: The wounds have improved quite a bit over the past week. They are less painful and red. She has been taking the prescribed doxycycline but says that it gives her a stomachache. We have been using mupirocin with Hydrofera Blue and 3 layer compression. 03/11/2022: Continued improvement of the wounds. They have contracted quite a bit and have a good surface of healthy granulation tissue present. There is some dried eschar present around all 3 of the lesions. 03/17/2022: No significant change in the wounds from last week. The surface is clean with just a little bit of eschar and slough accumulation. No odor or drainage. No concern for infection. Electronic Signature(s) Signed: 03/18/2022 11:54:35  AM By: Elaine Maudlin MD FACS Entered By: [name] on 03/18/2022 11:54:34 -------------------------------------------------------------------------------- Physical Exam Details Patient Name: Date of Service: Long, Elaine Du Elaine K. 03/18/2022 11:15 A M Medical Record Number: [id number] Patient Account Number: [id number] Date of Birth/Sex: Treating RN: [birth year] (76 y.o. [name] Primary Care Provider: Teressa Long Other Clinician: Referring Provider: Treating Provider/Extender: [name] in Treatment: 3 Constitutional . Slightly tachycardic, asymptomatic.. . . No acute distress. Respiratory Normal work of breathing on room air. Notes 03/18/2022: No significant change to the wounds over the past week. The surfaces are still clean with granulation tissue and just a little bit of slough and eschar accumulation. Electronic Signature(s) Signed: 03/18/2022 12:01:02 PM By: Elaine Maudlin MD FACS Entered By: [name] on 03/18/2022  12:01:02 -------------------------------------------------------------------------------- Physician Orders Details Patient Name: Date of Service: [name] Elaine K. 03/18/2022 11:15 A M Medical Record Number: [id number] Patient Account Number: [id number] Date of Birth/Sex: Treating RN: [birth year] (76 y.o. 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 - Dr. Celine Ahr Room 1 -Wednesday 11:15 am 03/26/22 Return A Bathing/ Shower/ Hygiene May shower with protection but do not get wound dressing(s) wet. - May purchase a cast protector from [facility] or [facility] to cover dressing Edema Control - Lymphedema / SCD / Other Elevate legs to the level of the heart or above for 30 minutes daily and/or when sitting, a frequency of: - throughout the day Avoid standing for long periods of time. Exercise regularly Wound Treatment Wound #1 - Long Leg Wound Laterality: Left, Anterior Cleanser: Soap and Water 1 x Per Week/30 Days Discharge Instructions: May shower and wash wound with dial antibacterial soap and water prior to dressing change. Cleanser: Wound Cleanser 1 x Per Week/30 Days Discharge Instructions: Cleanse the wound with wound cleanser prior to applying a clean dressing using gauze sponges, not tissue or cotton balls. Peri-Wound Care: Triamcinolone 15 (g) 1 x Per Week/30 Days Discharge Instructions: Use triamcinolone 15 (g) as directed Peri-Wound Care: Sween Lotion (Moisturizing lotion) 1 x Per Week/30 Days Discharge Instructions: Apply moisturizing lotion as directed Topical: Mupirocin Ointment 1 x Per Week/30 Days Discharge Instructions: Apply Mupirocin  (Bactroban) as instructed Prim Dressing: Promogran Prisma Matrix, 4.34 (sq in) (silver collagen) 1 x Per Week/30 Days ary Discharge Instructions: Moisten collagen with saline or hydrogel Secondary Dressing: Woven Gauze Sponge, Non-Sterile 4x4 in 1 x Per Week/30 Days Discharge Instructions: Apply over primary dressing as directed. Compression Wrap: ThreePress (3 layer compression wrap) 1 x Per Week/30 Days Discharge Instructions: Apply three layer compression as directed. Wound #2 - Long Leg Wound Laterality: Left, Anterior, Distal Cleanser: Soap and Water 1 x Per Week/30 Days Discharge Instructions: May shower and wash wound with dial antibacterial soap and water prior to dressing change. Cleanser: Wound Cleanser 1 x Per Week/30 Days Discharge Instructions: Cleanse the wound with wound cleanser prior to applying a clean dressing using gauze sponges, not tissue or cotton balls. Peri-Wound Care: Triamcinolone 15 (g) 1 x Per Week/30 Days Discharge Instructions: Use triamcinolone 15 (g) as directed Peri-Wound Care: Sween Lotion (Moisturizing lotion) 1 x Per Week/30 Days Discharge Instructions: Apply moisturizing lotion as directed Topical: Mupirocin Ointment 1 x Per Week/30 Days Discharge Instructions: Apply Mupirocin (Bactroban)  as instructed Prim Dressing: Promogran Prisma Matrix, 4.34 (sq in) (silver collagen) 1 x Per Week/30 Days ary Discharge Instructions: Moisten collagen with saline or hydrogel Secondary Dressing: Woven Gauze Sponge, Non-Sterile 4x4 in 1 x Per Week/30 Days Discharge Instructions: Apply over primary dressing as directed. Compression Wrap: ThreePress (3 layer compression wrap) 1 x Per Week/30 Days Discharge Instructions: Apply three layer compression as directed. Wound #3 - Long Leg Wound Laterality: Left, Medial Cleanser: Soap and Water 1 x Per Week/30 Days Discharge Instructions: May shower and wash wound with dial antibacterial soap and water prior to dressing  change. Cleanser: Wound Cleanser 1 x Per Week/30 Days Discharge Instructions: Cleanse the wound with wound cleanser prior to applying a clean dressing using gauze sponges, not tissue or cotton balls. Peri-Wound Care: Triamcinolone 15 (g) 1 x Per Week/30 Days Discharge Instructions: Use triamcinolone 15 (g) as directed Peri-Wound Care: Sween Lotion (Moisturizing lotion) 1 x Per Week/30 Days Discharge Instructions: Apply moisturizing lotion as directed Topical: Mupirocin Ointment 1 x Per Week/30 Days Discharge Instructions: Apply Mupirocin (Bactroban) as instructed Prim Dressing: Promogran Prisma Matrix, 4.34 (sq in) (silver collagen) 1 x Per Week/30 Days ary Discharge Instructions: Moisten collagen with saline or hydrogel Secondary Dressing: Woven Gauze Sponge, Non-Sterile 4x4 in 1 x Per Week/30 Days Discharge Instructions: Apply over primary dressing as directed. Compression Wrap: ThreePress (3 layer compression wrap) 1 x Per Week/30 Days Discharge Instructions: Apply three layer compression as directed. Electronic Signature(s) Signed: 03/18/2022 12:11:34 PM By: Elaine Maudlin MD FACS Entered By: [name] on 03/18/2022 12:01:19 -------------------------------------------------------------------------------- Problem List Details Patient Name: Date of Service: [name] Elaine K. 03/18/2022 11:15 A M Medical Record Number: [id number] Patient Account Number: [id number] Date of Birth/Sex: Treating RN: [birth year] Long (76 y.o. 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 03/18/2022 11:15 A M Medical Record Number: [id number] Patient Account Number: [id number] Date of Birth/Sex: Treating RN: [birth year] Long (76 y.o. [name] Primary Care Provider: Teressa Long Other Clinician: Referring Provider: Treating Provider/Extender: [name] in Treatment: 3 Subjective Chief Complaint Information obtained from Patient Patient seen for complaints of Non-Healing Wound. History of Present Illness (HPI) ADMISSION 02/19/2022 This is a 76 year old woman with minimal pertinent medical history. She does have COPD and pulmonary hypertension, but is not diabetic and is not a smoker. About 6 months ago, she and her husband got a new beagle puppy. The puppy scratched her leg and the scratches ultimately deteriorated into ulcerations. Apparently she sought care with a dermatologist who recommended applying a one-to-one mixture of peroxide and water to the wounds followed by a thick layer of Vaseline. She continue this for some time but then saw her primary care provider who told her to discontinue the peroxide. She has not been on any antibiotics for the scratches. Today, there are 3 separate wounds on her anterior tibial surface on the left. They are tender and her leg has localized swelling. There is yellow slough buildup in each of the wound bases. ABI in clinic today was normal at 1.16. She  does have some venous varicosities but no significant swelling. 02/25/2022: Over the past week, the wounds themselves have not improved tremendously but she has noticed some stinging and the periwound skin is quite inflamed. I took a culture last week that had low levels of methicillin-resistant Staph aureus. Because it was fairly low  level, I did not prescribe an oral antibiotic. I had planned to change her to mupirocin today, however. We have been using Santyl under Hydrofera Blue with 3 layer compression. 03/04/2022: The wounds have improved quite a bit over the past week. They are less painful and red. She has been taking the prescribed doxycycline but says that it gives her a stomachache. We have been using mupirocin with Hydrofera Blue and 3 layer compression. 03/11/2022: Continued improvement of the wounds. They have contracted quite a bit and have a good surface of healthy granulation tissue present. There is some dried eschar present around all 3 of the lesions. 03/17/2022: No significant change in the wounds from last week. The surface is clean with just a little bit of eschar and slough accumulation. No odor or drainage. No concern for infection. Patient History Information obtained from Patient, Caregiver. Family History Unknown History. Social History Former smoker, Marital Status - Married, Alcohol Use - Never, Drug Use - No History, Caffeine Use - Rarely. Medical History Eyes Patient has history of Cataracts - bil removed Denies history of Glaucoma, Optic Neuritis Ear/Nose/Mouth/Throat Denies history of Chronic sinus problems/congestion, Middle ear problems Hematologic/Lymphatic Patient has history of Anemia Respiratory Patient has history of Chronic Obstructive Pulmonary Disease (COPD) Cardiovascular Patient has history of Coronary Artery Disease, Peripheral Venous Disease - varicose veins Endocrine Denies history of Type I Diabetes, Type II Diabetes Genitourinary Denies history of End Stage Renal Disease Immunological Patient has history of Raynaudoos Denies history of Lupus Erythematosus, Scleroderma Integumentary (Skin) Denies history of History of Burn Musculoskeletal Patient has history of Rheumatoid Arthritis, Osteoarthritis Denies history of Gout Oncologic Denies history of Received  Chemotherapy, Received Radiation Psychiatric Denies history of Anorexia/bulimia, Confinement Anxiety Hospitalization/Surgery History - bil cataract removal. - left shoiulder replacement. - right rotator cuff repair. - multiple lumbar spine surgeries. - melanoma removed left arm and chest. - partial excision bone left 2nd toe. Medical A Surgical History Notes nd Hematologic/Lymphatic varicose vein of leg, mixed hyperlipidemia Respiratory dyspnea, chronic bronchitis, pulmonary hypertension, pulmonary emphysema Cardiovascular aortic calcification, tachycardia, left ventricular dysfunction, bradycardia Gastrointestinal Gerd Endocrine prediabetes, hypothyroidism Genitourinary bladder disorder Integumentary (Skin) contact dermatitis Musculoskeletal acquired plantar porokeratosis, neurogenic claudication d/t lumbar spinal stenosis, supraspinatus tendinitis, restless leg syndrome, spinal stenosis of lumbar region Neurologic TIA, insomnia Objective Constitutional Slightly tachycardic, asymptomatic.Marland Kitchen No acute distress. Vitals Time Taken: 11:22 AM, Height: 61 in, Weight: 120 lbs, BMI: 22.7, Temperature: 97.8 F, Pulse: 105 bpm, Respiratory Rate: 18 breaths/min, Blood Pressure: 131/67 mmHg. Respiratory Normal work of breathing on room air. General Notes: 03/18/2022: No significant change to the wounds over the past week. The surfaces are still clean with granulation tissue and just a little bit of slough and eschar accumulation. Integumentary (Hair, Skin) Wound #1 status is Open. Original cause of wound was Trauma. The date acquired was: 08/06/2021. The wound has been in treatment 3 weeks. The wound is located on the Left,Anterior Long Leg. The wound measures 1cm length x 0.6cm width x 0.1cm depth; 0.471cm^2 area and 0.047cm^3 volume. There is Fat Layer (Subcutaneous Tissue) exposed. There is no tunneling or undermining noted. There is a medium amount of serosanguineous drainage noted. The  wound  margin is flat and intact. There is large (67-100%) red, pink granulation within the wound bed. There is no necrotic tissue within the wound bed. Wound #2 status is Open. Original cause of wound was Trauma. The date acquired was: 08/06/2021. The wound has been in treatment 3 weeks. The wound is located on the [facility] Long Leg. The wound measures 1.3cm length x 1.2cm width x 0.1cm depth; 1.225cm^2 area and 0.123cm^3 volume. There is Fat Layer (Subcutaneous Tissue) exposed. There is no tunneling or undermining noted. There is a medium amount of serosanguineous drainage noted. The wound margin is flat and intact. There is medium (34-66%) red granulation within the wound bed. There is a medium (34-66%) amount of necrotic tissue within the wound bed including Adherent Slough. Wound #3 status is Open. Original cause of wound was Trauma. The date acquired was: 08/06/2021. The wound has been in treatment 3 weeks. The wound is located on the Left,Medial Long Leg. The wound measures 0.6cm length x 0.3cm width x 0.1cm depth; 0.141cm^2 area and 0.014cm^3 volume. There is Fat Layer (Subcutaneous Tissue) exposed. There is no tunneling or undermining noted. There is a medium amount of serosanguineous drainage noted. The wound margin is flat and intact. There is large (67-100%) red granulation within the wound bed. There is no necrotic tissue within the wound bed. 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 There was a Excisional Skin/Subcutaneous Tissue Debridement with a total area of 0.6 sq cm performed by Elaine Maudlin, MD. With the following instrument(s): Curette to remove Viable and Non-Viable tissue/material. Material removed  includes Eschar, Subcutaneous Tissue, and Slough after achieving pain control using Other (benzocaine 20% spray). No specimens were taken. A time out was conducted at 11:48, prior to the start of the procedure. A Minimum amount of bleeding was controlled with Pressure. The procedure was tolerated well with a pain level of 0 throughout and a pain level of 0 following the procedure. Post Debridement Measurements: 1cm length x 0.6cm width x 0.1cm depth; 0.047cm^3 volume. Character of Wound/Ulcer Post Debridement is improved. Severity of Tissue Post Debridement is: Fat layer exposed. Post procedure Diagnosis Wound #1: Same as Pre-Procedure Pre-procedure diagnosis of Wound #1 is a Venous Leg Ulcer located on the Left,Anterior Long Leg . There was a Three Layer Compression Therapy Procedure by Elaine Creamer, RN. Post procedure Diagnosis Wound #1: Same as Pre-Procedure Wound #2 Pre-procedure diagnosis of Wound #2 is a Venous Leg Ulcer located on the Left,Distal,Anterior Long Leg .Severity of Tissue Pre Debridement is: Fat layer exposed. There was a Excisional Skin/Subcutaneous Tissue Debridement with a total area of 1.56 sq cm performed by Elaine Maudlin, MD. With the following instrument(s): Curette to remove Viable and Non-Viable tissue/material. Material removed includes Eschar, Subcutaneous Tissue, and Slough after achieving pain control using Other (benzocaine 20% spray). No specimens were taken. A time out was conducted at 11:48, prior to the start of the procedure. A Minimum amount of bleeding was controlled with Pressure. The procedure was tolerated well with a pain level of 0 throughout and a pain level of 0 following the procedure. Post Debridement Measurements: 1.3cm length x 1.2cm width x 0.1cm depth; 0.123cm^3 volume. Character of Wound/Ulcer Post Debridement is improved. Severity of Tissue Post Debridement is: Fat layer exposed. 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 There was a Excisional Skin/Subcutaneous Tissue Debridement with a total area of 0.18 sq cm performed by Elaine Maudlin, MD. With the following instrument(s): Curette to remove Viable and Non-Viable tissue/material. Material removed includes Eschar, Subcutaneous Tissue, and Slough after achieving pain control using Other (benzocaine 20% spray). No specimens were taken. A time out was conducted at 11:48, prior to the start of the procedure. A Minimum amount of bleeding was controlled with Pressure. The procedure was tolerated well with a pain level of 0 throughout and a pain level of 0 following the procedure. Post Debridement Measurements: 0.6cm length x 0.3cm width x 0.1cm depth; 0.014cm^3 volume. Character of Wound/Ulcer Post Debridement is improved. Severity of Tissue Post Debridement is: Fat layer exposed. Post procedure Diagnosis Wound #3: Same as Pre-Procedure Plan Follow-up Appointments: Return Appointment in 1 week. - Dr. Celine Ahr Room 1 -Wednesday 11:15 am 03/26/22 Bathing/ Shower/ Hygiene: May shower with protection but do not get wound dressing(s) wet. - May purchase a cast protector from [place] or [facility] to cover dressing Edema Control - Lymphedema / SCD / Other: Elevate legs to the level of the heart or above for 30 minutes daily and/or when sitting, a frequency of: - throughout the day Avoid standing for long periods of time. Exercise regularly WOUND #1: - Long Leg Wound Laterality: Left, Anterior Cleanser: Soap and Water 1 x Per Week/30 Days Discharge Instructions: May shower and wash wound with dial antibacterial soap and water prior to dressing change. Cleanser: Wound Cleanser 1 x Per Week/30 Days Discharge Instructions: Cleanse the wound with wound cleanser prior to applying a clean dressing using  gauze sponges, not tissue or cotton balls. Peri-Wound Care: Triamcinolone 15 (g) 1 x Per Week/30 Days Discharge Instructions: Use triamcinolone 15 (g) as directed Peri-Wound Care: Sween Lotion (Moisturizing lotion) 1 x Per Week/30 Days Discharge Instructions: Apply moisturizing lotion as directed Topical: Mupirocin Ointment 1 x Per Week/30 Days Discharge Instructions: Apply Mupirocin (Bactroban) as instructed Prim Dressing: Promogran Prisma Matrix, 4.34 (sq in) (silver collagen) 1 x Per Week/30 Days ary Discharge Instructions: Moisten collagen with saline or hydrogel Secondary Dressing: Woven Gauze Sponge, Non-Sterile 4x4 in 1 x Per Week/30 Days Discharge Instructions: Apply over primary dressing as directed. Com pression Wrap: ThreePress (3 layer compression wrap) 1 x Per Week/30 Days Discharge Instructions: Apply three layer compression as directed. WOUND #2: - Long Leg Wound Laterality: Left, Anterior, Distal Cleanser: Soap and Water 1 x Per Week/30 Days Discharge Instructions: May shower and wash wound with dial antibacterial soap and water prior to dressing change. Cleanser: Wound Cleanser 1 x Per Week/30 Days Discharge Instructions: Cleanse the wound with wound cleanser prior to applying a clean dressing using gauze sponges, not tissue or cotton balls. Peri-Wound Care: Triamcinolone 15 (g) 1 x Per Week/30 Days Discharge Instructions: Use triamcinolone 15 (g) as directed Peri-Wound Care: Sween Lotion (Moisturizing lotion) 1 x Per Week/30 Days Discharge Instructions: Apply moisturizing lotion as directed Topical: Mupirocin Ointment 1 x Per Week/30 Days Discharge Instructions: Apply Mupirocin (Bactroban) as instructed Prim Dressing: Promogran Prisma Matrix, 4.34 (sq in) (silver collagen) 1 x Per Week/30 Days ary Discharge Instructions: Moisten collagen with saline or hydrogel Secondary Dressing: Woven Gauze Sponge, Non-Sterile 4x4 in 1 x Per Week/30 Days Discharge Instructions: Apply  over primary dressing as directed. Com pression Wrap:  ThreePress (3 layer compression wrap) 1 x Per Week/30 Days Discharge Instructions: Apply three layer compression as directed. WOUND #3: - Long Leg Wound Laterality: Left, Medial Cleanser: Soap and Water 1 x Per Week/30 Days Discharge Instructions: May shower and wash wound with dial antibacterial soap and water prior to dressing change. Cleanser: Wound Cleanser 1 x Per Week/30 Days Discharge Instructions: Cleanse the wound with wound cleanser prior to applying a clean dressing using gauze sponges, not tissue or cotton balls. Peri-Wound Care: Triamcinolone 15 (g) 1 x Per Week/30 Days Discharge Instructions: Use triamcinolone 15 (g) as directed Peri-Wound Care: Sween Lotion (Moisturizing lotion) 1 x Per Week/30 Days Discharge Instructions: Apply moisturizing lotion as directed Topical: Mupirocin Ointment 1 x Per Week/30 Days Discharge Instructions: Apply Mupirocin (Bactroban) as instructed Prim Dressing: Promogran Prisma Matrix, 4.34 (sq in) (silver collagen) 1 x Per Week/30 Days ary Discharge Instructions: Moisten collagen with saline or hydrogel Secondary Dressing: Woven Gauze Sponge, Non-Sterile 4x4 in 1 x Per Week/30 Days Discharge Instructions: Apply over primary dressing as directed. Com pression Wrap: ThreePress (3 layer compression wrap) 1 x Per Week/30 Days Discharge Instructions: Apply three layer compression as directed. 03/18/2022: No significant change to the wounds over the past week. The surfaces are still clean with granulation tissue and just a little bit of slough and eschar accumulation. I used a curette to debride eschar and slough from the wounds. Given that we have used Hydrofera Blue for the last month without the degree of improvement that I would like to see, I am going to change her dressing to Prisma silver collagen. We will continue using the topical mupirocin and 3 layer compression. Follow-up in 1  week. Electronic Signature(s) Signed: 03/18/2022 12:02:03 PM By: Elaine Maudlin MD FACS Entered By: [name] on 03/18/2022 12:02:02 -------------------------------------------------------------------------------- HxROS Details Patient Name: Date of Service: [name] Elaine K. 03/18/2022 11:15 A M Medical Record Number: [id number] Patient Account Number: [id number] Date of Birth/Sex: Treating RN: [birth year] (76 y.o. [name] Primary Care Provider: Teressa Long Other Clinician: Referring Provider: Treating Provider/Extender: [name] in Treatment: 3 Information Obtained From Patient Caregiver Eyes Medical History: Positive for: Cataracts - bil removed Negative for: Glaucoma; Optic Neuritis Ear/Nose/Mouth/Throat Medical History: Negative for: Chronic sinus problems/congestion; Middle ear problems Hematologic/Lymphatic Medical History: Positive for: Anemia Past Medical History Notes: varicose vein of leg, mixed hyperlipidemia Respiratory Medical History: Positive for: Chronic Obstructive Pulmonary Disease (COPD) Past Medical History Notes: dyspnea, chronic bronchitis, pulmonary hypertension, pulmonary emphysema Cardiovascular Medical History: Positive for: Coronary Artery Disease; Peripheral Venous Disease - varicose veins Past Medical History Notes: aortic calcification, tachycardia, left ventricular dysfunction, bradycardia Gastrointestinal Medical History: Past Medical History Notes: Gerd Endocrine Medical History: Negative for: Type I Diabetes; Type II Diabetes Past Medical History Notes: prediabetes, hypothyroidism Genitourinary Medical History: Negative for: End Stage Renal Disease Past Medical History Notes: bladder disorder Immunological Medical History: Positive for: Raynauds Negative for: Lupus Erythematosus; Scleroderma Integumentary (Skin) Medical History: Negative for: History of Burn Past Medical  History Notes: contact dermatitis Musculoskeletal Medical History: Positive for: Rheumatoid Arthritis; Osteoarthritis Negative for: Gout Past Medical History Notes: acquired plantar porokeratosis, neurogenic claudication d/t lumbar spinal stenosis, supraspinatus tendinitis, restless leg syndrome, spinal stenosis of lumbar region Neurologic Medical History: Past Medical History Notes: TIA, insomnia Oncologic Medical History: Negative for: Received Chemotherapy; Received Radiation Psychiatric Medical History: Negative for: Anorexia/bulimia; Confinement Anxiety HBO Extended History Items Eyes: Cataracts Immunizations Pneumococcal Vaccine: Received Pneumococcal Vaccination: Yes Received Pneumococcal Vaccination On or After  60th Birthday: Yes Implantable Devices None Hospitalization / Surgery History Type of Hospitalization/Surgery bil cataract removal left shoiulder replacement right rotator cuff repair multiple lumbar spine surgeries melanoma removed left arm and chest partial excision bone left 2nd toe Family and Social History Unknown History: Yes; Former smoker; Marital Status - Married; Alcohol Use: Never; Drug Use: No History; Caffeine Use: Rarely; Financial Concerns: No; Food, Clothing or Shelter Needs: No; Support System Lacking: No; Transportation Concerns: No Engineer, maintenance) Signed: 03/18/2022 12:11:34 PM By: Elaine Maudlin MD FACS Signed: 03/20/2022 6:06:15 PM By: Baruch Gouty RN, BSN Entered By: [name] on 03/18/2022 11:59:58 -------------------------------------------------------------------------------- [place] Details Patient Name: Date of Service: [name] Elaine K. 03/18/2022 Medical Record Number: [id number] Patient Account Number: [id number] Date of Birth/Sex: Treating RN: [birth year] (76 y.o. 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

## 2022-03-26 ENCOUNTER — Encounter (HOSPITAL_BASED_OUTPATIENT_CLINIC_OR_DEPARTMENT_OTHER): Payer: PPO | Admitting: General Surgery

## 2022-03-31 NOTE — Progress Notes (Signed)
ERLEAN, MEALOR ([id number]) Visit Report for 03/26/2022 Chief Complaint Document Details Patient Name: Date of Service: CATRENA, VARI [name] 03/26/2022 11:15 A M Medical Record Number: [id number] Patient Account Number: [id number] Date of Birth/Sex: Treating RN: 04-03-1946 (76 y.o. [name] Primary Care Provider: Teressa Lower Other Clinician: Referring Provider: Treating Provider/Extender: Jodelle Gross in Treatment: 5 Information Obtained from: Patient Chief Complaint Patient seen for complaints of Non-Healing Wound. Electronic Signature(s) Signed: 03/26/2022 11:53:13 AM By: Fredirick Maudlin MD FACS Entered By: Fredirick Maudlin on 03/26/2022 11:53:12 -------------------------------------------------------------------------------- HPI Details Patient Name: Date of Service: [name] [name] 03/26/2022 11:15 A M Medical Record Number: [id number] Patient Account Number: [id number] Date of Birth/Sex: Treating RN: [birth year] (76 y.o. [name] Primary Care Provider: Teressa Lower Other Clinician: Referring Provider: Treating Provider/Extender: Jodelle Gross in Treatment: 5 History of Present Illness HPI Description: ADMISSION 02/19/2022 This is a 76 year old woman with minimal pertinent medical history. She does have COPD and pulmonary hypertension, but is not diabetic and is not a smoker. About 6 months ago, she and her husband got a new beagle puppy. The puppy scratched her leg and the scratches ultimately deteriorated into ulcerations. Apparently she sought care with a dermatologist who recommended applying a one-to-one mixture of peroxide and water to the wounds followed by a thick layer of Vaseline. She continue this for some time but then saw her primary care provider who told her to discontinue the peroxide. She has not been on any antibiotics for the scratches. Today, there are 3 separate wounds on her anterior tibial surface  on the left. They are tender and her leg has localized swelling. There is yellow slough buildup in each of the wound bases. ABI in clinic today was normal at 1.16. 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 02/25/2022: Over the past week, the wounds themselves have not improved tremendously but she has noticed some stinging and the periwound skin is quite inflamed. I took a culture last week that had low levels of methicillin-resistant Staph aureus. Because it was fairly low level, I did not prescribe an oral antibiotic. I had planned to change her to mupirocin today, however. We have been using Santyl under Hydrofera Blue with 3 layer compression. 03/04/2022: The wounds have improved quite a bit over the past week. They are less painful and red. She has been taking the prescribed doxycycline but says that it gives her a stomachache. We have been using mupirocin with Hydrofera Blue and 3 layer compression. 03/11/2022: Continued improvement of the wounds. They have contracted quite a bit and have a good surface of healthy granulation tissue present. There is some dried eschar present around all 3 of the lesions. 03/17/2022: [name] significant change in the wounds from last week. The surface is clean with just a little bit of eschar and slough accumulation. [name] odor or drainage. [name] concern for infection. 03/26/2022: All of her wounds are smaller this week. There is good granulation tissue on the surface of each. [name] slough accumulation. There is just some dry skin around the wounds but not actually involving the wounds. [name] concern for infection. Electronic Signature(s) Signed: 03/26/2022 11:53:51 AM By: Fredirick Maudlin MD FACS Signed: 03/26/2022 11:53:51 AM By: Fredirick Maudlin MD FACS Entered By: Fredirick Maudlin on 03/26/2022 11:53:51 -------------------------------------------------------------------------------- Physical Exam Details Patient Name: Date of Service: [name] [name]  03/26/2022 11:15 A M Medical Record Number: [id number] Patient Account Number: [id number] Date of Birth/Sex: Treating  RN: [birth year] (76 y.o. [name] Primary Care Provider: Teressa Lower Other Clinician: Referring Provider: Treating Provider/Extender: Jodelle Gross in Treatment: 5 Constitutional . Slightly tachycardic, asymptomatic.. . . [name] acute distress. Respiratory . Notes 03/26/2022: All of her wounds are smaller this week. There is good granulation tissue on the surface of each. [name] slough accumulation. There is just some dry skin around the wounds but not actually involving the wounds. Electronic Signature(s) Signed: 03/26/2022 11:57:56 AM By: Fredirick Maudlin MD FACS Entered By: Fredirick Maudlin on 03/26/2022 11:57:56 -------------------------------------------------------------------------------- Physician Orders Details Patient Name: Date of Service: [name] [name] 03/26/2022 11:15 A M Medical Record Number: [id number] Patient Account Number: [id number] Date of Birth/Sex: Treating RN: 1946/07/10 (76 y.o. 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 - Dr. Celine Ahr Room 1 - Thursday 1:15 am 04/03/22 Return A Bathing/ Shower/ Hygiene May shower with protection but do not get wound dressing(s) wet. - May purchase a cast protector from [place] or [facility] to cover dressing Edema Control - Lymphedema / SCD / Other Elevate legs to the level of the heart or above for 30 minutes daily and/or when sitting, a frequency of: - throughout the day Avoid standing for long periods of time. Exercise regularly Wound  Treatment Wound #1 - Lower Leg Wound Laterality: Left, Anterior Cleanser: Soap and Water 1 x Per Week/30 Days Discharge Instructions: May shower and wash wound with dial antibacterial soap and water prior to dressing change. Cleanser: Wound Cleanser 1 x Per Week/30 Days Discharge Instructions: Cleanse the wound with wound cleanser prior to applying a clean dressing using gauze sponges, not tissue or cotton balls. Peri-Wound Care: Triamcinolone 15 (g) 1 x Per Week/30 Days Discharge Instructions: Use triamcinolone 15 (g) as directed Peri-Wound Care: Sween Lotion (Moisturizing lotion) 1 x Per Week/30 Days Discharge Instructions: Apply moisturizing lotion as directed Topical: Mupirocin Ointment 1 x Per Week/30 Days Discharge Instructions: Apply Mupirocin (Bactroban) as instructed Prim Dressing: Promogran Prisma Matrix, 4.34 (sq in) (silver collagen) 1 x Per Week/30 Days ary Discharge Instructions: Moisten collagen with saline or hydrogel Secondary Dressing: Woven Gauze Sponge, Non-Sterile 4x4 in 1 x Per Week/30 Days Discharge Instructions: Apply over primary dressing as directed. Compression Wrap: ThreePress (3 layer compression wrap) 1 x Per Week/30 Days Discharge Instructions: Apply three layer compression as directed. Wound #2 - Lower Leg Wound Laterality: Left, Anterior, Distal Cleanser: Soap and Water 1 x Per Week/30 Days Discharge Instructions: May shower and wash wound with dial antibacterial soap and water prior to dressing change. Cleanser: Wound Cleanser 1 x Per Week/30 Days Discharge Instructions: Cleanse the wound with wound cleanser prior to applying a clean dressing using gauze sponges, not tissue or cotton balls. Peri-Wound Care: Triamcinolone 15 (g) 1 x Per Week/30 Days Discharge Instructions: Use triamcinolone 15 (g) as directed Peri-Wound Care: Sween Lotion (Moisturizing lotion) 1 x Per Week/30 Days Discharge Instructions: Apply moisturizing lotion as directed Topical:  Mupirocin Ointment 1 x Per Week/30 Days Discharge Instructions: Apply Mupirocin (Bactroban) as instructed Prim Dressing: Promogran Prisma Matrix, 4.34 (sq in) (silver collagen) 1 x Per Week/30 Days ary Discharge Instructions: Moisten collagen with saline or hydrogel Secondary Dressing: Woven Gauze Sponge, Non-Sterile 4x4 in 1 x Per Week/30 Days  Discharge Instructions: Apply over primary dressing as directed. Compression Wrap: ThreePress (3 layer compression wrap) 1 x Per Week/30 Days Discharge Instructions: Apply three layer compression as directed. Wound #3 - Lower Leg Wound Laterality: Left, Medial Cleanser: Soap and Water 1 x Per Week/30 Days Discharge Instructions: May shower and wash wound with dial antibacterial soap and water prior to dressing change. Cleanser: Wound Cleanser 1 x Per Week/30 Days Discharge Instructions: Cleanse the wound with wound cleanser prior to applying a clean dressing using gauze sponges, not tissue or cotton balls. Peri-Wound Care: Triamcinolone 15 (g) 1 x Per Week/30 Days Discharge Instructions: Use triamcinolone 15 (g) as directed Peri-Wound Care: Sween Lotion (Moisturizing lotion) 1 x Per Week/30 Days Discharge Instructions: Apply moisturizing lotion as directed Topical: Mupirocin Ointment 1 x Per Week/30 Days Discharge Instructions: Apply Mupirocin (Bactroban) as instructed Prim Dressing: Promogran Prisma Matrix, 4.34 (sq in) (silver collagen) 1 x Per Week/30 Days ary Discharge Instructions: Moisten collagen with saline or hydrogel Secondary Dressing: Woven Gauze Sponge, Non-Sterile 4x4 in 1 x Per Week/30 Days Discharge Instructions: Apply over primary dressing as directed. Compression Wrap: ThreePress (3 layer compression wrap) 1 x Per Week/30 Days Discharge Instructions: Apply three layer compression as directed. Electronic Signature(s) Signed: 03/26/2022 12:04:28 PM By: Fredirick Maudlin MD FACS Entered By: Fredirick Maudlin on 03/26/2022  11:58:13 -------------------------------------------------------------------------------- Problem List Details Patient Name: Date of Service: [name] [name] 03/26/2022 11:15 A M Medical Record Number: [id number] Patient Account Number: [id number] Date of Birth/Sex: Treating RN: 06-Aug-1946 (76 y.o. 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 03/26/2022 11:15 A M Medical Record Number: [id number] Patient Account Number: [id number] Date of Birth/Sex: Treating RN: 06-Aug-1946 (76 y.o. [name] Primary Care Provider: Teressa Lower Other Clinician: Referring Provider: Treating Provider/Extender: Jodelle Gross in Treatment: 5 Subjective Chief Complaint Information obtained from Patient Patient seen for complaints of Non-Healing Wound. History of Present Illness (HPI) ADMISSION 02/19/2022 This is a 76 year old woman with minimal pertinent medical history. She does have COPD and pulmonary hypertension, but is not diabetic and is not a smoker. About 6 months ago, she and her husband got a new beagle puppy. The puppy scratched her leg and the scratches ultimately deteriorated into ulcerations. Apparently she sought care with a dermatologist who recommended applying a one-to-one mixture of peroxide and water to the wounds  followed by a thick layer of Vaseline. She continue this for some time but then saw her primary care provider who told her to discontinue the peroxide. She has not been on any antibiotics for the scratches. Today, there are 3 separate wounds on her anterior tibial surface on the left. They are tender and her leg has localized swelling. There is yellow slough buildup in each of the wound bases. ABI in clinic today was normal at 1.16. 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 02/25/2022: Over the past week, the wounds themselves have not improved tremendously but she has noticed some stinging and the periwound skin is quite inflamed. I took a culture last  week that had low levels of methicillin-resistant Staph aureus. Because it was fairly low level, I did not prescribe an oral antibiotic. I had planned to change her to mupirocin today, however. We have been using Santyl under Hydrofera Blue with 3 layer compression. 03/04/2022: The wounds have improved quite a bit over the past week. They are less painful and red. She has been taking the prescribed doxycycline but says that it gives her a stomachache. We have been using mupirocin with Hydrofera Blue and 3 layer compression. 03/11/2022: Continued improvement of the wounds. They have contracted quite a bit and have a good surface of healthy granulation tissue present. There is some dried eschar present around all 3 of the lesions. 03/17/2022: [name] significant change in the wounds from last week. The surface is clean with just a little bit of eschar and slough accumulation. [name] odor or drainage. [name] concern for infection. 03/26/2022: All of her wounds are smaller this week. There is good granulation tissue on the surface of each. [name] slough accumulation. There is just some dry skin around the wounds but not actually involving the wounds. [name] concern for infection. Patient History Information obtained from Patient, Caregiver. Family  History Unknown History. Social History Former smoker, Marital Status - Married, Alcohol Use - Never, Drug Use - [name] History, Caffeine Use - Rarely. Medical History Eyes Patient has history of Cataracts - bil removed Denies history of Glaucoma, Optic Neuritis Ear/Nose/Mouth/Throat Denies history of Chronic sinus problems/congestion, Middle ear problems Hematologic/Lymphatic Patient has history of Anemia Respiratory Patient has history of Chronic Obstructive Pulmonary Disease (COPD) Cardiovascular Patient has history of Coronary Artery Disease, Peripheral Venous Disease - varicose veins Endocrine Denies history of Type I Diabetes, Type II Diabetes Genitourinary Denies history of End Stage Renal Disease Immunological Patient has history of Raynaudoos Denies history of Lupus Erythematosus, Scleroderma Integumentary (Skin) Denies history of History of Burn Musculoskeletal Patient has history of Rheumatoid Arthritis, Osteoarthritis Denies history of Gout Oncologic Denies history of Received Chemotherapy, Received Radiation Psychiatric Denies history of Anorexia/bulimia, Confinement Anxiety Hospitalization/Surgery History - bil cataract removal. - left shoiulder replacement. - right rotator cuff repair. - multiple lumbar spine surgeries. - melanoma removed left arm and chest. - partial excision bone left 2nd toe. Medical A Surgical History Notes nd Hematologic/Lymphatic varicose vein of leg, mixed hyperlipidemia Respiratory dyspnea, chronic bronchitis, pulmonary hypertension, pulmonary emphysema Cardiovascular aortic calcification, tachycardia, left ventricular dysfunction, bradycardia Gastrointestinal Gerd Endocrine prediabetes, hypothyroidism Genitourinary bladder disorder Integumentary (Skin) contact dermatitis Musculoskeletal acquired plantar porokeratosis, neurogenic claudication d/t lumbar spinal stenosis, supraspinatus tendinitis, restless leg syndrome, spinal  stenosis of lumbar region Neurologic TIA, insomnia Objective Constitutional Slightly tachycardic, asymptomatic.Marland Kitchen [name] acute distress. Vitals Time Taken: 11:07 AM, Height: 61 in, Weight: 120 lbs, BMI: 22.7, Temperature: 98.5 F, Pulse: 106 bpm, Respiratory Rate: 18 breaths/min, Blood Pressure: 135/67 mmHg. General Notes: 03/26/2022: All of her wounds are smaller this week. There is good granulation tissue on the surface of each. [name] slough accumulation. There is just some dry skin around the wounds but not actually involving the wounds. Integumentary (Hair, Skin) Wound #1 status is Open. Original cause of wound was Trauma. The date acquired was: 08/06/2021. The wound has been in treatment 5 weeks. The wound is located on the Left,Anterior Lower Leg. The wound measures 0.3cm length x 0.3cm width x 0.1cm depth; 0.071cm^2 area and 0.007cm^3 volume. There is Fat Layer (Subcutaneous Tissue) exposed. There is [name] tunneling or undermining noted. There is a medium amount of serosanguineous drainage noted. The  wound margin is flat and intact. There is large (67-100%) red, pink granulation within the wound bed. There is [name] necrotic tissue within the wound bed. Wound #2 status is Open. Original cause of wound was Trauma. The date acquired was: 08/06/2021. The wound has been in treatment 5 weeks. The wound is located on the [facility] Lower Leg. The wound measures 0.8cm length x 1.2cm width x 0.1cm depth; 0.754cm^2 area and 0.075cm^3 volume. There is Fat Layer (Subcutaneous Tissue) exposed. There is [name] tunneling or undermining noted. There is a medium amount of serosanguineous drainage noted. The wound margin is flat and intact. There is medium (34-66%) red, hyper - granulation within the wound bed. There is a medium (34-66%) amount of necrotic tissue within the wound bed including Adherent Slough. Wound #3 status is Open. Original cause of wound was Trauma. The date acquired was: 08/06/2021. The wound  has been in treatment 5 weeks. The wound is located on the Left,Medial Lower Leg. The wound measures 0.2cm length x 0.2cm width x 0.1cm depth; 0.031cm^2 area and 0.003cm^3 volume. There is Fat Layer (Subcutaneous Tissue) exposed. There is [name] tunneling or undermining noted. There is a medium amount of serosanguineous drainage noted. The wound margin is flat and intact. There is large (67-100%) red granulation within the wound bed. There is [name] necrotic tissue within the wound bed. 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 There was a Three Layer Compression Therapy Procedure by Sharyn Creamer, RN. Post procedure Diagnosis Wound #1: Same as Pre-Procedure Wound #2 Pre-procedure diagnosis of Wound #2 is a Venous Leg Ulcer located on the Left,Distal,Anterior Lower Leg . There was a Three Layer Compression Therapy Procedure by Sharyn Creamer, RN. Post procedure Diagnosis Wound #2: Same as Pre-Procedure Wound #3 Pre-procedure diagnosis of Wound #3 is a Venous Leg Ulcer located on the Left,Medial Lower Leg . There was a Three Layer Compression Therapy Procedure by Sharyn Creamer, RN. Post procedure Diagnosis Wound #3: Same as Pre-Procedure Plan Follow-up Appointments: Return Appointment in 1 week. - Dr. Celine Ahr Room 1 - Thursday 1:15 am 04/03/22 Bathing/ Shower/ Hygiene: May shower with protection but do not get wound dressing(s) wet. - May purchase a cast protector from [place] or [facility] to cover dressing Edema Control - Lymphedema / SCD / Other: Elevate legs to the level of the heart or above for 30 minutes daily and/or when sitting, a frequency of: - throughout the day Avoid standing for long periods of time. Exercise regularly WOUND #1: - Lower Leg Wound Laterality: Left, Anterior Cleanser: Soap and  Water 1 x Per Week/30 Days Discharge Instructions: May shower and wash wound with dial antibacterial soap and water prior to dressing change. Cleanser: Wound Cleanser 1 x Per Week/30 Days Discharge Instructions: Cleanse the wound with wound cleanser prior to applying a clean dressing using gauze sponges, not tissue or cotton balls. Peri-Wound Care: Triamcinolone 15 (g) 1 x Per Week/30 Days Discharge Instructions: Use triamcinolone 15 (g) as directed Peri-Wound Care: Sween Lotion (Moisturizing lotion) 1 x Per Week/30 Days Discharge Instructions: Apply moisturizing lotion as directed Topical: Mupirocin Ointment 1 x Per Week/30 Days Discharge Instructions: Apply Mupirocin (Bactroban) as instructed Prim Dressing: Promogran Prisma Matrix, 4.34 (sq in) (silver collagen) 1 x Per Week/30 Days ary Discharge Instructions: Moisten collagen with saline or hydrogel Secondary Dressing: Woven Gauze Sponge,  Non-Sterile 4x4 in 1 x Per Week/30 Days Discharge Instructions: Apply over primary dressing as directed. Com pression Wrap: ThreePress (3 layer compression wrap) 1 x Per Week/30 Days Discharge Instructions: Apply three layer compression as directed. WOUND #2: - Lower Leg Wound Laterality: Left, Anterior, Distal Cleanser: Soap and Water 1 x Per Week/30 Days Discharge Instructions: May shower and wash wound with dial antibacterial soap and water prior to dressing change. Cleanser: Wound Cleanser 1 x Per Week/30 Days Discharge Instructions: Cleanse the wound with wound cleanser prior to applying a clean dressing using gauze sponges, not tissue or cotton balls. Peri-Wound Care: Triamcinolone 15 (g) 1 x Per Week/30 Days Discharge Instructions: Use triamcinolone 15 (g) as directed Peri-Wound Care: Sween Lotion (Moisturizing lotion) 1 x Per Week/30 Days Discharge Instructions: Apply moisturizing lotion as directed Topical: Mupirocin Ointment 1 x Per Week/30 Days Discharge Instructions: Apply Mupirocin  (Bactroban) as instructed Prim Dressing: Promogran Prisma Matrix, 4.34 (sq in) (silver collagen) 1 x Per Week/30 Days ary Discharge Instructions: Moisten collagen with saline or hydrogel Secondary Dressing: Woven Gauze Sponge, Non-Sterile 4x4 in 1 x Per Week/30 Days Discharge Instructions: Apply over primary dressing as directed. Com pression Wrap: ThreePress (3 layer compression wrap) 1 x Per Week/30 Days Discharge Instructions: Apply three layer compression as directed. WOUND #3: - Lower Leg Wound Laterality: Left, Medial Cleanser: Soap and Water 1 x Per Week/30 Days Discharge Instructions: May shower and wash wound with dial antibacterial soap and water prior to dressing change. Cleanser: Wound Cleanser 1 x Per Week/30 Days Discharge Instructions: Cleanse the wound with wound cleanser prior to applying a clean dressing using gauze sponges, not tissue or cotton balls. Peri-Wound Care: Triamcinolone 15 (g) 1 x Per Week/30 Days Discharge Instructions: Use triamcinolone 15 (g) as directed Peri-Wound Care: Sween Lotion (Moisturizing lotion) 1 x Per Week/30 Days Discharge Instructions: Apply moisturizing lotion as directed Topical: Mupirocin Ointment 1 x Per Week/30 Days Discharge Instructions: Apply Mupirocin (Bactroban) as instructed Prim Dressing: Promogran Prisma Matrix, 4.34 (sq in) (silver collagen) 1 x Per Week/30 Days ary Discharge Instructions: Moisten collagen with saline or hydrogel Secondary Dressing: Woven Gauze Sponge, Non-Sterile 4x4 in 1 x Per Week/30 Days Discharge Instructions: Apply over primary dressing as directed. Com pression Wrap: ThreePress (3 layer compression wrap) 1 x Per Week/30 Days Discharge Instructions: Apply three layer compression as directed. 03/26/2022: All of her wounds are smaller this week. There is good granulation tissue on the surface of each. [name] slough accumulation. There is just some dry skin around the wounds but not actually involving the  wounds. [name] wound debridement was required today. I did remove some of the dry skin from around the wounds. We will continue using mupirocin with Prisma silver collagen and 3 layer compression. Follow-up in 1 week. Electronic Signature(s) Signed: 03/26/2022 11:59:00 AM By: Fredirick Maudlin MD FACS Entered By: Fredirick Maudlin on 03/26/2022 11:59:00 -------------------------------------------------------------------------------- HxROS Details Patient Name: Date of Service: [name], [name] [name] 03/26/2022 11:15 A M Medical Record Number: [id number] Patient Account Number: [id number] Date of Birth/Sex: Treating RN: 1945/11/28 (76 y.o. [name] Primary Care Provider: Teressa Lower Other Clinician: Referring Provider: Treating Provider/Extender: Jodelle Gross in Treatment: 5 Information Obtained From Patient Caregiver Eyes Medical History: Positive for: Cataracts - bil removed Negative for: Glaucoma; Optic Neuritis Ear/Nose/Mouth/Throat Medical History: Negative for: Chronic sinus problems/congestion; Middle ear problems Hematologic/Lymphatic Medical History: Positive for: Anemia Past Medical History Notes: varicose vein of leg, mixed hyperlipidemia Respiratory Medical History: Positive for: Chronic  Obstructive Pulmonary Disease (COPD) Past Medical History Notes: dyspnea, chronic bronchitis, pulmonary hypertension, pulmonary emphysema Cardiovascular Medical History: Positive for: Coronary Artery Disease; Peripheral Venous Disease - varicose veins Past Medical History Notes: aortic calcification, tachycardia, left ventricular dysfunction, bradycardia Gastrointestinal Medical History: Past Medical History Notes: Gerd Endocrine Medical History: Negative for: Type I Diabetes; Type II Diabetes Past Medical History Notes: prediabetes, hypothyroidism Genitourinary Medical History: Negative for: End Stage Renal Disease Past Medical History  Notes: bladder disorder Immunological Medical History: Positive for: Raynauds Negative for: Lupus Erythematosus; Scleroderma Integumentary (Skin) Medical History: Negative for: History of Burn Past Medical History Notes: contact dermatitis Musculoskeletal Medical History: Positive for: Rheumatoid Arthritis; Osteoarthritis Negative for: Gout Past Medical History Notes: acquired plantar porokeratosis, neurogenic claudication d/t lumbar spinal stenosis, supraspinatus tendinitis, restless leg syndrome, spinal stenosis of lumbar region Neurologic Medical History: Past Medical History Notes: TIA, insomnia Oncologic Medical History: Negative for: Received Chemotherapy; Received Radiation Psychiatric Medical History: Negative for: Anorexia/bulimia; Confinement Anxiety HBO Extended History Items Eyes: Cataracts Immunizations Pneumococcal Vaccine: Received Pneumococcal Vaccination: Yes Received Pneumococcal Vaccination On or After 60th Birthday: Yes Implantable Devices None Hospitalization / Surgery History Type of Hospitalization/Surgery bil cataract removal left shoiulder replacement right rotator cuff repair multiple lumbar spine surgeries melanoma removed left arm and chest partial excision bone left 2nd toe Family and Social History Unknown History: Yes; Former smoker; Marital Status - Married; Alcohol Use: Never; Drug Use: [name] History; Caffeine Use: Rarely; Financial Concerns: [name]; Food, Clothing or Shelter Needs: [name]; Support System Lacking: [name]; Transportation Concerns: [name] Engineer, maintenance) Signed: 03/26/2022 12:04:28 PM By: Fredirick Maudlin MD FACS Signed: 03/31/2022 5:13:49 PM By: Baruch Gouty RN, BSN Entered By: Fredirick Maudlin on 03/26/2022 11:53:59 -------------------------------------------------------------------------------- SuperBill Details Patient Name: Date of Service: [name] [name] 03/26/2022 Medical Record Number: [id number] Patient Account  Number: [id number] Date of Birth/Sex: Treating RN: 01-25-1946 (76 y.o. 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

## 2022-03-31 NOTE — Progress Notes (Signed)
[name], [name] ([id number]) Visit Report for 03/26/2022 Arrival Information Details Patient Name: Date of Service: Elaine, Long [name] 03/26/2022 11:15 A M Medical Record Number: [id number] Patient Account Number: [id number] Date of Birth/Sex: Treating RN: 03-May-1946 (76 y.o. [name], [name] Primary Care Penda Venturi: Teressa Lower Other Clinician: Referring Tanyla Stege: Treating Samaj Wessells/Extender: Jodelle Gross in Treatment: 5 Visit Information History Since Last Visit Added or deleted any medications: No Patient Arrived: Ambulatory Any new allergies or adverse reactions: No Arrival Time: 11:07 Had a fall or experienced change in No Accompanied By: husband activities of daily living that may affect Transfer Assistance: None risk of falls: Patient Identification Verified: Yes Signs or symptoms of abuse/neglect since last visito No Secondary Verification Process Completed: Yes Hospitalized since last visit: No Patient Requires Transmission-Based Precautions: No Implantable device outside of the clinic excluding No Patient Has Alerts: No cellular tissue based products placed in the center since last visit: Has Dressing in Place as Prescribed: Yes Pain Present Now: No Electronic Signature(s) Signed: 03/26/2022 3:27:54 PM By: Sandre Kitty Entered By: Sandre Kitty on 03/26/2022 11:08:00 -------------------------------------------------------------------------------- Compression Therapy Details Patient Name: Date of Service: [name] [name] 03/26/2022 11:15 A M Medical Record Number: [id number] Patient Account Number: [id number] Date of Birth/Sex: Treating RN: 1946/08/15 (76 y.o. [name] Primary Care Jeremiyah Cullens: Teressa Lower Other Clinician: Referring Trent Gabler: Treating Galileah Piggee/Extender: Jodelle Gross in Treatment: 5 Compression Therapy Performed for Wound Assessment: Wound #1 Left,Anterior Lower Leg Performed By: Clinician  Sharyn Creamer, RN Compression Type: Three Layer Post Procedure Diagnosis Same as Pre-procedure Electronic Signature(s) Signed: 03/27/2022 5:59:26 PM By: Sharyn Creamer RN, BSN Entered By: Sharyn Creamer on 03/26/2022 11:33:42 -------------------------------------------------------------------------------- Compression Therapy Details Patient Name: Date of Service: [name] [name] 03/26/2022 11:15 A M Medical Record Number: [id number] Patient Account Number: [id number] Date of Birth/Sex: Treating RN: Feb 14, 1946 (76 y.o. [name] Primary Care Vandora Jaskulski: Teressa Lower Other Clinician: Referring Shilynn Hoch: Treating Burlin Mcnair/Extender: Jodelle Gross in Treatment: 5 Compression Therapy Performed for Wound Assessment: Wound #2 Left,Distal,Anterior Lower Leg Performed By: Clinician Sharyn Creamer, RN Compression Type: Three Layer Post Procedure Diagnosis Same as Pre-procedure Electronic Signature(s) Signed: 03/27/2022 5:59:26 PM By: Sharyn Creamer RN, BSN Entered By: Sharyn Creamer on 03/26/2022 11:33:42 -------------------------------------------------------------------------------- Compression Therapy Details Patient Name: Date of Service: [name] [name] 03/26/2022 11:15 A M Medical Record Number: [id number] Patient Account Number: [id number] Date of Birth/Sex: Treating RN: [birth year] (76 y.o. [name] Primary Care Akon Reinoso: Teressa Lower Other Clinician: Referring Pearlene Teat: Treating Maly Lemarr/Extender: Jodelle Gross in Treatment: 5 Compression Therapy Performed for Wound Assessment: Wound #3 Left,Medial Lower Leg Performed By: Clinician Sharyn Creamer, RN Compression Type: Three Layer Post Procedure Diagnosis Same as Pre-procedure Electronic Signature(s) Signed: 03/27/2022 5:59:26 PM By: Sharyn Creamer RN, BSN Entered By: Sharyn Creamer on 03/26/2022  11:33:42 -------------------------------------------------------------------------------- Lower Extremity Assessment Details Patient Name: Date of Service: [name] [name] 03/26/2022 11:15 A M Medical Record Number: [id number] Patient Account Number: [id number] Date of Birth/Sex: Treating RN: [birth year] (76 y.o. [name] Primary Care Ariday Brinker: Teressa Lower Other Clinician: Referring Dierre Crevier: Treating Shaniah Baltes/Extender: Jodelle Gross in Treatment: 5 Edema Assessment Assessed: Shirlyn Goltz: No] [Right: No] E[Left: dema] [Right: :] Calf Left: Right: Point of Measurement: 33 cm From Medial Instep 29.4 cm Ankle Left: Right: Point of Measurement: 9 cm From Medial Instep 16.9 cm Vascular Assessment Pulses: Dorsalis Pedis Palpable: [Left:Yes] Electronic Signature(s) Signed: 03/27/2022 5:59:26 PM By: Sharyn Creamer  RN, BSN Entered By: Sharyn Creamer on 03/26/2022 11:26:10 -------------------------------------------------------------------------------- Multi Wound Chart Details Patient Name: Date of Service: DARCEL, ZICK [name] 03/26/2022 11:15 A M Medical Record Number: [id number] Patient Account Number: [id number] Date of Birth/Sex: Treating RN: 1946/05/11 (76 y.o. [name] Primary Care Mounir Skipper: Teressa Lower Other Clinician: Referring Desirey Keahey: Treating Kasch Borquez/Extender: Jodelle Gross in Treatment: 5 Vital Signs Height(in): 61 Pulse(bpm): 106 Weight(lbs): 120 Blood Pressure(mmHg): 135/67 Body Mass Index(BMI): 22.7 Temperature(F): 98.5 Respiratory Rate(breaths/min): 18 Photos: Left, Anterior Lower Leg Left, Distal, Anterior Lower Leg Left, Medial Lower Leg Wound Location: Trauma Trauma Trauma Wounding Event: Venous Leg Ulcer Venous Leg Ulcer Venous Leg Ulcer Primary Etiology: Cataracts, Anemia, Chronic Cataracts, Anemia, Chronic Cataracts, Anemia, Chronic Comorbid History: Obstructive Pulmonary Disease  Obstructive Pulmonary Disease Obstructive Pulmonary Disease (COPD), Coronary Artery Disease, (COPD), Coronary Artery Disease, (COPD), Coronary Artery Disease, Peripheral Venous Disease, Peripheral Venous Disease, Peripheral Venous Disease, Raynauds, Rheumatoid Arthritis, Raynauds, Rheumatoid Arthritis, Raynauds, Rheumatoid Arthritis, Osteoarthritis Osteoarthritis Osteoarthritis 08/06/2021 08/06/2021 08/06/2021 Date Acquired: '5 5 5 '$ Weeks of Treatment: Open Open Open Wound Status: No No No Wound Recurrence: 0.3x0.3x0.1 0.8x1.2x0.1 0.2x0.2x0.1 Measurements L x W x D (cm) 0.071 0.754 0.031 A (cm) : rea 0.007 0.075 0.003 Volume (cm) : 95.20% 40.00% 96.10% % Reduction in Area: 95.20% 40.50% 96.20% % Reduction in Volume: Full Thickness Without Exposed Full Thickness Without Exposed Full Thickness Without Exposed Classification: Support Structures Support Structures Support Structures Medium Medium Medium Exudate Amount: Serosanguineous Serosanguineous Serosanguineous Exudate Type: red, brown red, brown red, brown Exudate Color: Flat and Intact Flat and Intact Flat and Intact Wound Margin: Large (67-100%) Medium (34-66%) Large (67-100%) Granulation Amount: Red, Pink Red, Hyper-granulation Red Granulation Quality: None Present (0%) Medium (34-66%) None Present (0%) Necrotic Amount: Fat Layer (Subcutaneous Tissue): Yes Fat Layer (Subcutaneous Tissue): Yes Fat Layer (Subcutaneous Tissue): Yes Exposed Structures: Fascia: No Fascia: No Fascia: No Tendon: No Tendon: No Tendon: No Muscle: No Muscle: No Muscle: No Joint: No Joint: No Joint: No Bone: No Bone: No Bone: No Medium (34-66%) Medium (34-66%) Large (67-100%) Epithelialization: Compression Therapy Compression Therapy Compression Therapy Procedures Performed: Treatment Notes Electronic Signature(s) Signed: 03/26/2022 11:53:07 AM By: Fredirick Maudlin MD FACS Signed: 03/31/2022 5:13:49 PM By: Baruch Gouty RN,  BSN Entered By: Fredirick Maudlin on 03/26/2022 11:53:07 -------------------------------------------------------------------------------- Multi-Disciplinary Care Plan Details Patient Name: Date of Service: [name] [name] 03/26/2022 11:15 A M Medical Record Number: [id number] Patient Account Number: [id number] Date of Birth/Sex: Treating RN: 1945-11-13 (76 y.o. [name] Primary Care Salomon Ganser: Teressa Lower Other Clinician: Referring Sanaz Scarlett: Treating Genesee Nase/Extender: Jodelle Gross in Treatment: 5 Active Inactive Venous Leg Ulcer Nursing Diagnoses: Knowledge deficit related to disease process and management Potential for venous Insuffiency (use before diagnosis confirmed) Goals: Patient/caregiver will verbalize understanding of disease process and disease management Date Initiated: 02/19/2022 Target Resolution Date: 04/09/2022 Goal Status: Active Verify adequate tissue perfusion prior to therapeutic compression application Date Initiated: 02/19/2022 Target Resolution Date: 04/09/2022 Goal Status: Active Interventions: Assess peripheral edema status every visit. Compression as ordered Provide education on venous insufficiency Treatment Activities: Therapeutic compression applied : 02/19/2022 Notes: Wound/Skin Impairment Nursing Diagnoses: Impaired tissue integrity Knowledge deficit related to ulceration/compromised skin integrity Goals: Patient/caregiver will verbalize understanding of skin care regimen Date Initiated: 02/19/2022 Target Resolution Date: 04/09/2022 Goal Status: Active Ulcer/skin breakdown will have a volume reduction of 30% by week 4 Date Initiated: 02/19/2022 Target Resolution Date: 04/09/2022 Goal Status: Active Interventions: Assess ulceration(s) every visit Provide education on ulcer  and skin care Treatment Activities: Skin care regimen initiated : 02/19/2022 Topical wound management initiated :  02/19/2022 Notes: Electronic Signature(s) Signed: 03/27/2022 5:59:26 PM By: Sharyn Creamer RN, BSN Entered By: Sharyn Creamer on 03/26/2022 11:29:52 -------------------------------------------------------------------------------- Pain Assessment Details Patient Name: Date of Service: [name] [name] 03/26/2022 11:15 A M Medical Record Number: [id number] Patient Account Number: [id number] Date of Birth/Sex: Treating RN: 12-22-1945 (76 y.o. [name] Primary Care Oddis Westling: Teressa Lower Other Clinician: Referring Caydn Justen: Treating Mercia Dowe/Extender: Jodelle Gross in Treatment: 5 Active Problems Location of Pain Severity and Description of Pain Patient Has Paino No Site Locations Pain Management and Medication Current Pain Management: Electronic Signature(s) Signed: 03/26/2022 3:27:54 PM By: Sandre Kitty Signed: 03/31/2022 5:13:49 PM By: Baruch Gouty RN, BSN Entered By: Sandre Kitty on 03/26/2022 11:08:24 -------------------------------------------------------------------------------- Patient/Caregiver Education Details Patient Name: Date of Service: [name] [name] 5/31/2023andnbsp11:15 A M Medical Record Number: [id number] Patient Account Number: [id number] Date of Birth/Gender: Treating RN: 08-11-1946 (76 y.o. [name] Primary Care Physician: Teressa Lower Other Clinician: Referring Physician: Treating Physician/Extender: Jodelle Gross in Treatment: 5 Education Assessment Education Provided To: Patient Education Topics Provided Nutrition: Methods: Explain/Verbal Responses: State content correctly Wound/Skin Impairment: Methods: Explain/Verbal Responses: State content correctly Electronic Signature(s) Signed: 03/27/2022 5:59:26 PM By: Sharyn Creamer RN, BSN Entered By: Sharyn Creamer on 03/26/2022 11:30:15 -------------------------------------------------------------------------------- Wound  Assessment Details Patient Name: Date of Service: [name] [name] 03/26/2022 11:15 A M Medical Record Number: [id number] Patient Account Number: [id number] Date of Birth/Sex: Treating RN: 1946/05/23 (76 y.o. [name] Primary Care Trevone Prestwood: Teressa Lower Other Clinician: Referring Kaspar Albornoz: Treating Kemya Shed/Extender: Jodelle Gross in Treatment: 5 Wound Status Wound Number: 1 Primary Venous Leg Ulcer Etiology: Wound Location: Left, Anterior Lower Leg Wound Open Wounding Event: Trauma Status: Date Acquired: 08/06/2021 Comorbid Cataracts, Anemia, Chronic Obstructive Pulmonary Disease Weeks Of Treatment: 5 History: (COPD), Coronary Artery Disease, Peripheral Venous Disease, Clustered Wound: No Raynauds, Rheumatoid Arthritis, Osteoarthritis Photos Wound Measurements Length: (cm) 0.3 Width: (cm) 0.3 Depth: (cm) 0.1 Area: (cm) 0.071 Volume: (cm) 0.007 % Reduction in Area: 95.2% % Reduction in Volume: 95.2% Epithelialization: Medium (34-66%) Tunneling: No Undermining: No Wound Description Classification: Full Thickness Without Exposed Support Structures Wound Margin: Flat and Intact Exudate Amount: Medium Exudate Type: Serosanguineous Exudate Color: red, brown Foul Odor After Cleansing: No Slough/Fibrino Yes Wound Bed Granulation Amount: Large (67-100%) Exposed Structure Granulation Quality: Red, Pink Fascia Exposed: No Necrotic Amount: None Present (0%) Fat Layer (Subcutaneous Tissue) Exposed: Yes Tendon Exposed: No Muscle Exposed: No Joint Exposed: No Bone Exposed: No Electronic Signature(s) Signed: 03/27/2022 5:59:26 PM By: Sharyn Creamer RN, BSN Signed: 03/31/2022 5:13:49 PM By: Baruch Gouty RN, BSN Entered By: Sharyn Creamer on 03/26/2022 11:27:31 -------------------------------------------------------------------------------- Wound Assessment Details Patient Name: Date of Service: [name] [name] 03/26/2022 11:15 A M Medical  Record Number: [id number] Patient Account Number: [id number] Date of Birth/Sex: Treating RN: [birth year] (76 y.o. [name] Primary Care Teriyah Purington: Teressa Lower Other Clinician: Referring Trevian Hayashida: Treating Carlyn Lemke/Extender: Jodelle Gross in Treatment: 5 Wound Status Wound Number: 2 Primary Venous Leg Ulcer Etiology: Wound Location: Left, Distal, Anterior Lower Leg Wound Open Wounding Event: Trauma Status: Date Acquired: 08/06/2021 Comorbid Cataracts, Anemia, Chronic Obstructive Pulmonary Disease Weeks Of Treatment: 5 History: (COPD), Coronary Artery Disease, Peripheral Venous Disease, Clustered Wound: No Raynauds, Rheumatoid Arthritis, Osteoarthritis Photos Wound Measurements Length: (cm) 0.8 Width: (cm) 1.2 Depth: (cm) 0.1 Area: (cm) 0.754 Volume: (cm) 0.075 %  Reduction in Area: 40% % Reduction in Volume: 40.5% Epithelialization: Medium (34-66%) Tunneling: No Undermining: No Wound Description Classification: Full Thickness Without Exposed Support Structures Wound Margin: Flat and Intact Exudate Amount: Medium Exudate Type: Serosanguineous Exudate Color: red, brown Foul Odor After Cleansing: No Slough/Fibrino Yes Wound Bed Granulation Amount: Medium (34-66%) Exposed Structure Granulation Quality: Red, Hyper-granulation Fascia Exposed: No Necrotic Amount: Medium (34-66%) Fat Layer (Subcutaneous Tissue) Exposed: Yes Necrotic Quality: Adherent Slough Tendon Exposed: No Muscle Exposed: No Joint Exposed: No Bone Exposed: No Electronic Signature(s) Signed: 03/27/2022 5:59:26 PM By: Sharyn Creamer RN, BSN Signed: 03/31/2022 5:13:49 PM By: Baruch Gouty RN, BSN Entered By: Sharyn Creamer on 03/26/2022 11:29:09 -------------------------------------------------------------------------------- Wound Assessment Details Patient Name: Date of Service: [name] [name] 03/26/2022 11:15 A M Medical Record Number: [id number] Patient Account  Number: [id number] Date of Birth/Sex: Treating RN: [birth year] (76 y.o. [name] Primary Care Lilla Callejo: Teressa Lower Other Clinician: Referring Nikita Humble: Treating Haris Baack/Extender: Jodelle Gross in Treatment: 5 Wound Status Wound Number: 3 Primary Venous Leg Ulcer Etiology: Wound Location: Left, Medial Lower Leg Wound Open Wounding Event: Trauma Status: Date Acquired: 08/06/2021 Comorbid Cataracts, Anemia, Chronic Obstructive Pulmonary Disease Weeks Of Treatment: 5 History: (COPD), Coronary Artery Disease, Peripheral Venous Disease, Clustered Wound: No Raynauds, Rheumatoid Arthritis, Osteoarthritis Photos Wound Measurements Length: (cm) 0.2 Width: (cm) 0.2 Depth: (cm) 0.1 Area: (cm) 0.031 Volume: (cm) 0.003 % Reduction in Area: 96.1% % Reduction in Volume: 96.2% Epithelialization: Large (67-100%) Tunneling: No Undermining: No Wound Description Classification: Full Thickness Without Exposed Support Structures Wound Margin: Flat and Intact Exudate Amount: Medium Exudate Type: Serosanguineous Exudate Color: red, brown Foul Odor After Cleansing: No Slough/Fibrino No Wound Bed Granulation Amount: Large (67-100%) Exposed Structure Granulation Quality: Red Fascia Exposed: No Necrotic Amount: None Present (0%) Fat Layer (Subcutaneous Tissue) Exposed: Yes Tendon Exposed: No Muscle Exposed: No Joint Exposed: No Bone Exposed: No Electronic Signature(s) Signed: 03/27/2022 5:59:26 PM By: Sharyn Creamer RN, BSN Signed: 03/31/2022 5:13:49 PM By: Baruch Gouty RN, BSN Entered By: Sharyn Creamer on 03/26/2022 11:29:27 -------------------------------------------------------------------------------- [place] Details Patient Name: Date of Service: [name] [name] 03/26/2022 11:15 A M Medical Record Number: [id number] Patient Account Number: [id number] Date of Birth/Sex: Treating RN: 04-26-1946 (76 y.o. [name] Primary Care Bernadett Milian:  Teressa Lower Other Clinician: Referring Aloysuis Ribaudo: Treating Dinesha Twiggs/Extender: Jodelle Gross in Treatment: 5 Vital Signs Time Taken: 11:07 Temperature (F): 98.5 Height (in): 61 Pulse (bpm): 106 Weight (lbs): 120 Respiratory Rate (breaths/min): 18 Body Mass Index (BMI): 22.7 Blood Pressure (mmHg): 135/67 Reference Range: 80 - 120 mg / dl Electronic Signature(s) Signed: 03/26/2022 3:27:54 PM By: Sandre Kitty Entered By: Sandre Kitty on 03/26/2022 11:08:18

## 2022-04-03 ENCOUNTER — Encounter (HOSPITAL_BASED_OUTPATIENT_CLINIC_OR_DEPARTMENT_OTHER): Payer: PPO | Attending: General Surgery | Admitting: General Surgery

## 2022-04-03 DIAGNOSIS — I272 Pulmonary hypertension, unspecified: Secondary | ICD-10-CM | POA: Insufficient documentation

## 2022-04-03 DIAGNOSIS — L97922 Non-pressure chronic ulcer of unspecified part of left lower leg with fat layer exposed: Secondary | ICD-10-CM | POA: Diagnosis present

## 2022-04-03 DIAGNOSIS — J449 Chronic obstructive pulmonary disease, unspecified: Secondary | ICD-10-CM | POA: Insufficient documentation

## 2022-04-03 NOTE — Progress Notes (Signed)
[name] ([id number]) Visit Report for 04/03/2022 Arrival Information Details Patient Name: Date of Service: [name], [name] [name] 04/03/2022 1:15 PM Medical Record Number: [id number] Patient Account Number: [id number] Date of Birth/Sex: Treating RN: Jun 14, 1946 (76 y.o. [name], [name] [name]: [name] Other Clinician: Referring [name]: Treating [name]/Extender: [name] in Treatment: 6 Visit Information History Since Last Visit Added or deleted any medications: No Patient Arrived: Ambulatory Any new allergies or adverse reactions: No Arrival Time: 13:41 Had a fall or experienced change in No Accompanied By: spouse activities of daily living that may affect Transfer Assistance: None risk of falls: Patient Identification Verified: Yes Signs or symptoms of abuse/neglect since last visito No Secondary Verification Process Completed: Yes Hospitalized since last visit: No Patient Requires Transmission-Based Precautions: No Implantable device outside of the clinic excluding No Patient Has Alerts: No cellular tissue based products placed in the center since last visit: Has Dressing in Place as Prescribed: Yes Has Compression in Place as Prescribed: Yes Pain Present Now: No Electronic Signature(s) Signed: 04/03/2022 4:51:47 PM By: Baruch Gouty RN, BSN Entered By: Baruch Gouty on 04/03/2022 13:46:22 -------------------------------------------------------------------------------- Compression Therapy Details Patient Name: Date of Service: [name] [name] 04/03/2022 1:15 PM Medical Record Number: [id number] Patient Account Number: [id number] Date of Birth/Sex: Treating RN: 04-30-1946 (76 y.o. [name] Primary Long [name]: [name] Other Clinician: Referring [name]: Treating [name]/Extender: [name] in Treatment: 6 Compression Therapy Performed for Wound Assessment: Wound #2  Left,Distal,Anterior Long Leg Performed By: Clinician Baruch Gouty, RN Compression Type: Three Layer Post Procedure Diagnosis Same as Pre-procedure Electronic Signature(s) Signed: 04/03/2022 4:51:47 PM By: Baruch Gouty RN, BSN Entered By: Baruch Gouty on 04/03/2022 14:05:35 -------------------------------------------------------------------------------- Encounter Discharge Information Details Patient Name: Date of Service: [name] [name] 04/03/2022 1:15 PM Medical Record Number: [id number] Patient Account Number: [id number] Date of Birth/Sex: Treating RN: January 09, 1946 (76 y.o. [name] Primary Long Padraic Marinos: [name] Other Clinician: Referring [name]: Treating [name]/Extender: [name] in Treatment: 6 Encounter Discharge Information Items Post Procedure Vitals Discharge Condition: Stable Temperature (F): 98.4 Ambulatory Status: Ambulatory Pulse (bpm): 99 Discharge Destination: Home Respiratory Rate (breaths/min): 18 Transportation: Private Auto Blood Pressure (mmHg): 128/62 Accompanied By: spouse Schedule Follow-up Appointment: Yes Clinical Summary of Long: Patient Declined Electronic Signature(s) Signed: 04/03/2022 4:51:47 PM By: Baruch Gouty RN, BSN Entered By: Baruch Gouty on 04/03/2022 14:20:59 -------------------------------------------------------------------------------- Long Extremity Assessment Details Patient Name: Date of Service: [name] [name] 04/03/2022 1:15 PM Medical Record Number: [id number] Patient Account Number: [id number] Date of Birth/Sex: Treating RN: [birth year] (76 y.o. [name] Primary Long Kaidance Pantoja: [name] Other Clinician: Referring [name]: Treating [name]/Extender: [name] in Treatment: 6 Edema Assessment Assessed: [name]: No] [Right: No] Edema: [Left: N] [Right: o] Calf Left: Right: Point of Measurement: 33 cm From Medial Instep  29.3 cm Ankle Left: Right: Point of Measurement: 9 cm From Medial Instep 17.3 cm Vascular Assessment Pulses: Dorsalis Pedis Palpable: [Left:No] Electronic Signature(s) Signed: 04/03/2022 4:51:47 PM By: Baruch Gouty RN, BSN Entered By: Baruch Gouty on 04/03/2022 13:55:07 -------------------------------------------------------------------------------- Multi Wound Chart Details Patient Name: Date of Service: [name] [name] 04/03/2022 1:15 PM Medical Record Number: [id number] Patient Account Number: [id number] Date of Birth/Sex: Treating RN: 1945-12-02 (76 y.o. 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 Etiology: Cataracts, Anemia, Chronic Cataracts, Anemia, Chronic Cataracts, Anemia, Chronic Comorbid History: Obstructive Pulmonary Disease Obstructive Pulmonary Disease Obstructive Pulmonary Disease (COPD), Coronary Artery Disease, (COPD), Coronary Artery Disease, (COPD), Coronary Artery Disease, Peripheral Venous Disease, Peripheral Venous Disease, Peripheral Venous Disease, Raynauds, Rheumatoid Arthritis, Raynauds, Rheumatoid Arthritis, Raynauds, Rheumatoid Arthritis, Osteoarthritis Osteoarthritis Osteoarthritis 08/06/2021 08/06/2021 08/06/2021 Date Acquired: '6 6 6 '$ Weeks of Treatment: Healed - Epithelialized Open Healed - Epithelialized Wound Status: No No No Wound Recurrence: 0x0x0 0.5x0.6x0.1 0x0x0 Measurements L x W x D (cm) 0 0.236 0 A (cm) : rea 0 0.024  0 Volume (cm) : 100.00% 81.20% 100.00% % Reduction in A rea: 100.00% 81.00% 100.00% % Reduction in Volume: Full Thickness Without Exposed Full Thickness Without Exposed Full Thickness Without Exposed Classification: Support Structures Support Structures Support Structures Medium Small None Present Exudate A mount: Serosanguineous Serosanguineous N/A Exudate Type: red, brown red, brown N/A Exudate Color: Flat and Intact Flat and Intact N/A Wound Margin: Large (67-100%) Large (67-100%) None Present (0%) Granulation A mount: Red, Pink Red N/A Granulation Quality: None Present (0%) None Present (0%) None Present (0%) Necrotic A mount: Fat Layer (Subcutaneous Tissue): Yes Fat Layer (Subcutaneous Tissue): Yes Fascia: No Exposed Structures: Fascia: No Fascia: No Fat Layer (Subcutaneous Tissue): No Tendon: No Tendon: No Tendon: No Muscle: No Muscle: No Muscle: No Joint: No Joint: No Joint: No Bone: No Bone: No Bone: No Medium (34-66%) Medium (34-66%) Large (67-100%) Epithelialization: N/A Debridement - Selective/Open Wound N/A Debridement: Pre-procedure Verification/Time Out N/A 14:00 N/A Taken: N/A Lidocaine 4% Topical Solution N/A Pain Control: N/A Necrotic/Eschar N/A Tissue Debrided: N/A Skin/Epidermis N/A Level: N/A 0.3 N/A Debridement A (sq cm): rea N/A Curette N/A Instrument: N/A Minimum N/A Bleeding: N/A Pressure N/A Hemostasis A chieved: N/A Procedure was tolerated well N/A Debridement Treatment Response: N/A 0.5x0.6x0.1 N/A Post Debridement Measurements L x W x D (cm) N/A 0.024 N/A Post Debridement Volume: (cm) N/A Compression Therapy N/A Procedures Performed: Debridement Treatment Notes Electronic Signature(s) Signed: 04/03/2022 2:08:49 PM By: Fredirick Maudlin MD FACS Signed: 04/03/2022 4:51:47 PM By: Baruch Gouty RN, BSN Entered By: Fredirick Maudlin on 04/03/2022  14:08:49 -------------------------------------------------------------------------------- Multi-Disciplinary Long Plan Details Patient Name: Date of Service: [name] [name] 04/03/2022 1:15 PM Medical Record Number: [id number] Patient Account Number: [id number] Date of Birth/Sex: Treating RN: 08/01/1946 (76 y.o. [name] Primary Long Karlo Goeden: [name] Other Clinician: Referring Mason Burleigh: Treating Shizuye Rupert/Extender: [name] in Treatment: 6 Active Inactive Venous Leg Ulcer Nursing Diagnoses: Knowledge deficit related to disease process and management Potential for venous Insuffiency (use before diagnosis confirmed) Goals: Patient/caregiver will verbalize understanding of disease process and disease management Date Initiated: 02/19/2022 Target Resolution Date: 04/09/2022 Goal Status: Active Verify adequate tissue perfusion prior to therapeutic compression application Date Initiated: 02/19/2022 Target Resolution Date: 04/09/2022 Goal Status: Active Interventions: Assess peripheral edema status every visit. Compression as ordered Provide education on venous insufficiency Treatment Activities: Therapeutic compression applied : 02/19/2022 Notes: Wound/Skin Impairment Nursing Diagnoses: Impaired tissue integrity Knowledge deficit related to ulceration/compromised skin integrity Goals: Patient/caregiver will verbalize understanding of skin Long regimen Date Initiated: 02/19/2022 Target Resolution Date: 04/09/2022 Goal Status: Active Ulcer/skin breakdown will have a volume reduction of 30% by week 4 Date Initiated: 02/19/2022 Target Resolution Date: 04/09/2022 Goal  Status: Active Interventions: Assess ulceration(s) every visit Provide education on ulcer and skin Long Treatment Activities: Skin Long regimen initiated : 02/19/2022 Topical wound management initiated : 02/19/2022 Notes: Electronic Signature(s) Signed: 04/03/2022 4:51:47 PM By:  Baruch Gouty RN, BSN Entered By: Baruch Gouty on 04/03/2022 14:00:49 -------------------------------------------------------------------------------- Pain Assessment Details Patient Name: Date of Service: [name] [name] 04/03/2022 1:15 PM Medical Record Number: [id number] Patient Account Number: [id number] Date of Birth/Sex: Treating RN: [birth year] (76 y.o. [name] Primary Long Zayven Powe: [name] Other Clinician: Referring Lynnzie Blackson: Treating Corabelle Spackman/Extender: [name] in Treatment: 6 Active Problems Location of Pain Severity and Description of Pain Patient Has Paino No Site Locations Rate the pain. Current Pain Level: 0 Pain Management and Medication Current Pain Management: Electronic Signature(s) Signed: 04/03/2022 4:51:47 PM By: Baruch Gouty RN, BSN Entered By: Baruch Gouty on 04/03/2022 13:47:22 -------------------------------------------------------------------------------- Patient/Caregiver Education Details Patient Name: Date of Service: [name] [name] 6/8/2023andnbsp1:15 PM Medical Record Number: [id number] Patient Account Number: [id number] Date of Birth/Gender: Treating RN: 11/18/1945 (76 y.o. [name] Primary Long Physician: [name] Other Clinician: Referring Physician: Treating Physician/Extender: [name] in Treatment: 6 Education Assessment Education Provided To: Patient Education Topics Provided Venous: Methods: Explain/Verbal Responses: Reinforcements needed, State content correctly Electronic Signature(s) Signed: 04/03/2022 4:51:47 PM By: Baruch Gouty RN, BSN Entered By: Baruch Gouty on 04/03/2022 14:01:03 -------------------------------------------------------------------------------- Wound Assessment Details Patient Name: Date of Service: [name] [name] 04/03/2022 1:15 PM Medical Record Number: [id number] Patient Account Number: [id number] Date  of Birth/Sex: Treating RN: April 07, 1946 (76 y.o. [name] Primary Long Fergus Throne: [name] Other Clinician: Referring Jermario Kalmar: Treating Doaa Kendzierski/Extender: [name] in Treatment: 6 Wound Status Wound Number: 1 Primary Venous Leg Ulcer Etiology: Wound Location: Left, Anterior Long Leg Wound Healed - Epithelialized Wounding Event: Trauma Status: Date Acquired: 08/06/2021 Comorbid Cataracts, Anemia, Chronic Obstructive Pulmonary Disease Weeks Of Treatment: 6 History: (COPD), Coronary Artery Disease, Peripheral Venous Disease, Clustered Wound: No Raynauds, Rheumatoid Arthritis, Osteoarthritis Photos Wound Measurements Length: (cm) Width: (cm) Depth: (cm) Area: (cm) Volume: (cm) 0 % Reduction in Area: 100% 0 % Reduction in Volume: 100% 0 Epithelialization: Medium (34-66%) 0 0 Wound Description Classification: Full Thickness Without Exposed Support Structures Wound Margin: Flat and Intact Exudate Amount: Medium Exudate Type: Serosanguineous Exudate Color: red, brown Foul Odor After Cleansing: No Slough/Fibrino Yes Wound Bed Granulation Amount: Large (67-100%) Exposed Structure Granulation Quality: Red, Pink Fascia Exposed: No Necrotic Amount: None Present (0%) Fat Layer (Subcutaneous Tissue) Exposed: Yes Tendon Exposed: No Muscle Exposed: No Joint Exposed: No Bone Exposed: No Electronic Signature(s) Signed: 04/03/2022 4:51:47 PM By: Baruch Gouty RN, BSN Entered By: Baruch Gouty on 04/03/2022 13:57:24 -------------------------------------------------------------------------------- Wound Assessment Details Patient Name: Date of Service: [name] [name] 04/03/2022 1:15 PM Medical Record Number: [id number] Patient Account Number: [id number] Date of Birth/Sex: Treating RN: 1946-04-15 (76 y.o. [name] Primary Long Kern Gingras: [name] Other Clinician: Referring Lacy Sofia: Treating Maika Mcelveen/Extender: [name] in Treatment: 6 Wound Status Wound Number: 2 Primary Venous Leg Ulcer Etiology: Wound Location: Left, Distal, Anterior Long Leg Wound Open Wounding Event: Trauma Status: Date Acquired: 08/06/2021 Comorbid Cataracts, Anemia, Chronic Obstructive Pulmonary Disease Weeks Of Treatment: 6 History: (COPD), Coronary Artery Disease, Peripheral Venous Disease, Clustered Wound: No Raynauds, Rheumatoid Arthritis, Osteoarthritis Photos Wound Measurements Length: (cm) 0.5 Width: (cm) 0.6 Depth: (cm) 0.1 Area: (cm) 0.236 Volume: (cm) 0.024 % Reduction in Area: 81.2% % Reduction in Volume: 81%  Epithelialization: Medium (34-66%) Tunneling: No Undermining: No Wound Description Classification: Full Thickness Without Exposed Support Structures Wound Margin: Flat and Intact Exudate Amount: Small Exudate Type: Serosanguineous Exudate Color: red, brown Foul Odor After Cleansing: No Slough/Fibrino No Wound Bed Granulation Amount: Large (67-100%) Exposed Structure Granulation Quality: Red Fascia Exposed: No Necrotic Amount: None Present (0%) Fat Layer (Subcutaneous Tissue) Exposed: Yes Tendon Exposed: No Muscle Exposed: No Joint Exposed: No Bone Exposed: No Treatment Notes Wound #2 (Long Leg) Wound Laterality: Left, Anterior, Distal Cleanser Soap and Water Discharge Instruction: May shower and wash wound with dial antibacterial soap and water prior to dressing change. Wound Cleanser Discharge Instruction: Cleanse the wound with wound cleanser prior to applying a clean dressing using gauze sponges, not tissue or cotton balls. Peri-Wound Long Sween Lotion (Moisturizing lotion) Discharge Instruction: Apply moisturizing lotion as directed Topical Mupirocin Ointment Discharge Instruction: Apply Mupirocin (Bactroban) as instructed Primary Dressing Promogran Prisma Matrix, 4.34 (sq in) (silver collagen) Discharge Instruction: Moisten collagen with saline  or hydrogel Secondary Dressing Woven Gauze Sponge, Non-Sterile 4x4 in Discharge Instruction: Apply over primary dressing as directed. Secured With Compression Wrap ThreePress (3 layer compression wrap) Discharge Instruction: Apply three layer compression as directed. Compression Stockings Add-Ons Electronic Signature(s) Signed: 04/03/2022 4:51:47 PM By: Baruch Gouty RN, BSN Entered By: Baruch Gouty on 04/03/2022 13:59:36 -------------------------------------------------------------------------------- Wound Assessment Details Patient Name: Date of Service: [name] [name] 04/03/2022 1:15 PM Medical Record Number: [id number] Patient Account Number: [id number] Date of Birth/Sex: Treating RN: 11/28/1945 (77 y.o. [name] Primary Long Darnell Jeschke: [name] Other Clinician: Referring Kashay Cavenaugh: Treating Luke Rigsbee/Extender: [name] in Treatment: 6 Wound Status Wound Number: 3 Primary Venous Leg Ulcer Etiology: Wound Location: Left, Medial Long Leg Wound Healed - Epithelialized Wounding Event: Trauma Status: Date Acquired: 08/06/2021 Comorbid Cataracts, Anemia, Chronic Obstructive Pulmonary Disease Weeks Of Treatment: 6 History: (COPD), Coronary Artery Disease, Peripheral Venous Disease, Clustered Wound: No Raynauds, Rheumatoid Arthritis, Osteoarthritis Photos Wound Measurements Length: (cm) Width: (cm) Depth: (cm) Area: (cm) Volume: (cm) 0 % Reduction in Area: 100% 0 % Reduction in Volume: 100% 0 Epithelialization: Large (67-100%) 0 Tunneling: No 0 Undermining: No Wound Description Classification: Full Thickness Without Exposed Support Structures Exudate Amount: None Present Foul Odor After Cleansing: No Slough/Fibrino No Wound Bed Granulation Amount: None Present (0%) Exposed Structure Necrotic Amount: None Present (0%) Fascia Exposed: No Fat Layer (Subcutaneous Tissue) Exposed: No Tendon Exposed: No Muscle Exposed:  No Joint Exposed: No Bone Exposed: No Electronic Signature(s) Signed: 04/03/2022 4:51:47 PM By: Baruch Gouty RN, BSN Entered By: Baruch Gouty on 04/03/2022 13:58:26 -------------------------------------------------------------------------------- [place] Details Patient Name: Date of Service: [name] [name] 04/03/2022 1:15 PM Medical Record Number: [id number] Patient Account Number: [id number] Date of Birth/Sex: Treating RN: 08/13/1946 (76 y.o. [name] Primary Long Keyston Ardolino: [name] Other Clinician: Referring Amalea Ottey: Treating Naturi Alarid/Extender: [name] in Treatment: 6 Vital Signs Time Taken: 13:42 Temperature (F): 98.4 Height (in): 61 Pulse (bpm): 99 Weight (lbs): 120 Respiratory Rate (breaths/min): 18 Body Mass Index (BMI): 22.7 Blood Pressure (mmHg): 128/62 Reference Range: 80 - 120 mg / dl Electronic Signature(s) Signed: 04/03/2022 4:51:47 PM By: Baruch Gouty RN, BSN Entered By: Baruch Gouty on 04/03/2022 13:47:14

## 2022-04-03 NOTE — Progress Notes (Signed)
[name], [name] ([id number]) Visit Report for 04/03/2022 Chief Complaint Document Details Patient Name: Date of Service: [name], [name] [name] 04/03/2022 1:15 PM Medical Record Number: [id number] Patient Account Number: [id number] Date of Birth/Sex: Treating RN: 09-Jul-1946 (76 y.o. [name] Primary Care Provider: Teressa Lower Other Clinician: Referring Provider: Treating Provider/Extender: Jodelle Gross in Treatment: 6 Information Obtained from: Patient Chief Complaint Patient seen for complaints of Non-Healing Wound. Electronic Signature(s) Signed: 04/03/2022 2:08:56 PM By: Fredirick Maudlin MD FACS Entered By: Fredirick Maudlin on 04/03/2022 14:08:56 -------------------------------------------------------------------------------- Debridement Details Patient Name: Date of Service: [name] [place] 04/03/2022 1:15 PM Medical Record Number: [id number] Patient Account Number: [id number] Date of Birth/Sex: Treating RN: January 01, 1946 (76 y.o. [name] Primary Care Provider: Teressa Lower Other Clinician: Referring Provider: Treating Provider/Extender: Jodelle Gross in Treatment: 6 Debridement Performed for Assessment: Wound #2 Left,Distal,Anterior Lower Leg Performed By: Physician Fredirick Maudlin, MD Debridement Type: Debridement Severity of Tissue Pre Debridement: Fat layer exposed Level of Consciousness (Pre-procedure): Awake and Alert Pre-procedure Verification/Time Out Yes - 14:00 Taken: Start Time: 14:05 Pain Control: Lidocaine 4% T opical Solution T Area Debrided (L x W): otal 0.5 (cm) x 0.6 (cm) = 0.3 (cm) Tissue and other material debrided: Non-Viable, Eschar, Skin: Epidermis Level: Skin/Epidermis Debridement Description: Selective/Open Wound Instrument: Curette Bleeding: Minimum Hemostasis Achieved: Pressure Response to Treatment: Procedure was tolerated well Level of Consciousness (Post- Awake and  Alert procedure): Post Debridement Measurements of Total Wound Length: (cm) 0.5 Width: (cm) 0.6 Depth: (cm) 0.1 Volume: (cm) 0.024 Character of Wound/Ulcer Post Debridement: Improved Severity of Tissue Post Debridement: Fat layer exposed Post Procedure Diagnosis Same as Pre-procedure Electronic Signature(s) Signed: 04/03/2022 3:54:01 PM By: Fredirick Maudlin MD FACS Signed: 04/03/2022 4:51:47 PM By: Baruch Gouty RN, BSN Entered By: Baruch Gouty on 04/03/2022 14:07:48 -------------------------------------------------------------------------------- HPI Details Patient Name: Date of Service: [name] [name] 04/03/2022 1:15 PM Medical Record Number: [id number] Patient Account Number: [id number] Date of Birth/Sex: Treating RN: [birth year] (76 y.o. [name] Primary Care Provider: Teressa Lower Other Clinician: Referring Provider: Treating Provider/Extender: Jodelle Gross in Treatment: 6 History of Present Illness HPI Description: ADMISSION 02/19/2022 This is a 76 year old woman with minimal pertinent medical history. She does have COPD and pulmonary hypertension, but is not diabetic and is not a smoker. About 6 months ago, she and her husband got a new beagle puppy. The puppy scratched her leg and the scratches ultimately deteriorated into ulcerations. Apparently she sought care with a dermatologist who recommended applying a one-to-one mixture of peroxide and water to the wounds followed by a thick layer of Vaseline. She continue this for some time but then saw her primary care provider who told her to discontinue the peroxide. She has not been on any antibiotics for the scratches. Today, there are 3 separate wounds on her anterior tibial surface on the left. They are tender and her leg has localized swelling. There is yellow slough buildup in each of the wound bases. ABI in clinic today was normal at 1.16. 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 02/25/2022: Over the past week, the wounds themselves have not improved tremendously but she has noticed some stinging and the periwound skin is quite inflamed. I took a culture last week that had low levels of methicillin-resistant Staph aureus. Because it was fairly low level, I did not prescribe an oral antibiotic. I had planned to change her to mupirocin today, however. We have  been using Santyl under Hydrofera Blue with 3 layer compression. 03/04/2022: The wounds have improved quite a bit over the past week. They are less painful and red. She has been taking the prescribed doxycycline but says that it gives her a stomachache. We have been using mupirocin with Hydrofera Blue and 3 layer compression. 03/11/2022: Continued improvement of the wounds. They have contracted quite a bit and have a good surface of healthy granulation tissue present. There is some dried eschar present around all 3 of the lesions. 03/17/2022: No significant change in the wounds from last week. The surface is clean with just a little bit of eschar and slough accumulation. No odor or drainage. No concern for infection. 03/26/2022: All of her wounds are smaller this week. There is good granulation tissue on the surface of each. No slough accumulation. There is just some dry skin around the wounds but not actually involving the wounds. No concern for infection. 04/03/2022: The 2 proximal wounds have healed. The distal wound is much smaller and just has a little bit of dry eschar around the edges. Electronic Signature(s) Signed: 04/03/2022 2:09:26 PM By: Fredirick Maudlin MD FACS Entered By: Fredirick Maudlin on 04/03/2022 14:09:26 -------------------------------------------------------------------------------- Physical Exam Details Patient Name: Date of Service: [name] [name] 04/03/2022 1:15 PM Medical Record Number: [id number] Patient Account Number: [id number] Date of Birth/Sex: Treating RN: 1946-06-23 (76 y.o.  [name] Primary Care Provider: Teressa Lower Other Clinician: Referring Provider: Treating Provider/Extender: Jodelle Gross in Treatment: 6 Constitutional . . . . No acute distress.Marland Kitchen Respiratory Normal work of breathing on room air.. Notes 04/03/2022: The 2 proximal wounds have healed. The distal wound is much smaller and just has a little bit of dry eschar around the edges. Electronic Signature(s) Signed: 04/03/2022 2:09:51 PM By: Fredirick Maudlin MD FACS Entered By: Fredirick Maudlin on 04/03/2022 14:09:51 -------------------------------------------------------------------------------- Physician Orders Details Patient Name: Date of Service: [name] [place] 04/03/2022 1:15 PM Medical Record Number: [id number] Patient Account Number: [id number] Date of Birth/Sex: Treating RN: 1946/09/21 (76 y.o. 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 - Dr. Celine Ahr Room 1 Return A Friday 6/16 @ 12:30 pm Bathing/ Shower/ Hygiene May shower with protection but do not get wound dressing(s) wet. - May purchase a cast protector from [place] or [facility] to cover dressing Edema Control - Lymphedema / SCD / Other Elevate legs to the level of the heart or above for 30 minutes daily and/or when sitting, a frequency of: - throughout the day Avoid standing for long periods of time. Exercise regularly Wound Treatment Wound #2 - Lower Leg Wound Laterality: Left, Anterior, Distal Cleanser: Soap and Water 1 x Per Week/30 Days Discharge Instructions: May shower and wash wound with dial antibacterial soap and water prior to dressing  change. Cleanser: Wound Cleanser 1 x Per Week/30 Days Discharge Instructions: Cleanse the wound with wound cleanser prior to applying a clean dressing using gauze sponges, not tissue or cotton balls. Peri-Wound Care: Sween Lotion (Moisturizing lotion) 1 x Per Week/30 Days Discharge Instructions: Apply moisturizing lotion as directed Topical: Mupirocin Ointment 1 x Per Week/30 Days Discharge Instructions: Apply Mupirocin (Bactroban) as instructed Prim Dressing: Promogran Prisma Matrix, 4.34 (sq in) (silver collagen) 1 x  Per Week/30 Days ary Discharge Instructions: Moisten collagen with saline or hydrogel Secondary Dressing: Woven Gauze Sponge, Non-Sterile 4x4 in 1 x Per Week/30 Days Discharge Instructions: Apply over primary dressing as directed. Compression Wrap: ThreePress (3 layer compression wrap) 1 x Per Week/30 Days Discharge Instructions: Apply three layer compression as directed. Electronic Signature(s) Signed: 04/03/2022 3:54:01 PM By: Fredirick Maudlin MD FACS Signed: 04/03/2022 4:51:47 PM By: Baruch Gouty RN, BSN Previous Signature: 04/03/2022 2:10:01 PM Version By: Fredirick Maudlin MD FACS Entered By: Baruch Gouty on 04/03/2022 14:10:11 -------------------------------------------------------------------------------- Problem List Details Patient Name: Date of Service: [name] [name] 04/03/2022 1:15 PM Medical Record Number: [id number] Patient Account Number: [id number] Date of Birth/Sex: Treating RN: Dec 13, 1945 (76 y.o. 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 04/03/2022 1:15 PM Medical Record Number: [id number] Patient Account Number: [id number] Date of Birth/Sex: Treating RN: [birth year] (76 y.o. [name] Primary Care Provider: Teressa Lower Other Clinician: Referring Provider: Treating Provider/Extender: Jodelle Gross in Treatment: 6 Subjective Chief Complaint Information obtained from Patient Patient seen for complaints of Non-Healing Wound. History of Present Illness (HPI) ADMISSION 02/19/2022 This is a 76 year old woman with minimal pertinent medical history. She does have COPD and pulmonary hypertension, but is not diabetic and is not a smoker. About 6 months ago, she and her husband got a new beagle puppy. The puppy scratched her leg and the scratches ultimately deteriorated into ulcerations. Apparently she sought care with a dermatologist who recommended applying a one-to-one mixture of peroxide and water to the wounds followed by a thick layer of Vaseline. She continue this for some time but then saw her primary care provider who told her to discontinue the peroxide. She has not been on any antibiotics for the scratches. Today, there are 3 separate wounds on her anterior tibial surface on the left. They are tender and her leg has localized swelling. There is yellow slough buildup in each of the wound bases. ABI in clinic today was normal at 1.16. 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 02/25/2022: Over the past week, the wounds themselves have not improved tremendously but she has noticed some stinging and the periwound skin is quite inflamed. I took a culture last week that had low  levels of methicillin-resistant Staph aureus. Because it was fairly low level, I did not prescribe an oral antibiotic. I had planned to change her to mupirocin today, however. We have been using Santyl under Hydrofera Blue with 3 layer compression. 03/04/2022: The wounds have improved quite a bit over the past week. They are less painful and red. She has been taking the prescribed doxycycline but says that it gives her a stomachache. We have been using mupirocin with Hydrofera Blue and 3 layer compression. 03/11/2022: Continued improvement of the wounds. They have contracted quite a bit and have a good surface of healthy granulation tissue present. There is some dried eschar present around all 3 of the lesions. 03/17/2022: No significant change  in the wounds from last week. The surface is clean with just a little bit of eschar and slough accumulation. No odor or drainage. No concern for infection. 03/26/2022: All of her wounds are smaller this week. There is good granulation tissue on the surface of each. No slough accumulation. There is just some dry skin around the wounds but not actually involving the wounds. No concern for infection. 04/03/2022: The 2 proximal wounds have healed. The distal wound is much smaller and just has a little bit of dry eschar around the edges. Patient History Information obtained from Patient, Caregiver. Family History Unknown History. Social History Former smoker, Marital Status - Married, Alcohol Use - Never, Drug Use - No History, Caffeine Use - Rarely. Medical History Eyes Patient has history of Cataracts - bil removed Denies history of Glaucoma, Optic Neuritis Ear/Nose/Mouth/Throat Denies history of Chronic sinus problems/congestion, Middle ear problems Hematologic/Lymphatic Patient has history of Anemia Respiratory Patient has history of Chronic Obstructive Pulmonary Disease (COPD) Cardiovascular Patient has history of Coronary Artery Disease, Peripheral  Venous Disease - varicose veins Endocrine Denies history of Type I Diabetes, Type II Diabetes Genitourinary Denies history of End Stage Renal Disease Immunological Patient has history of Raynaudoos Denies history of Lupus Erythematosus, Scleroderma Integumentary (Skin) Denies history of History of Burn Musculoskeletal Patient has history of Rheumatoid Arthritis, Osteoarthritis Denies history of Gout Oncologic Denies history of Received Chemotherapy, Received Radiation Psychiatric Denies history of Anorexia/bulimia, Confinement Anxiety Hospitalization/Surgery History - bil cataract removal. - left shoiulder replacement. - right rotator cuff repair. - multiple lumbar spine surgeries. - melanoma removed left arm and chest. - partial excision bone left 2nd toe. Medical A Surgical History Notes nd Hematologic/Lymphatic varicose vein of leg, mixed hyperlipidemia Respiratory dyspnea, chronic bronchitis, pulmonary hypertension, pulmonary emphysema Cardiovascular aortic calcification, tachycardia, left ventricular dysfunction, bradycardia Gastrointestinal Gerd Endocrine prediabetes, hypothyroidism Genitourinary bladder disorder Integumentary (Skin) contact dermatitis Musculoskeletal acquired plantar porokeratosis, neurogenic claudication d/t lumbar spinal stenosis, supraspinatus tendinitis, restless leg syndrome, spinal stenosis of lumbar region Neurologic TIA, insomnia Objective Constitutional No acute distress.. Vitals Time Taken: 1:42 PM, Height: 61 in, Weight: 120 lbs, BMI: 22.7, Temperature: 98.4 F, Pulse: 99 bpm, Respiratory Rate: 18 breaths/min, Blood Pressure: 128/62 mmHg. Respiratory Normal work of breathing on room air.. General Notes: 04/03/2022: The 2 proximal wounds have healed. The distal wound is much smaller and just has a little bit of dry eschar around the edges. Integumentary (Hair, Skin) Wound #1 status is Healed - Epithelialized. Original cause of wound  was Trauma. The date acquired was: 08/06/2021. The wound has been in treatment 6 weeks. The wound is located on the Left,Anterior Lower Leg. The wound measures 0cm length x 0cm width x 0cm depth; 0cm^2 area and 0cm^3 volume. There is Fat Layer (Subcutaneous Tissue) exposed. There is a medium amount of serosanguineous drainage noted. The wound margin is flat and intact. There is large (67- 100%) red, pink granulation within the wound bed. There is no necrotic tissue within the wound bed. Wound #2 status is Open. Original cause of wound was Trauma. The date acquired was: 08/06/2021. The wound has been in treatment 6 weeks. The wound is located on the [facility] Lower Leg. The wound measures 0.5cm length x 0.6cm width x 0.1cm depth; 0.236cm^2 area and 0.024cm^3 volume. There is Fat Layer (Subcutaneous Tissue) exposed. There is no tunneling or undermining noted. There is a small amount of serosanguineous drainage noted. The wound margin is flat and intact. There is large (67-100%) red granulation within the  wound bed. There is no necrotic tissue within the wound bed. Wound #3 status is Healed - Epithelialized. Original cause of wound was Trauma. The date acquired was: 08/06/2021. The wound has been in treatment 6 weeks. The wound is located on the Left,Medial Lower Leg. The wound measures 0cm length x 0cm width x 0cm depth; 0cm^2 area and 0cm^3 volume. There is no tunneling or undermining noted. There is a none present amount of drainage noted. There is no granulation within the wound bed. There is no necrotic tissue within the wound bed. 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 There was a Selective/Open Wound Skin/Epidermis  Debridement with a total area of 0.3 sq cm performed by Fredirick Maudlin, MD. With the following instrument(s): Curette to remove Non-Viable tissue/material. Material removed includes Eschar and Skin: Epidermis and after achieving pain control using Lidocaine 4% T opical Solution. No specimens were taken. A time out was conducted at 14:00, prior to the start of the procedure. A Minimum amount of bleeding was controlled with Pressure. The procedure was tolerated well. Post Debridement Measurements: 0.5cm length x 0.6cm width x 0.1cm depth; 0.024cm^3 volume. Character of Wound/Ulcer Post Debridement is improved. Severity of Tissue Post Debridement is: Fat layer exposed. Post procedure Diagnosis Wound #2: Same as Pre-Procedure Pre-procedure diagnosis of Wound #2 is a Venous Leg Ulcer located on the Left,Distal,Anterior Lower Leg . There was a Three Layer Compression Therapy Procedure by Baruch Gouty, RN. Post procedure Diagnosis Wound #2: Same as Pre-Procedure Plan 04/03/2022: The 2 proximal wounds have healed. The distal wound is much smaller and just has a little bit of dry eschar around the edges. I used a curette to debride the dry eschar from the open wound. We will continue with topical mupirocin and Hydrofera Blue with 3 layer compression. Follow-up in 1 week. Electronic Signature(s) Signed: 04/03/2022 2:10:29 PM By: Fredirick Maudlin MD FACS Entered By: Fredirick Maudlin on 04/03/2022 14:10:28 -------------------------------------------------------------------------------- HxROS Details Patient Name: Date of Service: [place], [name] 04/03/2022 1:15 PM Medical Record Number: [id number] Patient Account Number: [id number] Date of Birth/Sex: Treating RN: 21-Oct-1946 (76 y.o. [name] Primary Care Provider: Teressa Lower Other Clinician: Referring Provider: Treating Provider/Extender: Jodelle Gross in Treatment: 6 Information Obtained From Patient  Caregiver Eyes Medical History: Positive for: Cataracts - bil removed Negative for: Glaucoma; Optic Neuritis Ear/Nose/Mouth/Throat Medical History: Negative for: Chronic sinus problems/congestion; Middle ear problems Hematologic/Lymphatic Medical History: Positive for: Anemia Past Medical History Notes: varicose vein of leg, mixed hyperlipidemia Respiratory Medical History: Positive for: Chronic Obstructive Pulmonary Disease (COPD) Past Medical History Notes: dyspnea, chronic bronchitis, pulmonary hypertension, pulmonary emphysema Cardiovascular Medical History: Positive for: Coronary Artery Disease; Peripheral Venous Disease - varicose veins Past Medical History Notes: aortic calcification, tachycardia, left ventricular dysfunction, bradycardia Gastrointestinal Medical History: Past Medical History Notes: Gerd Endocrine Medical History: Negative for: Type I Diabetes; Type II Diabetes Past Medical History Notes: prediabetes, hypothyroidism Genitourinary Medical History: Negative for: End Stage Renal Disease Past Medical History Notes: bladder disorder Immunological Medical History: Positive for: Raynauds Negative for: Lupus Erythematosus; Scleroderma Integumentary (Skin) Medical History: Negative for: History of Burn Past Medical History Notes: contact dermatitis Musculoskeletal Medical History: Positive for: Rheumatoid Arthritis; Osteoarthritis Negative for: Gout Past Medical History Notes: acquired plantar porokeratosis, neurogenic  claudication d/t lumbar spinal stenosis, supraspinatus tendinitis, restless leg syndrome, spinal stenosis of lumbar region Neurologic Medical History: Past Medical History Notes: TIA, insomnia Oncologic Medical History: Negative for: Received Chemotherapy; Received Radiation Psychiatric Medical History: Negative for: Anorexia/bulimia; Confinement Anxiety HBO Extended History  Items Eyes: Cataracts Immunizations Pneumococcal Vaccine: Received Pneumococcal Vaccination: Yes Received Pneumococcal Vaccination On or After 60th Birthday: Yes Implantable Devices None Hospitalization / Surgery History Type of Hospitalization/Surgery bil cataract removal left shoiulder replacement right rotator cuff repair multiple lumbar spine surgeries melanoma removed left arm and chest partial excision bone left 2nd toe Family and Social History Unknown History: Yes; Former smoker; Marital Status - Married; Alcohol Use: Never; Drug Use: No History; Caffeine Use: Rarely; Financial Concerns: No; Food, Clothing or Shelter Needs: No; Support System Lacking: No; Transportation Concerns: No Engineer, maintenance) Signed: 04/03/2022 3:54:01 PM By: Fredirick Maudlin MD FACS Signed: 04/03/2022 4:51:47 PM By: Baruch Gouty RN, BSN Entered By: Fredirick Maudlin on 04/03/2022 14:09:31 -------------------------------------------------------------------------------- SuperBill Details Patient Name: Date of Service: [name] [name] 04/03/2022 Medical Record Number: [id number] Patient Account Number: [id number] Date of Birth/Sex: Treating RN: [birth year] (76 y.o. 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

## 2022-04-11 ENCOUNTER — Encounter (HOSPITAL_BASED_OUTPATIENT_CLINIC_OR_DEPARTMENT_OTHER): Payer: PPO | Admitting: General Surgery

## 2022-04-11 DIAGNOSIS — L97922 Non-pressure chronic ulcer of unspecified part of left lower leg with fat layer exposed: Secondary | ICD-10-CM | POA: Diagnosis not present

## 2022-04-11 NOTE — Progress Notes (Signed)
[name], [name] ([id number]) Visit Report for 04/11/2022 Chief Complaint Document Details Patient Name: Date of Service: TAMANA, HATFIELD [name] 04/11/2022 12:30 PM Medical Record Number: [id number] Patient Account Number: [id number] Date of Birth/Sex: Treating RN: 10/06/1946 (76 y.o. [name] Primary Care Provider: Teressa Lower Other Clinician: Referring Provider: Treating Provider/Extender: Jodelle Gross in Treatment: 7 Information Obtained from: Patient Chief Complaint Patient seen for complaints of Non-Healing Wound. Electronic Signature(s) Signed: 04/11/2022 1:08:31 PM By: Fredirick Maudlin MD FACS Entered By: Fredirick Maudlin on 04/11/2022 13:08:31 -------------------------------------------------------------------------------- HPI Details Patient Name: Date of Service: [name] [name] 04/11/2022 12:30 PM Medical Record Number: [id number] Patient Account Number: [id number] Date of Birth/Sex: Treating RN: [birth year] (76 y.o. [name] Primary Care Provider: Teressa Lower Other Clinician: Referring Provider: Treating Provider/Extender: Jodelle Gross in Treatment: 7 History of Present Illness HPI Description: ADMISSION 02/19/2022 This is a 76 year old woman with minimal pertinent medical history. She does have COPD and pulmonary hypertension, but is not diabetic and is not a smoker. About 6 months ago, she and her husband got a new beagle puppy. The puppy scratched her leg and the scratches ultimately deteriorated into ulcerations. Apparently she sought care with a dermatologist who recommended applying a one-to-one mixture of peroxide and water to the wounds followed by a thick layer of Vaseline. She continue this for some time but then saw her primary care provider who told her to discontinue the peroxide. She has not been on any antibiotics for the scratches. Today, there are 3 separate wounds on her anterior tibial surface on  the left. They are tender and her leg has localized swelling. There is yellow slough buildup in each of the wound bases. ABI in clinic today was normal at 1.16. 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 02/25/2022: Over the past week, the wounds themselves have not improved tremendously but she has noticed some stinging and the periwound skin is quite inflamed. I took a culture last week that had low levels of methicillin-resistant Staph aureus. Because it was fairly low level, I did not prescribe an oral antibiotic. I had planned to change her to mupirocin today, however. We have been using Santyl under Hydrofera Blue with 3 layer compression. 03/04/2022: The wounds have improved quite a bit over the past week. They are less painful and red. She has been taking the prescribed doxycycline but says that it gives her a stomachache. We have been using mupirocin with Hydrofera Blue and 3 layer compression. 03/11/2022: Continued improvement of the wounds. They have contracted quite a bit and have a good surface of healthy granulation tissue present. There is some dried eschar present around all 3 of the lesions. 03/17/2022: No significant change in the wounds from last week. The surface is clean with just a little bit of eschar and slough accumulation. No odor or drainage. No concern for infection. 03/26/2022: All of her wounds are smaller this week. There is good granulation tissue on the surface of each. No slough accumulation. There is just some dry skin around the wounds but not actually involving the wounds. No concern for infection. 04/04/2022: The 2 proximal wounds have healed. The distal wound is much smaller and just has a little bit of dry eschar around the edges. 04/11/2022: Her wound is nearly healed. It is clean without concern for infection. Electronic Signature(s) Signed: 04/11/2022 1:08:55 PM By: Fredirick Maudlin MD FACS Entered By: Fredirick Maudlin on 04/11/2022  13:08:55 -------------------------------------------------------------------------------- Physical  Exam Details Patient Name: Date of Service: [name], [name] [name] 04/11/2022 12:30 PM Medical Record Number: [id number] Patient Account Number: [id number] Date of Birth/Sex: Treating RN: 1946/10/19 (76 y.o. [name] Primary Care Provider: Teressa Lower Other Clinician: Referring Provider: Treating Provider/Extender: Jodelle Gross in Treatment: 7 Constitutional . . . . No acute distress.Marland Kitchen Respiratory Normal work of breathing on room air.. Notes 04/11/2022: Her wound is nearly healed. There are just 2 tiny openings and no concern for infection. Electronic Signature(s) Signed: 04/11/2022 1:09:46 PM By: Fredirick Maudlin MD FACS Entered By: Fredirick Maudlin on 04/11/2022 13:09:46 -------------------------------------------------------------------------------- Physician Orders Details Patient Name: Date of Service: [name] [name] 04/11/2022 12:30 PM Medical Record Number: [id number] Patient Account Number: [id number] Date of Birth/Sex: Treating RN: 1945-11-25 (76 y.o. 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 - Dr. Celine Ahr Room 3 Return A Friday 6/23 @ 2: pm Bathing/ Shower/ Hygiene May shower with protection but do not get wound dressing(s) wet. - May purchase a cast protector from [facility] or [facility] to cover dressing Edema Control - Lymphedema / SCD / Other Elevate legs to the level of the heart or above for 30 minutes daily and/or when sitting, a frequency of: - throughout the day Avoid standing for long periods of  time. Exercise regularly Wound Treatment Wound #2 - Lower Leg Wound Laterality: Left, Anterior, Distal Cleanser: Soap and Water 1 x Per Week/30 Days Discharge Instructions: May shower and wash wound with dial antibacterial soap and water prior to dressing change. Cleanser: Wound Cleanser 1 x Per Week/30 Days Discharge Instructions: Cleanse the wound with wound cleanser prior to applying a clean dressing using gauze sponges, not tissue or cotton balls. Peri-Wound Care: Sween Lotion (Moisturizing lotion) 1 x Per Week/30 Days Discharge Instructions: Apply moisturizing lotion as directed Topical: Mupirocin Ointment 1 x Per Week/30 Days Discharge Instructions: Apply Mupirocin (Bactroban) as instructed Prim Dressing: Promogran Prisma Matrix, 4.34 (sq in) (silver collagen) 1 x Per Week/30 Days ary Discharge Instructions: Moisten collagen with saline or hydrogel Secondary Dressing: Woven Gauze Sponge, Non-Sterile 4x4 in 1 x Per Week/30 Days Discharge Instructions: Apply over primary dressing as directed. Compression Wrap: ThreePress (3 layer compression wrap) 1 x Per Week/30 Days Discharge Instructions: Apply three layer compression as directed. Electronic Signature(s) Signed: 04/11/2022 1:10:03 PM By: Fredirick Maudlin MD FACS Entered By: Fredirick Maudlin on 04/11/2022 13:10:03 -------------------------------------------------------------------------------- Problem List Details Patient Name: Date of Service: [name] [name] 04/11/2022 12:30 PM Medical Record Number: [id number] Patient Account Number: [id number] Date of Birth/Sex: Treating RN: 04-23-1946 (76 y.o. 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 04/11/2022 12:30 PM Medical Record Number: [id number] Patient Account Number: [id number] Date of Birth/Sex: Treating RN: Jan 15, 1946 (76 y.o. [name] Primary Care Provider: Teressa Lower  Other Clinician: Referring Provider: Treating Provider/Extender: Jodelle Gross in Treatment: 7 Subjective Chief Complaint Information obtained from Patient Patient seen for complaints of Non-Healing Wound. History of Present Illness (HPI) ADMISSION 02/19/2022 This is a 76 year old woman with minimal pertinent medical history. She does have COPD and pulmonary hypertension, but is not diabetic and is not a smoker. About 6 months ago, she and her husband got a new beagle puppy. The puppy scratched her leg and the scratches ultimately deteriorated into ulcerations. Apparently she sought care with a dermatologist who recommended applying a one-to-one mixture of peroxide and water to the wounds followed by a thick layer of Vaseline. She continue this for some time but then saw her primary care provider who told her to discontinue the peroxide. She has not been on any antibiotics for the scratches. Today, there are 3 separate wounds on her anterior tibial surface on the left. They are tender and her leg has localized swelling. There is yellow slough buildup in each of the wound bases. ABI in clinic today was normal at 1.16. 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 02/25/2022: Over the past week, the wounds themselves have not improved tremendously but she has noticed some stinging and the periwound skin  is quite inflamed. I took a culture last week that had low levels of methicillin-resistant Staph aureus. Because it was fairly low level, I did not prescribe an oral antibiotic. I had planned to change her to mupirocin today, however. We have been using Santyl under Hydrofera Blue with 3 layer compression. 03/04/2022: The wounds have improved quite a bit over the past week. They are less painful and red. She has been taking the prescribed doxycycline but says that it gives her a stomachache. We have been using mupirocin with Hydrofera Blue and 3 layer compression. 03/11/2022: Continued improvement of the wounds. They have contracted quite a bit and have a good surface of healthy granulation tissue present. There is some dried eschar present around all 3 of the lesions. 03/17/2022: No significant change in the wounds from last week. The surface is clean with just a little bit of eschar and slough accumulation. No odor or drainage. No concern for infection. 03/26/2022: All of her wounds are smaller this week. There is good granulation tissue on the surface of each. No slough accumulation. There is just some dry skin around the wounds but not actually involving the wounds. No concern for infection. 04/04/2022: The 2 proximal wounds have healed. The distal wound is much smaller and just has a little bit of dry eschar around the edges. 04/11/2022: Her wound is nearly healed. It is clean without concern for infection. Patient History Information obtained from Patient, Caregiver. Family History Unknown History. Social History Former smoker, Marital Status - Married, Alcohol Use - Never, Drug Use - No History, Caffeine Use - Rarely. Medical History Eyes Patient has history of Cataracts - bil removed Denies history of Glaucoma, Optic Neuritis Ear/Nose/Mouth/Throat Denies history of Chronic sinus problems/congestion, Middle ear problems Hematologic/Lymphatic Patient has history of  Anemia Respiratory Patient has history of Chronic Obstructive Pulmonary Disease (COPD) Cardiovascular Patient has history of Coronary Artery Disease, Peripheral Venous Disease - varicose veins Endocrine Denies history of Type I Diabetes, Type II Diabetes Genitourinary Denies history of End Stage Renal Disease Immunological Patient has history of Raynaudoos Denies history of Lupus Erythematosus, Scleroderma Integumentary (Skin) Denies history of History of Burn Musculoskeletal Patient has history of Rheumatoid Arthritis, Osteoarthritis Denies history of Gout  Oncologic Denies history of Received Chemotherapy, Received Radiation Psychiatric Denies history of Anorexia/bulimia, Confinement Anxiety Hospitalization/Surgery History - bil cataract removal. - left shoiulder replacement. - right rotator cuff repair. - multiple lumbar spine surgeries. - melanoma removed left arm and chest. - partial excision bone left 2nd toe. Medical A Surgical History Notes nd Hematologic/Lymphatic varicose vein of leg, mixed hyperlipidemia Respiratory dyspnea, chronic bronchitis, pulmonary hypertension, pulmonary emphysema Cardiovascular aortic calcification, tachycardia, left ventricular dysfunction, bradycardia Gastrointestinal Gerd Endocrine prediabetes, hypothyroidism Genitourinary bladder disorder Integumentary (Skin) contact dermatitis Musculoskeletal acquired plantar porokeratosis, neurogenic claudication d/t lumbar spinal stenosis, supraspinatus tendinitis, restless leg syndrome, spinal stenosis of lumbar region Neurologic TIA, insomnia Objective Constitutional No acute distress.. Vitals Time Taken: 12:46 PM, Height: 61 in, Source: Stated, Weight: 120 lbs, Source: Stated, BMI: 22.7, Temperature: 98.2 F, Pulse: 98 bpm, Respiratory Rate: 18 breaths/min, Blood Pressure: 118/66 mmHg. Respiratory Normal work of breathing on room air.. General Notes: 04/11/2022: Her wound is nearly  healed. There are just 2 tiny openings and no concern for infection. Integumentary (Hair, Skin) Wound #2 status is Open. Original cause of wound was Trauma. The date acquired was: 08/06/2021. The wound has been in treatment 7 weeks. The wound is located on the [facility] Lower Leg. The wound measures 0.2cm length x 0.7cm width x 0.1cm depth; 0.11cm^2 area and 0.011cm^3 volume. There is Fat Layer (Subcutaneous Tissue) exposed. There is no tunneling or undermining noted. There is a small amount of serosanguineous drainage noted. The wound margin is flat and intact. There is small (1-33%) red granulation within the wound bed. There is no necrotic tissue within the wound bed. 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 - Dr. Celine Ahr Room 3 Friday 6/23 @ 2: pm Bathing/ Shower/ Hygiene: May shower with protection but do not get wound dressing(s) wet. - May purchase a cast protector from [place] or [facility] to cover dressing Edema Control - Lymphedema / SCD / Other: Elevate legs to the level of the heart or above for 30 minutes daily and/or when sitting, a frequency of: - throughout the day Avoid standing for long periods of time. Exercise regularly WOUND #2: - Lower Leg Wound Laterality: Left, Anterior, Distal Cleanser: Soap and Water 1 x Per Week/30 Days Discharge Instructions: May shower and wash wound with dial antibacterial soap and water prior to dressing change. Cleanser: Wound Cleanser 1 x Per Week/30 Days Discharge Instructions: Cleanse the wound with wound cleanser prior to applying a clean dressing using gauze sponges, not tissue or cotton balls. Peri-Wound Care: Sween Lotion (Moisturizing lotion) 1 x Per Week/30 Days Discharge Instructions: Apply moisturizing lotion as directed Topical: Mupirocin Ointment 1 x Per Week/30 Days Discharge  Instructions: Apply Mupirocin (Bactroban) as instructed Prim Dressing: Promogran Prisma Matrix, 4.34 (sq in) (silver collagen) 1 x Per Week/30 Days ary Discharge Instructions: Moisten collagen with saline or hydrogel Secondary Dressing: Woven Gauze Sponge, Non-Sterile 4x4 in 1 x Per Week/30 Days Discharge Instructions: Apply over primary dressing as directed. Com pression Wrap: ThreePress (3 layer compression wrap) 1 x Per Week/30 Days Discharge Instructions: Apply three layer compression as directed. 04/11/2022: Her wound is nearly healed. There are just 2 tiny openings and no concern for infection. No debridement was necessary today. Continue Prisma and 3 layer compression. Follow-up in 1 week. Electronic Signature(s) Signed: 04/11/2022 1:10:24 PM By: Fredirick Maudlin MD FACS Entered By: Fredirick Maudlin on 04/11/2022 13:10:24 -------------------------------------------------------------------------------- HxROS  Details Patient Name: Date of Service: [name], [name] [name] 04/11/2022 12:30 PM Medical Record Number: [id number] Patient Account Number: [id number] Date of Birth/Sex: Treating RN: 02/13/1946 (76 y.o. [name] Primary Care Provider: Teressa Lower Other Clinician: Referring Provider: Treating Provider/Extender: Jodelle Gross in Treatment: 7 Information Obtained From Patient Caregiver Eyes Medical History: Positive for: Cataracts - bil removed Negative for: Glaucoma; Optic Neuritis Ear/Nose/Mouth/Throat Medical History: Negative for: Chronic sinus problems/congestion; Middle ear problems Hematologic/Lymphatic Medical History: Positive for: Anemia Past Medical History Notes: varicose vein of leg, mixed hyperlipidemia Respiratory Medical History: Positive for: Chronic Obstructive Pulmonary Disease (COPD) Past Medical History Notes: dyspnea, chronic bronchitis, pulmonary hypertension, pulmonary emphysema Cardiovascular Medical  History: Positive for: Coronary Artery Disease; Peripheral Venous Disease - varicose veins Past Medical History Notes: aortic calcification, tachycardia, left ventricular dysfunction, bradycardia Gastrointestinal Medical History: Past Medical History Notes: Gerd Endocrine Medical History: Negative for: Type I Diabetes; Type II Diabetes Past Medical History Notes: prediabetes, hypothyroidism Genitourinary Medical History: Negative for: End Stage Renal Disease Past Medical History Notes: bladder disorder Immunological Medical History: Positive for: Raynauds Negative for: Lupus Erythematosus; Scleroderma Integumentary (Skin) Medical History: Negative for: History of Burn Past Medical History Notes: contact dermatitis Musculoskeletal Medical History: Positive for: Rheumatoid Arthritis; Osteoarthritis Negative for: Gout Past Medical History Notes: acquired plantar porokeratosis, neurogenic claudication d/t lumbar spinal stenosis, supraspinatus tendinitis, restless leg syndrome, spinal stenosis of lumbar region Neurologic Medical History: Past Medical History Notes: TIA, insomnia Oncologic Medical History: Negative for: Received Chemotherapy; Received Radiation Psychiatric Medical History: Negative for: Anorexia/bulimia; Confinement Anxiety HBO Extended History Items Eyes: Cataracts Immunizations Pneumococcal Vaccine: Received Pneumococcal Vaccination: Yes Received Pneumococcal Vaccination On or After 60th Birthday: Yes Implantable Devices None Hospitalization / Surgery History Type of Hospitalization/Surgery bil cataract removal left shoiulder replacement right rotator cuff repair multiple lumbar spine surgeries melanoma removed left arm and chest partial excision bone left 2nd toe Family and Social History Unknown History: Yes; Former smoker; Marital Status - Married; Alcohol Use: Never; Drug Use: No History; Caffeine Use: Rarely; Financial Concerns:  No; Food, Clothing or Shelter Needs: No; Support System Lacking: No; Transportation Concerns: No Engineer, maintenance) Signed: 04/11/2022 2:17:12 PM By: Fredirick Maudlin MD FACS Signed: 04/11/2022 5:32:05 PM By: Baruch Gouty RN, BSN Entered By: Fredirick Maudlin on 04/11/2022 13:09:01 -------------------------------------------------------------------------------- SuperBill Details Patient Name: Date of Service: [name] [name] 04/11/2022 Medical Record Number: [id number] Patient Account Number: [id number] Date of Birth/Sex: Treating RN: 06/17/1946 (76 y.o. 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

## 2022-04-11 NOTE — Progress Notes (Signed)
LENDA, BARATTA ([id number]) Visit Report for 04/11/2022 Arrival Information Details Patient Name: Date of Service: [name], [name] [name] 04/11/2022 12:30 PM Medical Record Number: [id number] Patient Account Number: [id number] Date of Birth/Sex: Treating RN: [birth year] (76 y.o. [name], [name] Primary Care Houa Nie: Teressa Lower Other Clinician: Referring Baila Rouse: Treating Dellar Traber/Extender: Jodelle Gross in Treatment: 7 Visit Information History Since Last Visit Added or deleted any medications: Yes Patient Arrived: Ambulatory Any new allergies or adverse reactions: No Arrival Time: 12:44 Had a fall or experienced change in No Accompanied By: spouse activities of daily living that may affect Transfer Assistance: None risk of falls: Patient Identification Verified: Yes Signs or symptoms of abuse/neglect since last visito No Secondary Verification Process Completed: Yes Hospitalized since last visit: No Patient Requires Transmission-Based Precautions: No Implantable device outside of the clinic excluding No Patient Has Alerts: No cellular tissue based products placed in the center since last visit: Has Dressing in Place as Prescribed: Yes Has Compression in Place as Prescribed: Yes Pain Present Now: No Electronic Signature(s) Signed: 04/11/2022 5:32:05 PM By: Baruch Gouty RN, BSN Entered By: Baruch Gouty on 04/11/2022 12:46:05 -------------------------------------------------------------------------------- Compression Therapy Details Patient Name: Date of Service: [name] [name] 04/11/2022 12:30 PM Medical Record Number: [id number] Patient Account Number: [id number] Date of Birth/Sex: Treating RN: [birth year] (76 y.o. [name] Primary Care Taffie Eckmann: Teressa Lower Other Clinician: Referring Esmeralda Blanford: Treating Pollyanna Levay/Extender: Jodelle Gross in Treatment: 7 Compression Therapy Performed for Wound Assessment: Wound #2  Left,Distal,Anterior Lower Leg Performed By: Clinician Baruch Gouty, RN Compression Type: Three Layer Post Procedure Diagnosis Same as Pre-procedure Electronic Signature(s) Signed: 04/11/2022 5:32:05 PM By: Baruch Gouty RN, BSN Entered By: Baruch Gouty on 04/11/2022 13:17:33 -------------------------------------------------------------------------------- Encounter Discharge Information Details Patient Name: Date of Service: [name] [name] 04/11/2022 12:30 PM Medical Record Number: [id number] Patient Account Number: [id number] Date of Birth/Sex: Treating RN: 07-04-1946 (76 y.o. [name] Primary Care Denay Pleitez: Teressa Lower Other Clinician: Referring Kamrie Fanton: Treating Christia Coaxum/Extender: Jodelle Gross in Treatment: 7 Encounter Discharge Information Items Discharge Condition: Stable Ambulatory Status: Ambulatory Discharge Destination: Home Transportation: Private Auto Accompanied By: spouse Schedule Follow-up Appointment: Yes Clinical Summary of Care: Patient Declined Electronic Signature(s) Signed: 04/11/2022 5:32:05 PM By: Baruch Gouty RN, BSN Entered By: Baruch Gouty on 04/11/2022 13:14:28 -------------------------------------------------------------------------------- Lower Extremity Assessment Details Patient Name: Date of Service: [name] [name] 04/11/2022 12:30 PM Medical Record Number: [id number] Patient Account Number: [id number] Date of Birth/Sex: Treating RN: September 15, 1946 (76 y.o. [name] Primary Care Savayah Waltrip: Teressa Lower Other Clinician: Referring Elsey Holts: Treating Bomani Oommen/Extender: Jodelle Gross in Treatment: 7 Edema Assessment Assessed: Shirlyn Goltz: No] [Right: No] Edema: [Left: N] [Right: o] Calf Left: Right: Point of Measurement: 33 cm From Medial Instep 28.5 cm Ankle Left: Right: Point of Measurement: 9 cm From Medial Instep 17.4 cm Vascular Assessment Pulses: Dorsalis  Pedis Palpable: [Left:Yes] Electronic Signature(s) Signed: 04/11/2022 5:32:05 PM By: Baruch Gouty RN, BSN Entered By: Baruch Gouty on 04/11/2022 12:49:09 -------------------------------------------------------------------------------- Multi Wound Chart Details Patient Name: Date of Service: [name] [name] 04/11/2022 12:30 PM Medical Record Number: [id number] Patient Account Number: [id number] Date of Birth/Sex: Treating RN: November 13, 1945 (76 y.o. [name] Primary Care Devlyn Retter: Teressa Lower Other Clinician: Referring Arvle Grabe: Treating Tawyna Pellot/Extender: Jodelle Gross in Treatment: 7 Vital Signs Height(in): 61 Pulse(bpm): 98 Weight(lbs): 120 Blood Pressure(mmHg): 118/66 Body Mass Index(BMI): 22.7 Temperature(F): 98.2 Respiratory Rate(breaths/min): 18 Photos: [N/A:N/A] Left, Distal, Anterior Lower Leg  N/A N/A Wound Location: Trauma N/A N/A Wounding Event: Venous Leg Ulcer N/A N/A Primary Etiology: Cataracts, Anemia, Chronic N/A N/A Comorbid History: Obstructive Pulmonary Disease (COPD), Coronary Artery Disease, Peripheral Venous Disease, Raynauds, Rheumatoid Arthritis, Osteoarthritis 08/06/2021 N/A N/A Date Acquired: 7 N/A N/A Weeks of Treatment: Open N/A N/A Wound Status: No N/A N/A Wound Recurrence: 0.2x0.7x0.1 N/A N/A Measurements L x W x D (cm) 0.11 N/A N/A A (cm) : rea 0.011 N/A N/A Volume (cm) : 91.20% N/A N/A % Reduction in Area: 91.30% N/A N/A % Reduction in Volume: Full Thickness Without Exposed N/A N/A Classification: Support Structures Small N/A N/A Exudate Amount: Serosanguineous N/A N/A Exudate Type: red, brown N/A N/A Exudate Color: Flat and Intact N/A N/A Wound Margin: Small (1-33%) N/A N/A Granulation Amount: Red N/A N/A Granulation Quality: None Present (0%) N/A N/A Necrotic Amount: Fat Layer (Subcutaneous Tissue): Yes N/A N/A Exposed Structures: Fascia: No Tendon: No Muscle:  No Joint: No Bone: No Large (67-100%) N/A N/A Epithelialization: Treatment Notes Electronic Signature(s) Signed: 04/11/2022 1:08:25 PM By: Fredirick Maudlin MD FACS Signed: 04/11/2022 5:32:05 PM By: Baruch Gouty RN, BSN Entered By: Fredirick Maudlin on 04/11/2022 13:08:25 -------------------------------------------------------------------------------- Multi-Disciplinary Care Plan Details Patient Name: Date of Service: [name] [name] 04/11/2022 12:30 PM Medical Record Number: [id number] Patient Account Number: [id number] Date of Birth/Sex: Treating RN: Feb 25, 1946 (76 y.o. [name] Primary Care Laylynn Campanella: Teressa Lower Other Clinician: Referring Brinna Divelbiss: Treating Dean Wonder/Extender: Jodelle Gross in Treatment: 7 Multidisciplinary Care Plan reviewed with physician Active Inactive Venous Leg Ulcer Nursing Diagnoses: Knowledge deficit related to disease process and management Potential for venous Insuffiency (use before diagnosis confirmed) Goals: Patient will maintain optimal edema control Date Initiated: 04/11/2022 Target Resolution Date: 05/09/2022 Goal Status: Active Patient/caregiver will verbalize understanding of disease process and disease management Date Initiated: 02/19/2022 Date Inactivated: 04/11/2022 Target Resolution Date: 04/09/2022 Goal Status: Met Verify adequate tissue perfusion prior to therapeutic compression application Date Initiated: 02/19/2022 Date Inactivated: 04/11/2022 Target Resolution Date: 04/09/2022 Goal Status: Met Interventions: Assess peripheral edema status every visit. Compression as ordered Provide education on venous insufficiency Treatment Activities: Therapeutic compression applied : 02/19/2022 Notes: Wound/Skin Impairment Nursing Diagnoses: Impaired tissue integrity Knowledge deficit related to ulceration/compromised skin integrity Goals: Patient/caregiver will verbalize understanding of skin care  regimen Date Initiated: 02/19/2022 Target Resolution Date: 05/09/2022 Goal Status: Active Ulcer/skin breakdown will have a volume reduction of 30% by week 4 Date Initiated: 02/19/2022 Date Inactivated: 04/11/2022 Target Resolution Date: 04/09/2022 Goal Status: Met Ulcer/skin breakdown will have a volume reduction of 50% by week 8 Date Initiated: 04/11/2022 Target Resolution Date: 05/09/2022 Goal Status: Active Interventions: Assess ulceration(s) every visit Provide education on ulcer and skin care Treatment Activities: Skin care regimen initiated : 02/19/2022 Topical wound management initiated : 02/19/2022 Notes: Electronic Signature(s) Signed: 04/11/2022 5:32:05 PM By: Baruch Gouty RN, BSN Signed: 04/11/2022 5:32:05 PM By: Baruch Gouty RN, BSN Entered By: Baruch Gouty on 04/11/2022 12:58:00 -------------------------------------------------------------------------------- Pain Assessment Details Patient Name: Date of Service: [name] [name] 04/11/2022 12:30 PM Medical Record Number: [id number] Patient Account Number: [id number] Date of Birth/Sex: Treating RN: 1946/07/17 (76 y.o. [name] Primary Care Kainoa Swoboda: Teressa Lower Other Clinician: Referring Yeray Tomas: Treating Merna Baldi/Extender: Jodelle Gross in Treatment: 7 Active Problems Location of Pain Severity and Description of Pain Patient Has Paino No Site Locations Rate the pain. Current Pain Level: 0 Pain Management and Medication Current Pain Management: Electronic Signature(s) Signed: 04/11/2022 5:32:05 PM By: Baruch Gouty RN, BSN Entered By:  Baruch Gouty on 04/11/2022 12:46:34 -------------------------------------------------------------------------------- Patient/Caregiver Education Details Patient Name: Date of Service: MASON, BURLEIGH. 6/16/2023andnbsp12:30 PM Medical Record Number: [id number] Patient Account Number: [id number] Date of Birth/Gender: Treating  RN: 05-26-1946 (76 y.o. [name] Primary Care Physician: Teressa Lower Other Clinician: Referring Physician: Treating Physician/Extender: Jodelle Gross in Treatment: 7 Education Assessment Education Provided To: Patient Education Topics Provided Venous: Methods: Explain/Verbal Responses: Reinforcements needed, State content correctly Wound/Skin Impairment: Methods: Explain/Verbal Responses: Reinforcements needed, State content correctly Electronic Signature(s) Signed: 04/11/2022 5:32:05 PM By: Baruch Gouty RN, BSN Entered By: Baruch Gouty on 04/11/2022 12:59:04 -------------------------------------------------------------------------------- Wound Assessment Details Patient Name: Date of Service: [name] [name] 04/11/2022 12:30 PM Medical Record Number: [id number] Patient Account Number: [id number] Date of Birth/Sex: Treating RN: November 25, 1945 (76 y.o. [name] Primary Care Safia Panzer: Teressa Lower Other Clinician: Referring Nehal Witting: Treating Maddy Graham/Extender: Jodelle Gross in Treatment: 7 Wound Status Wound Number: 2 Primary Venous Leg Ulcer Etiology: Wound Location: Left, Distal, Anterior Lower Leg Wound Open Wounding Event: Trauma Status: Date Acquired: 08/06/2021 Comorbid Cataracts, Anemia, Chronic Obstructive Pulmonary Disease Weeks Of Treatment: 7 History: (COPD), Coronary Artery Disease, Peripheral Venous Disease, Clustered Wound: No Raynauds, Rheumatoid Arthritis, Osteoarthritis Photos Wound Measurements Length: (cm) 0.2 Width: (cm) 0.7 Depth: (cm) 0.1 Area: (cm) 0.11 Volume: (cm) 0.011 % Reduction in Area: 91.2% % Reduction in Volume: 91.3% Epithelialization: Large (67-100%) Tunneling: No Undermining: No Wound Description Classification: Full Thickness Without Exposed Support Structures Wound Margin: Flat and Intact Exudate Amount: Small Exudate Type: Serosanguineous Exudate  Color: red, brown Foul Odor After Cleansing: No Slough/Fibrino No Wound Bed Granulation Amount: Small (1-33%) Exposed Structure Granulation Quality: Red Fascia Exposed: No Necrotic Amount: None Present (0%) Fat Layer (Subcutaneous Tissue) Exposed: Yes Tendon Exposed: No Muscle Exposed: No Joint Exposed: No Bone Exposed: No Treatment Notes Wound #2 (Lower Leg) Wound Laterality: Left, Anterior, Distal Cleanser Soap and Water Discharge Instruction: May shower and wash wound with dial antibacterial soap and water prior to dressing change. Wound Cleanser Discharge Instruction: Cleanse the wound with wound cleanser prior to applying a clean dressing using gauze sponges, not tissue or cotton balls. Peri-Wound Care Sween Lotion (Moisturizing lotion) Discharge Instruction: Apply moisturizing lotion as directed Topical Mupirocin Ointment Discharge Instruction: Apply Mupirocin (Bactroban) as instructed Primary Dressing Promogran Prisma Matrix, 4.34 (sq in) (silver collagen) Discharge Instruction: Moisten collagen with saline or hydrogel Secondary Dressing Woven Gauze Sponge, Non-Sterile 4x4 in Discharge Instruction: Apply over primary dressing as directed. Secured With Compression Wrap ThreePress (3 layer compression wrap) Discharge Instruction: Apply three layer compression as directed. Compression Stockings Add-Ons Electronic Signature(s) Signed: 04/11/2022 5:32:05 PM By: Baruch Gouty RN, BSN Entered By: Baruch Gouty on 04/11/2022 12:55:46 -------------------------------------------------------------------------------- Vitals Details Patient Name: Date of Service: [name] [name] 04/11/2022 12:30 PM Medical Record Number: [id number] Patient Account Number: [id number] Date of Birth/Sex: Treating RN: June 30, 1946 (76 y.o. [name] Primary Care Beatric Fulop: Teressa Lower Other Clinician: Referring Kanyon Bunn: Treating Elizardo Chilson/Extender: Jodelle Gross in Treatment: 7 Vital Signs Time Taken: 12:46 Temperature (F): 98.2 Height (in): 61 Pulse (bpm): 98 Source: Stated Respiratory Rate (breaths/min): 18 Weight (lbs): 120 Blood Pressure (mmHg): 118/66 Source: Stated Reference Range: 80 - 120 mg / dl Body Mass Index (BMI): 22.7 Electronic Signature(s) Signed: 04/11/2022 5:32:05 PM By: Baruch Gouty RN, BSN Entered By: Baruch Gouty on 04/11/2022 12:46:26

## 2022-04-18 ENCOUNTER — Encounter (HOSPITAL_BASED_OUTPATIENT_CLINIC_OR_DEPARTMENT_OTHER): Payer: PPO | Admitting: General Surgery

## 2022-04-18 DIAGNOSIS — L97922 Non-pressure chronic ulcer of unspecified part of left lower leg with fat layer exposed: Secondary | ICD-10-CM | POA: Diagnosis not present

## 2023-01-26 ENCOUNTER — Encounter (HOSPITAL_BASED_OUTPATIENT_CLINIC_OR_DEPARTMENT_OTHER): Payer: PPO | Attending: General Surgery | Admitting: General Surgery

## 2023-01-26 DIAGNOSIS — I7300 Raynaud's syndrome without gangrene: Secondary | ICD-10-CM | POA: Insufficient documentation

## 2023-01-26 DIAGNOSIS — E782 Mixed hyperlipidemia: Secondary | ICD-10-CM | POA: Diagnosis not present

## 2023-01-26 DIAGNOSIS — I2510 Atherosclerotic heart disease of native coronary artery without angina pectoris: Secondary | ICD-10-CM | POA: Insufficient documentation

## 2023-01-26 DIAGNOSIS — I272 Pulmonary hypertension, unspecified: Secondary | ICD-10-CM | POA: Insufficient documentation

## 2023-01-26 DIAGNOSIS — L97822 Non-pressure chronic ulcer of other part of left lower leg with fat layer exposed: Secondary | ICD-10-CM | POA: Diagnosis not present

## 2023-01-26 DIAGNOSIS — M069 Rheumatoid arthritis, unspecified: Secondary | ICD-10-CM | POA: Diagnosis not present

## 2023-01-26 DIAGNOSIS — J449 Chronic obstructive pulmonary disease, unspecified: Secondary | ICD-10-CM | POA: Insufficient documentation

## 2023-01-27 NOTE — Progress Notes (Signed)
[name], [name] ([id number]) [id number] Page 1 of 4 Visit Report for 01/26/2023 Abuse Risk Screen Details Patient Name: Date of Service: [name], [name]. 01/26/2023 9:45 A M Medical Record Number: [id number] Patient Account Number: [id number] Date of Birth/Sex: Treating RN: 10/29/1945 (77 y.o. Helene Shoe, Tammi Klippel Primary Care Lorian Yaun: Teressa Lower Other Clinician: Referring Brena Windsor: Treating Darria Corvera/Extender: Jodelle Gross in Treatment: 0 Abuse Risk Screen Items Answer ABUSE RISK SCREEN: Has anyone close to you tried to hurt or harm you recentlyo No Do you feel uncomfortable with anyone in your familyo [name] Has anyone forced you do things that you didnt want to doo [name] Electronic Signature(s) Signed: 01/27/2023 4:09:50 PM By: Deon Pilling RN, BSN Entered By: Deon Pilling on 01/26/2023 09:52:59 -------------------------------------------------------------------------------- Activities of Daily Living Details Patient Name: Date of Service: [name], [name] RMA Long. 01/26/2023 9:45 A M Medical Record Number: [id number] Patient Account Number: [id number] Date of Birth/Sex: Treating RN: 1946/04/02 (77 y.o. Helene Shoe, Tammi Klippel Primary Care Shalea Tomczak: Teressa Lower Other Clinician: Referring Marycruz Boehner: Treating Mariafernanda Hendricksen/Extender: Jodelle Gross in Treatment: 0 Activities of Daily Living Items Answer Activities of Daily Living (Please select one for each item) Drive Automobile Completely Able T Medications ake Completely Able Use T elephone Completely Able Care for Appearance Completely Able Use T oilet Completely Able Bath / Shower Completely Able Dress Self Completely Able Feed Self Completely Able Walk Completely Able Get In / Out Bed Completely Able Housework Completely Able Prepare Meals Completely [place] Completely Able Shop for Self Completely Able Electronic Signature(s) Signed: 01/27/2023 4:09:50 PM By:  Deon Pilling RN, BSN Entered By: Deon Pilling on 01/26/2023 09:53:12 -------------------------------------------------------------------------------- Education Screening Details Patient Name: Date of Service: [name] RMA Long. 01/26/2023 9:45 A M Medical Record Number: [id number] Patient Account Number: [id number] Date of Birth/Sex: Treating RN: [birth year] (77 y.o. [name] Primary Care Chanc Kervin: Teressa Lower Other Clinician: Referring Nikira Kushnir: Treating Tashana Haberl/Extender: Jodelle Gross in TreatmentAYNSLEY, [name] ([id number]) [id number] Page 2 of 4 Primary Learner Assessed: Patient Learning Preferences/Education Level/Primary Language Learning Preference: Explanation, Demonstration, Printed Material Highest Education Level: College or Above Preferred Language: Diplomatic Services operational officer Language Barrier: [name] Translator Needed: [name] Memory Deficit: [name] Emotional Barrier: [name] Cultural/Religious Beliefs Affecting Medical Care: [name] Physical Barrier Impaired Vision: Yes Glasses Impaired Hearing: [name] Decreased Hand dexterity: [name] Knowledge/Comprehension Knowledge Level: High Comprehension Level: High Ability to understand written instructions: High Ability to understand verbal instructions: High Motivation Anxiety Level: Calm Cooperation: Cooperative Education Importance: Acknowledges Need Interest in Health Problems: Asks Questions Perception: Coherent Willingness to Engage in Self-Management High Activities: Readiness to Engage in Self-Management High Activities: Electronic Signature(s) Signed: 01/27/2023 4:09:50 PM By: Deon Pilling RN, BSN Entered By: Deon Pilling on 01/26/2023 09:53:47 -------------------------------------------------------------------------------- Fall Risk Assessment Details Patient Name: Date of Service: [name], [name] RMA Long. 01/26/2023 9:45 A M Medical Record Number: [id number] Patient Account  Number: [id number] Date of Birth/Sex: Treating RN: January 16, 1946 (77 y.o. Helene Shoe, Meta.Reding Primary Care Demarie Uhlig: Teressa Lower Other Clinician: Referring Faizaan Falls: Treating Aleaya Latona/Extender: Jodelle Gross in Treatment: 0 Fall Risk Assessment Items Have you had 2 or more falls in the last 12 monthso 0 [name] Have you had any fall that resulted in injury in the last 12 monthso 0 [name] FALLS RISK SCREEN History of falling - immediate or within 3 months 0 [name] Secondary diagnosis (Do you have 2 or more medical diagnoseso) 0 [name] Ambulatory aid None/bed rest/wheelchair/nurse 0 Yes Crutches/cane/walker  0 [name] Furniture 0 [name] Intravenous therapy Access/Saline/Heparin Lock 0 [name] Gait/Transferring Normal/ bed rest/ wheelchair 0 Yes Weak (short steps with or without shuffle, stooped but able to lift head while walking, may seek 0 [name] support from furniture) Impaired (short steps with shuffle, may have difficulty arising from chair, head down, impaired 0 [name] balance) Mental Status Oriented to own ability 0 Yes Overestimates or forgets limitations 0 [name] Risk Level: Low Risk Score: 0 [name], [name] ([id number]) [id number] Page 3 of 4 Electronic Signature(s) -------------------------------------------------------------------------------- Foot Assessment Details Patient Name: Date of Service: [name], [name] RMA Long. 01/26/2023 9:45 A M Medical Record Number: [id number] Patient Account Number: [id number] Date of Birth/Sex: Treating RN: [birth year] (77 y.o. [name] Primary Care Boyd Litaker: Teressa Lower Other Clinician: Referring Lachell Rochette: Treating Anushree Dorsi/Extender: Jodelle Gross in Treatment: 0 Foot Assessment Items Site Locations + = Sensation present, - = Sensation absent, C = Callus, U = Ulcer R = Redness, W = Warmth, M = Maceration, PU = Pre-ulcerative lesion F = Fissure, S = Swelling, D = Dryness Assessment Right: Left: Other  Deformity: [name] [name] Prior Foot Ulcer: [name] [name] Prior Amputation: [name] [name] Charcot Joint: [name] [name] Ambulatory Status: Ambulatory Without Help Gait: Steady Electronic Signature(s) Signed: 01/27/2023 4:09:50 PM By: Deon Pilling RN, BSN Entered By: Deon Pilling on 01/26/2023 10:01:18 -------------------------------------------------------------------------------- Nutrition Risk Screening Details Patient Name: Date of Service: [name], [name] RMA Long. 01/26/2023 9:45 A M Medical Record Number: [id number] Patient Account Number: [id number] Date of Birth/Sex: Treating RN: [birth year] (77 y.o. [name] Primary Care Esiquio Boesen: Teressa Lower Other Clinician: Referring Joani Cosma: Treating Kaydence Menard/Extender: Jodelle Gross in Treatment: 0 Height (in): 63 Weight (lbs): 115 Body Mass Index (BMI): 20.4 [name], [name] ([id number]) [id number] Page 4 of 4 Nutrition Risk Screening Items Score Screening NUTRITION RISK SCREEN: I have an illness or condition that made me change the kind and/or amount of food I eat 2 Yes I eat fewer than two meals per day 0 [name] I eat few fruits and vegetables, or milk products 0 [name] I have three or more drinks of beer, liquor or wine almost every day 0 [name] I have tooth or mouth problems that make it hard for me to eat 0 [name] I don't always have enough money to buy the food I need 0 [name] I eat alone most of the time 0 [name] I take three or more different prescribed or over-the-counter drugs a day 1 Yes Without wanting to, I have lost or gained 10 pounds in the last six months 0 [name] I am not always physically able to shop, cook and/or feed myself 0 [name] Nutrition Protocols Good Risk Protocol Moderate Risk Protocol 0 Provide education on nutrition High Risk Proctocol Risk Level: Moderate Risk Score: 3 Electronic Signature(s) Signed: 01/27/2023 4:09:50 PM By: Deon Pilling RN, BSN Entered By: Deon Pilling on 01/26/2023 09:54:07

## 2023-01-28 NOTE — Progress Notes (Signed)
TAMMATHA, PRIMAVERA ([id number]) [id number] Page 1 of 10 Visit Report for 01/26/2023 Chief Complaint Document Details Patient Name: Date of Service: [name], [name] RMA Long. 01/26/2023 9:45 A M Medical Record Number: [id number] Patient Account Number: [id number] Date of Birth/Sex: Treating RN: 1946/05/15 (77 y.o. F) Primary Care Provider: Teressa Lower Other Clinician: Referring Provider: Treating Provider/Extender: Jodelle Gross in Treatment: 0 Information Obtained from: Patient Chief Complaint Patient seen for complaints of Non-Healing Wound. Electronic Signature(s) Signed: 01/26/2023 10:36:22 AM By: Elaine Maudlin MD FACS Entered By: Elaine Maudlin on 01/26/2023 10:36:21 -------------------------------------------------------------------------------- Debridement Details Patient Name: Date of Service: [name] RMA Long. 01/26/2023 9:45 A M Medical Record Number: [id number] Patient Account Number: [id number] Date of Birth/Sex: Treating RN: 06-10-1946 (77 y.o. [name], [name] Primary Care Provider: Teressa Lower Other Clinician: Referring Provider: Treating Provider/Extender: Jodelle Gross in Treatment: 0 Debridement Performed for Assessment: Wound #4 Left,Anterior Lower Leg Performed By: Physician Elaine Maudlin, MD Debridement Type: Debridement Severity of Tissue Pre Debridement: Fat layer exposed Level of Consciousness (Pre-procedure): Awake and Alert Pre-procedure Verification/Time Out Yes - 10:05 Taken: Start Time: 10:06 Pain Control: Lidocaine 4% T opical Solution T Area Debrided (L x W): otal 3 (cm) x 3.7 (cm) = 11.1 (cm) Tissue and other material debrided: Viable, Non-Viable, Slough, Subcutaneous, Skin: Dermis , Skin: Epidermis, Slough Level: Skin/Subcutaneous Tissue Debridement Description: Excisional Instrument: Curette Bleeding: Minimum Hemostasis Achieved: Pressure End Time: 10:13 Procedural Pain:  0 Post Procedural Pain: 2 Response to Treatment: Procedure was tolerated well Level of Consciousness (Post- Awake and Alert procedure): Post Debridement Measurements of Total Wound Length: (cm) 3 Width: (cm) 3.7 Depth: (cm) 0.1 Volume: (cm) 0.872 Character of Wound/Ulcer Post Debridement: Requires Further Debridement Severity of Tissue Post Debridement: Fat layer exposed Post Procedure Diagnosis Same as Pre-procedure Electronic Signature(s) Signed: 01/26/2023 10:56:24 AM By: Elaine Maudlin MD FACS Signed: 01/27/2023 4:09:50 PM By: Deon Pilling RN, BSN Entered By: Deon Pilling on 01/26/2023 10:14:36 [name], [name] ([id number] Page 2 of 10 -------------------------------------------------------------------------------- HPI Details Patient Name: Date of Service: [name], [name] RMA Long. 01/26/2023 9:45 A M Medical Record Number: [id number] Patient Account Number: [id number] Date of Birth/Sex: Treating RN: November 27, 1945 (77 y.o. F) Primary Care Provider: Teressa Lower Other Clinician: Referring Provider: Treating Provider/Extender: Jodelle Gross in Treatment: 0 History of Present Illness HPI Description: ADMISSION 02/19/2022 This is a 77 year old woman with minimal pertinent medical history. She does have COPD and pulmonary hypertension, but is not diabetic and is not a smoker. About 6 months ago, she and her husband got a new beagle puppy. The puppy scratched her leg and the scratches ultimately deteriorated into ulcerations. Apparently she sought care with a dermatologist who recommended applying a one-to-one mixture of peroxide and water to the wounds followed by a thick layer of Vaseline. She continue this for some time but then saw her primary care provider who told her to discontinue the peroxide. She has not been on any antibiotics for the scratches. Today, there are 3 separate wounds on her anterior tibial surface on the left.  They are tender and her leg has localized swelling. There is yellow slough buildup in each of the wound bases. ABI in clinic today was normal at 1.16. 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 02/25/2022: Over the past week, the wounds themselves have not improved tremendously but she has noticed some stinging and the periwound skin is quite inflamed. I took a culture last week that had  low levels of methicillin-resistant Staph aureus. Because it was fairly low level, I did not prescribe an oral antibiotic. I had planned to change her to mupirocin today, however. We have been using Santyl under Hydrofera Blue with 3 layer compression. 03/04/2022: The wounds have improved quite a bit over the past week. They are less painful and red. She has been taking the prescribed doxycycline but says that it gives her a stomachache. We have been using mupirocin with Hydrofera Blue and 3 layer compression. 03/11/2022: Continued improvement of the wounds. They have contracted quite a bit and have a good surface of healthy granulation tissue present. There is some dried eschar present around all 3 of the lesions. 03/17/2022: Elaine significant change in the wounds from last week. The surface is clean with just a little bit of eschar and slough accumulation. Elaine odor or drainage. Elaine concern for infection. 03/26/2022: All of her wounds are smaller this week. There is good granulation tissue on the surface of each. Elaine slough accumulation. There is just some dry skin around the wounds but not actually involving the wounds. Elaine concern for infection. 04/04/2022: The 2 proximal wounds have healed. The distal wound is much smaller and just has a little bit of dry eschar around the edges. 04/11/2022: Her wound is nearly healed. It is clean without concern for infection. 04/18/2022: Her wound has healed. READMISSION 01/26/2023 About 8 weeks ago, she sustained another scratch from her Beagle on her left anterior  tibial surface.. She has been trying to treat the wound at home with Vaseline. There is thick slough buildup on the wound surface and the periwound skin looks a bit macerated. Elaine overt sign of infection. Electronic Signature(s) Signed: 01/26/2023 10:37:18 AM By: Elaine Maudlin MD FACS Entered By: Elaine Maudlin on 01/26/2023 10:37:18 -------------------------------------------------------------------------------- Physical Exam Details Patient Name: Date of Service: [name] RMA Long. 01/26/2023 9:45 A M Medical Record Number: [id number] Patient Account Number: [id number] Date of Birth/Sex: Treating RN: 1946-10-17 (77 y.o. F) Primary Care Provider: Teressa Lower Other Clinician: Referring Provider: Treating Provider/Extender: Jodelle Gross in Treatment: 0 Constitutional . Slightly tachycardic. . . Elaine acute distress. Respiratory Normal work of breathing on room air. Notes 01/26/2023: On her left anterior tibial surface, there is an irregular wound with areas of granulation tissue and slough. The periwound skin is a little macerated and starting to breakdown. Electronic Signature(s) [name], [name] ([id number]) [id number] Page 3 of 10 Signed: 01/26/2023 10:37:59 AM By: Elaine Maudlin MD FACS Entered By: Elaine Maudlin on 01/26/2023 10:37:59 -------------------------------------------------------------------------------- Physician Orders Details Patient Name: Date of Service: [name] RMA Long. 01/26/2023 9:45 A M Medical Record Number: [id number] Patient Account Number: [id number] Date of Birth/Sex: Treating RN: Mar 20, 1946 (77 y.o. [name] Primary Care Provider: Teressa Lower Other Clinician: Referring Provider: Treating Provider/Extender: Jodelle Gross in Treatment: 0 Verbal / Phone Orders: Elaine Diagnosis Coding ICD-10 Coding Code Description [phone] Non-pressure chronic ulcer of other part of left lower leg  with fat layer exposed I27.20 Pulmonary hypertension, unspecified I51.9 Heart disease, unspecified Follow-up Appointments ppointment in 1 week. - Dr. Celine Ahr Room 2 1000 02/03/2023 Tuesday Return A Anesthetic (In clinic) Topical Lidocaine 4% applied to wound bed Bathing/ Shower/ Hygiene May shower with protection but do not get wound dressing(s) wet. Protect dressing(s) with water repellant cover (for example, large plastic bag) or a cast cover and may then take shower. Edema Control - Lymphedema / SCD / Other Elevate legs to the level of  the heart or above for 30 minutes daily and/or when sitting for 3-4 times a day throughout the day. A void standing for long periods of time. Exercise regularly If compression wraps slide down please call wound center and speak with a nurse. Wound Treatment Wound #4 - Lower Leg Wound Laterality: Left, Anterior Cleanser: Soap and Water 1 x Per Week/30 Days Discharge Instructions: May shower and wash wound with dial antibacterial soap and water prior to dressing change. Cleanser: Vashe 5.8 (oz) 1 x Per Week/30 Days Discharge Instructions: Cleanse the wound with Vashe prior to applying a clean dressing using gauze sponges, not tissue or cotton balls. Peri-Wound Care: Zinc Oxide Ointment 30g tube 1 x Per Week/30 Days Discharge Instructions: Apply Zinc Oxide to periwound with each dressing change Peri-Wound Care: Sween Lotion (Moisturizing lotion) 1 x Per Week/30 Days Discharge Instructions: Apply moisturizing lotion as directed Prim Dressing: IODOFLEX 0.9% Cadexomer Iodine Pad 4x6 cm 1 x Per Week/30 Days ary Discharge Instructions: Apply to wound bed as instructed Secondary Dressing: ABD Pad, 8x10 1 x Per Week/30 Days Discharge Instructions: Apply over primary dressing as directed. Compression Wrap: Urgo K2 Lite, two layer compression system, regular 1 x Per Week/30 Days Discharge Instructions: Apply Urgo K2 Lite as directed (alternative to 3 layer  compression). Patient Medications llergies: Sulfa (Sulfonamide Antibiotics), adhesive, amoxicillin, ciprofloxacin, Statins-Hmg-Coa Reductase Inhibitors A Notifications Medication Indication Start End for any debridement 01/26/2023 lidocaine DOSE topical 4 % cream - cream topical once daily Electronic Signature(s) [name], [name] ([id number]) [id number] Page 4 of 10 Signed: 01/26/2023 10:56:24 AM By: Elaine Maudlin MD FACS Entered By: Elaine Maudlin on 01/26/2023 10:38:10 Prescription 01/26/2023 -------------------------------------------------------------------------------- [name]. Elaine Maudlin MD Patient Name: Provider: 06/26/1946 [id number] Date of Birth: NPI#: [id number] Sex: DEA #: [id number] Phone #: License #: [place] Patient Address: [address], [place] [address] [place], [place] [address] [phone] Allergies Sulfa (Sulfonamide Antibiotics); adhesive; amoxicillin; ciprofloxacin; Statins-Hmg-Coa Reductase Inhibitors Medication Medication: Route: Strength: Form: lidocaine 4 % topical cream topical 4% cream Class: TOPICAL LOCAL ANESTHETICS Dose: Frequency / Time: Indication: cream topical once daily for any debridement Number of Refills: Number of Units: 0 Generic Substitution: Start Date: End Date: One Time Use: Substitution Permitted [id number] Elaine Note to Pharmacy: Hand Signature: Date(s): Electronic Signature(s) Signed: 01/26/2023 10:56:24 AM By: Elaine Maudlin MD FACS Entered By: Elaine Maudlin on 01/26/2023 10:38:10 -------------------------------------------------------------------------------- Problem List Details Patient Name: Date of Service: [name] RMA Long. 01/26/2023 9:45 A M Medical Record Number: [id number] Patient Account Number: [id number] Date of Birth/Sex: Treating RN: October 20, 1946 (77 y.o. F) Primary Care  Provider: Teressa Lower Other Clinician: Referring Provider: Treating Provider/Extender: Jodelle Gross in Treatment: 0 Active Problems ICD-10 Encounter Code Description Active Date MDM Diagnosis [phone] Non-pressure chronic ulcer of other part of left lower leg with fat layer exposed4/10/2022 Elaine Yes I27.20 Pulmonary hypertension, unspecified 01/26/2023 Elaine Yes I51.9 Heart disease, unspecified 01/26/2023 Elaine Yes Inactive Problems [name], [name] ([id number]) [id number] Page 5 of 10 Resolved Problems Electronic Signature(s) Signed: 01/26/2023 10:36:06 AM By: Elaine Maudlin MD FACS Previous Signature: 01/26/2023 9:46:54 AM Version By: Elaine Maudlin MD FACS Entered By: Elaine Maudlin on 01/26/2023 10:36:06 -------------------------------------------------------------------------------- Progress Note Details Patient Name: Date of Service: [name] RMA Long. 01/26/2023 9:45 A M Medical Record Number: [id number] Patient Account Number: [id number] Date of Birth/Sex: Treating RN: [birth year] (77 y.o. F) Primary Care Provider: Teressa Lower Other Clinician: Referring  Provider: Treating Provider/Extender: Jodelle Gross in Treatment: 0 Subjective Chief Complaint Information obtained from Patient Patient seen for complaints of Non-Healing Wound. History of Present Illness (HPI) ADMISSION 02/19/2022 This is a 77 year old woman with minimal pertinent medical history. She does have COPD and pulmonary hypertension, but is not diabetic and is not a smoker. About 6 months ago, she and her husband got a new beagle puppy. The puppy scratched her leg and the scratches ultimately deteriorated into ulcerations. Apparently she sought care with a dermatologist who recommended applying a one-to-one mixture of peroxide and water to the wounds followed by a thick layer of Vaseline. She continue this for some time but then saw her primary care  provider who told her to discontinue the peroxide. She has not been on any antibiotics for the scratches. Today, there are 3 separate wounds on her anterior tibial surface on the left. They are tender and her leg has localized swelling. There is yellow slough buildup in each of the wound bases. ABI in clinic today was normal at 1.16. 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 02/25/2022: Over the past week, the wounds themselves have not improved tremendously but she has noticed some stinging and the periwound skin is quite inflamed. I took a culture last week that had low levels of methicillin-resistant Staph aureus. Because it was fairly low level, I did not prescribe an oral antibiotic. I had planned to change her to mupirocin today, however. We have been using Santyl under Hydrofera Blue with 3 layer compression. 03/04/2022: The wounds have improved quite a bit over the past week. They are less painful and red. She has been taking the prescribed doxycycline but says that it gives her a stomachache. We have been using mupirocin with Hydrofera Blue and 3 layer compression. 03/11/2022: Continued improvement of the wounds. They have contracted quite a bit and have a good surface of healthy granulation tissue present. There is some dried eschar present around all 3 of the lesions. 03/17/2022: Elaine significant change in the wounds from last week. The surface is clean with just a little bit of eschar and slough accumulation. Elaine odor or drainage. Elaine concern for infection. 03/26/2022: All of her wounds are smaller this week. There is good granulation tissue on the surface of each. Elaine slough accumulation. There is just some dry skin around the wounds but not actually involving the wounds. Elaine concern for infection. 04/04/2022: The 2 proximal wounds have healed. The distal wound is much smaller and just has a little bit of dry eschar around the edges. 04/11/2022: Her wound is nearly healed. It  is clean without concern for infection. 04/18/2022: Her wound has healed. READMISSION 01/26/2023 About 8 weeks ago, she sustained another scratch from her Beagle on her left anterior tibial surface.. She has been trying to treat the wound at home with Vaseline. There is thick slough buildup on the wound surface and the periwound skin looks a bit macerated. Elaine overt sign of infection. Patient History Information obtained from Patient, Caregiver. Allergies Sulfa (Sulfonamide Antibiotics) (Reaction: hives), adhesive (Reaction: blisters), amoxicillin (Reaction: unknown), ciprofloxacin (Reaction: unknown), Statins- Hmg-Coa Reductase Inhibitors (Reaction: leg cramps) Family History Unknown History. Social History Former smoker, Marital Status - Married, Alcohol Use - Never, Drug Use - Elaine History, Caffeine Use - Rarely. Medical History Eyes Patient has history of Cataracts - bil removed Denies history of Glaucoma, Optic Neuritis Ear/Nose/Mouth/Throat Denies history of Chronic sinus problems/congestion, Middle ear problems Hematologic/Lymphatic [name], Elaine  Long ([id number] Page 6 of 10 Patient has history of Anemia Respiratory Patient has history of Chronic Obstructive Pulmonary Disease (COPD) Cardiovascular Patient has history of Coronary Artery Disease, Peripheral Venous Disease - varicose veins Endocrine Denies history of Type I Diabetes, Type II Diabetes Genitourinary Denies history of End Stage Renal Disease Immunological Patient has history of Raynaudoos Denies history of Lupus Erythematosus, Scleroderma Integumentary (Skin) Denies history of History of Burn Musculoskeletal Patient has history of Rheumatoid Arthritis, Osteoarthritis Denies history of Gout Oncologic Denies history of Received Chemotherapy, Received Radiation Psychiatric Denies history of Anorexia/bulimia, Confinement Anxiety Hospitalization/Surgery History - bil cataract  removal. - left shoiulder replacement. - right rotator cuff repair. - multiple lumbar spine surgeries. - melanoma removed left arm and chest. - partial excision bone left 2nd toe. Medical A Surgical History Notes nd Hematologic/Lymphatic varicose vein of leg, mixed hyperlipidemia Respiratory dyspnea, chronic bronchitis, pulmonary hypertension, pulmonary emphysema Cardiovascular aortic calcification, tachycardia, left ventricular dysfunction, bradycardia Gastrointestinal Gerd Endocrine prediabetes, hypothyroidism Genitourinary bladder disorder Integumentary (Skin) contact dermatitis Musculoskeletal acquired plantar porokeratosis, neurogenic claudication d/t lumbar spinal stenosis, supraspinatus tendinitis, restless leg syndrome, spinal stenosis of lumbar region Neurologic TIA, insomnia Objective Constitutional Slightly tachycardic. Elaine acute distress. Vitals Time Taken: 9:49 AM, Height: 63 in, Source: Stated, Weight: 115 lbs, Source: Stated, BMI: 20.4, Temperature: 97.9 F, Pulse: 105 bpm, Respiratory Rate: 20 breaths/min, Blood Pressure: 140/70 mmHg. Respiratory Normal work of breathing on room air. General Notes: 01/26/2023: On her left anterior tibial surface, there is an irregular wound with areas of granulation tissue and slough. The periwound skin is a little macerated and starting to breakdown. Integumentary (Hair, Skin) Wound #4 status is Open. Original cause of wound was Trauma. The date acquired was: 11/27/2022. The wound is located on the Left,Anterior Lower Leg. The wound measures 3cm length x 3.7cm width x 0.1cm depth; 8.718cm^2 area and 0.872cm^3 volume. There is Fat Layer (Subcutaneous Tissue) exposed. There is Elaine tunneling or undermining noted. There is a medium amount of serosanguineous drainage noted. The wound margin is distinct with the outline attached to the wound base. There is small (1-33%) red, pink granulation within the wound bed. There is a large (67-100%)  amount of necrotic tissue within the wound bed including Adherent Slough. The periwound skin appearance exhibited: Maceration, Hemosiderin Staining, Erythema. The periwound skin appearance did not exhibit: Callus, Crepitus, Excoriation, Induration, Rash, Scarring, Dry/Scaly, Atrophie Blanche, Cyanosis, Ecchymosis, Mottled, Pallor, Rubor. The surrounding wound skin color is noted with erythema which is circumferential. Periwound temperature was noted as Elaine Abnormality. Assessment Active Problems ICD-10 Non-pressure chronic ulcer of other part of left lower leg with fat layer exposed [name], [name] ([id number]) [id number] Page 7 of 10 Pulmonary hypertension, unspecified Heart disease, unspecified Procedures Wound #4 Pre-procedure diagnosis of Wound #4 is a Venous Leg Ulcer located on the Left,Anterior Lower Leg .Severity of Tissue Pre Debridement is: Fat layer exposed. There was a Excisional Skin/Subcutaneous Tissue Debridement with a total area of 11.1 sq cm performed by Elaine Maudlin, MD. With the following instrument(s): Curette to remove Viable and Non-Viable tissue/material. Material removed includes Subcutaneous Tissue, Slough, Skin: Dermis, and Skin: Epidermis after achieving pain control using Lidocaine 4% Topical Solution. A time out was conducted at 10:05, prior to the start of the procedure. A Minimum amount of bleeding was controlled with Pressure. The procedure was tolerated well with a pain level of 0 throughout and a pain level of 2 following the procedure. Post Debridement Measurements: 3cm length x 3.7cm width x  0.1cm depth; 0.872cm^3 volume. Character of Wound/Ulcer Post Debridement requires further debridement. Severity of Tissue Post Debridement is: Fat layer exposed. Post procedure Diagnosis Wound #4: Same as Pre-Procedure Pre-procedure diagnosis of Wound #4 is a Venous Leg Ulcer located on the Left,Anterior Lower Leg . There was a Double Layer  Compression Therapy Procedure by Deon Pilling, RN. Post procedure Diagnosis Wound #4: Same as Pre-Procedure Plan Follow-up Appointments: Return Appointment in 1 week. - Dr. Celine Ahr Room 2 1000 02/03/2023 Tuesday Anesthetic: (In clinic) Topical Lidocaine 4% applied to wound bed Bathing/ Shower/ Hygiene: May shower with protection but do not get wound dressing(s) wet. Protect dressing(s) with water repellant cover (for example, large plastic bag) or a cast cover and may then take shower. Edema Control - Lymphedema / SCD / Other: Elevate legs to the level of the heart or above for 30 minutes daily and/or when sitting for 3-4 times a day throughout the day. Avoid standing for long periods of time. Exercise regularly If compression wraps slide down please call wound center and speak with a nurse. The following medication(s) was prescribed: lidocaine topical 4 % cream cream topical once daily for for any debridement was prescribed at facility WOUND #4: - Lower Leg Wound Laterality: Left, Anterior Cleanser: Soap and Water 1 x Per Week/30 Days Discharge Instructions: May shower and wash wound with dial antibacterial soap and water prior to dressing change. Cleanser: Vashe 5.8 (oz) 1 x Per Week/30 Days Discharge Instructions: Cleanse the wound with Vashe prior to applying a clean dressing using gauze sponges, not tissue or cotton balls. Peri-Wound Care: Zinc Oxide Ointment 30g tube 1 x Per Week/30 Days Discharge Instructions: Apply Zinc Oxide to periwound with each dressing change Peri-Wound Care: Sween Lotion (Moisturizing lotion) 1 x Per Week/30 Days Discharge Instructions: Apply moisturizing lotion as directed Prim Dressing: IODOFLEX 0.9% Cadexomer Iodine Pad 4x6 cm 1 x Per Week/30 Days ary Discharge Instructions: Apply to wound bed as instructed Secondary Dressing: ABD Pad, 8x10 1 x Per Week/30 Days Discharge Instructions: Apply over primary dressing as directed. Com pression Wrap: Urgo K2  Lite, two layer compression system, regular 1 x Per Week/30 Days Discharge Instructions: Apply Urgo K2 Lite as directed (alternative to 3 layer compression). 01/26/2023: She returns today with another wound secondary to being scratched by her dog. On her left anterior tibial surface, there is an irregular wound with areas of granulation tissue and slough. The periwound skin is a little macerated and starting to breakdown. I used a curette to debride slough and subcutaneous tissue from her wound. I think this wound would benefit from additional chemical debridement during the week so we will apply Iodoflex. We will also use 3 layer compression. She will follow-up in 1 week. Electronic Signature(s) Signed: 01/26/2023 10:39:00 AM By: Elaine Maudlin MD FACS Entered By: Elaine Maudlin on 01/26/2023 10:39:00 -------------------------------------------------------------------------------- HxROS Details Patient Name: Date of Service: [name], Elaine RMA Long. 01/26/2023 9:45 A M Medical Record Number: [id number] Patient Account Number: [id number] Date of Birth/Sex: Treating RN: 1945/12/25 (77 y.o. Eurika, Schambach, Inice Long ([id number]) [id number] Page 8 of 10 Primary Care Provider: Teressa Lower Other Clinician: Referring Provider: Treating Provider/Extender: Jodelle Gross in Treatment: 0 Information Obtained From Patient Caregiver Eyes Medical History: Positive for: Cataracts - bil removed Negative for: Glaucoma; Optic Neuritis Ear/Nose/Mouth/Throat Medical History: Negative for: Chronic sinus problems/congestion; Middle ear problems Hematologic/Lymphatic Medical History: Positive for: Anemia Past Medical History Notes: varicose vein of leg, mixed hyperlipidemia Respiratory Medical History: Positive  for: Chronic Obstructive Pulmonary Disease (COPD) Past Medical History Notes: dyspnea, chronic bronchitis, pulmonary hypertension, pulmonary  emphysema Cardiovascular Medical History: Positive for: Coronary Artery Disease; Peripheral Venous Disease - varicose veins Past Medical History Notes: aortic calcification, tachycardia, left ventricular dysfunction, bradycardia Gastrointestinal Medical History: Past Medical History Notes: Gerd Endocrine Medical History: Negative for: Type I Diabetes; Type II Diabetes Past Medical History Notes: prediabetes, hypothyroidism Genitourinary Medical History: Negative for: End Stage Renal Disease Past Medical History Notes: bladder disorder Immunological Medical History: Positive for: Raynauds Negative for: Lupus Erythematosus; Scleroderma Integumentary (Skin) Medical History: Negative for: History of Burn Past Medical History Notes: contact dermatitis Musculoskeletal Medical History: Positive for: Rheumatoid Arthritis; Osteoarthritis Negative for: Gout Past Medical History Notes: acquired plantar porokeratosis, neurogenic claudication d/t lumbar spinal stenosis, supraspinatus tendinitis, restless leg syndrome, spinal stenosis of lumbar region TEDDY, FAIRWEATHER Long ([id number] Page 9 of 10 Neurologic Medical History: Past Medical History Notes: TIA, insomnia Oncologic Medical History: Negative for: Received Chemotherapy; Received Radiation Psychiatric Medical History: Negative for: Anorexia/bulimia; Confinement Anxiety HBO Extended History Items Eyes: Cataracts Immunizations Pneumococcal Vaccine: Received Pneumococcal Vaccination: Yes Received Pneumococcal Vaccination On or After 60th Birthday: Yes Implantable Devices None Hospitalization / Surgery History Type of Hospitalization/Surgery bil cataract removal left shoiulder replacement right rotator cuff repair multiple lumbar spine surgeries melanoma removed left arm and chest partial excision bone left 2nd toe Family and Social History Unknown History: Yes; Former smoker;  Marital Status - Married; Alcohol Use: Never; Drug Use: Elaine History; Caffeine Use: Rarely; Financial Concerns: Elaine; Food, Clothing or Shelter Needs: Elaine; Support System Lacking: Elaine; Transportation Concerns: Elaine Electronic Signature(s) Signed: 01/26/2023 10:56:24 AM By: Elaine Maudlin MD FACS Signed: 01/28/2023 4:24:30 PM By: Rhae Hammock RN Entered By: Rhae Hammock on 01/23/2023 10:36:52 -------------------------------------------------------------------------------- SuperBill Details Patient Name: Date of Service: [name] RMA Long. 01/26/2023 Medical Record Number: [id number] Patient Account Number: [id number] Date of Birth/Sex: Treating RN: 1946-05-24 (77 y.o. F) Primary Care Provider: Teressa Lower Other Clinician: Referring Provider: Treating Provider/Extender: Jodelle Gross in Treatment: 0 Diagnosis Coding ICD-10 Codes Code Description [id number] Non-pressure chronic ulcer of other part of left lower leg with fat layer exposed I27.20 Pulmonary hypertension, unspecified I51.9 Heart disease, unspecified Facility Procedures : LARUTH, MUSANTE Code: [id number] RMA Long ([id number]) Description: 11042 - DEB SUBQ TISSUE 20 SQ CM/< ICD-10 Diagnosis Description L97.822 Non-pressure chronic ulcer of other part of left lower leg with fat layer expo [phone] Modifier: sed [id number] Quantity: 1 27.pdf Page 10 of 10 Physician Procedures : CPT4 Code Description Modifier [id number] - WC PHYS LEVEL 4 - EST PT 25 ICD-10 Diagnosis Description L97.822 Non-pressure chronic ulcer of other part of left lower leg with fat layer exposed I27.20 Pulmonary hypertension, unspecified I51.9 Heart  disease, unspecified Quantity: 1 : [id number] - WC PHYS SUBQ TISS 20 SQ CM ICD-10 Diagnosis Description L97.822 Non-pressure chronic ulcer of other part of left lower leg with fat layer exposed Quantity: 1 Electronic Signature(s) Signed: 01/26/2023 10:39:14 AM By: Elaine Maudlin  MD FACS Entered By: Elaine Maudlin on 01/26/2023 10:39:14

## 2023-01-28 NOTE — Progress Notes (Signed)
BHAKTI, WOJNO ([id number]) [id number] Page 1 of 10 Visit Report for 01/26/2023 Allergy List Details Patient Name: Date of Service: [name], [name] RMA Long. 01/26/2023 9:45 A M Medical Record Number: [id number] Patient Account Number: [id number] Date of Birth/Sex: Treating RN: [name][birth year] (77 y.o. [name], [name] Primary Care [name]: [name] Other Clinician: Referring [name]: Treating [name]/Extender: [name] in Treatment: 0 Allergies Active Allergies Sulfa (Sulfonamide Antibiotics) Reaction: hives adhesive Reaction: blisters amoxicillin Reaction: unknown ciprofloxacin Reaction: unknown Statins-Hmg-Coa Reductase Inhibitors Reaction: leg cramps Allergy Notes Electronic Signature(s) Signed: 01/28/2023 4:24:30 PM By: Elaine Hammock RN Entered By: [name] on 01/23/2023 10:36:45 -------------------------------------------------------------------------------- Arrival Information Details Patient Name: Date of Service: [name] RMA Long. 01/26/2023 9:45 A M Medical Record Number: [id number] Patient Account Number: [id number] Date of Birth/Sex: Treating RN: [name][birth year] (77 y.o. [name] Primary Care Zaylei Mullane: [name] Other Clinician: Referring Jorgia Manthei: Treating [name]/Extender: [name] in Treatment: 0 Visit Information Patient Arrived: Ambulatory Arrival Time: 09:48 Accompanied By: husband Transfer Assistance: None Patient Identification Verified: Yes Secondary Verification Process Completed: Yes Patient Requires Transmission-Based Precautions: Elaine Patient Has Alerts: Yes Patient Alerts: ABI L 1.16 02/19/22 History Since Last Visit Added or deleted any medications: Elaine Any new allergies or adverse reactions: Elaine Had a fall or experienced change in activities of daily living that may affect risk of falls: Elaine Signs or symptoms of abuse/neglect since last visito  Elaine Electronic Signature(s) Signed: 01/27/2023 4:09:50 PM By: Elaine Pilling RN, BSN Entered By: [name] on 01/26/2023 10:01:58 -------------------------------------------------------------------------------- Clinic Level of Care Assessment Details Patient Name: Date of Service: [name], Elaine RMA Long. 01/26/2023 9:45 A M Medical Record Number: [id number] Patient Account Number: [id number] Elaine, Long ([id number]) [phone].pdf Page 2 of 10 Date of Birth/Sex: Treating RN: [name][birth year] (77 y.o. [name] Primary Care [name]: Other Clinician: Teressa Long Referring [name]: Treating [name]/Extender: [name] in Treatment: 0 Clinic Level of Care Assessment Items TOOL 1 Quantity Score X- 1 0 Use when EandM and Procedure is performed on INITIAL visit ASSESSMENTS - Nursing Assessment / Reassessment X- 1 20 General Physical Exam (combine w/ comprehensive assessment (listed just below) when performed on new pt. evals) X- 1 25 Comprehensive Assessment (HX, ROS, Risk Assessments, Wounds Hx, etc.) ASSESSMENTS - Wound and Skin Assessment / Reassessment X- 1 10 Dermatologic / Skin Assessment (not related to wound area) ASSESSMENTS - Ostomy and/or Continence Assessment and Care []  - 0 Incontinence Assessment and Management []  - 0 Ostomy Care Assessment and Management (repouching, etc.) PROCESS - Coordination of Care X - Simple Patient / Family Education for ongoing care 1 15 []  - 0 Complex (extensive) Patient / Family Education for ongoing care X- 1 10 Staff obtains Programmer, systems, Records, T Results / Process Orders est []  - 0 Staff telephones HHA, Nursing Homes / Clarify orders / etc []  - 0 Routine Transfer to another Facility (non-emergent condition) []  - 0 Routine Hospital Admission (non-emergent condition) X- 1 15 New Admissions / Biomedical engineer / Ordering NPWT Apligraf, etc. , []  - 0 Emergency Hospital Admission  (emergent condition) PROCESS - Special Needs []  - 0 Pediatric / Minor Patient Management []  - 0 Isolation Patient Management []  - 0 Hearing / Language / Visual special needs []  - 0 Assessment of Community assistance (transportation, D/C planning, etc.) []  - 0 Additional assistance / Altered mentation []  - 0 Support Surface(s) Assessment (bed, cushion, seat, etc.) INTERVENTIONS - Miscellaneous []  - 0 External ear exam []  -  0 Patient Transfer (multiple staff / Civil Service fast streamer / Similar devices) []  - 0 Simple Staple / Suture removal (25 or less) []  - 0 Complex Staple / Suture removal (26 or more) []  - 0 Hypo/Hyperglycemic Management (do not check if billed separately) []  - 0 Ankle / Brachial Index (ABI) - do not check if billed separately Has the patient been seen at the hospital within the last three years: Yes Total Score: 95 Level Of Care: New/Established - Level 3 Electronic Signature(s) Signed: 01/27/2023 4:09:50 PM By: Elaine Pilling RN, BSN Entered By: [name] on 01/26/2023 10:17:44 -------------------------------------------------------------------------------- Compression Therapy Details Patient Name: Date of Service: [name] RMA Long. 01/26/2023 9:45 A M Medical Record Number: [id number] Patient Account Number: [id number] Elaine, Long ([id number]) [phone].pdf Page 3 of 10 Date of Birth/Sex: Treating RN: [name][birth year] (77 y.o. [name] Primary Care [name]: Other Clinician: Teressa Long Referring [name]: Treating [name]/Extender: [name] in Treatment: 0 Compression Therapy Performed for Wound Assessment: Wound #4 Left,Anterior Long Leg Performed By: Clinician Elaine Pilling, RN Compression Type: Double Layer Post Procedure Diagnosis Same as Pre-procedure Electronic Signature(s) Signed: 01/27/2023 4:09:50 PM By: Elaine Pilling RN, BSN Entered By: [name] on 01/26/2023  10:14:51 -------------------------------------------------------------------------------- Encounter Discharge Information Details Patient Name: Date of Service: [name] RMA Long. 01/26/2023 9:45 A M Medical Record Number: [id number] Patient Account Number: [id number] Date of Birth/Sex: Treating RN: [birth year] (77 y.o. [name] Primary Care Jakeya Gherardi: [name] Other Clinician: Referring Derwood Becraft: Treating Boden Stucky/Extender: [name] in Treatment: 0 Encounter Discharge Information Items Post Procedure Vitals Discharge Condition: Stable Temperature (F): 97.9 Ambulatory Status: Ambulatory Pulse (bpm): 105 Discharge Destination: Home Respiratory Rate (breaths/min): 20 Transportation: Private Auto Blood Pressure (mmHg): 140/70 Accompanied By: husband Schedule Follow-up Appointment: Yes Clinical Summary of Care: Electronic Signature(s) Signed: 01/27/2023 4:09:50 PM By: Elaine Pilling RN, BSN Entered By: [name] on 01/26/2023 10:18:30 -------------------------------------------------------------------------------- Long Extremity Assessment Details Patient Name: Date of Service: Stencel, Elaine RMA Long. 01/26/2023 9:45 A M Medical Record Number: [id number] Patient Account Number: [id number] Date of Birth/Sex: Treating RN: [name][birth year] (77 y.o. [name] Primary Care Neema Barreira: [name] Other Clinician: Referring Lynne Righi: Treating Wanisha Shiroma/Extender: [name] in Treatment: 0 Edema Assessment Assessed: Shirlyn Goltz: Yes] Patrice Paradise: Elaine] Edema: [Left: N] [Right: o] Calf Left: Right: Point of Measurement: 25 cm From Medial Instep 30.5 cm Ankle Left: Right: Point of Measurement: 10 cm From Medial Instep 18.5 cm Knee To Floor Left: Right: From Medial Instep 37 cm Vascular Assessment Left: [id number] Page 4 of 10Right:] Pulses: Dorsalis Pedis Palpable: [id number] Page 4  of 10Yes] Posterior Tibial Palpable: [id number] Page 4 of 10Yes] Electronic Signature(s) Signed: 01/27/2023 4:09:50 PM By: Elaine Pilling RN, BSN Entered By: [name] on 01/26/2023 10:01:26 -------------------------------------------------------------------------------- Multi Wound Chart Details Patient Name: Date of Service: [name] RMA Long. 01/26/2023 9:45 A M Medical Record Number: [id number] Patient Account Number: [id number] Date of Birth/Sex: Treating RN: [birth year] (77 y.o. F) Primary Care Verniece Encarnacion: [name] Other Clinician: Referring Brigette Hopfer: Treating Damonie Ellenwood/Extender: [name] in Treatment: 0 Vital Signs Height(in): 63 Pulse(bpm): 105 Weight(lbs): 115 Blood Pressure(mmHg): 140/70 Body Mass Index(BMI): 20.4 Temperature(F): 97.9 Respiratory Rate(breaths/min): 20 [4:Photos:] [N/A:N/A] Left, Anterior Long Leg N/A N/A Wound Location: Trauma N/A N/A Wounding Event: Venous Leg Ulcer N/A N/A Primary Etiology: Cataracts, Anemia, Chronic N/A N/A Comorbid History: Obstructive Pulmonary Disease (COPD), Coronary Artery Disease, Peripheral Venous Disease, Raynauds, Rheumatoid Arthritis, Osteoarthritis 11/27/2022 N/A N/A Date Acquired: 0  N/A N/A Weeks of Treatment: Open N/A N/A Wound Status: Elaine N/A N/A Wound Recurrence: 3x3.7x0.1 N/A N/A Measurements L x W x D (cm) 8.718 N/A N/A A (cm) : rea 0.872 N/A N/A Volume (cm) : Full Thickness Without Exposed N/A N/A Classification: Support Structures Medium N/A N/A Exudate A mount: Serosanguineous N/A N/A Exudate Type: red, brown N/A N/A Exudate Color: Distinct, outline attached N/A N/A Wound Margin: Small (1-33%) N/A N/A Granulation A mount: Red, Pink N/A N/A Granulation Quality: Large (67-100%) N/A N/A Necrotic A mount: Fat Layer (Subcutaneous Tissue): Yes N/A N/A Exposed Structures: Fascia: Elaine Tendon: Elaine Muscle: Elaine Joint: Elaine Bone:  Elaine Small (1-33%) N/A N/A Epithelialization: Debridement - Excisional N/A N/A Debridement: Pre-procedure Verification/Time Out 10:05 N/A N/A Taken: Lidocaine 4% Topical Solution N/A N/A Pain Control: Subcutaneous, Slough N/A N/A Tissue DebridedMARQUISE, [name] ([id number] Page 5 of 10 Skin/Subcutaneous Tissue N/A N/A Level: 11.1 N/A N/A Debridement A (sq cm): rea Curette N/A N/A Instrument: Minimum N/A N/A Bleeding: Pressure N/A N/A Hemostasis Achieved: 0 N/A N/A Procedural Pain: 2 N/A N/A Post Procedural Pain: Procedure was tolerated well N/A N/A Debridement Treatment Response: 3x3.7x0.1 N/A N/A Post Debridement Measurements L x W x D (cm) 0.872 N/A N/A Post Debridement Volume: (cm) Excoriation: Elaine N/A N/A Periwound Skin Texture: Induration: Elaine Callus: Elaine Crepitus: Elaine Rash: Elaine Scarring: Elaine Maceration: Yes N/A N/A Periwound Skin Moisture: Dry/Scaly: Elaine Erythema: Yes N/A N/A Periwound Skin Color: Hemosiderin Staining: Yes Atrophie Blanche: Elaine Cyanosis: Elaine Ecchymosis: Elaine Mottled: Elaine Pallor: Elaine Rubor: Elaine Circumferential N/A N/A Erythema Location: Elaine Abnormality N/A N/A Temperature: Compression Therapy N/A N/A Procedures Performed: Debridement Treatment Notes Wound #4 (Long Leg) Wound Laterality: Left, Anterior Cleanser Soap and Water Discharge Instruction: May shower and wash wound with dial antibacterial soap and water prior to dressing change. Vashe 5.8 (oz) Discharge Instruction: Cleanse the wound with Vashe prior to applying a clean dressing using gauze sponges, not tissue or cotton balls. Peri-Wound Care Zinc Oxide Ointment 30g tube Discharge Instruction: Apply Zinc Oxide to periwound with each dressing change Sween Lotion (Moisturizing lotion) Discharge Instruction: Apply moisturizing lotion as directed Topical Primary Dressing IODOFLEX 0.9% Cadexomer Iodine Pad 4x6 cm Discharge Instruction: Apply to  wound bed as instructed Secondary Dressing ABD Pad, 8x10 Discharge Instruction: Apply over primary dressing as directed. Secured With Compression Wrap Urgo K2 Lite, two layer compression system, regular Discharge Instruction: Apply Urgo K2 Lite as directed (alternative to 3 layer compression). Compression Stockings Add-Ons Electronic Signature(s) Signed: 01/26/2023 10:36:15 AM By: Fredirick Maudlin MD FACS Entered By: Fredirick Maudlin on 01/26/2023 10:36:15 Multi-Disciplinary Care Plan Details -------------------------------------------------------------------------------- [name] ([id number].pdf Page 6 of 10 Patient Name: Date of Service: [name], Elaine RMA Long. 01/26/2023 9:45 A M Medical Record Number: [id number] Patient Account Number: [id number] Date of Birth/Sex: Treating RN: [birth year] [name] (77 y.o. Helene Shoe, Tammi Klippel Primary Care Martell Mcfadyen: [name] Other Clinician: Referring Mariel Lukins: Treating Dianca Owensby/Extender: [name] in Treatment: 0 Active Inactive Nutrition Nursing Diagnoses: Potential for alteratiion in Nutrition/Potential for imbalanced nutrition Goals: Patient/caregiver agrees to and verbalizes understanding of need to obtain nutritional consultation Date Initiated: 01/26/2023 Target Resolution Date: 03/06/2023 Goal Status: Active Patient/caregiver will maintain therapeutic glucose control Date Initiated: 01/26/2023 Target Resolution Date: 03/06/2023 Goal Status: Active Interventions: Assess patient nutrition upon admission and as needed per policy Provide education on nutrition Treatment Activities: Giving encouragement to exercise : 01/26/2023 Notes: Pain, Acute or Chronic Nursing Diagnoses: Pain, acute or chronic: actual or potential Potential  alteration in comfort, pain Goals: Patient will verbalize adequate pain control and receive pain control interventions during procedures as needed Date Initiated:  01/26/2023 Target Resolution Date: 03/06/2023 Goal Status: Active Patient/caregiver will verbalize comfort level met Date Initiated: 01/26/2023 Target Resolution Date: 03/06/2023 Goal Status: Active Interventions: Complete pain assessment as per visit requirements Encourage patient to take pain medications as prescribed Provide education on pain management Treatment Activities: Administer pain control measures as ordered : 01/26/2023 Notes: Wound/Skin Impairment Nursing Diagnoses: Knowledge deficit related to ulceration/compromised skin integrity Goals: Patient/caregiver will verbalize understanding of skin care regimen Date Initiated: 01/26/2023 Target Resolution Date: 03/06/2023 Goal Status: Active Interventions: Assess patient/caregiver ability to perform ulcer/skin care regimen upon admission and as needed Assess ulceration(s) every visit Provide education on ulcer and skin care Treatment Activities: Skin care regimen initiated : 01/26/2023 Topical wound management initiated : 01/26/2023 Notes: [name], [name] ([id number]) [phone].pdf Page 7 of 10 Electronic Signature(s) Signed: 01/27/2023 4:09:50 PM By: Elaine Pilling RN, BSN Entered By: [name] on 01/26/2023 10:07:26 -------------------------------------------------------------------------------- Pain Assessment Details Patient Name: Date of Service: [name] RMA Long. 01/26/2023 9:45 A M Medical Record Number: [id number] Patient Account Number: [id number] Date of Birth/Sex: Treating RN: [name]-03-Long (77 y.o. [name] Primary Care Shayra Anton: [name] Other Clinician: Referring Jazzlyn Huizenga: Treating Margarethe Virgen/Extender: [name] in Treatment: 0 Active Problems Location of Pain Severity and Description of Pain Patient Has Paino Yes Site Locations Pain Location: Pain in Ulcers Rate the pain. Current Pain Level: 3 Pain Management and Medication Current Pain  Management: Medication: Elaine Cold Application: Elaine Rest: Elaine Massage: Elaine Activity: Elaine T.E.N.S.: Elaine Heat Application: Elaine Leg drop or elevation: Elaine Is the Current Pain Management Adequate: Adequate How does your wound impact your activities of daily livingo Sleep: Elaine Bathing: Elaine Appetite: Elaine Relationship With Others: Elaine Bladder Continence: Elaine Emotions: Elaine Bowel Continence: Elaine Work: Elaine Toileting: Elaine Drive: Elaine Dressing: Elaine Hobbies: Elaine Engineer, maintenance) Signed: 01/27/2023 4:09:50 PM By: Elaine Pilling RN, BSN Entered By: [name] on 01/26/2023 09:50:05 -------------------------------------------------------------------------------- Patient/Caregiver Education Details Patient Name: Date of Service: [name] RMA Long. 4/1/2024andnbsp9:45 A M Medical Record Number: [id number] Patient Account Number: [id number] Date of Birth/Gender: Treating RN: [birth year] (77 y.o. [name] Primary Care Physician: [name] Other Clinician: Referring Physician: Treating Physician/Extender: [name] in Treatment: 0 Education [address] [place], [place]stance Holster Long ([id number]) [id number] Page 8 of 10 Education Provided To: Patient Education Topics Provided Welcome [facility] Packet: o Handouts: Welcome T [facility] [place] o Methods: Explain/Verbal Responses: Reinforcements needed Wound/Skin Impairment: Handouts: Caring for Your Ulcer Methods: Explain/Verbal Responses: Reinforcements needed Electronic Signature(s) Signed: 01/27/2023 4:09:50 PM By: Elaine Pilling RN, BSN Entered By: [name] on 01/26/2023 10:07:39 -------------------------------------------------------------------------------- Wound Assessment Details Patient Name: Date of Service: [name] RMA Long. 01/26/2023 9:45 A M Medical Record Number: [id number] Patient Account Number: [id number] Date of Birth/Sex: Treating RN: [birth year], [name] (77 y.o. Helene Shoe, Meta.Reding Primary Care Treveon Bourcier: [name] Other Clinician: Referring Ignace Mandigo: Treating Carisha Kantor/Extender: [name] in Treatment: 0 Wound Status Wound Number: 4 Primary Venous Leg Ulcer Etiology: Wound Location: Left, Anterior Long Leg Wound Open Wounding Event: Trauma Status: Date Acquired: 11/27/2022 Comorbid Cataracts, Anemia, Chronic Obstructive Pulmonary Disease Weeks Of Treatment: 0 History: (COPD), Coronary Artery Disease, Peripheral Venous Disease, Clustered Wound: Elaine Raynauds, Rheumatoid Arthritis, Osteoarthritis Photos Wound Measurements Length: (cm) 3 Width: (cm) 3.7 Depth: (cm) 0.1 Area: (cm) 8.718 Volume: (cm) 0.872 %  Reduction in Area: % Reduction in Volume: Epithelialization: Small (1-33%) Tunneling: Elaine Undermining: Elaine Wound Description Classification: Full Thickness Without Exposed Suppor Wound Margin: Distinct, outline attached Exudate Amount: Medium Exudate Type: Serosanguineous Exudate Color: red, brown t Structures Foul Odor After Cleansing: Elaine Slough/Fibrino Yes Wound Bed Granulation Amount: Small (1-33%) Exposed Structure Granulation Quality: Red, Pink Fascia Exposed: Elaine Necrotic Amount: Large (67-100%) Fat Layer (Subcutaneous Tissue) ExposedMADASYN, [name] ([id number].pdf Page 9 of 10 Necrotic Quality: Adherent Slough Tendon Exposed: Elaine Muscle Exposed: Elaine Joint Exposed: Elaine Bone Exposed: Elaine Periwound Skin Texture Texture Color Elaine Abnormalities Noted: Elaine Elaine Abnormalities Noted: Elaine Callus: Elaine Atrophie Blanche: Elaine Crepitus: Elaine Cyanosis: Elaine Excoriation: Elaine Ecchymosis: Elaine Induration: Elaine Erythema: Yes Rash: Elaine Erythema Location: Circumferential Scarring: Elaine Hemosiderin Staining: Yes Mottled: Elaine Moisture Pallor: Elaine Elaine Abnormalities Noted: Elaine Rubor: Elaine Dry / Scaly: Elaine Maceration: Yes Temperature / Pain Temperature: Elaine Abnormality Treatment Notes Wound #4  (Long Leg) Wound Laterality: Left, Anterior Cleanser Soap and Water Discharge Instruction: May shower and wash wound with dial antibacterial soap and water prior to dressing change. Vashe 5.8 (oz) Discharge Instruction: Cleanse the wound with Vashe prior to applying a clean dressing using gauze sponges, not tissue or cotton balls. Peri-Wound Care Zinc Oxide Ointment 30g tube Discharge Instruction: Apply Zinc Oxide to periwound with each dressing change Sween Lotion (Moisturizing lotion) Discharge Instruction: Apply moisturizing lotion as directed Topical Primary Dressing IODOFLEX 0.9% Cadexomer Iodine Pad 4x6 cm Discharge Instruction: Apply to wound bed as instructed Secondary Dressing ABD Pad, 8x10 Discharge Instruction: Apply over primary dressing as directed. Secured With Compression Wrap Urgo K2 Lite, two layer compression system, regular Discharge Instruction: Apply Urgo K2 Lite as directed (alternative to 3 layer compression). Compression Stockings Add-Ons Electronic Signature(s) Signed: 01/27/2023 4:09:50 PM By: Elaine Pilling RN, BSN Entered By: [name] on 01/26/2023 10:04:27 -------------------------------------------------------------------------------- Vitals Details Patient Name: Date of Service: [name] RMA Long. 01/26/2023 9:45 A M Medical Record Number: [id number] Patient Account Number: [id number] Date of Birth/Sex: Treating RN: [name], [name] (77 y.o. [name] Primary Care Rachelanne Whidby: [name] Other Clinician: Referring [name]: Treating Mayvis Agudelo/Extender: [name] in Treatment: 0 Vital Signs [name], [name] ([id number]) [id number] Page 10 of 10 Time Taken: 09:49 Temperature (F): 97.9 Height (in): 63 Pulse (bpm): 105 Source: Stated Respiratory Rate (breaths/min): 20 Weight (lbs): 115 Blood Pressure (mmHg): 140/70 Source: Stated Reference Range: 80 - 120 mg / dl Body Mass Index (BMI):  20.4 Electronic Signature(s) Signed: 01/27/2023 4:09:50 PM By: Elaine Pilling RN, BSN Entered By: [name] on 01/26/2023 09:49:31

## 2023-01-29 ENCOUNTER — Encounter (HOSPITAL_BASED_OUTPATIENT_CLINIC_OR_DEPARTMENT_OTHER): Payer: PPO | Admitting: General Surgery

## 2023-01-29 DIAGNOSIS — L97822 Non-pressure chronic ulcer of other part of left lower leg with fat layer exposed: Secondary | ICD-10-CM | POA: Diagnosis not present

## 2023-01-29 NOTE — Progress Notes (Signed)
[name] ([id number]) [id number] Page 1 of 8 Visit Report for 01/29/2023 Chief Complaint Document Details Patient Name: Date of Service: [name], [name] [name] 01/29/2023 10:30 A M Medical Record Number: [id number] Patient Account Number: [id number] Date of Birth/Sex: Treating RN: [birth year] (77 y.o. F) Primary Care Provider: Teressa Lower Other Clinician: Referring Provider: Treating Provider/Extender: Jodelle Gross in Treatment: 0 Information Obtained from: Patient Chief Complaint Patient seen for complaints of Non-Healing Wound. Electronic Signature(s) Signed: 01/29/2023 12:12:11 PM By: Fredirick Maudlin MD FACS Entered By: Fredirick Maudlin on 01/29/2023 12:12:11 -------------------------------------------------------------------------------- HPI Details Patient Name: Date of Service: [name] [name] 01/29/2023 10:30 A M Medical Record Number: [id number] Patient Account Number: [id number] Date of Birth/Sex: Treating RN: 02-21-1946 (77 y.o. F) Primary Care Provider: Teressa Lower Other Clinician: Referring Provider: Treating Provider/Extender: Jodelle Gross in Treatment: 0 History of Present Illness HPI Description: ADMISSION 02/19/2022 This is a 77 year old woman with minimal pertinent medical history. She does have COPD and pulmonary hypertension, but is not diabetic and is not a smoker. About 6 months ago, she and her husband got a new beagle puppy. The puppy scratched her leg and the scratches ultimately deteriorated into ulcerations. Apparently she sought care with a dermatologist who recommended applying a one-to-one mixture of peroxide and water to the wounds followed by a thick layer of Vaseline. She continue this for some time but then saw her primary care provider who told her to discontinue the peroxide. She has not been on any antibiotics for the scratches. Today, there are 3 separate wounds on her anterior  tibial surface on the left. They are tender and her leg has localized swelling. There is yellow slough buildup in each of the wound bases. ABI in clinic today was normal at 1.16. 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 02/25/2022: Over the past week, the wounds themselves have not improved tremendously but she has noticed some stinging and the periwound skin is quite inflamed. I took a culture last week that had low levels of methicillin-resistant Staph aureus. Because it was fairly low level, I did not prescribe an oral antibiotic. I had planned to change her to mupirocin today, however. We have been using Santyl under Hydrofera Blue with 3 layer compression. 03/04/2022: The wounds have improved quite a bit over the past week. They are less painful and red. She has been taking the prescribed doxycycline but says that it gives her a stomachache. We have been using mupirocin with Hydrofera Blue and 3 layer compression. 03/11/2022: Continued improvement of the wounds. They have contracted quite a bit and have a good surface of healthy granulation tissue present. There is some dried eschar present around all 3 of the lesions. 03/17/2022: No significant change in the wounds from last week. The surface is clean with just a little bit of eschar and slough accumulation. No odor or drainage. No concern for infection. 03/26/2022: All of her wounds are smaller this week. There is good granulation tissue on the surface of each. No slough accumulation. There is just some dry skin around the wounds but not actually involving the wounds. No concern for infection. 04/04/2022: The 2 proximal wounds have healed. The distal wound is much smaller and just has a little bit of dry eschar around the edges. 04/11/2022: Her wound is nearly healed. It is clean without concern for infection. 04/18/2022: Her wound has healed. READMISSION 01/26/2023 About 8 weeks ago, she sustained another scratch from her  Beagle on her left anterior tibial surface.. She has been trying to treat the wound at home with Vaseline. There is thick slough buildup on the wound surface and the periwound skin looks a bit macerated. No overt sign of infection. 01/29/2023: She came in for an unscheduled visit today because her wound began bleeding. It apparently bled quite profusely and her husband used an over-the- counter topical agent to get it to stop. On inspection today, he managed to achieve good hemostasis and there is no ongoing blood loss. Electronic Signature(s) ONG, BAES ([id number]) [id number] Page 2 of 8 Signed: 01/29/2023 12:13:07 PM By: Fredirick Maudlin MD FACS Entered By: Fredirick Maudlin on 01/29/2023 12:13:07 -------------------------------------------------------------------------------- Physical Exam Details Patient Name: Date of Service: [name] [name] 01/29/2023 10:30 A M Medical Record Number: [id number] Patient Account Number: [id number] Date of Birth/Sex: Treating RN: 1946-09-21 (77 y.o. F) Primary Care Provider: Teressa Lower Other Clinician: Referring Provider: Treating Provider/Extender: Jodelle Gross in Treatment: 0 Constitutional .Tachycardic, asymptomatic. . . no acute distress. Respiratory Normal work of breathing on room air. Notes 01/29/2023: The wound is hemostatic, Electronic Signature(s) Signed: 01/29/2023 12:13:51 PM By: Fredirick Maudlin MD FACS Entered By: Fredirick Maudlin on 01/29/2023 12:13:51 -------------------------------------------------------------------------------- Physician Orders Details Patient Name: Date of Service: [name] [name] 01/29/2023 10:30 A M Medical Record Number: [id number] Patient Account Number: [id number] Date of Birth/Sex: Treating RN: 02/09/1946 (77 y.o. [name], [name] Primary Care Provider: Teressa Lower Other Clinician: Referring Provider: Treating Provider/Extender: Jodelle Gross in Treatment: 0 Verbal / Phone Orders: No Diagnosis Coding ICD-10 Coding Code Description [phone] Non-pressure chronic ulcer of other part of left lower leg with fat layer exposed I27.20 Pulmonary hypertension, unspecified I51.9 Heart disease, unspecified Follow-up Appointments ppointment in 1 week. - Dr. Celine Ahr Room 2 1000 02/03/2023 Tuesday keep appointment Return A Anesthetic (In clinic) Topical Lidocaine 4% applied to wound bed Bathing/ Shower/ Hygiene May shower with protection but do not get wound dressing(s) wet. Protect dressing(s) with water repellant cover (for example, large plastic bag) or a cast cover and may then take shower. Edema Control - Lymphedema / SCD / Other Elevate legs to the level of the heart or above for 30 minutes daily and/or when sitting for 3-4 times a day throughout the day. A void standing for long periods of time. Exercise regularly If compression wraps slide down please call wound center and speak with a nurse. Wound Treatment Wound #4 - Lower Leg Wound Laterality: Left, Anterior Cleanser: Soap and Water 1 x Per Week/30 Days Discharge Instructions: May shower and wash wound with dial antibacterial soap and water prior to dressing change. Cleanser: Vashe 5.8 (oz) 1 x Per Week/30 Days Discharge Instructions: Cleanse the wound with Vashe prior to applying a clean dressing using gauze sponges, not tissue or cotton balls. Peri-Wound Care: Triamcinolone 15 (g) 1 x Per Week/30 Days [name], [name] ([id number]) [id number] Page 3 of 8 Discharge Instructions: mix with the zinc to periwound. Peri-Wound Care: Zinc Oxide Ointment 30g tube 1 x Per Week/30 Days Discharge Instructions: Apply Zinc Oxide to periwound with each dressing change Peri-Wound Care: Sween Lotion (Moisturizing lotion) 1 x Per Week/30 Days Discharge Instructions: Apply moisturizing lotion as directed Prim Dressing: Maxorb Extra Ag+ Alginate Dressing,  4x4.75 (in/in) 1 x Per Week/30 Days ary Discharge Instructions: Apply to wound bed as instructed Secondary Dressing: ABD Pad, 8x10 1 x Per Week/30 Days Discharge Instructions: Apply over primary dressing as directed. Compression Wrap: Kerlix Roll  4.5x3.1 (in/yd) 1 x Per Week/30 Days Discharge Instructions: Apply Kerlix and Coban compression as directed. Compression Wrap: Coban Self-Adherent Wrap 4x5 (in/yd) 1 x Per Week/30 Days Discharge Instructions: Apply over Kerlix as directed. Electronic Signature(s) Signed: 01/29/2023 12:44:03 PM By: Fredirick Maudlin MD FACS Entered By: Fredirick Maudlin on 01/29/2023 12:15:26 -------------------------------------------------------------------------------- Problem List Details Patient Name: Date of Service: [name] [name] 01/29/2023 10:30 A M Medical Record Number: [id number] Patient Account Number: [id number] Date of Birth/Sex: Treating RN: Jun 24, 1946 (77 y.o. [name], [name] Primary Care Provider: Teressa Lower Other Clinician: Referring Provider: Treating Provider/Extender: Jodelle Gross in Treatment: 0 Active Problems ICD-10 Encounter Code Description Active Date MDM Diagnosis [phone] Non-pressure chronic ulcer of other part of left lower leg with fat layer exposed4/10/2022 No Yes I27.20 Pulmonary hypertension, unspecified 01/26/2023 No Yes I51.9 Heart disease, unspecified 01/26/2023 No Yes Inactive Problems Resolved Problems Electronic Signature(s) Signed: 01/29/2023 12:11:50 PM By: Fredirick Maudlin MD FACS Entered By: Fredirick Maudlin on 01/29/2023 12:11:50 -------------------------------------------------------------------------------- Progress Note Details Patient Name: Date of Service: [name] [name] 01/29/2023 10:30 A M Medical Record Number: [id number] Patient Account Number: [id number] Date of Birth/Sex: Treating RN: 1946/04/21 (77 y.o. F) Primary Care Provider: Teressa Lower Other Clinician: Referring  Provider: Treating Provider/Extender: Larayne, Roffers, Dan Maker ([id number]) [id number] Page 4 of 8 Weeks in Treatment: 0 Subjective Chief Complaint Information obtained from Patient Patient seen for complaints of Non-Healing Wound. History of Present Illness (HPI) ADMISSION 02/19/2022 This is a 77 year old woman with minimal pertinent medical history. She does have COPD and pulmonary hypertension, but is not diabetic and is not a smoker. About 6 months ago, she and her husband got a new beagle puppy. The puppy scratched her leg and the scratches ultimately deteriorated into ulcerations. Apparently she sought care with a dermatologist who recommended applying a one-to-one mixture of peroxide and water to the wounds followed by a thick layer of Vaseline. She continue this for some time but then saw her primary care provider who told her to discontinue the peroxide. She has not been on any antibiotics for the scratches. Today, there are 3 separate wounds on her anterior tibial surface on the left. They are tender and her leg has localized swelling. There is yellow slough buildup in each of the wound bases. ABI in clinic today was normal at 1.16. 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 02/25/2022: Over the past week, the wounds themselves have not improved tremendously but she has noticed some stinging and the periwound skin is quite inflamed. I took a culture last week that had low levels of methicillin-resistant Staph aureus. Because it was fairly low level, I did not prescribe an oral antibiotic. I had planned to change her to mupirocin today, however. We have been using Santyl under Hydrofera Blue with 3 layer compression. 03/04/2022: The wounds have improved quite a bit over the past week. They are less painful and red. She has been taking the prescribed doxycycline but says that it gives her a stomachache. We have been  using mupirocin with Hydrofera Blue and 3 layer compression. 03/11/2022: Continued improvement of the wounds. They have contracted quite a bit and have a good surface of healthy granulation tissue present. There is some dried eschar present around all 3 of the lesions. 03/17/2022: No significant change in the wounds from last week. The surface is clean with just a little bit of eschar and slough accumulation. No odor or drainage. No concern for  infection. 03/26/2022: All of her wounds are smaller this week. There is good granulation tissue on the surface of each. No slough accumulation. There is just some dry skin around the wounds but not actually involving the wounds. No concern for infection. 04/04/2022: The 2 proximal wounds have healed. The distal wound is much smaller and just has a little bit of dry eschar around the edges. 04/11/2022: Her wound is nearly healed. It is clean without concern for infection. 04/18/2022: Her wound has healed. READMISSION 01/26/2023 About 8 weeks ago, she sustained another scratch from her Beagle on her left anterior tibial surface.. She has been trying to treat the wound at home with Vaseline. There is thick slough buildup on the wound surface and the periwound skin looks a bit macerated. No overt sign of infection. 01/29/2023: She came in for an unscheduled visit today because her wound began bleeding. It apparently bled quite profusely and her husband used an over-the- counter topical agent to get it to stop. On inspection today, he managed to achieve good hemostasis and there is no ongoing blood loss. Patient History Information obtained from Patient, Caregiver. Family History Unknown History. Social History Former smoker, Marital Status - Married, Alcohol Use - Never, Drug Use - No History, Caffeine Use - Rarely. Medical History Eyes Patient has history of Cataracts - bil removed Denies history of Glaucoma, Optic Neuritis Ear/Nose/Mouth/Throat Denies history  of Chronic sinus problems/congestion, Middle ear problems Hematologic/Lymphatic Patient has history of Anemia Respiratory Patient has history of Chronic Obstructive Pulmonary Disease (COPD) Cardiovascular Patient has history of Coronary Artery Disease, Peripheral Venous Disease - varicose veins Endocrine Denies history of Type I Diabetes, Type II Diabetes Genitourinary Denies history of End Stage Renal Disease Immunological Patient has history of Raynaudoos Denies history of Lupus Erythematosus, Scleroderma Integumentary (Skin) Denies history of History of Burn Musculoskeletal Patient has history of Rheumatoid Arthritis, Osteoarthritis Denies history of Gout Oncologic Denies history of Received Chemotherapy, Received Radiation Psychiatric Denies history of Anorexia/bulimia, Confinement Anxiety Hospitalization/Surgery History - bil cataract removal. - left shoiulder replacement. - right rotator cuff repair. - multiple lumbar spine surgeries. EMONE, ONDER ([id number]) [id number] Page 5 of 8 removed left arm and chest. - partial excision bone left 2nd toe. Medical A Surgical History Notes nd Hematologic/Lymphatic varicose vein of leg, mixed hyperlipidemia Respiratory dyspnea, chronic bronchitis, pulmonary hypertension, pulmonary emphysema Cardiovascular aortic calcification, tachycardia, left ventricular dysfunction, bradycardia Gastrointestinal Gerd Endocrine prediabetes, hypothyroidism Genitourinary bladder disorder Integumentary (Skin) contact dermatitis Musculoskeletal acquired plantar porokeratosis, neurogenic claudication d/t lumbar spinal stenosis, supraspinatus tendinitis, restless leg syndrome, spinal stenosis of lumbar region Neurologic TIA, insomnia Objective Constitutional Tachycardic, asymptomatic. no acute distress. Vitals Time Taken: 11:15 AM, Height: 63 in, Weight: 115 lbs, BMI: 20.4, Temperature: 98.3 F, Pulse:  112 bpm, Respiratory Rate: 18 breaths/min, Blood Pressure: 128/78 mmHg. Respiratory Normal work of breathing on room air. General Notes: 01/29/2023: The wound is hemostatic, Integumentary (Hair, Skin) Wound #4 status is Open. Original cause of wound was Trauma. The date acquired was: 11/27/2022. The wound is located on the Left,Anterior Lower Leg. The wound measures 3.2cm length x 2.5cm width x 0.1cm depth; 6.283cm^2 area and 0.628cm^3 volume. There is Fat Layer (Subcutaneous Tissue) exposed. There is no tunneling or undermining noted. There is a medium amount of serosanguineous drainage noted. The wound margin is distinct with the outline attached to the wound base. There is small (1-33%) red, pink granulation within the wound bed. There is a large (67-100%) amount of necrotic tissue within  the wound bed including Adherent Slough. The periwound skin appearance exhibited: Maceration, Hemosiderin Staining, Erythema. The periwound skin appearance did not exhibit: Callus, Crepitus, Excoriation, Induration, Rash, Scarring, Dry/Scaly, Atrophie Blanche, Cyanosis, Ecchymosis, Mottled, Pallor, Rubor. The surrounding wound skin color is noted with erythema which is circumferential. Erythema is measured at 2 cm. Periwound temperature was noted as No Abnormality. Assessment Active Problems ICD-10 Non-pressure chronic ulcer of other part of left lower leg with fat layer exposed Pulmonary hypertension, unspecified Heart disease, unspecified Plan Follow-up Appointments: Return Appointment in 1 week. - Dr. Celine Ahr Room 2 1000 02/03/2023 Tuesday keep appointment Anesthetic: (In clinic) Topical Lidocaine 4% applied to wound bed Bathing/ Shower/ Hygiene: May shower with protection but do not get wound dressing(s) wet. Protect dressing(s) with water repellant cover (for example, large plastic bag) or a cast cover and may then take shower. Edema Control - Lymphedema / SCD / Other: Elevate legs to the level of the  heart or above for 30 minutes daily and/or when sitting for 3-4 times a day throughout the day. Avoid standing for long periods of time. Exercise regularly [name], [name] ([id number]) [id number] Page 6 of 8 If compression wraps slide down please call wound center and speak with a nurse. WOUND #4: - Lower Leg Wound Laterality: Left, Anterior Cleanser: Soap and Water 1 x Per Week/30 Days Discharge Instructions: May shower and wash wound with dial antibacterial soap and water prior to dressing change. Cleanser: Vashe 5.8 (oz) 1 x Per Week/30 Days Discharge Instructions: Cleanse the wound with Vashe prior to applying a clean dressing using gauze sponges, not tissue or cotton balls. Peri-Wound Care: Triamcinolone 15 (g) 1 x Per Week/30 Days Discharge Instructions: mix with the zinc to periwound. Peri-Wound Care: Zinc Oxide Ointment 30g tube 1 x Per Week/30 Days Discharge Instructions: Apply Zinc Oxide to periwound with each dressing change Peri-Wound Care: Sween Lotion (Moisturizing lotion) 1 x Per Week/30 Days Discharge Instructions: Apply moisturizing lotion as directed Prim Dressing: Maxorb Extra Ag+ Alginate Dressing, 4x4.75 (in/in) 1 x Per Week/30 Days ary Discharge Instructions: Apply to wound bed as instructed Secondary Dressing: ABD Pad, 8x10 1 x Per Week/30 Days Discharge Instructions: Apply over primary dressing as directed. Com pression Wrap: Kerlix Roll 4.5x3.1 (in/yd) 1 x Per Week/30 Days Discharge Instructions: Apply Kerlix and Coban compression as directed. Com pression Wrap: Coban Self-Adherent Wrap 4x5 (in/yd) 1 x Per Week/30 Days Discharge Instructions: Apply over Kerlix as directed. 01/29/2023: She came in today because her wound had been bleeding. Her husband was able to get it to stop and I elected to leave the topical hemostatic agent in place. We will apply silver alginate and a Kerlix and Coban wrap for now. She will follow-up with me at her  regularly scheduled appointment on Tuesday. Electronic Signature(s) Signed: 01/29/2023 12:16:11 PM By: Fredirick Maudlin MD FACS Entered By: Fredirick Maudlin on 01/29/2023 12:16:11 -------------------------------------------------------------------------------- HxROS Details Patient Name: Date of Service: Lacerda, [name] 01/29/2023 10:30 A M Medical Record Number: [id number] Patient Account Number: [id number] Date of Birth/Sex: Treating RN: 1946/04/10 (77 y.o. F) Primary Care Provider: Teressa Lower Other Clinician: Referring Provider: Treating Provider/Extender: Jodelle Gross in Treatment: 0 Information Obtained From Patient Caregiver Eyes Medical History: Positive for: Cataracts - bil removed Negative for: Glaucoma; Optic Neuritis Ear/Nose/Mouth/Throat Medical History: Negative for: Chronic sinus problems/congestion; Middle ear problems Hematologic/Lymphatic Medical History: Positive for: Anemia Past Medical History Notes: varicose vein of leg, mixed hyperlipidemia Respiratory Medical History: Positive for: Chronic Obstructive Pulmonary  Disease (COPD) Past Medical History Notes: dyspnea, chronic bronchitis, pulmonary hypertension, pulmonary emphysema Cardiovascular Medical History: Positive for: Coronary Artery Disease; Peripheral Venous Disease - varicose veins Past Medical History Notes: aortic calcification, tachycardia, left ventricular dysfunction, bradycardia Gastrointestinal Medical HistoryHADLEA, [name] ([id number]) [id number] Page 7 of 8 Past Medical History Notes: [name] Endocrine Medical History: Negative for: Type I Diabetes; Type II Diabetes Past Medical History Notes: prediabetes, hypothyroidism Genitourinary Medical History: Negative for: End Stage Renal Disease Past Medical History Notes: bladder disorder Immunological Medical History: Positive for: Raynauds Negative for: Lupus Erythematosus;  Scleroderma Integumentary (Skin) Medical History: Negative for: History of Burn Past Medical History Notes: contact dermatitis Musculoskeletal Medical History: Positive for: Rheumatoid Arthritis; Osteoarthritis Negative for: Gout Past Medical History Notes: acquired plantar porokeratosis, neurogenic claudication d/t lumbar spinal stenosis, supraspinatus tendinitis, restless leg syndrome, spinal stenosis of lumbar region Neurologic Medical History: Past Medical History Notes: TIA, insomnia Oncologic Medical History: Negative for: Received Chemotherapy; Received Radiation Psychiatric Medical History: Negative for: Anorexia/bulimia; Confinement Anxiety HBO Extended History Items Eyes: Cataracts Immunizations Pneumococcal Vaccine: Received Pneumococcal Vaccination: Yes Received Pneumococcal Vaccination On or After 60th Birthday: Yes Implantable Devices None Hospitalization / Surgery History Type of Hospitalization/Surgery bil cataract removal left shoiulder replacement right rotator cuff repair multiple lumbar spine surgeries melanoma removed left arm and chest partial excision bone left 2nd toe Family and Social History [name], [name] ([id number]) [id number] Page 8 of 8 Unknown History: Yes; Former smoker; Marital Status - Married; Alcohol Use: Never; Drug Use: No History; Caffeine Use: Rarely; Financial Concerns: No; Food, Clothing or Shelter Needs: No; Support System Lacking: No; Transportation Concerns: No Electronic Signature(s) Signed: 01/29/2023 12:44:03 PM By: Fredirick Maudlin MD FACS Entered By: Fredirick Maudlin on 01/29/2023 12:13:18 -------------------------------------------------------------------------------- SuperBill Details Patient Name: Date of Service: [name] [name] 01/29/2023 Medical Record Number: [id number] Patient Account Number: [id number] Date of Birth/Sex: Treating RN: 05/26/1946 (77 y.o. [name] Primary Care  Provider: Teressa Lower Other Clinician: Referring Provider: Treating Provider/Extender: Jodelle Gross in Treatment: 0 Diagnosis Coding ICD-10 Codes Code Description [phone] Non-pressure chronic ulcer of other part of left lower leg with fat layer exposed I27.20 Pulmonary hypertension, unspecified I51.9 Heart disease, unspecified Facility Procedures : CPT4 Code: [id number] Description: [id number] - WOUND CARE VISIT-LEV 3 EST PT Modifier: Quantity: 1 Physician Procedures : CPT4 Code Description Modifier [id number] - WC PHYS LEVEL 3 - EST PT ICD-10 Diagnosis Description L97.822 Non-pressure chronic ulcer of other part of left lower leg with fat layer exposed I27.20 Pulmonary hypertension, unspecified I51.9 Heart  disease, unspecified Quantity: 1 Electronic Signature(s) Signed: 01/29/2023 12:16:22 PM By: Fredirick Maudlin MD FACS Entered By: Fredirick Maudlin on 01/29/2023 12:16:22

## 2023-01-29 NOTE — Progress Notes (Signed)
[name], [name] ([id number]) [id number] Page 1 of 9 Visit Report for 01/29/2023 Arrival Information Details Patient Name: Date of Service: [name], [name] RMA Long. 01/29/2023 10:30 A M Medical Record Number: [id number] Patient Account Number: [id number] Date of Birth/Sex: Treating RN: 25-Mar-1946 (77 y.o. F) Primary Care Aqib Lough: Teressa Lower Other Clinician: Referring Betsi Crespi: Treating Devesh Monforte/Extender: Jodelle Gross in Treatment: 0 Visit Information History Since Last Visit Added or deleted any medications: No Patient Arrived: Cane Any new allergies or adverse reactions: No Arrival Time: 11:13 Had a fall or experienced change in No Accompanied By: husband activities of daily living that may affect Transfer Assistance: Manual risk of falls: Patient Identification Verified: Yes Signs or symptoms of abuse/neglect since last visito No Secondary Verification Process Completed: Yes Hospitalized since last visit: No Patient Requires Transmission-Based Precautions: No Implantable device outside of the clinic excluding No Patient Has Alerts: Yes cellular tissue based products placed in the center Patient Alerts: ABI L 1.16 02/19/22 since last visit: Has Dressing in Place as Prescribed: No Pain Present Now: Yes Electronic Signature(s) Signed: 01/29/2023 2:53:36 PM By: Erenest Blank Entered By: Erenest Blank on 01/29/2023 11:14:02 -------------------------------------------------------------------------------- Clinic Level of Care Assessment Details Patient Name: Date of Service: [name], [name] RMA Long. 01/29/2023 10:30 A M Medical Record Number: [id number] Patient Account Number: [id number] Date of Birth/Sex: Treating RN: 1946/05/26 (77 y.o. Helene Shoe, Meta.Reding Primary Care Kasey Hansell: Teressa Lower Other Clinician: Referring Paymon Rosensteel: Treating Damarion Mendizabal/Extender: Jodelle Gross in Treatment: 0 Clinic Level of Care Assessment Items TOOL 4  Quantity Score X- 1 0 Use when only an EandM is performed on FOLLOW-UP visit ASSESSMENTS - Nursing Assessment / Reassessment X- 1 10 Reassessment of Co-morbidities (includes updates in patient status) X- 1 5 Reassessment of Adherence to Treatment Plan ASSESSMENTS - Wound and Skin A ssessment / Reassessment X - Simple Wound Assessment / Reassessment - one wound 1 5 []  - 0 Complex Wound Assessment / Reassessment - multiple wounds X- 1 10 Dermatologic / Skin Assessment (not related to wound area) ASSESSMENTS - Focused Assessment X- 1 5 Circumferential Edema Measurements - multi extremities []  - 0 Nutritional Assessment / Counseling / Intervention []  - 0 Lower Extremity Assessment (monofilament, tuning fork, pulses) []  - 0 Peripheral Arterial Disease Assessment (using hand held doppler) ASSESSMENTS - Ostomy and/or Continence Assessment and Care []  - 0 Incontinence Assessment and Management []  - 0 Ostomy Care Assessment and Management (repouching, etc.) PROCESS - Coordination of Care X - Simple Patient / Family Education for ongoing care 1 [address] Long ([id number]) [id number] Page 2 of 9 []  - 0 Complex (extensive) Patient / Family Education for ongoing care X- 1 10 Staff obtains Programmer, systems, Records, T Results / Process Orders est []  - 0 Staff telephones HHA, Nursing Homes / Clarify orders / etc []  - 0 Routine Transfer to another Facility (non-emergent condition) []  - 0 Routine Hospital Admission (non-emergent condition) []  - 0 New Admissions / Biomedical engineer / Ordering NPWT Apligraf, etc. , []  - 0 Emergency Hospital Admission (emergent condition) X- 1 10 Simple Discharge Coordination []  - 0 Complex (extensive) Discharge Coordination PROCESS - Special Needs []  - 0 Pediatric / Minor Patient Management []  - 0 Isolation Patient Management []  - 0 Hearing / Language / Visual special needs []  - 0 Assessment of Community assistance  (transportation, D/C planning, etc.) []  - 0 Additional assistance / Altered mentation []  - 0 Support Surface(s) Assessment (bed, cushion, seat, etc.) INTERVENTIONS - Wound Cleansing / Measurement X -  Simple Wound Cleansing - one wound 1 5 []  - 0 Complex Wound Cleansing - multiple wounds X- 1 5 Wound Imaging (photographs - any number of wounds) []  - 0 Wound Tracing (instead of photographs) X- 1 5 Simple Wound Measurement - one wound []  - 0 Complex Wound Measurement - multiple wounds INTERVENTIONS - Wound Dressings []  - 0 Small Wound Dressing one or multiple wounds []  - 0 Medium Wound Dressing one or multiple wounds X- 1 20 Large Wound Dressing one or multiple wounds []  - 0 Application of Medications - topical []  - 0 Application of Medications - injection INTERVENTIONS - Miscellaneous []  - 0 External ear exam []  - 0 Specimen Collection (cultures, biopsies, blood, body fluids, etc.) []  - 0 Specimen(s) / Culture(s) sent or taken to Lab for analysis []  - 0 Patient Transfer (multiple staff / Civil Service fast streamer / Similar devices) []  - 0 Simple Staple / Suture removal (25 or less) []  - 0 Complex Staple / Suture removal (26 or more) []  - 0 Hypo / Hyperglycemic Management (close monitor of Blood Glucose) []  - 0 Ankle / Brachial Index (ABI) - do not check if billed separately X- 1 5 Vital Signs Has the patient been seen at the hospital within the last three years: Yes Total Score: 110 Level Of Care: New/Established - Level 3 Electronic Signature(s) Signed: 01/29/2023 4:19:15 PM By: Deon Pilling RN, BSN Entered By: Deon Pilling on 01/29/2023 11:36:03 [name], [name] ([id number]) [id number] Page 3 of 9 -------------------------------------------------------------------------------- Encounter Discharge Information Details Patient Name: Date of Service: [name], [name] RMA Long. 01/29/2023 10:30 A M Medical Record Number: [id number] Patient Account Number:  [id number] Date of Birth/Sex: Treating RN: 1946-03-27 (77 y.o. [name] Primary Care Ashonti Leandro: Teressa Lower Other Clinician: Referring Camri Molloy: Treating Alejandro Adcox/Extender: Jodelle Gross in Treatment: 0 Encounter Discharge Information Items Discharge Condition: Stable Ambulatory Status: Ambulatory Discharge Destination: Home Transportation: Private Auto Accompanied By: husband Schedule Follow-up Appointment: Yes Clinical Summary of Care: Electronic Signature(s) Signed: 01/29/2023 4:19:15 PM By: Deon Pilling RN, BSN Entered By: Deon Pilling on 01/29/2023 11:36:55 -------------------------------------------------------------------------------- Lower Extremity Assessment Details Patient Name: Date of Service: Laham, NO RMA Long. 01/29/2023 10:30 A M Medical Record Number: [id number] Patient Account Number: [id number] Date of Birth/Sex: Treating RN: 1946-05-03 (77 y.o. F) Primary Care Navdeep Halt: Teressa Lower Other Clinician: Referring Lyndol Vanderheiden: Treating Taiwan Millon/Extender: Jodelle Gross in Treatment: 0 Edema Assessment Assessed: [Left: No] [Right: No] Edema: [Left: N] [Right: o] Calf Left: Right: Point of Measurement: 25 cm From Medial Instep 31 cm Ankle Left: Right: Point of Measurement: 10 cm From Medial Instep 19 cm Electronic Signature(s) Signed: 01/29/2023 2:53:36 PM By: Erenest Blank Entered By: Erenest Blank on 01/29/2023 11:18:13 -------------------------------------------------------------------------------- Multi Wound Chart Details Patient Name: Date of Service: [name] RMA Long. 01/29/2023 10:30 A M Medical Record Number: [id number] Patient Account Number: [id number] Date of Birth/Sex: Treating RN: [birth year] (77 y.o. F) Primary Care Kinleigh Nault: Teressa Lower Other Clinician: Referring Jermane Brayboy: Treating Kymorah Korf/Extender: Jodelle Gross in Treatment: 0 Vital Signs Height(in): 63 Pulse(bpm):  112 Weight(lbs): 115 Blood Pressure(mmHg): 128/78 Body Mass Index(BMI): 20.4 Temperature(F): 98.3 Farino, Dena Long ([id number]) [id number] Page 4 of 9 Respiratory Rate(breaths/min): 18 [4:Photos:] [N/A:N/A] Left, Anterior Lower Leg N/A N/A Wound Location: Trauma N/A N/A Wounding Event: Venous Leg Ulcer N/A N/A Primary Etiology: Cataracts, Anemia, Chronic N/A N/A Comorbid History: Obstructive Pulmonary Disease (COPD), Coronary Artery Disease, Peripheral Venous Disease, Raynauds, Rheumatoid Arthritis, Osteoarthritis 11/27/2022 N/A N/A Date Acquired: 0 N/A N/A  Weeks of Treatment: Open N/A N/A Wound Status: No N/A N/A Wound Recurrence: 3.2x2.5x0.1 N/A N/A Measurements L x W x D (cm) 6.283 N/A N/A A (cm) : rea 0.628 N/A N/A Volume (cm) : 27.90% N/A N/A % Reduction in Area: 28.00% N/A N/A % Reduction in Volume: Full Thickness Without Exposed N/A N/A Classification: Support Structures Medium N/A N/A Exudate Amount: Serosanguineous N/A N/A Exudate Type: red, brown N/A N/A Exudate Color: Distinct, outline attached N/A N/A Wound Margin: Small (1-33%) N/A N/A Granulation Amount: Red, Pink N/A N/A Granulation Quality: Large (67-100%) N/A N/A Necrotic Amount: Fat Layer (Subcutaneous Tissue): Yes N/A N/A Exposed Structures: Fascia: No Tendon: No Muscle: No Joint: No Bone: No Small (1-33%) N/A N/A Epithelialization: Excoriation: No N/A N/A Periwound Skin Texture: Induration: No Callus: No Crepitus: No Rash: No Scarring: No Maceration: Yes N/A N/A Periwound Skin Moisture: Dry/Scaly: No Erythema: Yes N/A N/A Periwound Skin Color: Hemosiderin Staining: Yes Atrophie Blanche: No Cyanosis: No Ecchymosis: No Mottled: No Pallor: No Rubor: No Circumferential N/A N/A Erythema Location: Measured: 2cm N/A N/A Erythema Measurement: No Abnormality N/A N/A Temperature: Treatment Notes Wound #4 (Lower Leg) Wound Laterality: Left,  Anterior Cleanser Soap and Water Discharge Instruction: May shower and wash wound with dial antibacterial soap and water prior to dressing change. Vashe 5.8 (oz) Discharge Instruction: Cleanse the wound with Vashe prior to applying a clean dressing using gauze sponges, not tissue or cotton balls. Peri-Wound Care Triamcinolone 15 (g) Discharge Instruction: mix with the zinc to periwound. Zinc Oxide Ointment 30g tube [name], [name] ([id number]) [id number] Page 5 of 9 Discharge Instruction: Apply Zinc Oxide to periwound with each dressing change Sween Lotion (Moisturizing lotion) Discharge Instruction: Apply moisturizing lotion as directed Topical Primary Dressing Maxorb Extra Ag+ Alginate Dressing, 4x4.75 (in/in) Discharge Instruction: Apply to wound bed as instructed Secondary Dressing ABD Pad, 8x10 Discharge Instruction: Apply over primary dressing as directed. Secured With Compression Wrap Kerlix Roll 4.5x3.1 (in/yd) Discharge Instruction: Apply Kerlix and Coban compression as directed. Coban Self-Adherent Wrap 4x5 (in/yd) Discharge Instruction: Apply over Kerlix as directed. Compression Stockings Add-Ons Electronic Signature(s) Signed: 01/29/2023 12:11:56 PM By: Fredirick Maudlin MD FACS Entered By: Fredirick Maudlin on 01/29/2023 12:11:56 -------------------------------------------------------------------------------- Multi-Disciplinary Care Plan Details Patient Name: Date of Service: [name] RMA Long. 01/29/2023 10:30 A M Medical Record Number: [id number] Patient Account Number: [id number] Date of Birth/Sex: Treating RN: 05-Jun-1946 (77 y.o. Helene Shoe, Tammi Klippel Primary Care Gervis Gaba: Teressa Lower Other Clinician: Referring Esly Selvage: Treating Briarrose Shor/Extender: Jodelle Gross in Treatment: 0 Active Inactive Nutrition Nursing Diagnoses: Potential for alteratiion in Nutrition/Potential for imbalanced  nutrition Goals: Patient/caregiver agrees to and verbalizes understanding of need to obtain nutritional consultation Date Initiated: 01/26/2023 Target Resolution Date: 03/06/2023 Goal Status: Active Patient/caregiver will maintain therapeutic glucose control Date Initiated: 01/26/2023 Target Resolution Date: 03/06/2023 Goal Status: Active Interventions: Assess patient nutrition upon admission and as needed per policy Provide education on nutrition Treatment Activities: Giving encouragement to exercise : 01/26/2023 Notes: Pain, Acute or Chronic Nursing Diagnoses: Pain, acute or chronic: actual or potential Potential alteration in comfort, pain DONELL, ALEEM ([id number]) [id number] Page 6 of 9 Goals: Patient will verbalize adequate pain control and receive pain control interventions during procedures as needed Date Initiated: 01/26/2023 Target Resolution Date: 03/06/2023 Goal Status: Active Patient/caregiver will verbalize comfort level met Date Initiated: 01/26/2023 Target Resolution Date: 03/06/2023 Goal Status: Active Interventions: Complete pain assessment as per visit requirements Encourage patient to take pain medications as prescribed Provide education on pain management Treatment Activities:  Administer pain control measures as ordered : 01/26/2023 Notes: Wound/Skin Impairment Nursing Diagnoses: Knowledge deficit related to ulceration/compromised skin integrity Goals: Patient/caregiver will verbalize understanding of skin care regimen Date Initiated: 01/26/2023 Target Resolution Date: 03/06/2023 Goal Status: Active Interventions: Assess patient/caregiver ability to perform ulcer/skin care regimen upon admission and as needed Assess ulceration(s) every visit Provide education on ulcer and skin care Treatment Activities: Skin care regimen initiated : 01/26/2023 Topical wound management initiated : 01/26/2023 Notes: Electronic Signature(s) Signed: 01/29/2023  4:19:15 PM By: Deon Pilling RN, BSN Entered By: Deon Pilling on 01/29/2023 11:19:42 -------------------------------------------------------------------------------- Pain Assessment Details Patient Name: Date of Service: [name] RMA Long. 01/29/2023 10:30 A M Medical Record Number: [id number] Patient Account Number: [id number] Date of Birth/Sex: Treating RN: 07/08/1946 (77 y.o. F) Primary Care Inaya Gillham: Teressa Lower Other Clinician: Referring Sha Burling: Treating Cydnie Deason/Extender: Jodelle Gross in Treatment: 0 Active Problems Location of Pain Severity and Description of Pain Patient Has Paino Yes Site Locations Pain LocationDENAI, [name] ([id number]) [id number] Page 7 of 9 Pain Location: Pain in Ulcers Rate the pain. Current Pain Level: 4 Pain Management and Medication Current Pain Management: Notes wraps to tight. Electronic Signature(s) Signed: 01/29/2023 2:53:36 PM By: Erenest Blank Entered By: Erenest Blank on 01/29/2023 11:14:24 -------------------------------------------------------------------------------- Patient/Caregiver Education Details Patient Name: Date of Service: [name] RMA Long. 4/4/2024andnbsp10:30 A M Medical Record Number: [id number] Patient Account Number: [id number] Date of Birth/Gender: Treating RN: [birth year] (77 y.o. [name] Primary Care Physician: Teressa Lower Other Clinician: Referring Physician: Treating Physician/Extender: Jodelle Gross in Treatment: 0 Education Assessment Education Provided To: Patient and Caregiver husband Education Topics Provided Wound/Skin Impairment: Handouts: Caring for Your Ulcer Methods: Explain/Verbal Responses: Reinforcements needed Electronic Signature(s) Signed: 01/29/2023 4:19:15 PM By: Deon Pilling RN, BSN Entered By: Deon Pilling on 01/29/2023  11:20:15 -------------------------------------------------------------------------------- Wound Assessment Details Patient Name: Date of Service: [name], NO RMA Long. 01/29/2023 10:30 A M Medical Record Number: [id number] Patient Account Number: [id number] Date of Birth/Sex: Treating RN: April 16, 1946 (77 y.o. F) Primary Care Kaiyah Eber: Teressa Lower Other Clinician: Referring Robertlee Rogacki: Treating [name] Needles/Extender: Jodelle Gross in Treatment: 0 Wound Status Wound Number: 4 Primary Venous Leg Ulcer Etiology: Wound Location: Left, Anterior Lower Leg [name], [name] ([id number]) [id number] Page 8 of 9 Wound Open Wounding Event: Trauma Status: Date Acquired: 11/27/2022 Comorbid Cataracts, Anemia, Chronic Obstructive Pulmonary Disease Weeks Of Treatment: 0 History: (COPD), Coronary Artery Disease, Peripheral Venous Disease, Clustered Wound: No Raynauds, Rheumatoid Arthritis, Osteoarthritis Photos Wound Measurements Length: (cm) 3.2 Width: (cm) 2.5 Depth: (cm) 0.1 Area: (cm) 6.283 Volume: (cm) 0.628 % Reduction in Area: 27.9% % Reduction in Volume: 28% Epithelialization: Small (1-33%) Tunneling: No Undermining: No Wound Description Classification: Full Thickness Without Exposed Support Structures Wound Margin: Distinct, outline attached Exudate Amount: Medium Exudate Type: Serosanguineous Exudate Color: red, brown Foul Odor After Cleansing: No Slough/Fibrino Yes Wound Bed Granulation Amount: Small (1-33%) Exposed Structure Granulation Quality: Red, Pink Fascia Exposed: No Necrotic Amount: Large (67-100%) Fat Layer (Subcutaneous Tissue) Exposed: Yes Necrotic Quality: Adherent Slough Tendon Exposed: No Muscle Exposed: No Joint Exposed: No Bone Exposed: No Periwound Skin Texture Texture Color No Abnormalities Noted: No No Abnormalities Noted: No Callus: No Atrophie Blanche: No Crepitus: No Cyanosis: No Excoriation:  No Ecchymosis: No Induration: No Erythema: Yes Rash: No Erythema Location: Circumferential Scarring: No Erythema Measurement: Measured 2 cm Moisture Hemosiderin Staining: Yes No Abnormalities Noted: No Mottled: No Dry / Scaly: No Pallor: No Maceration: Yes Rubor: No Temperature / Pain  Temperature: No Abnormality Treatment Notes Wound #4 (Lower Leg) Wound Laterality: Left, Anterior Cleanser Soap and Water Discharge Instruction: May shower and wash wound with dial antibacterial soap and water prior to dressing change. Vashe 5.8 (oz) Discharge Instruction: Cleanse the wound with Vashe prior to applying a clean dressing using gauze sponges, not tissue or cotton balls. Peri-Wound Care Triamcinolone 15 (g) Discharge Instruction: mix with the zinc to periwound. [name], [name] ([id number]) [id number] Page 9 of 9 Zinc Oxide Ointment 30g tube Discharge Instruction: Apply Zinc Oxide to periwound with each dressing change Sween Lotion (Moisturizing lotion) Discharge Instruction: Apply moisturizing lotion as directed Topical Primary Dressing Maxorb Extra Ag+ Alginate Dressing, 4x4.75 (in/in) Discharge Instruction: Apply to wound bed as instructed Secondary Dressing ABD Pad, 8x10 Discharge Instruction: Apply over primary dressing as directed. Secured With Compression Wrap Kerlix Roll 4.5x3.1 (in/yd) Discharge Instruction: Apply Kerlix and Coban compression as directed. Coban Self-Adherent Wrap 4x5 (in/yd) Discharge Instruction: Apply over Kerlix as directed. Compression Stockings Add-Ons Electronic Signature(s) Signed: 01/29/2023 2:53:36 PM By: Erenest Blank Entered By: Erenest Blank on 01/29/2023 11:24:27 -------------------------------------------------------------------------------- Vitals Details Patient Name: Date of Service: [name] RMA Long. 01/29/2023 10:30 A M Medical Record Number: [id number] Patient Account Number: [id number] Date of  Birth/Sex: Treating RN: [birth year] (77 y.o. F) Primary Care Brick Ketcher: Teressa Lower Other Clinician: Referring Kaeden Mester: Treating Kristal Perl/Extender: Jodelle Gross in Treatment: 0 Vital Signs Time Taken: 11:15 Temperature (F): 98.3 Height (in): 63 Pulse (bpm): 112 Weight (lbs): 115 Respiratory Rate (breaths/min): 18 Body Mass Index (BMI): 20.4 Blood Pressure (mmHg): 128/78 Reference Range: 80 - 120 mg / dl Electronic Signature(s) Signed: 01/29/2023 2:53:36 PM By: Erenest Blank Entered By: Erenest Blank on 01/29/2023 11:15:52

## 2023-02-02 ENCOUNTER — Encounter (HOSPITAL_BASED_OUTPATIENT_CLINIC_OR_DEPARTMENT_OTHER): Payer: PPO | Admitting: General Surgery

## 2023-02-03 ENCOUNTER — Encounter (HOSPITAL_BASED_OUTPATIENT_CLINIC_OR_DEPARTMENT_OTHER): Payer: PPO | Admitting: General Surgery

## 2023-02-03 DIAGNOSIS — L97822 Non-pressure chronic ulcer of other part of left lower leg with fat layer exposed: Secondary | ICD-10-CM | POA: Diagnosis not present

## 2023-02-03 NOTE — Progress Notes (Signed)
LILJA, JOST ([id number]) [id number] Page 1 of 8 Visit Report for 02/03/2023 Arrival Information Details Patient Name: Date of Service: [name], [name] [name] 02/03/2023 10:00 A M Medical Record Number: [id number] Patient Account Number: [id number] Date of Birth/Sex: Treating RN: 20-Aug-1946 (77 y.o. [name] Primary Care Donta Fuster: Dina Rich Other Clinician: Referring Jashaun Penrose: Treating Abrian Hanover/Extender: Pincus Badder in Treatment: 1 Visit Information History Since Last Visit Added or deleted any medications: No Patient Arrived: [name] Any new allergies or adverse reactions: No Arrival Time: 10:08 Had a fall or experienced change in No Accompanied By: husband activities of daily living that may affect Transfer Assistance: None risk of falls: Patient Identification Verified: Yes Signs or symptoms of abuse/neglect since last visito No Secondary Verification Process Completed: Yes Hospitalized since last visit: No Patient Requires Transmission-Based Precautions: No Implantable device outside of the clinic excluding No Patient Has Alerts: Yes cellular tissue based products placed in the center Patient Alerts: ABI L 1.16 02/19/22 since last visit: Has Dressing in Place as Prescribed: Yes Has Compression in Place as Prescribed: Yes Pain Present Now: Yes Electronic Signature(s) Signed: 02/03/2023 3:43:44 PM By: Samuella Bruin Entered By: Samuella Bruin on 02/03/2023 10:14:18 -------------------------------------------------------------------------------- Encounter Discharge Information Details Patient Name: Date of Service: [name] [name] 02/03/2023 10:00 A M Medical Record Number: [id number] Patient Account Number: [id number] Date of Birth/Sex: Treating RN: Jul 09, 1946 (77 y.o. [name] Primary Care Josette Shimabukuro: Dina Rich Other Clinician: Referring Gloria Ricardo: Treating Deryl Ports/Extender: Pincus Badder in Treatment: 1 Encounter Discharge Information Items Post Procedure Vitals Discharge Condition: Stable Temperature (F): 99.1 Ambulatory Status: Cane Pulse (bpm): 112 Discharge Destination: Home Respiratory Rate (breaths/min): 20 Transportation: Private Auto Blood Pressure (mmHg): 134/64 Accompanied By: husband Schedule Follow-up Appointment: Yes Clinical Summary of Care: Patient Declined Electronic Signature(s) Signed: 02/03/2023 3:43:44 PM By: Samuella Bruin Entered By: Samuella Bruin on 02/03/2023 10:37:12 -------------------------------------------------------------------------------- Lower Extremity Assessment Details Patient Name: Date of Service: [name] [name] 02/03/2023 10:00 A M Medical Record Number: [id number] Patient Account Number: [id number] Date of Birth/Sex: Treating RN: 04/27/1946 (77 y.o. [name] Primary Care Quandre Polinski: Dina Rich Other Clinician: Referring Kamaljit Hizer: Treating Dafney Farler/Extender: Pincus Badder in Treatment: 1 Edema Assessment B[Left: TEKOA, MAROHL ([id number])] [name]: [id number] Page 2 of 8] Assessed: [Left: No] [Right: No] Edema: [Left: N] [Right: o] Calf Left: Right: Point of Measurement: 25 cm From Medial Instep 30.4 cm Ankle Left: Right: Point of Measurement: 10 cm From Medial Instep 18.3 cm Vascular Assessment Pulses: Dorsalis Pedis Palpable: [Left:Yes] Electronic Signature(s) Signed: 02/03/2023 3:43:44 PM By: Samuella Bruin Entered By: Samuella Bruin on 02/03/2023 10:18:17 -------------------------------------------------------------------------------- Multi Wound Chart Details Patient Name: Date of Service: [name] [name] 02/03/2023 10:00 A M Medical Record Number: [id number] Patient Account Number: [id number] Date of Birth/Sex: Treating RN: [birth year] (77 y.o. F) Primary Care Daysie Helf: Dina Rich Other Clinician: Referring  Smokey Melott: Treating Skylie Hiott/Extender: Pincus Badder in Treatment: 1 Vital Signs Height(in): 63 Pulse(bpm): 112 Weight(lbs): 115 Blood Pressure(mmHg): 134/64 Body Mass Index(BMI): 20.4 Temperature(F): 99.1 Respiratory Rate(breaths/min): 20 [4:Photos:] [N/A:N/A] Left, Anterior Lower Leg N/A N/A Wound Location: Trauma N/A N/A Wounding Event: Venous Leg Ulcer N/A N/A Primary Etiology: Cataracts, Anemia, Chronic N/A N/A Comorbid History: Obstructive Pulmonary Disease (COPD), Coronary Artery Disease, Peripheral Venous Disease, Raynauds, Rheumatoid Arthritis, Osteoarthritis 11/27/2022 N/A N/A Date Acquired: 1 N/A N/A Weeks of Treatment: Open N/A N/A Wound Status: No N/A N/A Wound Recurrence: 3x3.7x0.1 N/A N/A Measurements L x W  x D (cm) 8.718 N/A N/A A (cm) : rea 0.872 N/A N/A Volume (cm) : 0.00% N/A N/A % Reduction in Area: 0.00% N/A N/A % Reduction in Volume: Full Thickness Without Exposed N/A N/A Classification: Support Structures Medium N/A N/A Exudate Amount: Serosanguineous N/A N/A Exudate Type: red, brown N/A N/A Exudate Color: Distinct, outline attached N/A N/A Wound MarginPhill Mutter: Ripp, Rosabella K ([id number]) [id number] Page 3 of 8 Medium (34-66%) N/A N/A Granulation Amount: Red, Pink N/A N/A Granulation Quality: Medium (34-66%) N/A N/A Necrotic Amount: Fat Layer (Subcutaneous Tissue): Yes N/A N/A Exposed Structures: Fascia: No Tendon: No Muscle: No Joint: No Bone: No Small (1-33%) N/A N/A Epithelialization: Debridement - Selective/Open Wound N/A N/A Debridement: Pre-procedure Verification/Time Out 10:23 N/A N/A Taken: Lidocaine 4% Topical Solution N/A N/A Pain Control: Slough N/A N/A Tissue Debrided: Non-Viable Tissue N/A N/A Level: 11.1 N/A N/A Debridement A (sq cm): rea Curette N/A N/A Instrument: Minimum N/A N/A Bleeding: Pressure N/A N/A Hemostasis A chieved: Procedure was  tolerated well N/A N/A Debridement Treatment Response: 3x3.7x0.1 N/A N/A Post Debridement Measurements L x W x D (cm) 0.872 N/A N/A Post Debridement Volume: (cm) Excoriation: No N/A N/A Periwound Skin Texture: Induration: No Callus: No Crepitus: No Rash: No Scarring: No Maceration: Yes N/A N/A Periwound Skin Moisture: Dry/Scaly: No Erythema: Yes N/A N/A Periwound Skin Color: Hemosiderin Staining: Yes Atrophie Blanche: No Cyanosis: No Ecchymosis: No Mottled: No Pallor: No Rubor: No Circumferential N/A N/A Erythema Location: Measured: 2cm N/A N/A Erythema Measurement: No Abnormality N/A N/A Temperature: Debridement N/A N/A Procedures Performed: Treatment Notes Wound #4 (Lower Leg) Wound Laterality: Left, Anterior Cleanser Soap and Water Discharge Instruction: May shower and wash wound with dial antibacterial soap and water prior to dressing change. Vashe 5.8 (oz) Discharge Instruction: Cleanse the wound with Vashe prior to applying a clean dressing using gauze sponges, not tissue or cotton balls. Peri-Wound Care Triamcinolone 15 (g) Discharge Instruction: mix with the zinc to periwound. Zinc Oxide Ointment 30g tube Discharge Instruction: Apply Zinc Oxide to periwound with each dressing change Sween Lotion (Moisturizing lotion) Discharge Instruction: Apply moisturizing lotion as directed Topical Primary Dressing Maxorb Extra Ag+ Alginate Dressing, 4x4.75 (in/in) Discharge Instruction: Apply to wound bed as instructed Secondary Dressing ABD Pad, 8x10 Discharge Instruction: Apply over primary dressing as directed. Secured With Compression Wrap Kerlix Roll 4.5x3.1 (in/yd) Discharge Instruction: Apply Kerlix and Coban compression as directed. Coban Self-Adherent Wrap 4x5 (in/yd) Discharge Instruction: Apply over Kerlix as directed. Phill MutterBECK, Naliah K ([id number]) [id number] Page 4 of 8 Compression Stockings Add-Ons Electronic  Signature(s) Signed: 02/03/2023 10:46:44 AM By: Duanne Guessannon, Jennifer MD FACS Entered By: Duanne Guessannon, Jennifer on 02/03/2023 10:46:44 -------------------------------------------------------------------------------- Multi-Disciplinary Care Plan Details Patient Name: Date of Service: Elaine HurstBECK, [name] 02/03/2023 10:00 A M Medical Record Number: [id number] Patient Account Number: [id number] Date of Birth/Sex: Treating RN: 1946/03/05 (77 y.o. Fredderick PhenixF) Herrington, Taylor Primary Care Barth Trella: Dina Richough, Robert Other Clinician: Referring Glynn Freas: Treating Addley Ballinger/Extender: Pincus Badderannon, Jennifer Dough, Robert Weeks in Treatment: 1 Active Inactive Nutrition Nursing Diagnoses: Potential for alteratiion in Nutrition/Potential for imbalanced nutrition Goals: Patient/caregiver agrees to and verbalizes understanding of need to obtain nutritional consultation Date Initiated: 01/26/2023 Target Resolution Date: 03/06/2023 Goal Status: Active Patient/caregiver will maintain therapeutic glucose control Date Initiated: 01/26/2023 Target Resolution Date: 03/06/2023 Goal Status: Active Interventions: Assess patient nutrition upon admission and as needed per policy Provide education on nutrition Treatment Activities: Giving encouragement to exercise : 01/26/2023 Notes: Pain, Acute or Chronic Nursing Diagnoses: Pain, acute or chronic: actual or potential Potential alteration in  comfort, pain Goals: Patient will verbalize adequate pain control and receive pain control interventions during procedures as needed Date Initiated: 01/26/2023 Target Resolution Date: 03/06/2023 Goal Status: Active Patient/caregiver will verbalize comfort level met Date Initiated: 01/26/2023 Target Resolution Date: 03/06/2023 Goal Status: Active Interventions: Complete pain assessment as per visit requirements Encourage patient to take pain medications as prescribed Provide education on pain management Treatment Activities: Administer pain control measures  as ordered : 01/26/2023 Notes: Wound/Skin Impairment Nursing Diagnoses: Knowledge deficit related to ulceration/compromised skin integrity ADER, RIEKER K ([id number]) [phone].pdf Page 5 of 8 Goals: Patient/caregiver will verbalize understanding of skin care regimen Date Initiated: 01/26/2023 Target Resolution Date: 03/06/2023 Goal Status: Active Interventions: Assess patient/caregiver ability to perform ulcer/skin care regimen upon admission and as needed Assess ulceration(s) every visit Provide education on ulcer and skin care Treatment Activities: Skin care regimen initiated : 01/26/2023 Topical wound management initiated : 01/26/2023 Notes: Electronic Signature(s) Signed: 02/03/2023 3:43:44 PM By: Samuella Bruin Entered By: Samuella Bruin on 02/03/2023 10:24:55 -------------------------------------------------------------------------------- Pain Assessment Details Patient Name: Date of Service: [name] [name] 02/03/2023 10:00 A M Medical Record Number: [id number] Patient Account Number: [id number] Date of Birth/Sex: Treating RN: Nov 09, 1945 (77 y.o. [name] Primary Care Junice Fei: Dina Rich Other Clinician: Referring Jniya Madara: Treating Argie Applegate/Extender: Pincus Badder in Treatment: 1 Active Problems Location of Pain Severity and Description of Pain Patient Has Paino Yes Site Locations Pain Location: Pain in Ulcers Duration of the Pain. Constant / Intermittento Intermittent Rate the pain. Current Pain Level: 5 Character of Pain Describe the Pain: Burning Pain Management and Medication Current Pain Management: Medication: No Electronic Signature(s) Signed: 02/03/2023 3:43:44 PM By: Samuella Bruin Entered By: Samuella Bruin on 02/03/2023 10:14:11 -------------------------------------------------------------------------------- Patient/Caregiver Education Details Patient Name: Date of Service: [name]  [name] 4/9/2024andnbsp10:00 A M Medical Record Number: [id number] Patient Account Number: [id number] OVIE, ROMANOS ([id number]) [id number] Page 6 of 8 Date of Birth/Gender: Treating RN: 11/23/1945 (77 y.o. [name] Primary Care Physician: Dina Rich Other Clinician: Referring Physician: Treating Physician/Extender: Pincus Badder in Treatment: 1 Education Assessment Education Provided To: Patient Education Topics Provided Wound/Skin Impairment: Methods: Explain/Verbal Responses: Reinforcements needed, State content correctly Electronic Signature(s) Signed: 02/03/2023 3:43:44 PM By: Samuella Bruin Entered By: Samuella Bruin on 02/03/2023 10:25:07 -------------------------------------------------------------------------------- Wound Assessment Details Patient Name: Date of Service: [name] [name] 02/03/2023 10:00 A M Medical Record Number: [id number] Patient Account Number: [id number] Date of Birth/Sex: Treating RN: 09/10/1946 (77 y.o. [name] Primary Care Pratik Dalziel: Dina Rich Other Clinician: Referring Ellery Meroney: Treating Ahmet Schank/Extender: Pincus Badder in Treatment: 1 Wound Status Wound Number: 4 Primary Venous Leg Ulcer Etiology: Wound Location: Left, Anterior Lower Leg Wound Open Wounding Event: Trauma Status: Date Acquired: 11/27/2022 Comorbid Cataracts, Anemia, Chronic Obstructive Pulmonary Disease Weeks Of Treatment: 1 History: (COPD), Coronary Artery Disease, Peripheral Venous Disease, Clustered Wound: No Raynauds, Rheumatoid Arthritis, Osteoarthritis Photos Wound Measurements Length: (cm) 3 Width: (cm) 3.7 Depth: (cm) 0.1 Area: (cm) 8.718 Volume: (cm) 0.872 % Reduction in Area: 0% % Reduction in Volume: 0% Epithelialization: Small (1-33%) Tunneling: No Undermining: No Wound Description Classification: Full Thickness Without Exposed Suppo Wound  Margin: Distinct, outline attached Exudate Amount: Medium Exudate Type: Serosanguineous Exudate Color: red, brown rt Structures Foul Odor After Cleansing: No Slough/Fibrino Yes Wound Bed Granulation Amount: Medium (34-66%) Exposed Structure Granulation Quality: Red, Pink Fascia Exposed: No Alexa, Kristian K ([id number]) [phone] Page 7 of 8 Necrotic Amount: Medium (34-66%) Fat Layer (Subcutaneous Tissue)  Exposed: Yes Necrotic Quality: Adherent Slough Tendon Exposed: No Muscle Exposed: No Joint Exposed: No Bone Exposed: No Periwound Skin Texture Texture Color No Abnormalities Noted: No No Abnormalities Noted: No Callus: No Atrophie Blanche: No Crepitus: No Cyanosis: No Excoriation: No Ecchymosis: No Induration: No Erythema: Yes Rash: No Erythema Location: Circumferential Scarring: No Erythema Measurement: Measured 2 cm Moisture Hemosiderin Staining: Yes No Abnormalities Noted: Yes Mottled: No Pallor: No Rubor: No Temperature / Pain Temperature: No Abnormality Treatment Notes Wound #4 (Lower Leg) Wound Laterality: Left, Anterior Cleanser Soap and Water Discharge Instruction: May shower and wash wound with dial antibacterial soap and water prior to dressing change. Vashe 5.8 (oz) Discharge Instruction: Cleanse the wound with Vashe prior to applying a clean dressing using gauze sponges, not tissue or cotton balls. Peri-Wound Care Triamcinolone 15 (g) Discharge Instruction: mix with the zinc to periwound. Zinc Oxide Ointment 30g tube Discharge Instruction: Apply Zinc Oxide to periwound with each dressing change Sween Lotion (Moisturizing lotion) Discharge Instruction: Apply moisturizing lotion as directed Topical Primary Dressing Maxorb Extra Ag+ Alginate Dressing, 4x4.75 (in/in) Discharge Instruction: Apply to wound bed as instructed Secondary Dressing ABD Pad, 8x10 Discharge Instruction: Apply over primary dressing as  directed. Secured With Compression Wrap Kerlix Roll 4.5x3.1 (in/yd) Discharge Instruction: Apply Kerlix and Coban compression as directed. Coban Self-Adherent Wrap 4x5 (in/yd) Discharge Instruction: Apply over Kerlix as directed. Compression Stockings Add-Ons Electronic Signature(s) Signed: 02/03/2023 3:43:44 PM By: Samuella Bruin Entered By: Samuella Bruin on 02/03/2023 10:20:06 -------------------------------------------------------------------------------- Vitals Details Patient Name: Date of Service: [name] [name] 02/03/2023 10:00 A Kaleen Odea, [name] ([id number]) [id number] Page 8 of 8 Medical Record Number: [id number] Patient Account Number: [id number] Date of Birth/Sex: Treating RN: Oct 01, 1946 (77 y.o. [name] Primary Care Shalamar Plourde: Dina Rich Other Clinician: Referring Jomo Forand: Treating Shamell Hittle/Extender: Pincus Badder in Treatment: 1 Vital Signs Time Taken: 10:12 Temperature (F): 99.1 Height (in): 63 Pulse (bpm): 112 Weight (lbs): 115 Respiratory Rate (breaths/min): 20 Body Mass Index (BMI): 20.4 Blood Pressure (mmHg): 134/64 Reference Range: 80 - 120 mg / dl Electronic Signature(s) Signed: 02/03/2023 3:43:44 PM By: Samuella Bruin Entered By: Samuella Bruin on 02/03/2023 10:13:59

## 2023-02-03 NOTE — Progress Notes (Signed)
TEA, COLLUMS ([id number]) [id number] Page 1 of 9 Visit Report for 02/03/2023 Chief Complaint Document Details Patient Name: Date of Service: [name], [name] RMA Long. 02/03/2023 10:00 A M Medical Record Number: [id number] Patient Account Number: [id number] Date of Birth/Sex: Treating RN: [birth year] (77 y.o. F) Primary Care Provider: Dina Rich Other Clinician: Referring Provider: Treating Provider/Extender: Pincus Badder in Treatment: 1 Information Obtained from: Patient Chief Complaint Patient seen for complaints of Non-Healing Wound. Electronic Signature(s) Signed: 02/03/2023 10:46:50 AM By: Duanne Guess MD FACS Entered By: Duanne Guess on 02/03/2023 10:46:50 -------------------------------------------------------------------------------- Debridement Details Patient Name: Date of Service: Elaine Hurst RMA Long. 02/03/2023 10:00 A M Medical Record Number: [id number] Patient Account Number: [id number] Date of Birth/Sex: Treating RN: 1946/10/21 (77 y.o. [name] Primary Care Provider: Dina Rich Other Clinician: Referring Provider: Treating Provider/Extender: Pincus Badder in Treatment: 1 Debridement Performed for Assessment: Wound #4 Left,Anterior Lower Leg Performed By: Physician Duanne Guess, MD Debridement Type: Debridement Severity of Tissue Pre Debridement: Fat layer exposed Level of Consciousness (Pre-procedure): Awake and Alert Pre-procedure Verification/Time Out Yes - 10:23 Taken: Start Time: 10:23 Pain Control: Lidocaine 4% T opical Solution T Area Debrided (L x W): otal 3 (cm) x 3.7 (cm) = 11.1 (cm) Tissue and other material debrided: Non-Viable, Slough, Slough Level: Non-Viable Tissue Debridement Description: Selective/Open Wound Instrument: Curette Bleeding: Minimum Hemostasis Achieved: Pressure Response to Treatment: Procedure was tolerated well Level of Consciousness (Post-  Awake and Alert procedure): Post Debridement Measurements of Total Wound Length: (cm) 3 Width: (cm) 3.7 Depth: (cm) 0.1 Volume: (cm) 0.872 Character of Wound/Ulcer Post Debridement: Improved Severity of Tissue Post Debridement: Fat layer exposed Post Procedure Diagnosis Same as Pre-procedure Notes scribed for Dr. Lady Gary by Samuella Bruin, RN Electronic Signature(s) Signed: 02/03/2023 12:21:18 PM By: Duanne Guess MD FACS Signed: 02/03/2023 3:43:44 PM By: Samuella Bruin Entered By: Samuella Bruin on 02/03/2023 10:27:29 [name] ([id number]) [id number] Page 2 of 9 -------------------------------------------------------------------------------- HPI Details Patient Name: Date of Service: [name], Elaine RMA Long. 02/03/2023 10:00 A M Medical Record Number: [id number] Patient Account Number: [id number] Date of Birth/Sex: Treating RN: [birth year] (77 y.o. F) Primary Care Provider: Dina Rich Other Clinician: Referring Provider: Treating Provider/Extender: Pincus Badder in Treatment: 1 History of Present Illness HPI Description: ADMISSION 02/19/2022 This is a 77 year old woman with minimal pertinent medical history. She does have COPD and pulmonary hypertension, but is not diabetic and is not a smoker. About 6 months ago, she and her husband got a new beagle puppy. The puppy scratched her leg and the scratches ultimately deteriorated into ulcerations. Apparently she sought care with a dermatologist who recommended applying a one-to-one mixture of peroxide and water to the wounds followed by a thick layer of Vaseline. She continue this for some time but then saw her primary care provider who told her to discontinue the peroxide. She has not been on any antibiotics for the scratches. Today, there are 3 separate wounds on her anterior tibial surface on the left. They are tender and her leg has localized swelling. There is yellow slough  buildup in each of the wound bases. ABI in clinic today was normal at 1.16. 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 02/25/2022: Over the past week, the wounds themselves have not improved tremendously but she has noticed some stinging and the periwound skin is quite inflamed. I took a culture last week that had low levels of methicillin-resistant Staph aureus. Because it was fairly low  level, I did not prescribe an oral antibiotic. I had planned to change her to mupirocin today, however. We have been using Santyl under Hydrofera Blue with 3 layer compression. 03/04/2022: The wounds have improved quite a bit over the past week. They are less painful and red. She has been taking the prescribed doxycycline but says that it gives her a stomachache. We have been using mupirocin with Hydrofera Blue and 3 layer compression. 03/11/2022: Continued improvement of the wounds. They have contracted quite a bit and have a good surface of healthy granulation tissue present. There is some dried eschar present around all 3 of the lesions. 03/17/2022: Elaine significant change in the wounds from last week. The surface is clean with just a little bit of eschar and slough accumulation. Elaine odor or drainage. Elaine concern for infection. 03/26/2022: All of her wounds are smaller this week. There is good granulation tissue on the surface of each. Elaine slough accumulation. There is just some dry skin around the wounds but not actually involving the wounds. Elaine concern for infection. 04/04/2022: The 2 proximal wounds have healed. The distal wound is much smaller and just has a little bit of dry eschar around the edges. 04/11/2022: Her wound is nearly healed. It is clean without concern for infection. 04/18/2022: Her wound has healed. READMISSION 01/26/2023 About 8 weeks ago, she sustained another scratch from her Beagle on her left anterior tibial surface.. She has been trying to treat the wound at home with Vaseline.  There is thick slough buildup on the wound surface and the periwound skin looks a bit macerated. Elaine overt sign of infection. 01/29/2023: She came in for an unscheduled visit today because her wound began bleeding. It apparently bled quite profusely and her husband used an over-the- counter topical agent to get it to stop. On inspection today, he managed to achieve good hemostasis and there is Elaine ongoing blood loss. 02/03/2023: Elaine further issues with bleeding. There is minimal slough accumulation. Some hypertrophic granulation tissue present. Electronic Signature(s) Signed: 02/03/2023 10:47:30 AM By: Duanne Guess MD FACS Entered By: Duanne Guess on 02/03/2023 10:47:30 -------------------------------------------------------------------------------- Physical Exam Details Patient Name: Date of Service: Elaine Hurst RMA Long. 02/03/2023 10:00 A M Medical Record Number: [id number] Patient Account Number: [id number] Date of Birth/Sex: Treating RN: 05-07-1946 (77 y.o. F) Primary Care Provider: Dina Rich Other Clinician: Referring Provider: Treating Provider/Extender: Pincus Badder in Treatment: 1 Constitutional .Tachycardic, asymptomatic. . . Elaine acute distress. Respiratory Normal work of breathing on room air. DEAIRA, LECKEY ([id number]) [id number] Page 3 of 9 Notes 02/03/2023: Elaine further issues with bleeding. There is minimal slough accumulation. Some hypertrophic granulation tissue present. Electronic Signature(s) Signed: 02/03/2023 10:48:07 AM By: Duanne Guess MD FACS Entered By: Duanne Guess on 02/03/2023 10:48:07 -------------------------------------------------------------------------------- Physician Orders Details Patient Name: Date of Service: Elaine Hurst RMA Long. 02/03/2023 10:00 A M Medical Record Number: [id number] Patient Account Number: [id number] Date of Birth/Sex: Treating RN: Apr 21, 1946 (77 y.o. [name] Primary Care  Provider: Dina Rich Other Clinician: Referring Provider: Treating Provider/Extender: Pincus Badder in Treatment: 1 Verbal / Phone Orders: Elaine Diagnosis Coding ICD-10 Coding Code Description [phone] Non-pressure chronic ulcer of other part of left lower leg with fat layer exposed I27.20 Pulmonary hypertension, unspecified I51.9 Heart disease, unspecified Follow-up Appointments ppointment in 1 week. - Dr. Lady Gary Room 2 Return A Anesthetic (In clinic) Topical Lidocaine 4% applied to wound bed Bathing/ Shower/ Hygiene May shower with protection but do not get wound  dressing(s) wet. Protect dressing(s) with water repellant cover (for example, large plastic bag) or a cast cover and may then take shower. Edema Control - Lymphedema / SCD / Other Elevate legs to the level of the heart or above for 30 minutes daily and/or when sitting for 3-4 times a day throughout the day. A void standing for long periods of time. Exercise regularly If compression wraps slide down please call wound center and speak with a nurse. Wound Treatment Wound #4 - Lower Leg Wound Laterality: Left, Anterior Cleanser: Soap and Water 1 x Per Week/30 Days Discharge Instructions: May shower and wash wound with dial antibacterial soap and water prior to dressing change. Cleanser: Vashe 5.8 (oz) 1 x Per Week/30 Days Discharge Instructions: Cleanse the wound with Vashe prior to applying a clean dressing using gauze sponges, not tissue or cotton balls. Peri-Wound Care: Triamcinolone 15 (g) 1 x Per Week/30 Days Discharge Instructions: mix with the zinc to periwound. Peri-Wound Care: Zinc Oxide Ointment 30g tube 1 x Per Week/30 Days Discharge Instructions: Apply Zinc Oxide to periwound with each dressing change Peri-Wound Care: Sween Lotion (Moisturizing lotion) 1 x Per Week/30 Days Discharge Instructions: Apply moisturizing lotion as directed Prim Dressing: Maxorb Extra Ag+ Alginate Dressing,  4x4.75 (in/in) 1 x Per Week/30 Days ary Discharge Instructions: Apply to wound bed as instructed Secondary Dressing: ABD Pad, 8x10 1 x Per Week/30 Days Discharge Instructions: Apply over primary dressing as directed. Compression Wrap: Kerlix Roll 4.5x3.1 (in/yd) 1 x Per Week/30 Days Discharge Instructions: Apply Kerlix and Coban compression as directed. Compression Wrap: Coban Self-Adherent Wrap 4x5 (in/yd) 1 x Per Week/30 Days Discharge Instructions: Apply over Kerlix as directed. Patient Medications [name], [name] ([id number]) [phone].pdf Page 4 of 9 llergies: Sulfa (Sulfonamide Antibiotics), adhesive, amoxicillin, ciprofloxacin, Statins-Hmg-Coa Reductase Inhibitors A Notifications Medication Indication Start End 02/03/2023 lidocaine DOSE topical 4 % cream - cream topical Electronic Signature(s) Signed: 02/03/2023 12:21:18 PM By: Duanne Guess MD FACS Entered By: Duanne Guess on 02/03/2023 10:48:19 -------------------------------------------------------------------------------- Problem List Details Patient Name: Date of Service: Elaine Hurst RMA Long. 02/03/2023 10:00 A M Medical Record Number: [id number] Patient Account Number: [id number] Date of Birth/Sex: Treating RN: 07-30-1946 (77 y.o. F) Primary Care Provider: Dina Rich Other Clinician: Referring Provider: Treating Provider/Extender: Pincus Badder in Treatment: 1 Active Problems ICD-10 Encounter Code Description Active Date MDM Diagnosis [phone] Non-pressure chronic ulcer of other part of left lower leg with fat layer exposed4/10/2022 Elaine Yes I27.20 Pulmonary hypertension, unspecified 01/26/2023 Elaine Yes I51.9 Heart disease, unspecified 01/26/2023 Elaine Yes Inactive Problems Resolved Problems Electronic Signature(s) Signed: 02/03/2023 10:45:39 AM By: Duanne Guess MD FACS Entered By: Duanne Guess on 02/03/2023  10:45:38 -------------------------------------------------------------------------------- Progress Note Details Patient Name: Date of Service: [name], Elaine RMA Long. 02/03/2023 10:00 A M Medical Record Number: [id number] Patient Account Number: [id number] Date of Birth/Sex: Treating RN: 08-Aug-1946 (77 y.o. F) Primary Care Provider: Dina Rich Other Clinician: Referring Provider: Treating Provider/Extender: Pincus Badder in Treatment: 1 Subjective Chief Complaint Information obtained from Patient Patient seen for complaints of Non-Healing Wound. History of Present Illness (HPI) ADMISSION 02/19/2022 This is a 77 year old woman with minimal pertinent medical history. She does have COPD and pulmonary hypertension, but is not diabetic and is not a smoker. About 6 months ago, she and her husband got a new beagle puppy. The puppy scratched her leg and the scratches ultimately deteriorated into ulcerations. JORDIS, EZZO ([id number]) [id number] Page 5 of 9 Apparently she sought care with a dermatologist who  recommended applying a one-to-one mixture of peroxide and water to the wounds followed by a thick layer of Vaseline. She continue this for some time but then saw her primary care provider who told her to discontinue the peroxide. She has not been on any antibiotics for the scratches. Today, there are 3 separate wounds on her anterior tibial surface on the left. They are tender and her leg has localized swelling. There is yellow slough buildup in each of the wound bases. ABI in clinic today was normal at 1.16. 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 02/25/2022: Over the past week, the wounds themselves have not improved tremendously but she has noticed some stinging and the periwound skin is quite inflamed. I took a culture last week that had low levels of methicillin-resistant Staph aureus. Because it was fairly low level, I  did not prescribe an oral antibiotic. I had planned to change her to mupirocin today, however. We have been using Santyl under Hydrofera Blue with 3 layer compression. 03/04/2022: The wounds have improved quite a bit over the past week. They are less painful and red. She has been taking the prescribed doxycycline but says that it gives her a stomachache. We have been using mupirocin with Hydrofera Blue and 3 layer compression. 03/11/2022: Continued improvement of the wounds. They have contracted quite a bit and have a good surface of healthy granulation tissue present. There is some dried eschar present around all 3 of the lesions. 03/17/2022: Elaine significant change in the wounds from last week. The surface is clean with just a little bit of eschar and slough accumulation. Elaine odor or drainage. Elaine concern for infection. 03/26/2022: All of her wounds are smaller this week. There is good granulation tissue on the surface of each. Elaine slough accumulation. There is just some dry skin around the wounds but not actually involving the wounds. Elaine concern for infection. 04/04/2022: The 2 proximal wounds have healed. The distal wound is much smaller and just has a little bit of dry eschar around the edges. 04/11/2022: Her wound is nearly healed. It is clean without concern for infection. 04/18/2022: Her wound has healed. READMISSION 01/26/2023 About 8 weeks ago, she sustained another scratch from her Beagle on her left anterior tibial surface.. She has been trying to treat the wound at home with Vaseline. There is thick slough buildup on the wound surface and the periwound skin looks a bit macerated. Elaine overt sign of infection. 01/29/2023: She came in for an unscheduled visit today because her wound began bleeding. It apparently bled quite profusely and her husband used an over-the- counter topical agent to get it to stop. On inspection today, he managed to achieve good hemostasis and there is Elaine ongoing blood  loss. 02/03/2023: Elaine further issues with bleeding. There is minimal slough accumulation. Some hypertrophic granulation tissue present. Patient History Information obtained from Patient, Caregiver. Family History Unknown History. Social History Former smoker, Marital Status - Married, Alcohol Use - Never, Drug Use - Elaine History, Caffeine Use - Rarely. Medical History Eyes Patient has history of Cataracts - bil removed Denies history of Glaucoma, Optic Neuritis Ear/Nose/Mouth/Throat Denies history of Chronic sinus problems/congestion, Middle ear problems Hematologic/Lymphatic Patient has history of Anemia Respiratory Patient has history of Chronic Obstructive Pulmonary Disease (COPD) Cardiovascular Patient has history of Coronary Artery Disease, Peripheral Venous Disease - varicose veins Endocrine Denies history of Type I Diabetes, Type II Diabetes Genitourinary Denies history of End Stage Renal Disease Immunological Patient has  history of Raynaudoos Denies history of Lupus Erythematosus, Scleroderma Integumentary (Skin) Denies history of History of Burn Musculoskeletal Patient has history of Rheumatoid Arthritis, Osteoarthritis Denies history of Gout Oncologic Denies history of Received Chemotherapy, Received Radiation Psychiatric Denies history of Anorexia/bulimia, Confinement Anxiety Hospitalization/Surgery History - bil cataract removal. - left shoiulder replacement. - right rotator cuff repair. - multiple lumbar spine surgeries. - melanoma removed left arm and chest. - partial excision bone left 2nd toe. Medical A Surgical History Notes nd Hematologic/Lymphatic varicose vein of leg, mixed hyperlipidemia Respiratory dyspnea, chronic bronchitis, pulmonary hypertension, pulmonary emphysema Cardiovascular aortic calcification, tachycardia, left ventricular dysfunction, bradycardia Gastrointestinal Gerd Endocrine prediabetes, hypothyroidism Genitourinary [name], [name]  ([id number]) [id number] Page 6 of 9 bladder disorder Integumentary (Skin) contact dermatitis Musculoskeletal acquired plantar porokeratosis, neurogenic claudication d/t lumbar spinal stenosis, supraspinatus tendinitis, restless leg syndrome, spinal stenosis of lumbar region Neurologic TIA, insomnia Objective Constitutional Tachycardic, asymptomatic. Elaine acute distress. Vitals Time Taken: 10:12 AM, Height: 63 in, Weight: 115 lbs, BMI: 20.4, Temperature: 99.1 F, Pulse: 112 bpm, Respiratory Rate: 20 breaths/min, Blood Pressure: 134/64 mmHg. Respiratory Normal work of breathing on room air. General Notes: 02/03/2023: Elaine further issues with bleeding. There is minimal slough accumulation. Some hypertrophic granulation tissue present. Integumentary (Hair, Skin) Wound #4 status is Open. Original cause of wound was Trauma. The date acquired was: 11/27/2022. The wound has been in treatment 1 weeks. The wound is located on the Left,Anterior Lower Leg. The wound measures 3cm length x 3.7cm width x 0.1cm depth; 8.718cm^2 area and 0.872cm^3 volume. There is Fat Layer (Subcutaneous Tissue) exposed. There is Elaine tunneling or undermining noted. There is a medium amount of serosanguineous drainage noted. The wound margin is distinct with the outline attached to the wound base. There is medium (34-66%) red, pink granulation within the wound bed. There is a medium (34-66%) amount of necrotic tissue within the wound bed including Adherent Slough. The periwound skin appearance had Elaine abnormalities noted for moisture. The periwound skin appearance exhibited: Hemosiderin Staining, Erythema. The periwound skin appearance did not exhibit: Callus, Crepitus, Excoriation, Induration, Rash, Scarring, Atrophie Blanche, Cyanosis, Ecchymosis, Mottled, Pallor, Rubor. The surrounding wound skin color is noted with erythema which is circumferential. Erythema is measured at 2 cm. Periwound temperature  was noted as Elaine Abnormality. Assessment Active Problems ICD-10 Non-pressure chronic ulcer of other part of left lower leg with fat layer exposed Pulmonary hypertension, unspecified Heart disease, unspecified Procedures Wound #4 Pre-procedure diagnosis of Wound #4 is a Venous Leg Ulcer located on the Left,Anterior Lower Leg .Severity of Tissue Pre Debridement is: Fat layer exposed. There was a Selective/Open Wound Non-Viable Tissue Debridement with a total area of 11.1 sq cm performed by Duanne Guessannon, Maude Gloor, MD. With the following instrument(s): Curette to remove Non-Viable tissue/material. Material removed includes [facility] after achieving pain control using Lidocaine 4% Topical Solution. Elaine specimens were taken. A time out was conducted at 10:23, prior to the start of the procedure. A Minimum amount of bleeding was controlled with Pressure. The procedure was tolerated well. Post Debridement Measurements: 3cm length x 3.7cm width x 0.1cm depth; 0.872cm^3 volume. Character of Wound/Ulcer Post Debridement is improved. Severity of Tissue Post Debridement is: Fat layer exposed. Post procedure Diagnosis Wound #4: Same as Pre-Procedure General Notes: scribed for Dr. Lady Garyannon by Samuella Bruinaylor Herrington, RN. Plan Follow-up Appointments: Return Appointment in 1 week. - Dr. Lady Garyannon Room 2 Anesthetic: (In clinic) Topical Lidocaine 4% applied to wound bed Bathing/ Shower/ Hygiene: May shower with protection but do not get wound  dressing(s) wet. Protect dressing(s) with water repellant cover (for example, large plastic bag) or a cast cover and may then take shower. Edema Control - Lymphedema / SCD / OtherHAZELYN, [name] ([id number]) [id number] Page 7 of 9 Elevate legs to the level of the heart or above for 30 minutes daily and/or when sitting for 3-4 times a day throughout the day. Avoid standing for long periods of time. Exercise regularly If compression wraps slide down please call  wound center and speak with a nurse. The following medication(s) was prescribed: lidocaine topical 4 % cream cream topical was prescribed at facility WOUND #4: - Lower Leg Wound Laterality: Left, Anterior Cleanser: Soap and Water 1 x Per Week/30 Days Discharge Instructions: May shower and wash wound with dial antibacterial soap and water prior to dressing change. Cleanser: Vashe 5.8 (oz) 1 x Per Week/30 Days Discharge Instructions: Cleanse the wound with Vashe prior to applying a clean dressing using gauze sponges, not tissue or cotton balls. Peri-Wound Care: Triamcinolone 15 (g) 1 x Per Week/30 Days Discharge Instructions: mix with the zinc to periwound. Peri-Wound Care: Zinc Oxide Ointment 30g tube 1 x Per Week/30 Days Discharge Instructions: Apply Zinc Oxide to periwound with each dressing change Peri-Wound Care: Sween Lotion (Moisturizing lotion) 1 x Per Week/30 Days Discharge Instructions: Apply moisturizing lotion as directed Prim Dressing: Maxorb Extra Ag+ Alginate Dressing, 4x4.75 (in/in) 1 x Per Week/30 Days ary Discharge Instructions: Apply to wound bed as instructed Secondary Dressing: ABD Pad, 8x10 1 x Per Week/30 Days Discharge Instructions: Apply over primary dressing as directed. Com pression Wrap: Kerlix Roll 4.5x3.1 (in/yd) 1 x Per Week/30 Days Discharge Instructions: Apply Kerlix and Coban compression as directed. Com pression Wrap: Coban Self-Adherent Wrap 4x5 (in/yd) 1 x Per Week/30 Days Discharge Instructions: Apply over Kerlix as directed. 02/03/2023: Elaine further issues with bleeding. There is minimal slough accumulation. Some hypertrophic granulation tissue present. I used a curette to debride the slough from the wound. We will continue silver alginate with Kerlix and Coban wrap. They will contact us if any further bleeding occurs. Follow-up in 1 week. Electronic Signature(s) Signed: 02/03/2023 10:49:04 AM By: Duanne Guess MD FACS Entered By: Duanne Guess on  02/03/2023 10:49:03 -------------------------------------------------------------------------------- HxROS Details Patient Name: Date of Service: [name], Elaine RMA Long. 02/03/2023 10:00 A M Medical Record Number: [id number] Patient Account Number: [id number] Date of Birth/Sex: Treating RN: 01/16/1946 (77 y.o. F) Primary Care Provider: Dina Rich Other Clinician: Referring Provider: Treating Provider/Extender: Pincus Badder in Treatment: 1 Information Obtained From Patient Caregiver Eyes Medical History: Positive for: Cataracts - bil removed Negative for: Glaucoma; Optic Neuritis Ear/Nose/Mouth/Throat Medical History: Negative for: Chronic sinus problems/congestion; Middle ear problems Hematologic/Lymphatic Medical History: Positive for: Anemia Past Medical History Notes: varicose vein of leg, mixed hyperlipidemia Respiratory Medical History: Positive for: Chronic Obstructive Pulmonary Disease (COPD) Past Medical History Notes: dyspnea, chronic bronchitis, pulmonary hypertension, pulmonary emphysema Cardiovascular Raffield, Tobie Long ([id number]) [id number] Page 8 of 9 Medical History: Positive for: Coronary Artery Disease; Peripheral Venous Disease - varicose veins Past Medical History Notes: aortic calcification, tachycardia, left ventricular dysfunction, bradycardia Gastrointestinal Medical History: Past Medical History Notes: Gerd Endocrine Medical History: Negative for: Type I Diabetes; Type II Diabetes Past Medical History Notes: prediabetes, hypothyroidism Genitourinary Medical History: Negative for: End Stage Renal Disease Past Medical History Notes: bladder disorder Immunological Medical History: Positive for: Raynauds Negative for: Lupus Erythematosus; Scleroderma Integumentary (Skin) Medical History: Negative for: History of Burn Past Medical History Notes: contact dermatitis Musculoskeletal Medical  History: Positive for: Rheumatoid Arthritis; Osteoarthritis Negative for: Gout Past Medical History Notes: acquired plantar porokeratosis, neurogenic claudication d/t lumbar spinal stenosis, supraspinatus tendinitis, restless leg syndrome, spinal stenosis of lumbar region Neurologic Medical History: Past Medical History Notes: TIA, insomnia Oncologic Medical History: Negative for: Received Chemotherapy; Received Radiation Psychiatric Medical History: Negative for: Anorexia/bulimia; Confinement Anxiety HBO Extended History Items Eyes: Cataracts Immunizations Pneumococcal Vaccine: Received Pneumococcal Vaccination: Yes Received Pneumococcal Vaccination On or After 60th Birthday: Yes Implantable Devices None Hospitalization / Surgery History Type of Hospitalization/Surgery bil cataract removal LILYMARIE, CUDDIHY ([id number]) [id number] Page 9 of 9 left shoiulder replacement right rotator cuff repair multiple lumbar spine surgeries melanoma removed left arm and chest partial excision bone left 2nd toe Family and Social History Unknown History: Yes; Former smoker; Marital Status - Married; Alcohol Use: Never; Drug Use: Elaine History; Caffeine Use: Rarely; Financial Concerns: Elaine; Food, Clothing or Shelter Needs: Elaine; Support System Lacking: Elaine; Transportation Concerns: Elaine Electronic Signature(s) Signed: 02/03/2023 12:21:18 PM By: Duanne Guess MD FACS Entered By: Duanne Guess on 02/03/2023 10:47:37 -------------------------------------------------------------------------------- SuperBill Details Patient Name: Date of Service: Elaine Hurst RMA Long. 02/03/2023 Medical Record Number: [id number] Patient Account Number: [id number] Date of Birth/Sex: Treating RN: Jun 20, 1946 (77 y.o. F) Primary Care Provider: Dina Rich Other Clinician: Referring Provider: Treating Provider/Extender: Pincus Badder in Treatment: 1 Diagnosis Coding ICD-10  Codes Code Description [id number] Non-pressure chronic ulcer of other part of left lower leg with fat layer exposed I27.20 Pulmonary hypertension, unspecified I51.9 Heart disease, unspecified Facility Procedures : CPT4 Code: 54982641 Description: 97597 - DEBRIDE WOUND 1ST 20 SQ CM OR < ICD-10 Diagnosis Description L97.822 Non-pressure chronic ulcer of other part of left lower leg with fat layer expose Modifier: d Quantity: 1 Physician Procedures : CPT4 Code Description Modifier [id number] 99213 - WC PHYS LEVEL 3 - EST PT 25 ICD-10 Diagnosis Description L97.822 Non-pressure chronic ulcer of other part of left lower leg with fat layer exposed I27.20 Pulmonary hypertension, unspecified I51.9 Heart  disease, unspecified Quantity: 1 : [id number] 97597 - WC PHYS DEBR WO ANESTH 20 SQ CM ICD-10 Diagnosis Description L97.822 Non-pressure chronic ulcer of other part of left lower leg with fat layer exposed Quantity: 1 Electronic Signature(s) Signed: 02/03/2023 10:49:45 AM By: Duanne Guess MD FACS Entered By: Duanne Guess on 02/03/2023 10:49:45

## 2023-02-11 ENCOUNTER — Encounter (HOSPITAL_BASED_OUTPATIENT_CLINIC_OR_DEPARTMENT_OTHER): Payer: PPO | Admitting: General Surgery

## 2023-02-11 DIAGNOSIS — L97822 Non-pressure chronic ulcer of other part of left lower leg with fat layer exposed: Secondary | ICD-10-CM | POA: Diagnosis not present

## 2023-02-11 NOTE — Progress Notes (Signed)
KINDEL, ROCHEFORT ([id number]) [id number] Page 1 of 8 Visit Report for 02/11/2023 Arrival Information Details Patient Name: Date of Service: [name], [name] RMA Long. 02/11/2023 10:00 A M Medical Record Number: [id number] Patient Account Number: [id number] Date of Birth/Sex: Treating RN: [birth year] (77 y.o. [name] Primary Care Camil Hausmann: Dina Rich Other Clinician: Referring Quinesha Selinger: Treating Arshad Oberholzer/Extender: Pincus Badder in Treatment: 2 Visit Information History Since Last Visit Added or deleted any medications: No Patient Arrived: Ambulatory Any new allergies or adverse reactions: No Arrival Time: 10:19 Had a fall or experienced change in No Accompanied By: husband activities of daily living that may affect Transfer Assistance: None risk of falls: Patient Identification Verified: Yes Signs or symptoms of abuse/neglect since last visito No Secondary Verification Process Completed: Yes Hospitalized since last visit: No Patient Requires Transmission-Based Precautions: No Implantable device outside of the clinic excluding No Patient Has Alerts: Yes cellular tissue based products placed in the center Patient Alerts: ABI L 1.16 02/19/22 since last visit: Has Dressing in Place as Prescribed: Yes Has Compression in Place as Prescribed: Yes Pain Present Now: No Electronic Signature(s) Signed: 02/11/2023 3:19:30 PM By: Samuella Bruin Entered By: Samuella Bruin on 02/11/2023 10:20:14 -------------------------------------------------------------------------------- Encounter Discharge Information Details Patient Name: Date of Service: [name] RMA Long. 02/11/2023 10:00 A M Medical Record Number: [id number] Patient Account Number: [id number] Date of Birth/Sex: Treating RN: 1945-11-09 (77 y.o. [name] Primary Care Yazlynn Birkeland: Dina Rich Other Clinician: Referring Kailen Name: Treating Naveen Lorusso/Extender: Pincus Badder in Treatment: 2 Encounter Discharge Information Items Post Procedure Vitals Discharge Condition: Stable Temperature (F): 98.2 Ambulatory Status: Ambulatory Pulse (bpm): 100 Discharge Destination: Home Respiratory Rate (breaths/min): 18 Transportation: Private Auto Blood Pressure (mmHg): 128/59 Accompanied By: husband Schedule Follow-up Appointment: Yes Clinical Summary of Care: Patient Declined Electronic Signature(s) Signed: 02/11/2023 3:19:30 PM By: Samuella Bruin Entered By: Samuella Bruin on 02/11/2023 11:24:29 -------------------------------------------------------------------------------- Lower Extremity Assessment Details Patient Name: Date of Service: [name] RMA Long. 02/11/2023 10:00 A M Medical Record Number: [id number] Patient Account Number: [id number] Date of Birth/Sex: Treating RN: 02-19-1946 (77 y.o. [name] Primary Care Zebbie Ace: Dina Rich Other Clinician: Referring Lizvette Lightsey: Treating Willamae Demby/Extender: Pincus Badder in Treatment: 2 Edema Assessment B[Left: [name] ([id number])] [name]: [id number] Page 2 of 8] Assessed: [Left: No] [Right: No] Edema: [Left: N] [Right: o] Calf Left: Right: Point of Measurement: 25 cm From Medial Instep 30.3 cm Ankle Left: Right: Point of Measurement: 10 cm From Medial Instep 19 cm Vascular Assessment Pulses: Dorsalis Pedis Palpable: [Left:Yes] Electronic Signature(s) Signed: 02/11/2023 3:19:30 PM By: Samuella Bruin Entered By: Samuella Bruin on 02/11/2023 10:22:30 -------------------------------------------------------------------------------- Multi Wound Chart Details Patient Name: Date of Service: [name] RMA Long. 02/11/2023 10:00 A M Medical Record Number: [id number] Patient Account Number: [id number] Date of Birth/Sex: Treating RN: 1946/07/04 (77 y.o. F) Primary Care Roza Creamer: Dina Rich Other  Clinician: Referring Neithan Day: Treating Zeek Rostron/Extender: Pincus Badder in Treatment: 2 Vital Signs Height(in): 63 Pulse(bpm): 100 Weight(lbs): 115 Blood Pressure(mmHg): 128/59 Body Mass Index(BMI): 20.4 Temperature(F): 98.2 Respiratory Rate(breaths/min): 18 [4:Photos:] [N/A:N/A] Left, Anterior Lower Leg N/A N/A Wound Location: Trauma N/A N/A Wounding Event: Venous Leg Ulcer N/A N/A Primary Etiology: Cataracts, Anemia, Chronic N/A N/A Comorbid History: Obstructive Pulmonary Disease (COPD), Coronary Artery Disease, Peripheral Venous Disease, Raynauds, Rheumatoid Arthritis, Osteoarthritis 11/27/2022 N/A N/A Date Acquired: 2 N/A N/A Weeks of Treatment: Open N/A N/A Wound Status: No N/A N/A Wound Recurrence: 8.5x6.3x0.1 N/A N/A Measurements L x W  x D (cm) 42.058 N/A N/A A (cm) : rea 4.206 N/A N/A Volume (cm) : -382.40% N/A N/A % Reduction in Area: -382.30% N/A N/A % Reduction in Volume: Full Thickness Without Exposed N/A N/A Classification: Support Structures Medium N/A N/A Exudate Amount: Serosanguineous N/A N/A Exudate Type: red, brown N/A N/A Exudate Color: Distinct, outline attached N/A N/A Wound MarginKEELIA, [name] ([id number]) [id number] Page 3 of 8 Medium (34-66%) N/A N/A Granulation Amount: Red, Pink N/A N/A Granulation Quality: Medium (34-66%) N/A N/A Necrotic Amount: Fat Layer (Subcutaneous Tissue): Yes N/A N/A Exposed Structures: Fascia: No Tendon: No Muscle: No Joint: No Bone: No None N/A N/A Epithelialization: Debridement - Selective/Open Wound N/A N/A Debridement: Pre-procedure Verification/Time Out 10:44 N/A N/A Taken: Lidocaine 4% Topical Solution N/A N/A Pain Control: Slough N/A N/A Tissue Debrided: Non-Viable Tissue N/A N/A Level: 12 N/A N/A Debridement A (sq cm): rea Curette N/A N/A Instrument: Minimum N/A N/A Bleeding: Pressure N/A N/A Hemostasis A  chieved: Procedure was tolerated well N/A N/A Debridement Treatment Response: 8.5x0.3x0.1 N/A N/A Post Debridement Measurements L x W x D (cm) 0.2 N/A N/A Post Debridement Volume: (cm) Excoriation: Yes N/A N/A Periwound Skin Texture: Induration: No Callus: No Crepitus: No Rash: No Scarring: No Maceration: Yes N/A N/A Periwound Skin Moisture: Dry/Scaly: No Erythema: Yes N/A N/A Periwound Skin Color: Hemosiderin Staining: Yes Atrophie Blanche: No Cyanosis: No Ecchymosis: No Mottled: No Pallor: No Rubor: No Circumferential N/A N/A Erythema Location: Measured: 2cm N/A N/A Erythema Measurement: No Abnormality N/A N/A Temperature: Debridement N/A N/A Procedures Performed: Treatment Notes Wound #4 (Lower Leg) Wound Laterality: Left, Anterior Cleanser Soap and Water Discharge Instruction: May shower and wash wound with dial antibacterial soap and water prior to dressing change. Vashe 5.8 (oz) Discharge Instruction: Cleanse the wound with Vashe prior to applying a clean dressing using gauze sponges, not tissue or cotton balls. Peri-Wound Care Triamcinolone 15 (g) Discharge Instruction: mix with the zinc to periwound. Sween Lotion (Moisturizing lotion) Discharge Instruction: Apply moisturizing lotion as directed Topical Gentamicin Discharge Instruction: As directed by physician Mupirocin Ointment Discharge Instruction: Apply Mupirocin (Bactroban) as instructed Primary Dressing Cutimed Sorbact Swab Discharge Instruction: Apply to wound bed as instructed Secondary Dressing ABD Pad, 8x10 Discharge Instruction: Apply over primary dressing as directed. Secured With Compression Wrap Kerlix Roll 4.5x3.1 (in/yd) Discharge Instruction: Apply Kerlix and Coban compression as directed. [name], [name] ([id number]) [id number] Page 4 of 8 Coban Self-Adherent Wrap 4x5 (in/yd) Discharge Instruction: Apply over Kerlix as directed. Compression  Stockings Add-Ons Electronic Signature(s) Signed: 02/11/2023 11:37:44 AM By: Duanne Guess MD FACS Entered By: Duanne Guess on 02/11/2023 11:37:44 -------------------------------------------------------------------------------- Multi-Disciplinary Care Plan Details Patient Name: Date of Service: [name] RMA Long. 02/11/2023 10:00 A M Medical Record Number: [id number] Patient Account Number: [id number] Date of Birth/Sex: Treating RN: 1946-10-03 (77 y.o. [name] Primary Care Saquan Furtick: Dina Rich Other Clinician: Referring Tulip Meharg: Treating Archit Leger/Extender: Pincus Badder in Treatment: 2 Active Inactive Nutrition Nursing Diagnoses: Potential for alteratiion in Nutrition/Potential for imbalanced nutrition Goals: Patient/caregiver agrees to and verbalizes understanding of need to obtain nutritional consultation Date Initiated: 01/26/2023 Target Resolution Date: 03/06/2023 Goal Status: Active Patient/caregiver will maintain therapeutic glucose control Date Initiated: 01/26/2023 Target Resolution Date: 03/06/2023 Goal Status: Active Interventions: Assess patient nutrition upon admission and as needed per policy Provide education on nutrition Treatment Activities: Giving encouragement to exercise : 01/26/2023 Notes: Pain, Acute or Chronic Nursing Diagnoses: Pain, acute or chronic: actual or potential Potential alteration in comfort, pain Goals: Patient will  verbalize adequate pain control and receive pain control interventions during procedures as needed Date Initiated: 01/26/2023 Target Resolution Date: 03/06/2023 Goal Status: Active Patient/caregiver will verbalize comfort level met Date Initiated: 01/26/2023 Target Resolution Date: 03/06/2023 Goal Status: Active Interventions: Complete pain assessment as per visit requirements Encourage patient to take pain medications as prescribed Provide education on pain management Treatment  Activities: Administer pain control measures as ordered : 01/26/2023 Notes: Wound/Skin Impairment [name], [name] ([id number]) [id number] Page 5 of 8 Nursing Diagnoses: Knowledge deficit related to ulceration/compromised skin integrity Goals: Patient/caregiver will verbalize understanding of skin care regimen Date Initiated: 01/26/2023 Target Resolution Date: 03/06/2023 Goal Status: Active Interventions: Assess patient/caregiver ability to perform ulcer/skin care regimen upon admission and as needed Assess ulceration(s) every visit Provide education on ulcer and skin care Treatment Activities: Skin care regimen initiated : 01/26/2023 Topical wound management initiated : 01/26/2023 Notes: Electronic Signature(s) Signed: 02/11/2023 3:19:30 PM By: Samuella Bruin Entered By: Samuella Bruin on 02/11/2023 11:23:47 -------------------------------------------------------------------------------- Pain Assessment Details Patient Name: Date of Service: [name] RMA Long. 02/11/2023 10:00 A M Medical Record Number: [id number] Patient Account Number: [id number] Date of Birth/Sex: Treating RN: Feb 03, 1946 (77 y.o. [name] Primary Care Ary Rudnick: Dina Rich Other Clinician: Referring Jeanise Durfey: Treating Mara Favero/Extender: Pincus Badder in Treatment: 2 Active Problems Location of Pain Severity and Description of Pain Patient Has Paino No Site Locations Rate the pain. Current Pain Level: 0 Pain Management and Medication Current Pain Management: Electronic Signature(s) Signed: 02/11/2023 3:19:30 PM By: Samuella Bruin Entered By: Samuella Bruin on 02/11/2023 10:20:22 -------------------------------------------------------------------------------- Patient/Caregiver Education Details Patient Name: Date of Service: [name] RMA Long. 4/17/2024andnbsp10:00 A M Medical Record Number: [id number] Patient Account Number: [id number] [name], [name] ([id number]) [id number] Page 6 of 8 Date of Birth/Gender: Treating RN: 1946/09/30 (77 y.o. [name] Primary Care Physician: Dina Rich Other Clinician: Referring Physician: Treating Physician/Extender: Pincus Badder in Treatment: 2 Education Assessment Education Provided To: Patient Education Topics Provided Wound/Skin Impairment: Methods: Explain/Verbal Responses: Reinforcements needed, State content correctly Electronic Signature(s) Signed: 02/11/2023 3:19:30 PM By: Samuella Bruin Entered By: Samuella Bruin on 02/11/2023 11:23:56 -------------------------------------------------------------------------------- Wound Assessment Details Patient Name: Date of Service: [name] RMA Long. 02/11/2023 10:00 A M Medical Record Number: [id number] Patient Account Number: [id number] Date of Birth/Sex: Treating RN: November 21, 1945 (77 y.o. [name] Primary Care Mory Herrman: Dina Rich Other Clinician: Referring Argusta Mcgann: Treating Kaicee Scarpino/Extender: Pincus Badder in Treatment: 2 Wound Status Wound Number: 4 Primary Venous Leg Ulcer Etiology: Wound Location: Left, Anterior Lower Leg Wound Open Wounding Event: Trauma Status: Date Acquired: 11/27/2022 Comorbid Cataracts, Anemia, Chronic Obstructive Pulmonary Disease Weeks Of Treatment: 2 History: (COPD), Coronary Artery Disease, Peripheral Venous Disease, Clustered Wound: No Raynauds, Rheumatoid Arthritis, Osteoarthritis Photos Wound Measurements Length: (cm) 8.5 Width: (cm) 6.3 Depth: (cm) 0.1 Area: (cm) 42.058 Volume: (cm) 4.206 % Reduction in Area: -382.4% % Reduction in Volume: -382.3% Epithelialization: None Tunneling: No Undermining: No Wound Description Classification: Full Thickness Without Exposed Suppo Wound Margin: Distinct, outline attached Exudate Amount: Medium Exudate Type: Serosanguineous Exudate Color:  red, brown rt Structures Foul Odor After Cleansing: No Slough/Fibrino Yes Wound Bed Granulation Amount: Medium (34-66%) Exposed Structure Granulation Quality: Red, Pink Fascia Exposed: No [name], [name] ([id number]) [id number] Page 7 of 8 Necrotic Amount: Medium (34-66%) Fat Layer (Subcutaneous Tissue) Exposed: Yes Necrotic Quality: Adherent Slough Tendon Exposed: No Muscle Exposed: No Joint Exposed: No Bone Exposed: No Periwound Skin Texture Texture Color No Abnormalities Noted: No No  Abnormalities Noted: No Callus: No Atrophie Blanche: No Crepitus: No Cyanosis: No Excoriation: Yes Ecchymosis: No Induration: No Erythema: Yes Rash: No Erythema Location: Circumferential Scarring: No Erythema Measurement: Measured 2 cm Moisture Hemosiderin Staining: Yes No Abnormalities Noted: Yes Mottled: No Pallor: No Rubor: No Temperature / Pain Temperature: No Abnormality Treatment Notes Wound #4 (Lower Leg) Wound Laterality: Left, Anterior Cleanser Soap and Water Discharge Instruction: May shower and wash wound with dial antibacterial soap and water prior to dressing change. Vashe 5.8 (oz) Discharge Instruction: Cleanse the wound with Vashe prior to applying a clean dressing using gauze sponges, not tissue or cotton balls. Peri-Wound Care Triamcinolone 15 (g) Discharge Instruction: mix with the zinc to periwound. Sween Lotion (Moisturizing lotion) Discharge Instruction: Apply moisturizing lotion as directed Topical Gentamicin Discharge Instruction: As directed by physician Mupirocin Ointment Discharge Instruction: Apply Mupirocin (Bactroban) as instructed Primary Dressing Cutimed Sorbact Swab Discharge Instruction: Apply to wound bed as instructed Secondary Dressing ABD Pad, 8x10 Discharge Instruction: Apply over primary dressing as directed. Secured With Compression Wrap Kerlix Roll 4.5x3.1 (in/yd) Discharge Instruction: Apply Kerlix and  Coban compression as directed. Coban Self-Adherent Wrap 4x5 (in/yd) Discharge Instruction: Apply over Kerlix as directed. Compression Stockings Add-Ons Electronic Signature(s) Signed: 02/11/2023 3:19:30 PM By: Samuella Bruin Entered By: Samuella Bruin on 02/11/2023 10:24:29 [name] ([id number]) [id number] Page 8 of 8 -------------------------------------------------------------------------------- Vitals Details Patient Name: Date of Service: [name], [name] RMA Long. 02/11/2023 10:00 A M Medical Record Number: [id number] Patient Account Number: [id number] Date of Birth/Sex: Treating RN: 02-03-1946 (77 y.o. [name] Primary Care Bradshaw Minihan: Dina Rich Other Clinician: Referring Merrill Deanda: Treating Asher Torpey/Extender: Pincus Badder in Treatment: 2 Vital Signs Time Taken: 10:20 Temperature (F): 98.2 Height (in): 63 Pulse (bpm): 100 Weight (lbs): 115 Respiratory Rate (breaths/min): 18 Body Mass Index (BMI): 20.4 Blood Pressure (mmHg): 128/59 Reference Range: 80 - 120 mg / dl Electronic Signature(s) Signed: 02/11/2023 3:19:30 PM By: Samuella Bruin Entered By: Samuella Bruin on 02/11/2023 10:21:13

## 2023-02-11 NOTE — Progress Notes (Signed)
[name], [name] ([id number]) [id number] Page 1 of 10 Visit Report for 02/11/2023 Chief Complaint Document Details Patient Name: Date of Service: Elaine, Long RMA Long. 02/11/2023 10:00 A M Medical Record Number: [id number] Patient Account Number: [id number] Date of Birth/Sex: Treating RN: 11-10-1945 (77 y.o. F) Primary Care Provider: Dina Rich Other Clinician: Referring Provider: Treating Provider/Extender: Pincus Badder in Treatment: 2 Information Obtained from: Patient Chief Complaint Patient seen for complaints of Non-Healing Wound. Electronic Signature(s) Signed: 02/11/2023 11:37:56 AM By: Duanne Guess MD FACS Entered By: Duanne Guess on 02/11/2023 11:37:55 -------------------------------------------------------------------------------- Debridement Details Patient Name: Date of Service: [name] RMA Long. 02/11/2023 10:00 A M Medical Record Number: [id number] Patient Account Number: [id number] Date of Birth/Sex: Treating RN: 06/08/1946 (77 y.o. [name] Primary Care Provider: Dina Rich Other Clinician: Referring Provider: Treating Provider/Extender: Pincus Badder in Treatment: 2 Debridement Performed for Assessment: Wound #4 Left,Anterior Lower Leg Performed By: Physician Duanne Guess, MD Debridement Type: Debridement Severity of Tissue Pre Debridement: Fat layer exposed Level of Consciousness (Pre-procedure): Awake and Alert Pre-procedure Verification/Time Out Yes - 10:44 Taken: Start Time: 10:44 Pain Control: Lidocaine 4% T opical Solution T Area Debrided (L x W): otal 3 (cm) x 4 (cm) = 12 (cm) Tissue and other material debrided: Non-Viable, Slough, Slough Level: Non-Viable Tissue Debridement Description: Selective/Open Wound Instrument: Curette Specimen: Tissue Culture Number of Specimens T aken: 1 Bleeding: Minimum Hemostasis Achieved: Pressure Response to Treatment:  Procedure was tolerated well Level of Consciousness (Post- Awake and Alert procedure): Post Debridement Measurements of Total Wound Length: (cm) 8.5 Width: (cm) 0.3 Depth: (cm) 0.1 Volume: (cm) 0.2 Character of Wound/Ulcer Post Debridement: Improved Severity of Tissue Post Debridement: Fat layer exposed Post Procedure Diagnosis Same as Pre-procedure Notes scribed for Dr. Lady Gary by Samuella Bruin, RN Electronic Signature(s) Signed: 02/11/2023 1:04:04 PM By: Duanne Guess MD FACS Mitchell, [name] ([id number]) [id number] Page 2 of 10 Signed: 02/11/2023 3:19:30 PM By: Gelene Mink By: Samuella Bruin on 02/11/2023 11:23:18 -------------------------------------------------------------------------------- HPI Details Patient Name: Date of Service: [name] RMA Long. 02/11/2023 10:00 A M Medical Record Number: [id number] Patient Account Number: [id number] Date of Birth/Sex: Treating RN: [birth year] (77 y.o. F) Primary Care Provider: Dina Rich Other Clinician: Referring Provider: Treating Provider/Extender: Pincus Badder in Treatment: 2 History of Present Illness HPI Description: ADMISSION 02/19/2022 This is a 77 year old woman with minimal pertinent medical history. She does have COPD and pulmonary hypertension, but is not diabetic and is not a smoker. About 6 months ago, she and her husband got a new beagle puppy. The puppy scratched her leg and the scratches ultimately deteriorated into ulcerations. Apparently she sought care with a dermatologist who recommended applying a one-to-one mixture of peroxide and water to the wounds followed by a thick layer of Vaseline. She continue this for some time but then saw her primary care provider who told her to discontinue the peroxide. She has not been on any antibiotics for the scratches. Today, there are 3 separate wounds on her anterior tibial surface on the left. They are  tender and her leg has localized swelling. There is yellow slough buildup in each of the wound bases. ABI in clinic today was normal at 1.16. 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 02/25/2022: Over the past week, the wounds themselves have not improved tremendously but she has noticed some stinging and the periwound skin is quite inflamed. I took a culture last week that had low levels  of methicillin-resistant Staph aureus. Because it was fairly low level, I did not prescribe an oral antibiotic. I had planned to change her to mupirocin today, however. We have been using Santyl under Hydrofera Blue with 3 layer compression. 03/04/2022: The wounds have improved quite a bit over the past week. They are less painful and red. She has been taking the prescribed doxycycline but says that it gives her a stomachache. We have been using mupirocin with Hydrofera Blue and 3 layer compression. 03/11/2022: Continued improvement of the wounds. They have contracted quite a bit and have a good surface of healthy granulation tissue present. There is some dried eschar present around all 3 of the lesions. 03/17/2022: Elaine significant change in the wounds from last week. The surface is clean with just a little bit of eschar and slough accumulation. Elaine odor or drainage. Elaine concern for infection. 03/26/2022: All of her wounds are smaller this week. There is good granulation tissue on the surface of each. Elaine slough accumulation. There is just some dry skin around the wounds but not actually involving the wounds. Elaine concern for infection. 04/04/2022: The 2 proximal wounds have healed. The distal wound is much smaller and just has a little bit of dry eschar around the edges. 04/11/2022: Her wound is nearly healed. It is clean without concern for infection. 04/18/2022: Her wound has healed. READMISSION 01/26/2023 About 8 weeks ago, she sustained another scratch from her Beagle on her left anterior tibial  surface.. She has been trying to treat the wound at home with Vaseline. There is thick slough buildup on the wound surface and the periwound skin looks a bit macerated. Elaine overt sign of infection. 01/29/2023: She came in for an unscheduled visit today because her wound began bleeding. It apparently bled quite profusely and her husband used an over-the- counter topical agent to get it to stop. On inspection today, he managed to achieve good hemostasis and there is Elaine ongoing blood loss. 02/03/2023: Elaine further issues with bleeding. There is minimal slough accumulation. Some hypertrophic granulation tissue present. 02/11/2023: She has had a lot more drainage over the past week and there has been more breakdown of the skin around her wound. The wound itself has expanded and there is quite a bit of slough on the surfaces. Electronic Signature(s) Signed: 02/11/2023 11:39:22 AM By: Duanne Guess MD FACS Entered By: Duanne Guess on 02/11/2023 11:39:22 -------------------------------------------------------------------------------- Physical Exam Details Patient Name: Date of Service: [name] RMA Long. 02/11/2023 10:00 A M Medical Record Number: [id number] Patient Account Number: [id number] Date of Birth/Sex: Treating RN: 10-13-1946 (77 y.o. F) Primary Care Provider: Dina Rich Other Clinician: Referring Provider: Treating Provider/Extender: Pincus Badder in Treatment: 2 Constitutional . Slightly tachycardic. . . Elaine acute distress. [name], [name] ([id number]) [id number] Page 3 of 10 Respiratory Normal work of breathing on room air. Notes 02/11/2023: She has had a lot more drainage over the past week and there has been more breakdown of the skin around her wound. The wound itself has expanded and there is quite a bit of slough on the surfaces. Electronic Signature(s) Signed: 02/11/2023 11:40:04 AM By: Duanne Guess MD FACS Entered By: Duanne Guess on 02/11/2023 11:40:03 -------------------------------------------------------------------------------- Physician Orders Details Patient Name: Date of Service: [name] RMA Long. 02/11/2023 10:00 A M Medical Record Number: [id number] Patient Account Number: [id number] Date of Birth/Sex: Treating RN: 03-26-1946 (77 y.o. [name] Primary Care Provider: Dina Rich Other Clinician: Referring Provider: Treating Provider/Extender: Allena Earing,  Wonda Cerise in Treatment: 2 Verbal / Phone Orders: Elaine Diagnosis Coding ICD-10 Coding Code Description [phone] Non-pressure chronic ulcer of other part of left lower leg with fat layer exposed I27.20 Pulmonary hypertension, unspecified I51.9 Heart disease, unspecified Follow-up Appointments ppointment in 1 week. - Dr. Lady Gary Room 2 Return A Anesthetic (In clinic) Topical Lidocaine 4% applied to wound bed Bathing/ Shower/ Hygiene May shower with protection but do not get wound dressing(s) wet. Protect dressing(s) with water repellant cover (for example, large plastic bag) or a cast cover and may then take shower. Edema Control - Lymphedema / SCD / Other Elevate legs to the level of the heart or above for 30 minutes daily and/or when sitting for 3-4 times a day throughout the day. A void standing for long periods of time. Exercise regularly If compression wraps slide down please call wound center and speak with a nurse. Wound Treatment Wound #4 - Lower Leg Wound Laterality: Left, Anterior Cleanser: Soap and Water 1 x Per Week/30 Days Discharge Instructions: May shower and wash wound with dial antibacterial soap and water prior to dressing change. Cleanser: Vashe 5.8 (oz) 1 x Per Week/30 Days Discharge Instructions: Cleanse the wound with Vashe prior to applying a clean dressing using gauze sponges, not tissue or cotton balls. Peri-Wound Care: Triamcinolone 15 (g) 1 x Per Week/30 Days Discharge Instructions: mix with  the zinc to periwound. Peri-Wound Care: Sween Lotion (Moisturizing lotion) 1 x Per Week/30 Days Discharge Instructions: Apply moisturizing lotion as directed Topical: Gentamicin 1 x Per Week/30 Days Discharge Instructions: As directed by physician Topical: Mupirocin Ointment 1 x Per Week/30 Days Discharge Instructions: Apply Mupirocin (Bactroban) as instructed Prim Dressing: Cutimed Sorbact Swab 1 x Per Week/30 Days ary Discharge Instructions: Apply to wound bed as instructed Secondary Dressing: ABD Pad, 8x10 1 x Per Week/30 Days Discharge Instructions: Apply over primary dressing as directed. Compression Wrap: Kerlix Roll 4.5x3.1 (in/yd) 1 x Per Week/30 Days [name], [name] ([id number]) [id number] Page 4 of 10 Discharge Instructions: Apply Kerlix and Coban compression as directed. Compression Wrap: Coban Self-Adherent Wrap 4x5 (in/yd) 1 x Per Week/30 Days Discharge Instructions: Apply over Kerlix as directed. Laboratory naerobe culture (MICRO) - PCR of nonhealing wound to left leg Bacteria identified in Unspecified specimen by A LOINC Code: 635-3 Convenience Name: Anaerobic culture Patient Medications llergies: Sulfa (Sulfonamide Antibiotics), adhesive, amoxicillin, ciprofloxacin, Statins-Hmg-Coa Reductase Inhibitors A Notifications Medication Indication Start End 02/11/2023 lidocaine DOSE topical 4 % cream - cream topical Electronic Signature(s) Signed: 02/11/2023 1:04:04 PM By: Duanne Guess MD FACS Entered By: Duanne Guess on 02/11/2023 11:40:19 -------------------------------------------------------------------------------- Problem List Details Patient Name: Date of Service: [name] RMA Long. 02/11/2023 10:00 A M Medical Record Number: [id number] Patient Account Number: [id number] Date of Birth/Sex: Treating RN: 1946/08/31 (77 y.o. F) Primary Care Provider: Dina Rich Other Clinician: Referring Provider: Treating Provider/Extender: Pincus Badder in Treatment: 2 Active Problems ICD-10 Encounter Code Description Active Date MDM Diagnosis [phone] Non-pressure chronic ulcer of other part of left lower leg with fat layer exposed4/10/2022 Elaine Yes I27.20 Pulmonary hypertension, unspecified 01/26/2023 Elaine Yes I51.9 Heart disease, unspecified 01/26/2023 Elaine Yes Inactive Problems Resolved Problems Electronic Signature(s) Signed: 02/11/2023 11:37:36 AM By: Duanne Guess MD FACS Entered By: Duanne Guess on 02/11/2023 11:37:36 -------------------------------------------------------------------------------- Progress Note Details Patient Name: Date of Service: [name] RMA Long. 02/11/2023 10:00 A M Medical Record Number: [id number] Patient Account Number: [id number] Date of Birth/Sex: Treating RN: [birth year] (77 y.o. [name], [name] ([id number]) [id number] Page 5  of 10 Primary Care Provider: Dina Rich Other Clinician: Referring Provider: Treating Provider/Extender: Pincus Badder in Treatment: 2 Subjective Chief Complaint Information obtained from Patient Patient seen for complaints of Non-Healing Wound. History of Present Illness (HPI) ADMISSION 02/19/2022 This is a 77 year old woman with minimal pertinent medical history. She does have COPD and pulmonary hypertension, but is not diabetic and is not a smoker. About 6 months ago, she and her husband got a new beagle puppy. The puppy scratched her leg and the scratches ultimately deteriorated into ulcerations. Apparently she sought care with a dermatologist who recommended applying a one-to-one mixture of peroxide and water to the wounds followed by a thick layer of Vaseline. She continue this for some time but then saw her primary care provider who told her to discontinue the peroxide. She has not been on any antibiotics for the scratches. Today, there are 3 separate wounds on her anterior tibial surface on  the left. They are tender and her leg has localized swelling. There is yellow slough buildup in each of the wound bases. ABI in clinic today was normal at 1.16. 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 02/25/2022: Over the past week, the wounds themselves have not improved tremendously but she has noticed some stinging and the periwound skin is quite inflamed. I took a culture last week that had low levels of methicillin-resistant Staph aureus. Because it was fairly low level, I did not prescribe an oral antibiotic. I had planned to change her to mupirocin today, however. We have been using Santyl under Hydrofera Blue with 3 layer compression. 03/04/2022: The wounds have improved quite a bit over the past week. They are less painful and red. She has been taking the prescribed doxycycline but says that it gives her a stomachache. We have been using mupirocin with Hydrofera Blue and 3 layer compression. 03/11/2022: Continued improvement of the wounds. They have contracted quite a bit and have a good surface of healthy granulation tissue present. There is some dried eschar present around all 3 of the lesions. 03/17/2022: Elaine significant change in the wounds from last week. The surface is clean with just a little bit of eschar and slough accumulation. Elaine odor or drainage. Elaine concern for infection. 03/26/2022: All of her wounds are smaller this week. There is good granulation tissue on the surface of each. Elaine slough accumulation. There is just some dry skin around the wounds but not actually involving the wounds. Elaine concern for infection. 04/04/2022: The 2 proximal wounds have healed. The distal wound is much smaller and just has a little bit of dry eschar around the edges. 04/11/2022: Her wound is nearly healed. It is clean without concern for infection. 04/18/2022: Her wound has healed. READMISSION 01/26/2023 About 8 weeks ago, she sustained another scratch from her Beagle on her left  anterior tibial surface.. She has been trying to treat the wound at home with Vaseline. There is thick slough buildup on the wound surface and the periwound skin looks a bit macerated. Elaine overt sign of infection. 01/29/2023: She came in for an unscheduled visit today because her wound began bleeding. It apparently bled quite profusely and her husband used an over-the- counter topical agent to get it to stop. On inspection today, he managed to achieve good hemostasis and there is Elaine ongoing blood loss. 02/03/2023: Elaine further issues with bleeding. There is minimal slough accumulation. Some hypertrophic granulation tissue present. 02/11/2023: She has had a lot more drainage over  the past week and there has been more breakdown of the skin around her wound. The wound itself has expanded and there is quite a bit of slough on the surfaces. Patient History Information obtained from Patient, Caregiver. Family History Unknown History. Social History Former smoker, Marital Status - Married, Alcohol Use - Never, Drug Use - Elaine History, Caffeine Use - Rarely. Medical History Eyes Patient has history of Cataracts - bil removed Denies history of Glaucoma, Optic Neuritis Ear/Nose/Mouth/Throat Denies history of Chronic sinus problems/congestion, Middle ear problems Hematologic/Lymphatic Patient has history of Anemia Respiratory Patient has history of Chronic Obstructive Pulmonary Disease (COPD) Cardiovascular Patient has history of Coronary Artery Disease, Peripheral Venous Disease - varicose veins Endocrine Denies history of Type I Diabetes, Type II Diabetes Genitourinary Denies history of End Stage Renal Disease Immunological Patient has history of Raynaudoos Denies history of Lupus Erythematosus, Scleroderma Integumentary (Skin) Denies history of History of Burn Musculoskeletal Patient has history of Rheumatoid Arthritis, Osteoarthritis [name], [name] Long ([id number])  [id number] Page 6 of 10 Denies history of Gout Oncologic Denies history of Received Chemotherapy, Received Radiation Psychiatric Denies history of Anorexia/bulimia, Confinement Anxiety Hospitalization/Surgery History - bil cataract removal. - left shoiulder replacement. - right rotator cuff repair. - multiple lumbar spine surgeries. - melanoma removed left arm and chest. - partial excision bone left 2nd toe. Medical A Surgical History Notes nd Hematologic/Lymphatic varicose vein of leg, mixed hyperlipidemia Respiratory dyspnea, chronic bronchitis, pulmonary hypertension, pulmonary emphysema Cardiovascular aortic calcification, tachycardia, left ventricular dysfunction, bradycardia Gastrointestinal Gerd Endocrine prediabetes, hypothyroidism Genitourinary bladder disorder Integumentary (Skin) contact dermatitis Musculoskeletal acquired plantar porokeratosis, neurogenic claudication d/t lumbar spinal stenosis, supraspinatus tendinitis, restless leg syndrome, spinal stenosis of lumbar region Neurologic TIA, insomnia Objective Constitutional Slightly tachycardic. Elaine acute distress. Vitals Time Taken: 10:20 AM, Height: 63 in, Weight: 115 lbs, BMI: 20.4, Temperature: 98.2 F, Pulse: 100 bpm, Respiratory Rate: 18 breaths/min, Blood Pressure: 128/59 mmHg. Respiratory Normal work of breathing on room air. General Notes: 02/11/2023: She has had a lot more drainage over the past week and there has been more breakdown of the skin around her wound. The wound itself has expanded and there is quite a bit of slough on the surfaces. Integumentary (Hair, Skin) Wound #4 status is Open. Original cause of wound was Trauma. The date acquired was: 11/27/2022. The wound has been in treatment 2 weeks. The wound is located on the Left,Anterior Lower Leg. The wound measures 8.5cm length x 6.3cm width x 0.1cm depth; 42.058cm^2 area and 4.206cm^3 volume. There is Fat Layer  (Subcutaneous Tissue) exposed. There is Elaine tunneling or undermining noted. There is a medium amount of serosanguineous drainage noted. The wound margin is distinct with the outline attached to the wound base. There is medium (34-66%) red, pink granulation within the wound bed. There is a medium (34-66%) amount of necrotic tissue within the wound bed including Adherent Slough. The periwound skin appearance had Elaine abnormalities noted for moisture. The periwound skin appearance exhibited: Excoriation, Hemosiderin Staining, Erythema. The periwound skin appearance did not exhibit: Callus, Crepitus, Induration, Rash, Scarring, Atrophie Blanche, Cyanosis, Ecchymosis, Mottled, Pallor, Rubor. The surrounding wound skin color is noted with erythema which is circumferential. Erythema is measured at 2 cm. Periwound temperature was noted as Elaine Abnormality. Assessment Active Problems ICD-10 Non-pressure chronic ulcer of other part of left lower leg with fat layer exposed Pulmonary hypertension, unspecified Heart disease, unspecified Procedures Wound #4 Pre-procedure diagnosis of Wound #4 is a Venous Leg Ulcer located on the Left,Anterior Lower Leg .Severity  of Tissue Pre Debridement is: Fat layer exposed. There was a Selective/Open Wound Non-Viable Tissue Debridement with a total area of 12 sq cm performed by Duanne Guess, MD. With the following [name], [name] ([id number]) [id number] Page 7 of 10 instrument(s): Curette to remove Non-Viable tissue/material. Material removed includes [facility] after achieving pain control using Lidocaine 4% T opical Solution. 1 specimen was taken by a Tissue Culture and sent to the lab per facility protocol. A time out was conducted at 10:44, prior to the start of the procedure. A Minimum amount of bleeding was controlled with Pressure. The procedure was tolerated well. Post Debridement Measurements: 8.5cm length x 0.3cm width x 0.1cm depth; 0.2cm^3  volume. Character of Wound/Ulcer Post Debridement is improved. Severity of Tissue Post Debridement is: Fat layer exposed. Post procedure Diagnosis Wound #4: Same as Pre-Procedure General Notes: scribed for Dr. Lady Gary by Samuella Bruin, RN. Plan Follow-up Appointments: Return Appointment in 1 week. - Dr. Lady Gary Room 2 Anesthetic: (In clinic) Topical Lidocaine 4% applied to wound bed Bathing/ Shower/ Hygiene: May shower with protection but do not get wound dressing(s) wet. Protect dressing(s) with water repellant cover (for example, large plastic bag) or a cast cover and may then take shower. Edema Control - Lymphedema / SCD / Other: Elevate legs to the level of the heart or above for 30 minutes daily and/or when sitting for 3-4 times a day throughout the day. Avoid standing for long periods of time. Exercise regularly If compression wraps slide down please call wound center and speak with a nurse. Laboratory ordered were: Anaerobic culture - PCR of nonhealing wound to left leg The following medication(s) was prescribed: lidocaine topical 4 % cream cream topical was prescribed at facility WOUND #4: - Lower Leg Wound Laterality: Left, Anterior Cleanser: Soap and Water 1 x Per Week/30 Days Discharge Instructions: May shower and wash wound with dial antibacterial soap and water prior to dressing change. Cleanser: Vashe 5.8 (oz) 1 x Per Week/30 Days Discharge Instructions: Cleanse the wound with Vashe prior to applying a clean dressing using gauze sponges, not tissue or cotton balls. Peri-Wound Care: Triamcinolone 15 (g) 1 x Per Week/30 Days Discharge Instructions: mix with the zinc to periwound. Peri-Wound Care: Sween Lotion (Moisturizing lotion) 1 x Per Week/30 Days Discharge Instructions: Apply moisturizing lotion as directed Topical: Gentamicin 1 x Per Week/30 Days Discharge Instructions: As directed by physician Topical: Mupirocin Ointment 1 x Per Week/30 Days Discharge  Instructions: Apply Mupirocin (Bactroban) as instructed Prim Dressing: Cutimed Sorbact Swab 1 x Per Week/30 Days ary Discharge Instructions: Apply to wound bed as instructed Secondary Dressing: ABD Pad, 8x10 1 x Per Week/30 Days Discharge Instructions: Apply over primary dressing as directed. Com pression Wrap: Kerlix Roll 4.5x3.1 (in/yd) 1 x Per Week/30 Days Discharge Instructions: Apply Kerlix and Coban compression as directed. Com pression Wrap: Coban Self-Adherent Wrap 4x5 (in/yd) 1 x Per Week/30 Days Discharge Instructions: Apply over Kerlix as directed. 02/11/2023: She has had a lot more drainage over the past week and there has been more breakdown of the skin around her wound. The wound itself has expanded and there is quite a bit of slough on the surfaces. I used a curette to debride slough from her wounds. Due to the expansion and increased drainage, I am concerned about possible infection. I did take a culture. I am not sure she needs systemic antibiotics at this point so we are just going to apply topical mupirocin and topical gentamicin to the wound surface. I am  also going to change her contact layer to Sorbact. Continue Kerlix and Coban wrap. Once her culture data return, I will make antibiotic adjustments as indicated. Follow-up in 1 week. Electronic Signature(s) Signed: 02/11/2023 11:41:47 AM By: Duanne Guess MD FACS Entered By: Duanne Guess on 02/11/2023 11:41:46 -------------------------------------------------------------------------------- HxROS Details Patient Name: Date of Service: [name], Elaine RMA Long. 02/11/2023 10:00 A M Medical Record Number: [id number] Patient Account Number: [id number] Date of Birth/Sex: Treating RN: 02-04-1946 (77 y.o. F) Primary Care Provider: Dina Rich Other Clinician: Referring Provider: Treating Provider/Extender: Pincus Badder in Treatment: 2 Information Obtained From Patient Caregiver [name], [name]  ([id number]) [id number] Page 8 of 10 Eyes Medical History: Positive for: Cataracts - bil removed Negative for: Glaucoma; Optic Neuritis Ear/Nose/Mouth/Throat Medical History: Negative for: Chronic sinus problems/congestion; Middle ear problems Hematologic/Lymphatic Medical History: Positive for: Anemia Past Medical History Notes: varicose vein of leg, mixed hyperlipidemia Respiratory Medical History: Positive for: Chronic Obstructive Pulmonary Disease (COPD) Past Medical History Notes: dyspnea, chronic bronchitis, pulmonary hypertension, pulmonary emphysema Cardiovascular Medical History: Positive for: Coronary Artery Disease; Peripheral Venous Disease - varicose veins Past Medical History Notes: aortic calcification, tachycardia, left ventricular dysfunction, bradycardia Gastrointestinal Medical History: Past Medical History Notes: Gerd Endocrine Medical History: Negative for: Type I Diabetes; Type II Diabetes Past Medical History Notes: prediabetes, hypothyroidism Genitourinary Medical History: Negative for: End Stage Renal Disease Past Medical History Notes: bladder disorder Immunological Medical History: Positive for: Raynauds Negative for: Lupus Erythematosus; Scleroderma Integumentary (Skin) Medical History: Negative for: History of Burn Past Medical History Notes: contact dermatitis Musculoskeletal Medical History: Positive for: Rheumatoid Arthritis; Osteoarthritis Negative for: Gout Past Medical History Notes: acquired plantar porokeratosis, neurogenic claudication d/t lumbar spinal stenosis, supraspinatus tendinitis, restless leg syndrome, spinal stenosis of lumbar region Neurologic Medical History: Past Medical History Notes: TIA, insomnia Oncologic EVANTHIA, MAUND ([id number]) [id number] Page 9 of 10 Medical History: Negative for: Received Chemotherapy; Received Radiation Psychiatric Medical  History: Negative for: Anorexia/bulimia; Confinement Anxiety HBO Extended History Items Eyes: Cataracts Immunizations Pneumococcal Vaccine: Received Pneumococcal Vaccination: Yes Received Pneumococcal Vaccination On or After 60th Birthday: Yes Implantable Devices None Hospitalization / Surgery History Type of Hospitalization/Surgery bil cataract removal left shoiulder replacement right rotator cuff repair multiple lumbar spine surgeries melanoma removed left arm and chest partial excision bone left 2nd toe Family and Social History Unknown History: Yes; Former smoker; Marital Status - Married; Alcohol Use: Never; Drug Use: Elaine History; Caffeine Use: Rarely; Financial Concerns: Elaine; Food, Clothing or Shelter Needs: Elaine; Support System Lacking: Elaine; Transportation Concerns: Elaine Electronic Signature(s) Signed: 02/11/2023 1:04:04 PM By: Duanne Guess MD FACS Entered By: Duanne Guess on 02/11/2023 11:39:29 -------------------------------------------------------------------------------- SuperBill Details Patient Name: Date of Service: [name] RMA Long. 02/11/2023 Medical Record Number: [id number] Patient Account Number: [id number] Date of Birth/Sex: Treating RN: September 02, 1946 (77 y.o. F) Primary Care Provider: Dina Rich Other Clinician: Referring Provider: Treating Provider/Extender: Pincus Badder in Treatment: 2 Diagnosis Coding ICD-10 Codes Code Description [phone] Non-pressure chronic ulcer of other part of left lower leg with fat layer exposed I27.20 Pulmonary hypertension, unspecified I51.9 Heart disease, unspecified Facility Procedures : CPT4 Code: [id number] Description: 97597 - DEBRIDE WOUND 1ST 20 SQ CM OR < ICD-10 Diagnosis Description L97.822 Non-pressure chronic ulcer of other part of left lower leg with fat layer expose Modifier: d Quantity: 1 Physician Procedures : CPT4 Code Description Modifier [id number] 99214 - WC PHYS LEVEL 4 - EST PT 25  ICD-10 Diagnosis Description L97.822 Non-pressure chronic ulcer of other part of  left lower leg with fat layer exposed I27.20 Pulmonary hypertension, unspecified Maurin, Hadassa Long  ([id number]) [id number] I51.9 Heart disease, unspecified Quantity: 1 7.pdf Page 10 of 10 : [id number] 97597 - WC PHYS DEBR WO ANESTH 20 SQ CM 1 ICD-10 Diagnosis Description L97.822 Non-pressure chronic ulcer of other part of left lower leg with fat layer exposed Quantity: Electronic Signature(s) Signed: 02/11/2023 11:42:05 AM By: Duanne Guess MD FACS Entered By: Duanne Guess on 02/11/2023 11:42:05

## 2023-02-18 ENCOUNTER — Encounter (HOSPITAL_BASED_OUTPATIENT_CLINIC_OR_DEPARTMENT_OTHER): Payer: PPO | Admitting: General Surgery

## 2023-02-18 DIAGNOSIS — L97822 Non-pressure chronic ulcer of other part of left lower leg with fat layer exposed: Secondary | ICD-10-CM | POA: Diagnosis not present

## 2023-02-19 NOTE — Progress Notes (Signed)
ZO, LOUDON ([id number]) [id number] Page 1 of 8 Visit Report for 02/18/2023 Arrival Information Details Patient Name: Date of Service: [name], [name] [name] 02/18/2023 10:45 A M Medical Record Number: [id number] Patient Account Number: [id number] Date of Birth/Sex: Treating RN: 12/04/1945 (77 y.o. [name] Primary Care Ross Hefferan: Dina Rich Other Clinician: Referring Ninel Abdella: Treating Kaysen Sefcik/Extender: Pincus Badder in Treatment: 3 Visit Information History Since Last Visit Added or deleted any medications: No Patient Arrived: [name] Any new allergies or adverse reactions: No Arrival Time: 10:45 Had a fall or experienced change in No Accompanied By: husband activities of daily living that may affect Transfer Assistance: None risk of falls: Patient Identification Verified: Yes Signs or symptoms of abuse/neglect since last visito No Secondary Verification Process Completed: Yes Hospitalized since last visit: No Patient Requires Transmission-Based Precautions: No Implantable device outside of the clinic excluding No Patient Has Alerts: Yes cellular tissue based products placed in the center Patient Alerts: ABI L 1.16 02/19/22 since last visit: Has Dressing in Place as Prescribed: Yes Has Compression in Place as Prescribed: Yes Pain Present Now: No Electronic Signature(s) Signed: 02/18/2023 4:03:28 PM By: Samuella Bruin Entered By: Samuella Bruin on 02/18/2023 10:48:26 -------------------------------------------------------------------------------- Encounter Discharge Information Details Patient Name: Date of Service: [name] [name] 02/18/2023 10:45 A M Medical Record Number: [id number] Patient Account Number: [id number] Date of Birth/Sex: Treating RN: Dec 06, 1945 (77 y.o. [name] Primary Care Alexianna Nachreiner: Dina Rich Other Clinician: Referring Carleah Yablonski: Treating Jestina Stephani/Extender: Pincus Badder in Treatment: 3 Encounter Discharge Information Items Post Procedure Vitals Discharge Condition: Stable Temperature (F): 98.2 Ambulatory Status: Cane Pulse (bpm): 98 Discharge Destination: Home Respiratory Rate (breaths/min): 16 Transportation: Private Auto Blood Pressure (mmHg): 136/83 Accompanied By: husband Schedule Follow-up Appointment: Yes Clinical Summary of Care: Patient Declined Electronic Signature(s) Signed: 02/18/2023 4:03:28 PM By: Samuella Bruin Entered By: Samuella Bruin on 02/18/2023 11:16:58 -------------------------------------------------------------------------------- Lower Extremity Assessment Details Patient Name: Date of Service: MALYA, CIRILLO [name] 02/18/2023 10:45 A M Medical Record Number: [id number] Patient Account Number: [id number] Date of Birth/Sex: Treating RN: 02-Jul-1946 (77 y.o. [name] Primary Care Coleton Woon: Dina Rich Other Clinician: Referring Kamyah Wilhelmsen: Treating Tilford Deaton/Extender: Pincus Badder in Treatment: 3 Edema Assessment B[Left: [name], [name] ([id number])] [name]: [id number] Page 2 of 8] Assessed: [Left: No] [Right: No] Edema: [Left: N] [Right: o] Calf Left: Right: Point of Measurement: 25 cm From Medial Instep 29 cm Ankle Left: Right: Point of Measurement: 10 cm From Medial Instep 18 cm Vascular Assessment Pulses: Dorsalis Pedis Palpable: [Left:Yes] Electronic Signature(s) Signed: 02/18/2023 4:03:28 PM By: Samuella Bruin Entered By: Samuella Bruin on 02/18/2023 10:54:04 -------------------------------------------------------------------------------- Multi Wound Chart Details Patient Name: Date of Service: [name] [name] 02/18/2023 10:45 A M Medical Record Number: [id number] Patient Account Number: [id number] Date of Birth/Sex: Treating RN: 17-May-1946 (77 y.o. F) Primary Care Keion Neels: Dina Rich Other  Clinician: Referring Audric Venn: Treating Amire Leazer/Extender: Pincus Badder in Treatment: 3 Vital Signs Height(in): 63 Pulse(bpm): 98 Weight(lbs): 115 Blood Pressure(mmHg): 136/83 Body Mass Index(BMI): 20.4 Temperature(F): 98.2 Respiratory Rate(breaths/min): 16 [4:Photos:] [N/A:N/A] Left, Anterior Lower Leg N/A N/A Wound Location: Trauma N/A N/A Wounding Event: Venous Leg Ulcer N/A N/A Primary Etiology: Cataracts, Anemia, Chronic N/A N/A Comorbid History: Obstructive Pulmonary Disease (COPD), Coronary Artery Disease, Peripheral Venous Disease, Raynauds, Rheumatoid Arthritis, Osteoarthritis 11/27/2022 N/A N/A Date Acquired: 3 N/A N/A Weeks of Treatment: Open N/A N/A Wound Status: No N/A N/A Wound Recurrence: 2.7x3.4x0.1 N/A N/A Measurements L x W  x D (cm) 7.21 N/A N/A A (cm) : rea 0.721 N/A N/A Volume (cm) : 17.30% N/A N/A % Reduction in Area: 17.30% N/A N/A % Reduction in Volume: Full Thickness Without Exposed N/A N/A Classification: Support Structures Medium N/A N/A Exudate Amount: Serosanguineous N/A N/A Exudate Type: red, brown N/A N/A Exudate Color: Distinct, outline attached N/A N/A Wound MarginMARCELE, [name] ([id number]) [id number] Page 3 of 8 Medium (34-66%) N/A N/A Granulation Amount: Red, Pink N/A N/A Granulation Quality: Medium (34-66%) N/A N/A Necrotic Amount: Fat Layer (Subcutaneous Tissue): Yes N/A N/A Exposed Structures: Fascia: No Tendon: No Muscle: No Joint: No Bone: No Small (1-33%) N/A N/A Epithelialization: Debridement - Selective/Open Wound N/A N/A Debridement: Pre-procedure Verification/Time Out 10:58 N/A N/A Taken: Lidocaine 4% Topical Solution N/A N/A Pain Control: Necrotic/Eschar, Slough N/A N/A Tissue Debrided: Non-Viable Tissue N/A N/A Level: 6.49 N/A N/A Debridement A (sq cm): rea Curette N/A N/A Instrument: Minimum N/A N/A Bleeding: Pressure N/A  N/A Hemostasis A chieved: Procedure was tolerated well N/A N/A Debridement Treatment Response: 2.7x3.4x0.1 N/A N/A Post Debridement Measurements L x W x D (cm) 0.721 N/A N/A Post Debridement Volume: (cm) Excoriation: Yes N/A N/A Periwound Skin Texture: Induration: No Callus: No Crepitus: No Rash: No Scarring: No Maceration: Yes N/A N/A Periwound Skin Moisture: Dry/Scaly: No Erythema: Yes N/A N/A Periwound Skin Color: Hemosiderin Staining: Yes Atrophie Blanche: No Cyanosis: No Ecchymosis: No Mottled: No Pallor: No Rubor: No Circumferential N/A N/A Erythema Location: Measured: 2cm N/A N/A Erythema Measurement: No Abnormality N/A N/A Temperature: Debridement N/A N/A Procedures Performed: Treatment Notes Wound #4 (Lower Leg) Wound Laterality: Left, Anterior Cleanser Soap and Water Discharge Instruction: May shower and wash wound with dial antibacterial soap and water prior to dressing change. Vashe 5.8 (oz) Discharge Instruction: Cleanse the wound with Vashe prior to applying a clean dressing using gauze sponges, not tissue or cotton balls. Peri-Wound Care Triamcinolone 15 (g) Discharge Instruction: mix with the zinc to periwound. Sween Lotion (Moisturizing lotion) Discharge Instruction: Apply moisturizing lotion as directed Topical Gentamicin Discharge Instruction: As directed by physician Mupirocin Ointment Discharge Instruction: Apply Mupirocin (Bactroban) as instructed Primary Dressing Cutimed Sorbact Swab Discharge Instruction: Apply to wound bed as instructed Secondary Dressing ABD Pad, 8x10 Discharge Instruction: Apply over primary dressing as directed. Secured With Compression Wrap Kerlix Roll 4.5x3.1 (in/yd) Discharge Instruction: Apply Kerlix and Coban compression as directed. [name], [name] ([id number]) [id number] Page 4 of 8 Coban Self-Adherent Wrap 4x5 (in/yd) Discharge Instruction: Apply over Kerlix as  directed. Compression Stockings Add-Ons Electronic Signature(s) Signed: 02/18/2023 12:09:01 PM By: Duanne Guess MD FACS Entered By: Duanne Guess on 02/18/2023 12:09:01 -------------------------------------------------------------------------------- Multi-Disciplinary Care Plan Details Patient Name: Date of Service: [name] [name] 02/18/2023 10:45 A M Medical Record Number: [id number] Patient Account Number: [id number] Date of Birth/Sex: Treating RN: 1946-02-04 (77 y.o. [name] Primary Care Brynleigh Sequeira: Dina Rich Other Clinician: Referring Rickelle Sylvestre: Treating Maicol Bowland/Extender: Pincus Badder in Treatment: 3 Active Inactive Nutrition Nursing Diagnoses: Potential for alteratiion in Nutrition/Potential for imbalanced nutrition Goals: Patient/caregiver agrees to and verbalizes understanding of need to obtain nutritional consultation Date Initiated: 01/26/2023 Target Resolution Date: 03/06/2023 Goal Status: Active Patient/caregiver will maintain therapeutic glucose control Date Initiated: 01/26/2023 Target Resolution Date: 03/06/2023 Goal Status: Active Interventions: Assess patient nutrition upon admission and as needed per policy Provide education on nutrition Treatment Activities: Giving encouragement to exercise : 01/26/2023 Notes: Pain, Acute or Chronic Nursing Diagnoses: Pain, acute or chronic: actual or potential Potential alteration in comfort, pain Goals:  Patient will verbalize adequate pain control and receive pain control interventions during procedures as needed Date Initiated: 01/26/2023 Target Resolution Date: 03/06/2023 Goal Status: Active Patient/caregiver will verbalize comfort level met Date Initiated: 01/26/2023 Target Resolution Date: 03/06/2023 Goal Status: Active Interventions: Complete pain assessment as per visit requirements Encourage patient to take pain medications as prescribed Provide education on pain  management Treatment Activities: Administer pain control measures as ordered : 01/26/2023 Notes: Wound/Skin Impairment [name], [name] ([id number]) [id number] Page 5 of 8 Nursing Diagnoses: Knowledge deficit related to ulceration/compromised skin integrity Goals: Patient/caregiver will verbalize understanding of skin care regimen Date Initiated: 01/26/2023 Target Resolution Date: 03/06/2023 Goal Status: Active Interventions: Assess patient/caregiver ability to perform ulcer/skin care regimen upon admission and as needed Assess ulceration(s) every visit Provide education on ulcer and skin care Treatment Activities: Skin care regimen initiated : 01/26/2023 Topical wound management initiated : 01/26/2023 Notes: Electronic Signature(s) Signed: 02/18/2023 4:03:28 PM By: Samuella Bruin Entered By: Samuella Bruin on 02/18/2023 11:00:31 -------------------------------------------------------------------------------- Pain Assessment Details Patient Name: Date of Service: [name] [name] 02/18/2023 10:45 A M Medical Record Number: [id number] Patient Account Number: [id number] Date of Birth/Sex: Treating RN: 01-23-1946 (77 y.o. [name] Primary Care Tatiyana Foucher: Dina Rich Other Clinician: Referring Harvin Konicek: Treating Denali Becvar/Extender: Pincus Badder in Treatment: 3 Active Problems Location of Pain Severity and Description of Pain Patient Has Paino No Site Locations Rate the pain. Current Pain Level: 0 Pain Management and Medication Current Pain Management: Electronic Signature(s) Signed: 02/18/2023 4:03:28 PM By: Samuella Bruin Entered By: Samuella Bruin on 02/18/2023 10:53:55 -------------------------------------------------------------------------------- Patient/Caregiver Education Details Patient Name: Date of Service: [name] [name] 4/24/2024andnbsp10:45 A M Medical Record Number: [id number] Patient Account  Number: [id number] [name], [name] ([id number]) [id number] Page 6 of 8 Date of Birth/Gender: Treating RN: Feb 20, 1946 (77 y.o. [name] Primary Care Physician: Dina Rich Other Clinician: Referring Physician: Treating Physician/Extender: Pincus Badder in Treatment: 3 Education Assessment Education Provided To: Patient Education Topics Provided Wound/Skin Impairment: Methods: Explain/Verbal Responses: Reinforcements needed, State content correctly Electronic Signature(s) Signed: 02/18/2023 4:03:28 PM By: Samuella Bruin Entered By: Samuella Bruin on 02/18/2023 11:00:46 -------------------------------------------------------------------------------- Wound Assessment Details Patient Name: Date of Service: [name] [name] 02/18/2023 10:45 A M Medical Record Number: [id number] Patient Account Number: [id number] Date of Birth/Sex: Treating RN: 22-Dec-1945 (77 y.o. [name] Primary Care Nyonna Hargrove: Dina Rich Other Clinician: Referring Platon Arocho: Treating Sharnita Bogucki/Extender: Pincus Badder in Treatment: 3 Wound Status Wound Number: 4 Primary Venous Leg Ulcer Etiology: Wound Location: Left, Anterior Lower Leg Wound Open Wounding Event: Trauma Status: Date Acquired: 11/27/2022 Comorbid Cataracts, Anemia, Chronic Obstructive Pulmonary Disease Weeks Of Treatment: 3 History: (COPD), Coronary Artery Disease, Peripheral Venous Disease, Clustered Wound: No Raynauds, Rheumatoid Arthritis, Osteoarthritis Photos Wound Measurements Length: (cm) 2.7 Width: (cm) 3.4 Depth: (cm) 0.1 Area: (cm) 7.21 Volume: (cm) 0.721 % Reduction in Area: 17.3% % Reduction in Volume: 17.3% Epithelialization: Small (1-33%) Tunneling: No Undermining: No Wound Description Classification: Full Thickness Without Exposed Suppo Wound Margin: Distinct, outline attached Exudate Amount: Medium Exudate Type:  Serosanguineous Exudate Color: red, brown rt Structures Foul Odor After Cleansing: No Slough/Fibrino Yes Wound Bed Granulation Amount: Medium (34-66%) Exposed Structure Granulation Quality: Red, Pink Fascia Exposed: No [name], [name] K ([id number]) [id number] Page 7 of 8 Necrotic Amount: Medium (34-66%) Fat Layer (Subcutaneous Tissue) Exposed: Yes Necrotic Quality: Adherent Slough Tendon Exposed: No Muscle Exposed: No Joint Exposed: No Bone Exposed: No Periwound Skin Texture Texture Color No Abnormalities  Noted: No No Abnormalities Noted: No Callus: No Atrophie Blanche: No Crepitus: No Cyanosis: No Excoriation: Yes Ecchymosis: No Induration: No Erythema: Yes Rash: No Erythema Location: Circumferential Scarring: No Erythema Measurement: Measured 2 cm Moisture Hemosiderin Staining: Yes No Abnormalities Noted: Yes Mottled: No Pallor: No Rubor: No Temperature / Pain Temperature: No Abnormality Treatment Notes Wound #4 (Lower Leg) Wound Laterality: Left, Anterior Cleanser Soap and Water Discharge Instruction: May shower and wash wound with dial antibacterial soap and water prior to dressing change. Vashe 5.8 (oz) Discharge Instruction: Cleanse the wound with Vashe prior to applying a clean dressing using gauze sponges, not tissue or cotton balls. Peri-Wound Care Triamcinolone 15 (g) Discharge Instruction: mix with the zinc to periwound. Sween Lotion (Moisturizing lotion) Discharge Instruction: Apply moisturizing lotion as directed Topical Gentamicin Discharge Instruction: As directed by physician Mupirocin Ointment Discharge Instruction: Apply Mupirocin (Bactroban) as instructed Primary Dressing Cutimed Sorbact Swab Discharge Instruction: Apply to wound bed as instructed Secondary Dressing ABD Pad, 8x10 Discharge Instruction: Apply over primary dressing as directed. Secured With Compression Wrap Kerlix Roll 4.5x3.1 (in/yd) Discharge  Instruction: Apply Kerlix and Coban compression as directed. Coban Self-Adherent Wrap 4x5 (in/yd) Discharge Instruction: Apply over Kerlix as directed. Compression Stockings Add-Ons Electronic Signature(s) Signed: 02/18/2023 4:03:28 PM By: Samuella Bruin Entered By: Samuella Bruin on 02/18/2023 10:55:58 [name] ([id number]) [id number] Page 8 of 8 -------------------------------------------------------------------------------- Vitals Details Patient Name: Date of Service: [name], [name] [name] 02/18/2023 10:45 A M Medical Record Number: [id number] Patient Account Number: [id number] Date of Birth/Sex: Treating RN: 1946/04/14 (77 y.o. [name] Primary Care Clydell Alberts: Dina Rich Other Clinician: Referring Kenyah Luba: Treating Osceola Holian/Extender: Pincus Badder in Treatment: 3 Vital Signs Time Taken: 10:48 Temperature (F): 98.2 Height (in): 63 Pulse (bpm): 98 Weight (lbs): 115 Respiratory Rate (breaths/min): 16 Body Mass Index (BMI): 20.4 Blood Pressure (mmHg): 136/83 Reference Range: 80 - 120 mg / dl Electronic Signature(s) Signed: 02/18/2023 4:03:28 PM By: Samuella Bruin Entered By: Samuella Bruin on 02/18/2023 10:48:41

## 2023-02-19 NOTE — Progress Notes (Signed)
ALETTA, EDMUNDS ([id number]) [id number] Page 1 of 10 Visit Report for 02/18/2023 Chief Complaint Document Details Patient Name: Date of Service: ANAEL, ROSCH [name] 02/18/2023 10:45 A M Medical Record Number: [id number] Patient Account Number: [id number] Date of Birth/Sex: Treating RN: 1946-09-07 (77 y.o. F) Primary Care Provider: Dina Rich Other Clinician: Referring Provider: Treating Provider/Extender: Pincus Badder in Treatment: 3 Information Obtained from: Patient Chief Complaint Patient seen for complaints of Non-Healing Wound. Electronic Signature(s) Signed: 02/18/2023 12:10:16 PM By: Duanne Guess MD FACS Entered By: Duanne Guess on 02/18/2023 12:10:16 -------------------------------------------------------------------------------- Debridement Details Patient Name: Date of Service: [name] 02/18/2023 10:45 A M Medical Record Number: [id number] Patient Account Number: [id number] Date of Birth/Sex: Treating RN: 08-27-1946 (77 y.o. [name] Primary Care Provider: Dina Rich Other Clinician: Referring Provider: Treating Provider/Extender: Pincus Badder in Treatment: 3 Debridement Performed for Assessment: Wound #4 Left,Anterior Lower Leg Performed By: Physician Duanne Guess, MD Debridement Type: Debridement Severity of Tissue Pre Debridement: Fat layer exposed Level of Consciousness (Pre-procedure): Awake and Alert Pre-procedure Verification/Time Out Yes - 10:58 Taken: Start Time: 10:58 Pain Control: Lidocaine 4% Topical Solution Percent of Wound Bed Debrided: 90% T Area Debrided (cm): otal 6.49 Tissue and other material debrided: Non-Viable, Eschar, Slough, Slough Level: Non-Viable Tissue Debridement Description: Selective/Open Wound Instrument: Curette Bleeding: Minimum Hemostasis Achieved: Pressure Response to Treatment: Procedure was tolerated well Level of  Consciousness (Post- Awake and Alert procedure): Post Debridement Measurements of Total Wound Length: (cm) 2.7 Width: (cm) 3.4 Depth: (cm) 0.1 Volume: (cm) 0.721 Character of Wound/Ulcer Post Debridement: Improved Severity of Tissue Post Debridement: Fat layer exposed Post Procedure Diagnosis Same as Pre-procedure Notes scribed for Dr. Lady Gary by Samuella Bruin, RN Electronic Signature(s) Signed: 02/18/2023 12:57:22 PM By: Duanne Guess MD FACS Signed: 02/18/2023 4:03:28 PM By: Alicia Amel ([id number]) [id number] Page 2 of 10 Entered By: Samuella Bruin on 02/18/2023 11:00:25 -------------------------------------------------------------------------------- HPI Details Patient Name: Date of Service: TRIA, NOGUERA [name] 02/18/2023 10:45 A M Medical Record Number: [id number] Patient Account Number: [id number] Date of Birth/Sex: Treating RN: October 14, 1946 (77 y.o. F) Primary Care Provider: Dina Rich Other Clinician: Referring Provider: Treating Provider/Extender: Pincus Badder in Treatment: 3 History of Present Illness HPI Description: ADMISSION 02/19/2022 This is a 77 year old woman with minimal pertinent medical history. She does have COPD and pulmonary hypertension, but is not diabetic and is not a smoker. About 6 months ago, she and her husband got a new beagle puppy. The puppy scratched her leg and the scratches ultimately deteriorated into ulcerations. Apparently she sought care with a dermatologist who recommended applying a one-to-one mixture of peroxide and water to the wounds followed by a thick layer of Vaseline. She continue this for some time but then saw her primary care provider who told her to discontinue the peroxide. She has not been on any antibiotics for the scratches. Today, there are 3 separate wounds on her anterior tibial surface on the left. They are tender and her leg has localized  swelling. There is yellow slough buildup in each of the wound bases. ABI in clinic today was normal at 1.16. 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 02/25/2022: Over the past week, the wounds themselves have not improved tremendously but she has noticed some stinging and the periwound skin is quite inflamed. I took a culture last week that had low levels of methicillin-resistant Staph aureus. Because it was fairly low level, I did  not prescribe an oral antibiotic. I had planned to change her to mupirocin today, however. We have been using Santyl under Hydrofera Blue with 3 layer compression. 03/04/2022: The wounds have improved quite a bit over the past week. They are less painful and red. She has been taking the prescribed doxycycline but says that it gives her a stomachache. We have been using mupirocin with Hydrofera Blue and 3 layer compression. 03/11/2022: Continued improvement of the wounds. They have contracted quite a bit and have a good surface of healthy granulation tissue present. There is some dried eschar present around all 3 of the lesions. 03/17/2022: No significant change in the wounds from last week. The surface is clean with just a little bit of eschar and slough accumulation. No odor or drainage. No concern for infection. 03/26/2022: All of her wounds are smaller this week. There is good granulation tissue on the surface of each. No slough accumulation. There is just some dry skin around the wounds but not actually involving the wounds. No concern for infection. 04/04/2022: The 2 proximal wounds have healed. The distal wound is much smaller and just has a little bit of dry eschar around the edges. 04/11/2022: Her wound is nearly healed. It is clean without concern for infection. 04/18/2022: Her wound has healed. READMISSION 01/26/2023 About 8 weeks ago, she sustained another scratch from her Beagle on her left anterior tibial surface.. She has been trying to treat  the wound at home with Vaseline. There is thick slough buildup on the wound surface and the periwound skin looks a bit macerated. No overt sign of infection. 01/29/2023: She came in for an unscheduled visit today because her wound began bleeding. It apparently bled quite profusely and her husband used an over-the- counter topical agent to get it to stop. On inspection today, he managed to achieve good hemostasis and there is no ongoing blood loss. 02/03/2023: No further issues with bleeding. There is minimal slough accumulation. Some hypertrophic granulation tissue present. 02/11/2023: She has had a lot more drainage over the past week and there has been more breakdown of the skin around her wound. The wound itself has expanded and there is quite a bit of slough on the surfaces. 02/18/2023: Her wound looks much better this week. The drainage has decreased and the periwound skin is in much improved condition. The wound is smaller and there is just 1 tiny satellite adjacent to the main wound. There is slough accumulation on the surfaces. The culture that I took last week returned with methicillin sensitive Staph aureus. She is currently taking Keflex for this. Electronic Signature(s) Signed: 02/18/2023 12:11:51 PM By: Duanne Guess MD FACS Entered By: Duanne Guess on 02/18/2023 12:11:51 -------------------------------------------------------------------------------- Physical Exam Details Patient Name: Date of Service: [name] 02/18/2023 10:45 A M Medical Record Number: [id number] Patient Account Number: [id number] Date of Birth/Sex: Treating RN: 12-22-1945 (77 y.o. F) Primary Care Provider: Dina Rich Other Clinician: Referring Provider: Treating Provider/Extender: Pincus Badder in Treatment: 3 Slone, [name] ([id number]) [id number] Page 3 of 10 Constitutional . . . . no acute distress. Respiratory Normal work of breathing on room  air. Notes 02/18/2023: Her wound looks much better this week. The drainage has decreased and the periwound skin is in much improved condition. The wound is smaller and there is just 1 tiny satellite adjacent to the main wound. There is slough accumulation on the surfaces. Electronic Signature(s) Signed: 02/18/2023 12:12:53 PM By: Duanne Guess MD FACS Entered By:  Duanne Guess on 02/18/2023 12:12:53 -------------------------------------------------------------------------------- Physician Orders Details Patient Name: Date of Service: LINETH, THIELKE [name] 02/18/2023 10:45 A M Medical Record Number: [id number] Patient Account Number: [id number] Date of Birth/Sex: Treating RN: Mar 22, 1946 (77 y.o. [name] Primary Care Provider: Dina Rich Other Clinician: Referring Provider: Treating Provider/Extender: Pincus Badder in Treatment: 3 Verbal / Phone Orders: No Diagnosis Coding ICD-10 Coding Code Description [phone] Non-pressure chronic ulcer of other part of left lower leg with fat layer exposed I27.20 Pulmonary hypertension, unspecified I51.9 Heart disease, unspecified Follow-up Appointments ppointment in 1 week. - Dr. Lady Gary Room 2 Return A Anesthetic (In clinic) Topical Lidocaine 4% applied to wound bed Bathing/ Shower/ Hygiene May shower with protection but do not get wound dressing(s) wet. Protect dressing(s) with water repellant cover (for example, large plastic bag) or a cast cover and may then take shower. Edema Control - Lymphedema / SCD / Other Elevate legs to the level of the heart or above for 30 minutes daily and/or when sitting for 3-4 times a day throughout the day. A void standing for long periods of time. Exercise regularly If compression wraps slide down please call wound center and speak with a nurse. Wound Treatment Wound #4 - Lower Leg Wound Laterality: Left, Anterior Cleanser: Soap and Water 1 x Per Week/30 Days Discharge  Instructions: May shower and wash wound with dial antibacterial soap and water prior to dressing change. Cleanser: Vashe 5.8 (oz) 1 x Per Week/30 Days Discharge Instructions: Cleanse the wound with Vashe prior to applying a clean dressing using gauze sponges, not tissue or cotton balls. Peri-Wound Care: Triamcinolone 15 (g) 1 x Per Week/30 Days Discharge Instructions: mix with the zinc to periwound. Peri-Wound Care: Sween Lotion (Moisturizing lotion) 1 x Per Week/30 Days Discharge Instructions: Apply moisturizing lotion as directed Topical: Gentamicin 1 x Per Week/30 Days Discharge Instructions: As directed by physician Topical: Mupirocin Ointment 1 x Per Week/30 Days Discharge Instructions: Apply Mupirocin (Bactroban) as instructed Prim Dressing: Cutimed Sorbact Swab 1 x Per Week/30 Days ary Discharge Instructions: Apply to wound bed as instructed Secondary Dressing: ABD Pad, 8x10 1 x Per Week/30 Days [name], [name] ([id number]) [id number] Page 4 of 10 Discharge Instructions: Apply over primary dressing as directed. Compression Wrap: Kerlix Roll 4.5x3.1 (in/yd) 1 x Per Week/30 Days Discharge Instructions: Apply Kerlix and Coban compression as directed. Compression Wrap: Coban Self-Adherent Wrap 4x5 (in/yd) 1 x Per Week/30 Days Discharge Instructions: Apply over Kerlix as directed. Patient Medications llergies: Sulfa (Sulfonamide Antibiotics), adhesive, amoxicillin, ciprofloxacin, Statins-Hmg-Coa Reductase Inhibitors A Notifications Medication Indication Start End 02/18/2023 lidocaine DOSE topical 4 % cream - cream topical Electronic Signature(s) Signed: 02/18/2023 12:57:22 PM By: Duanne Guess MD FACS Entered By: Duanne Guess on 02/18/2023 12:13:08 -------------------------------------------------------------------------------- Problem List Details Patient Name: Date of Service: [name] 02/18/2023 10:45 A M Medical Record Number:  [id number] Patient Account Number: [id number] Date of Birth/Sex: Treating RN: 1946-09-23 (77 y.o. F) Primary Care Provider: Dina Rich Other Clinician: Referring Provider: Treating Provider/Extender: Pincus Badder in Treatment: 3 Active Problems ICD-10 Encounter Code Description Active Date MDM Diagnosis [phone] Non-pressure chronic ulcer of other part of left lower leg with fat layer exposed4/10/2022 No Yes I27.20 Pulmonary hypertension, unspecified 01/26/2023 No Yes I51.9 Heart disease, unspecified 01/26/2023 No Yes Inactive Problems Resolved Problems Electronic Signature(s) Signed: 02/18/2023 12:08:54 PM By: Duanne Guess MD FACS Entered By: Duanne Guess on 02/18/2023 12:08:54 -------------------------------------------------------------------------------- Progress Note Details Patient Name: Date of Service: Vanzandt, NO RMA  K. 02/18/2023 10:45 A M Medical Record Number: [id number] Patient Account Number: [id number] Date of Birth/Sex: Treating RN: 09/16/1946 (77 y.o. F) Primary Care Provider: Dina Rich Other Clinician: Referring Provider: Treating Provider/Extender: Pincus Badder in Treatment: 3 Favorite, [name] ([id number]) [id number] Page 5 of 10 Subjective Chief Complaint Information obtained from Patient Patient seen for complaints of Non-Healing Wound. History of Present Illness (HPI) ADMISSION 02/19/2022 This is a 77 year old woman with minimal pertinent medical history. She does have COPD and pulmonary hypertension, but is not diabetic and is not a smoker. About 6 months ago, she and her husband got a new beagle puppy. The puppy scratched her leg and the scratches ultimately deteriorated into ulcerations. Apparently she sought care with a dermatologist who recommended applying a one-to-one mixture of peroxide and water to the wounds followed by a thick layer of Vaseline. She continue this for  some time but then saw her primary care provider who told her to discontinue the peroxide. She has not been on any antibiotics for the scratches. Today, there are 3 separate wounds on her anterior tibial surface on the left. They are tender and her leg has localized swelling. There is yellow slough buildup in each of the wound bases. ABI in clinic today was normal at 1.16. 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 02/25/2022: Over the past week, the wounds themselves have not improved tremendously but she has noticed some stinging and the periwound skin is quite inflamed. I took a culture last week that had low levels of methicillin-resistant Staph aureus. Because it was fairly low level, I did not prescribe an oral antibiotic. I had planned to change her to mupirocin today, however. We have been using Santyl under Hydrofera Blue with 3 layer compression. 03/04/2022: The wounds have improved quite a bit over the past week. They are less painful and red. She has been taking the prescribed doxycycline but says that it gives her a stomachache. We have been using mupirocin with Hydrofera Blue and 3 layer compression. 03/11/2022: Continued improvement of the wounds. They have contracted quite a bit and have a good surface of healthy granulation tissue present. There is some dried eschar present around all 3 of the lesions. 03/17/2022: No significant change in the wounds from last week. The surface is clean with just a little bit of eschar and slough accumulation. No odor or drainage. No concern for infection. 03/26/2022: All of her wounds are smaller this week. There is good granulation tissue on the surface of each. No slough accumulation. There is just some dry skin around the wounds but not actually involving the wounds. No concern for infection. 04/04/2022: The 2 proximal wounds have healed. The distal wound is much smaller and just has a little bit of dry eschar around the  edges. 04/11/2022: Her wound is nearly healed. It is clean without concern for infection. 04/18/2022: Her wound has healed. READMISSION 01/26/2023 About 8 weeks ago, she sustained another scratch from her Beagle on her left anterior tibial surface.. She has been trying to treat the wound at home with Vaseline. There is thick slough buildup on the wound surface and the periwound skin looks a bit macerated. No overt sign of infection. 01/29/2023: She came in for an unscheduled visit today because her wound began bleeding. It apparently bled quite profusely and her husband used an over-the- counter topical agent to get it to stop. On inspection today, he managed to achieve good hemostasis and there is  no ongoing blood loss. 02/03/2023: No further issues with bleeding. There is minimal slough accumulation. Some hypertrophic granulation tissue present. 02/11/2023: She has had a lot more drainage over the past week and there has been more breakdown of the skin around her wound. The wound itself has expanded and there is quite a bit of slough on the surfaces. 02/18/2023: Her wound looks much better this week. The drainage has decreased and the periwound skin is in much improved condition. The wound is smaller and there is just 1 tiny satellite adjacent to the main wound. There is slough accumulation on the surfaces. The culture that I took last week returned with methicillin sensitive Staph aureus. She is currently taking Keflex for this. Patient History Information obtained from Patient, Caregiver. Family History Unknown History. Social History Former smoker, Marital Status - Married, Alcohol Use - Never, Drug Use - No History, Caffeine Use - Rarely. Medical History Eyes Patient has history of Cataracts - bil removed Denies history of Glaucoma, Optic Neuritis Ear/Nose/Mouth/Throat Denies history of Chronic sinus problems/congestion, Middle ear problems Hematologic/Lymphatic Patient has history of  Anemia Respiratory Patient has history of Chronic Obstructive Pulmonary Disease (COPD) Cardiovascular Patient has history of Coronary Artery Disease, Peripheral Venous Disease - varicose veins Endocrine Denies history of Type I Diabetes, Type II Diabetes Genitourinary Denies history of End Stage Renal Disease Immunological Patient has history of Raynaudoos Denies history of Lupus Erythematosus, Scleroderma Integumentary (Skin) Denies history of History of Burn Musculoskeletal Patient has history of Rheumatoid Arthritis, Osteoarthritis Denies history of Gout MARTAVIA, TYE K ([id number]) [id number] Page 6 of 10 Oncologic Denies history of Received Chemotherapy, Received Radiation Psychiatric Denies history of Anorexia/bulimia, Confinement Anxiety Hospitalization/Surgery History - bil cataract removal. - left shoiulder replacement. - right rotator cuff repair. - multiple lumbar spine surgeries. - melanoma removed left arm and chest. - partial excision bone left 2nd toe. Medical A Surgical History Notes nd Hematologic/Lymphatic varicose vein of leg, mixed hyperlipidemia Respiratory dyspnea, chronic bronchitis, pulmonary hypertension, pulmonary emphysema Cardiovascular aortic calcification, tachycardia, left ventricular dysfunction, bradycardia Gastrointestinal Gerd Endocrine prediabetes, hypothyroidism Genitourinary bladder disorder Integumentary (Skin) contact dermatitis Musculoskeletal acquired plantar porokeratosis, neurogenic claudication d/t lumbar spinal stenosis, supraspinatus tendinitis, restless leg syndrome, spinal stenosis of lumbar region Neurologic TIA, insomnia Objective Constitutional no acute distress. Vitals Time Taken: 10:48 AM, Height: 63 in, Weight: 115 lbs, BMI: 20.4, Temperature: 98.2 F, Pulse: 98 bpm, Respiratory Rate: 16 breaths/min, Blood Pressure: 136/83 mmHg. Respiratory Normal work of breathing on room  air. General Notes: 02/18/2023: Her wound looks much better this week. The drainage has decreased and the periwound skin is in much improved condition. The wound is smaller and there is just 1 tiny satellite adjacent to the main wound. There is slough accumulation on the surfaces. Integumentary (Hair, Skin) Wound #4 status is Open. Original cause of wound was Trauma. The date acquired was: 11/27/2022. The wound has been in treatment 3 weeks. The wound is located on the Left,Anterior Lower Leg. The wound measures 2.7cm length x 3.4cm width x 0.1cm depth; 7.21cm^2 area and 0.721cm^3 volume. There is Fat Layer (Subcutaneous Tissue) exposed. There is no tunneling or undermining noted. There is a medium amount of serosanguineous drainage noted. The wound margin is distinct with the outline attached to the wound base. There is medium (34-66%) red, pink granulation within the wound bed. There is a medium (34-66%) amount of necrotic tissue within the wound bed including Adherent Slough. The periwound skin appearance had no abnormalities noted for moisture. The periwound  skin appearance exhibited: Excoriation, Hemosiderin Staining, Erythema. The periwound skin appearance did not exhibit: Callus, Crepitus, Induration, Rash, Scarring, Atrophie Blanche, Cyanosis, Ecchymosis, Mottled, Pallor, Rubor. The surrounding wound skin color is noted with erythema which is circumferential. Erythema is measured at 2 cm. Periwound temperature was noted as No Abnormality. Assessment Active Problems ICD-10 Non-pressure chronic ulcer of other part of left lower leg with fat layer exposed Pulmonary hypertension, unspecified Heart disease, unspecified Procedures Wound #4 Pre-procedure diagnosis of Wound #4 is a Venous Leg Ulcer located on the Left,Anterior Lower Leg .Severity of Tissue Pre Debridement is: Fat layer exposed. There was a Selective/Open Wound Non-Viable Tissue Debridement with a total area of 6.49 sq cm performed  by Duanne Guess, MD. With the following instrument(s): Curette to remove Non-Viable tissue/material. Material removed includes Eschar and Slough and after achieving pain control using Lidocaine 4% VERONDA, GABOR ([id number]) [id number] Page 7 of 10 Topical Solution. No specimens were taken. A time out was conducted at 10:58, prior to the start of the procedure. A Minimum amount of bleeding was controlled with Pressure. The procedure was tolerated well. Post Debridement Measurements: 2.7cm length x 3.4cm width x 0.1cm depth; 0.721cm^3 volume. Character of Wound/Ulcer Post Debridement is improved. Severity of Tissue Post Debridement is: Fat layer exposed. Post procedure Diagnosis Wound #4: Same as Pre-Procedure General Notes: scribed for Dr. Lady Gary by Samuella Bruin, RN. Plan Follow-up Appointments: Return Appointment in 1 week. - Dr. Lady Gary Room 2 Anesthetic: (In clinic) Topical Lidocaine 4% applied to wound bed Bathing/ Shower/ Hygiene: May shower with protection but do not get wound dressing(s) wet. Protect dressing(s) with water repellant cover (for example, large plastic bag) or a cast cover and may then take shower. Edema Control - Lymphedema / SCD / Other: Elevate legs to the level of the heart or above for 30 minutes daily and/or when sitting for 3-4 times a day throughout the day. Avoid standing for long periods of time. Exercise regularly If compression wraps slide down please call wound center and speak with a nurse. The following medication(s) was prescribed: lidocaine topical 4 % cream cream topical was prescribed at facility WOUND #4: - Lower Leg Wound Laterality: Left, Anterior Cleanser: Soap and Water 1 x Per Week/30 Days Discharge Instructions: May shower and wash wound with dial antibacterial soap and water prior to dressing change. Cleanser: Vashe 5.8 (oz) 1 x Per Week/30 Days Discharge Instructions: Cleanse the wound with Vashe prior to  applying a clean dressing using gauze sponges, not tissue or cotton balls. Peri-Wound Care: Triamcinolone 15 (g) 1 x Per Week/30 Days Discharge Instructions: mix with the zinc to periwound. Peri-Wound Care: Sween Lotion (Moisturizing lotion) 1 x Per Week/30 Days Discharge Instructions: Apply moisturizing lotion as directed Topical: Gentamicin 1 x Per Week/30 Days Discharge Instructions: As directed by physician Topical: Mupirocin Ointment 1 x Per Week/30 Days Discharge Instructions: Apply Mupirocin (Bactroban) as instructed Prim Dressing: Cutimed Sorbact Swab 1 x Per Week/30 Days ary Discharge Instructions: Apply to wound bed as instructed Secondary Dressing: ABD Pad, 8x10 1 x Per Week/30 Days Discharge Instructions: Apply over primary dressing as directed. Com pression Wrap: Kerlix Roll 4.5x3.1 (in/yd) 1 x Per Week/30 Days Discharge Instructions: Apply Kerlix and Coban compression as directed. Com pression Wrap: Coban Self-Adherent Wrap 4x5 (in/yd) 1 x Per Week/30 Days Discharge Instructions: Apply over Kerlix as directed. 02/18/2023: Her wound looks much better this week. The drainage has decreased and the periwound skin is in much improved condition. The wound is smaller  and there is just 1 tiny satellite adjacent to the main wound. There is slough accumulation on the surfaces. I used a curette to debride slough and eschar from the wound. We will continue topical gentamicin and mupirocin with Sorbact and Kerlix and Coban wrap. She will complete the course of oral Keflex. Follow-up in 1 week. Electronic Signature(s) Signed: 02/18/2023 12:13:39 PM By: Duanne Guess MD FACS Entered By: Duanne Guess on 02/18/2023 12:13:39 -------------------------------------------------------------------------------- HxROS Details Patient Name: Date of Service: [name] 02/18/2023 10:45 A M Medical Record Number: [id number] Patient Account Number: [id number] Date of Birth/Sex: Treating  RN: 02/08/1946 (77 y.o. F) Primary Care Provider: Dina Rich Other Clinician: Referring Provider: Treating Provider/Extender: Pincus Badder in Treatment: 3 Information Obtained From Patient Caregiver Eyes Medical History: Positive for: Cataracts - bil removed Negative for: Glaucoma; Optic Neuritis TEZRA, MAHR ([id number]) [id number] Page 8 of 10 Ear/Nose/Mouth/Throat Medical History: Negative for: Chronic sinus problems/congestion; Middle ear problems Hematologic/Lymphatic Medical History: Positive for: Anemia Past Medical History Notes: varicose vein of leg, mixed hyperlipidemia Respiratory Medical History: Positive for: Chronic Obstructive Pulmonary Disease (COPD) Past Medical History Notes: dyspnea, chronic bronchitis, pulmonary hypertension, pulmonary emphysema Cardiovascular Medical History: Positive for: Coronary Artery Disease; Peripheral Venous Disease - varicose veins Past Medical History Notes: aortic calcification, tachycardia, left ventricular dysfunction, bradycardia Gastrointestinal Medical History: Past Medical History Notes: Gerd Endocrine Medical History: Negative for: Type I Diabetes; Type II Diabetes Past Medical History Notes: prediabetes, hypothyroidism Genitourinary Medical History: Negative for: End Stage Renal Disease Past Medical History Notes: bladder disorder Immunological Medical History: Positive for: Raynauds Negative for: Lupus Erythematosus; Scleroderma Integumentary (Skin) Medical History: Negative for: History of Burn Past Medical History Notes: contact dermatitis Musculoskeletal Medical History: Positive for: Rheumatoid Arthritis; Osteoarthritis Negative for: Gout Past Medical History Notes: acquired plantar porokeratosis, neurogenic claudication d/t lumbar spinal stenosis, supraspinatus tendinitis, restless leg syndrome, spinal stenosis of  lumbar region Neurologic Medical History: Past Medical History Notes: TIA, insomnia Oncologic Medical History: Negative for: Received Chemotherapy; Received Radiation Psychiatric RYVER, ZADROZNY ([id number]) [id number] Page 9 of 10 Medical History: Negative for: Anorexia/bulimia; Confinement Anxiety HBO Extended History Items Eyes: Cataracts Immunizations Pneumococcal Vaccine: Received Pneumococcal Vaccination: Yes Received Pneumococcal Vaccination On or After 60th Birthday: Yes Implantable Devices None Hospitalization / Surgery History Type of Hospitalization/Surgery bil cataract removal left shoiulder replacement right rotator cuff repair multiple lumbar spine surgeries melanoma removed left arm and chest partial excision bone left 2nd toe Family and Social History Unknown History: Yes; Former smoker; Marital Status - Married; Alcohol Use: Never; Drug Use: No History; Caffeine Use: Rarely; Financial Concerns: No; Food, Clothing or Shelter Needs: No; Support System Lacking: No; Transportation Concerns: No Electronic Signature(s) Signed: 02/18/2023 12:57:22 PM By: Duanne Guess MD FACS Entered By: Duanne Guess on 02/18/2023 12:12:21 -------------------------------------------------------------------------------- SuperBill Details Patient Name: Date of Service: [name] 02/18/2023 Medical Record Number: [id number] Patient Account Number: [id number] Date of Birth/Sex: Treating RN: [birth year] (77 y.o. F) Primary Care Provider: Dina Rich Other Clinician: Referring Provider: Treating Provider/Extender: Pincus Badder in Treatment: 3 Diagnosis Coding ICD-10 Codes Code Description [id number] Non-pressure chronic ulcer of other part of left lower leg with fat layer exposed I27.20 Pulmonary hypertension, unspecified I51.9 Heart disease, unspecified Facility Procedures : CPT4 Code: [id number] Description: 97597 -  DEBRIDE WOUND 1ST 20 SQ CM OR < ICD-10 Diagnosis Description L97.822 Non-pressure chronic ulcer of other part of left lower leg with fat layer expose Modifier: d Quantity: 1 Physician Procedures :  CPT4 Code Description Modifier [id number] 99214 - WC PHYS LEVEL 4 - EST PT 25 ICD-10 Diagnosis Description L97.822 Non-pressure chronic ulcer of other part of left lower leg with fat layer exposed I27.20 Pulmonary hypertension, unspecified I51.9 Heart  disease, unspecified Quantity: 1 : [id number] 97597 - WC PHYS DEBR WO ANESTH 20 SQ CM ICD-10 Diagnosis Description NAELANI, LAFRANCE ([id number]) [id number] A21.308 Non-pressure chronic ulcer of other part of left lower leg with fat layer exposed Quantity: 1 7.pdf Page 10 of 10 Electronic Signature(s) Signed: 02/18/2023 12:13:53 PM By: Duanne Guess MD FACS Entered By: Duanne Guess on 02/18/2023 12:13:53

## 2023-02-25 ENCOUNTER — Encounter (HOSPITAL_BASED_OUTPATIENT_CLINIC_OR_DEPARTMENT_OTHER): Payer: PPO | Attending: General Surgery | Admitting: General Surgery

## 2023-02-25 DIAGNOSIS — J449 Chronic obstructive pulmonary disease, unspecified: Secondary | ICD-10-CM | POA: Diagnosis not present

## 2023-02-25 DIAGNOSIS — I272 Pulmonary hypertension, unspecified: Secondary | ICD-10-CM | POA: Insufficient documentation

## 2023-02-25 DIAGNOSIS — L97822 Non-pressure chronic ulcer of other part of left lower leg with fat layer exposed: Secondary | ICD-10-CM | POA: Insufficient documentation

## 2023-02-25 DIAGNOSIS — I519 Heart disease, unspecified: Secondary | ICD-10-CM | POA: Insufficient documentation

## 2023-02-25 DIAGNOSIS — Z87891 Personal history of nicotine dependence: Secondary | ICD-10-CM | POA: Insufficient documentation

## 2023-02-26 NOTE — Progress Notes (Signed)
[name], [name] ([id number]) [id number] Page 1 of 10 Visit Report for 02/25/2023 Chief Complaint Document Details Patient Name: Date of Service: [name], [name] RMA Long. 02/25/2023 2:00 PM Medical Record Number: [id number] Patient Account Number: [id number] Date of Birth/Sex: Treating RN: 1945-11-22 (77 y.o. F) Primary Care Provider: Dina Long Other Clinician: Referring Provider: Treating Provider/Extender: [name] in Treatment: 4 Information Obtained from: Patient Chief Complaint Patient seen for complaints of Non-Healing Wound. Electronic Signature(s) Signed: 02/25/2023 2:13:12 PM By: Elaine Guess MD FACS Entered By: [name] on 02/25/2023 14:13:12 -------------------------------------------------------------------------------- Debridement Details Patient Name: Date of Service: [name] RMA Long. 02/25/2023 2:00 PM Medical Record Number: [id number] Patient Account Number: [id number] Date of Birth/Sex: Treating RN: 04-Sep-1946 (77 y.o. [name] Primary Care Provider: Dina Long Other Clinician: Referring Provider: Treating Provider/Extender: [name] in Treatment: 4 Debridement Performed for Assessment: Wound #4 Left,Anterior Lower Leg Performed By: Physician Elaine Guess, MD Debridement Type: Debridement Severity of Tissue Pre Debridement: Fat layer exposed Level of Consciousness (Pre-procedure): Awake and Alert Pre-procedure Verification/Time Out Yes - 14:00 Taken: Start Time: 14:00 Pain Control: Lidocaine 4% T opical Solution Percent of Wound Bed Debrided: 100% T Area Debrided (cm): otal 3.18 Tissue and other material debrided: Non-Viable, Slough, Slough Level: Non-Viable Tissue Debridement Description: Selective/Open Wound Instrument: Curette Bleeding: Minimum Hemostasis Achieved: Pressure Response to Treatment: Procedure was tolerated well Level of Consciousness  (Post- Awake and Alert procedure): Post Debridement Measurements of Total Wound Length: (cm) 2.7 Width: (cm) 1.5 Depth: (cm) 0.1 Volume: (cm) 0.318 Character of Wound/Ulcer Post Debridement: Improved Severity of Tissue Post Debridement: Fat layer exposed Post Procedure Diagnosis Same as Pre-procedure Notes scribed for Dr. Lady Long by Samuella Bruin, RN Electronic Signature(s) Signed: 02/25/2023 2:16:14 PM By: Elaine Guess MD FACS Signed: 02/25/2023 3:37:17 PM By: [name] ([id number]) [id number] Page 2 of 10 Entered By: Samuella Long on 02/25/2023 14:01:46 -------------------------------------------------------------------------------- HPI Details Patient Name: Date of Service: [name] RMA Long. 02/25/2023 2:00 PM Medical Record Number: [id number] Patient Account Number: [id number] Date of Birth/Sex: Treating RN: [birth year] (77 y.o. F) Primary Care Provider: Dina Long Other Clinician: Referring Provider: Treating Provider/Extender: [name] in Treatment: 4 History of Present Illness HPI Description: ADMISSION 02/19/2022 This is a 77 year old woman with minimal pertinent medical history. She does have COPD and pulmonary hypertension, but is not diabetic and is not a smoker. About 6 months ago, she and her husband got a new beagle puppy. The puppy scratched her leg and the scratches ultimately deteriorated into ulcerations. Apparently she sought care with a dermatologist who recommended applying a one-to-one mixture of peroxide and water to the wounds followed by a thick layer of Vaseline. She continue this for some time but then saw her primary care provider who told her to discontinue the peroxide. She has not been on any antibiotics for the scratches. Today, there are 3 separate wounds on her anterior tibial surface on the left. They are tender and her leg has localized swelling. There is yellow  slough buildup in each of the wound bases. ABI in clinic today was normal at 1.16. 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 02/25/2022: Over the past week, the wounds themselves have not improved tremendously but she has noticed some stinging and the periwound skin is quite inflamed. I took a culture last week that had low levels of methicillin-resistant Staph aureus. Because it was fairly low level, I did not prescribe an  oral antibiotic. I had planned to change her to mupirocin today, however. We have been using Santyl under Hydrofera Blue with 3 layer compression. 03/04/2022: The wounds have improved quite a bit over the past week. They are less painful and red. She has been taking the prescribed doxycycline but says that it gives her a stomachache. We have been using mupirocin with Hydrofera Blue and 3 layer compression. 03/11/2022: Continued improvement of the wounds. They have contracted quite a bit and have a good surface of healthy granulation tissue present. There is some dried eschar present around all 3 of the lesions. 03/17/2022: Elaine significant change in the wounds from last week. The surface is clean with just a little bit of eschar and slough accumulation. Elaine odor or drainage. Elaine concern for infection. 03/26/2022: All of her wounds are smaller this week. There is good granulation tissue on the surface of each. Elaine slough accumulation. There is just some dry skin around the wounds but not actually involving the wounds. Elaine concern for infection. 04/04/2022: The 2 proximal wounds have healed. The distal wound is much smaller and just has a little bit of dry eschar around the edges. 04/11/2022: Her wound is nearly healed. It is clean without concern for infection. 04/18/2022: Her wound has healed. READMISSION 01/26/2023 About 8 weeks ago, she sustained another scratch from her Beagle on her left anterior tibial surface.. She has been trying to treat the wound at home  with Vaseline. There is thick slough buildup on the wound surface and the periwound skin looks a bit macerated. Elaine overt sign of infection. 01/29/2023: She came in for an unscheduled visit today because her wound began bleeding. It apparently bled quite profusely and her husband used an over-the- counter topical agent to get it to stop. On inspection today, he managed to achieve good hemostasis and there is Elaine ongoing blood loss. 02/03/2023: Elaine further issues with bleeding. There is minimal slough accumulation. Some hypertrophic granulation tissue present. 02/11/2023: She has had a lot more drainage over the past week and there has been more breakdown of the skin around her wound. The wound itself has expanded and there is quite a bit of slough on the surfaces. 02/18/2023: Her wound looks much better this week. The drainage has decreased and the periwound skin is in much improved condition. The wound is smaller and there is just 1 tiny satellite adjacent to the main wound. There is slough accumulation on the surfaces. The culture that I took last week returned with methicillin sensitive Staph aureus. She is currently taking Keflex for this. 02/25/2023: Her wound is smaller again this week with minimal slough accumulation. The periwound looks significantly improved. She has completed her oral Keflex. Electronic Signature(s) Signed: 02/25/2023 2:13:55 PM By: Elaine Guess MD FACS Entered By: [name] on 02/25/2023 14:13:55 -------------------------------------------------------------------------------- Physical Exam Details Patient Name: Date of Service: [name] RMA Long. 02/25/2023 2:00 PM Medical Record Number: [id number] Patient Account Number: [id number] Date of Birth/Sex: Treating RN: 01/18/1946 (77 y.o. F) Primary Care Provider: Dina Long Other Clinician: Referring Provider: Treating Provider/Extender: [name] in Treatment: [address] Janat Long ([id number])  [id number] Page 3 of 10 Constitutional .Tachycardic, asymptomatic. . . Elaine acute distress. Respiratory Normal work of breathing on room air. Notes 02/25/2023: Her wound is smaller again this week with minimal slough accumulation. The periwound looks significantly improved. Electronic Signature(s) Signed: 02/25/2023 2:14:29 PM By: Elaine Guess MD FACS Entered By: [name] on 02/25/2023 14:14:29 -------------------------------------------------------------------------------- Physician Orders  Details Patient Name: Date of Service: [name], Elaine RMA Long. 02/25/2023 2:00 PM Medical Record Number: [id number] Patient Account Number: [id number] Date of Birth/Sex: Treating RN: 09/04/1946 (77 y.o. [name] Primary Care Provider: Dina Long Other Clinician: Referring Provider: Treating Provider/Extender: [name] in Treatment: 4 Verbal / Phone Orders: Elaine Diagnosis Coding ICD-10 Coding Code Description [phone] Non-pressure chronic ulcer of other part of left lower leg with fat layer exposed I27.20 Pulmonary hypertension, unspecified I51.9 Heart disease, unspecified Follow-up Appointments ppointment in 1 week. - Dr. Lady Long Room 2 Return A Anesthetic (In clinic) Topical Lidocaine 4% applied to wound bed Bathing/ Shower/ Hygiene May shower with protection but do not get wound dressing(s) wet. Protect dressing(s) with water repellant cover (for example, large plastic bag) or a cast cover and may then take shower. Edema Control - Lymphedema / SCD / Other Elevate legs to the level of the heart or above for 30 minutes daily and/or when sitting for 3-4 times a day throughout the day. A void standing for long periods of time. Exercise regularly If compression wraps slide down please call wound center and speak with a nurse. Wound Treatment Wound #4 - Lower Leg Wound Laterality: Left, Anterior Cleanser: Soap and Water 1 x Per  Week/30 Days Discharge Instructions: May shower and wash wound with dial antibacterial soap and water prior to dressing change. Cleanser: Vashe 5.8 (oz) 1 x Per Week/30 Days Discharge Instructions: Cleanse the wound with Vashe prior to applying a clean dressing using gauze sponges, not tissue or cotton balls. Peri-Wound Care: Triamcinolone 15 (g) 1 x Per Week/30 Days Discharge Instructions: mix with the zinc to periwound. Peri-Wound Care: Sween Lotion (Moisturizing lotion) 1 x Per Week/30 Days Discharge Instructions: Apply moisturizing lotion as directed Topical: Gentamicin 1 x Per Week/30 Days Discharge Instructions: As directed by physician Topical: Mupirocin Ointment 1 x Per Week/30 Days Discharge Instructions: Apply Mupirocin (Bactroban) as instructed Prim Dressing: Cutimed Sorbact Swab 1 x Per Week/30 Days ary Discharge Instructions: Apply to wound bed as instructed TYLASIA, FLETCHALL ([id number]) [id number] Page 4 of 10 Compression Wrap: Kerlix Roll 4.5x3.1 (in/yd) 1 x Per Week/30 Days Discharge Instructions: Apply Kerlix and Coban compression as directed. Compression Wrap: Coban Self-Adherent Wrap 4x5 (in/yd) 1 x Per Week/30 Days Discharge Instructions: Apply over Kerlix as directed. Patient Medications llergies: Sulfa (Sulfonamide Antibiotics), adhesive, amoxicillin, ciprofloxacin, Statins-Hmg-Coa Reductase Inhibitors A Notifications Medication Indication Start End 02/25/2023 lidocaine DOSE topical 4 % cream - cream topical Electronic Signature(s) Signed: 02/25/2023 2:16:14 PM By: Elaine Guess MD FACS Entered By: [name] on 02/25/2023 14:14:46 -------------------------------------------------------------------------------- Problem List Details Patient Name: Date of Service: [name] RMA Long. 02/25/2023 2:00 PM Medical Record Number: [id number] Patient Account Number: [id number] Date of Birth/Sex: Treating RN: [birth year] (77 y.o. F) Primary Care  Provider: Dina Long Other Clinician: Referring Provider: Treating Provider/Extender: [name] in Treatment: 4 Active Problems ICD-10 Encounter Code Description Active Date MDM Diagnosis [phone] Non-pressure chronic ulcer of other part of left lower leg with fat layer exposed4/10/2022 Elaine Yes I27.20 Pulmonary hypertension, unspecified 01/26/2023 Elaine Yes I51.9 Heart disease, unspecified 01/26/2023 Elaine Yes Inactive Problems Resolved Problems Electronic Signature(s) Signed: 02/25/2023 2:12:28 PM By: Elaine Guess MD FACS Entered By: [name] on 02/25/2023 14:12:27 -------------------------------------------------------------------------------- Progress Note Details Patient Name: Date of Service: [name], Elaine RMA Long. 02/25/2023 2:00 PM Medical Record Number: [id number] Patient Account Number: [id number] Date of Birth/Sex: Treating RN: 1945-11-12 (77 y.o. F) Primary Care Provider: Dina Long Other Clinician:  Referring Provider: Treating Provider/Extender: [name] in Treatment: [address], [name] ([id number]) [id number] Page 5 of 10 Chief Complaint Information obtained from Patient Patient seen for complaints of Non-Healing Wound. History of Present Illness (HPI) ADMISSION 02/19/2022 This is a 77 year old woman with minimal pertinent medical history. She does have COPD and pulmonary hypertension, but is not diabetic and is not a smoker. About 6 months ago, she and her husband got a new beagle puppy. The puppy scratched her leg and the scratches ultimately deteriorated into ulcerations. Apparently she sought care with a dermatologist who recommended applying a one-to-one mixture of peroxide and water to the wounds followed by a thick layer of Vaseline. She continue this for some time but then saw her primary care provider who told her to discontinue the peroxide. She has not been on  any antibiotics for the scratches. Today, there are 3 separate wounds on her anterior tibial surface on the left. They are tender and her leg has localized swelling. There is yellow slough buildup in each of the wound bases. ABI in clinic today was normal at 1.16. 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 02/25/2022: Over the past week, the wounds themselves have not improved tremendously but she has noticed some stinging and the periwound skin is quite inflamed. I took a culture last week that had low levels of methicillin-resistant Staph aureus. Because it was fairly low level, I did not prescribe an oral antibiotic. I had planned to change her to mupirocin today, however. We have been using Santyl under Hydrofera Blue with 3 layer compression. 03/04/2022: The wounds have improved quite a bit over the past week. They are less painful and red. She has been taking the prescribed doxycycline but says that it gives her a stomachache. We have been using mupirocin with Hydrofera Blue and 3 layer compression. 03/11/2022: Continued improvement of the wounds. They have contracted quite a bit and have a good surface of healthy granulation tissue present. There is some dried eschar present around all 3 of the lesions. 03/17/2022: Elaine significant change in the wounds from last week. The surface is clean with just a little bit of eschar and slough accumulation. Elaine odor or drainage. Elaine concern for infection. 03/26/2022: All of her wounds are smaller this week. There is good granulation tissue on the surface of each. Elaine slough accumulation. There is just some dry skin around the wounds but not actually involving the wounds. Elaine concern for infection. 04/04/2022: The 2 proximal wounds have healed. The distal wound is much smaller and just has a little bit of dry eschar around the edges. 04/11/2022: Her wound is nearly healed. It is clean without concern for infection. 04/18/2022: Her wound has  healed. READMISSION 01/26/2023 About 8 weeks ago, she sustained another scratch from her Beagle on her left anterior tibial surface.. She has been trying to treat the wound at home with Vaseline. There is thick slough buildup on the wound surface and the periwound skin looks a bit macerated. Elaine overt sign of infection. 01/29/2023: She came in for an unscheduled visit today because her wound began bleeding. It apparently bled quite profusely and her husband used an over-the- counter topical agent to get it to stop. On inspection today, he managed to achieve good hemostasis and there is Elaine ongoing blood loss. 02/03/2023: Elaine further issues with bleeding. There is minimal slough accumulation. Some hypertrophic granulation tissue present. 02/11/2023: She has had a lot more drainage over  the past week and there has been more breakdown of the skin around her wound. The wound itself has expanded and there is quite a bit of slough on the surfaces. 02/18/2023: Her wound looks much better this week. The drainage has decreased and the periwound skin is in much improved condition. The wound is smaller and there is just 1 tiny satellite adjacent to the main wound. There is slough accumulation on the surfaces. The culture that I took last week returned with methicillin sensitive Staph aureus. She is currently taking Keflex for this. 02/25/2023: Her wound is smaller again this week with minimal slough accumulation. The periwound looks significantly improved. She has completed her oral Keflex. Patient History Information obtained from Patient, Caregiver. Family History Unknown History. Social History Former smoker, Marital Status - Married, Alcohol Use - Never, Drug Use - Elaine History, Caffeine Use - Rarely. Medical History Eyes Patient has history of Cataracts - bil removed Denies history of Glaucoma, Optic Neuritis Ear/Nose/Mouth/Throat Denies history of Chronic sinus problems/congestion, Middle ear  problems Hematologic/Lymphatic Patient has history of Anemia Respiratory Patient has history of Chronic Obstructive Pulmonary Disease (COPD) Cardiovascular Patient has history of Coronary Artery Disease, Peripheral Venous Disease - varicose veins Endocrine Denies history of Type I Diabetes, Type II Diabetes Genitourinary Denies history of End Stage Renal Disease Immunological Patient has history of Raynaudoos Denies history of Lupus Erythematosus, Scleroderma Integumentary (Skin) Denies history of History of Burn Musculoskeletal Patient has history of Rheumatoid Arthritis, Osteoarthritis Goyer, Andreana Long ([id number]) [id number] Page 6 of 10 Denies history of Gout Oncologic Denies history of Received Chemotherapy, Received Radiation Psychiatric Denies history of Anorexia/bulimia, Confinement Anxiety Hospitalization/Surgery History - bil cataract removal. - left shoiulder replacement. - right rotator cuff repair. - multiple lumbar spine surgeries. - melanoma removed left arm and chest. - partial excision bone left 2nd toe. Medical A Surgical History Notes nd Hematologic/Lymphatic varicose vein of leg, mixed hyperlipidemia Respiratory dyspnea, chronic bronchitis, pulmonary hypertension, pulmonary emphysema Cardiovascular aortic calcification, tachycardia, left ventricular dysfunction, bradycardia Gastrointestinal Gerd Endocrine prediabetes, hypothyroidism Genitourinary bladder disorder Integumentary (Skin) contact dermatitis Musculoskeletal acquired plantar porokeratosis, neurogenic claudication d/t lumbar spinal stenosis, supraspinatus tendinitis, restless leg syndrome, spinal stenosis of lumbar region Neurologic TIA, insomnia Objective Constitutional Tachycardic, asymptomatic. Elaine acute distress. Vitals Time Taken: 1:46 PM, Height: 63 in, Weight: 115 lbs, BMI: 20.4, Temperature: 98.7 F, Pulse: 115 bpm, Respiratory Rate: 16 breaths/min,  Blood Pressure: 118/60 mmHg. Respiratory Normal work of breathing on room air. General Notes: 02/25/2023: Her wound is smaller again this week with minimal slough accumulation. The periwound looks significantly improved. Integumentary (Hair, Skin) Wound #4 status is Open. Original cause of wound was Trauma. The date acquired was: 11/27/2022. The wound has been in treatment 4 weeks. The wound is located on the Left,Anterior Lower Leg. The wound measures 2.7cm length x 1.5cm width x 0.1cm depth; 3.181cm^2 area and 0.318cm^3 volume. There is Fat Layer (Subcutaneous Tissue) exposed. There is Elaine tunneling or undermining noted. There is a medium amount of serosanguineous drainage noted. The wound margin is distinct with the outline attached to the wound base. There is medium (34-66%) red, pink granulation within the wound bed. There is a medium (34-66%) amount of necrotic tissue within the wound bed including Adherent Slough. The periwound skin appearance had Elaine abnormalities noted for moisture. The periwound skin appearance exhibited: Excoriation, Hemosiderin Staining, Erythema. The periwound skin appearance did not exhibit: Callus, Crepitus, Induration, Rash, Scarring, Atrophie Blanche, Cyanosis, Ecchymosis, Mottled, Pallor, Rubor. The surrounding wound skin color  is noted with erythema which is circumferential. Erythema is measured at 2 cm. Periwound temperature was noted as Elaine Abnormality. Assessment Active Problems ICD-10 Non-pressure chronic ulcer of other part of left lower leg with fat layer exposed Pulmonary hypertension, unspecified Heart disease, unspecified Procedures Wound #4 Pre-procedure diagnosis of Wound #4 is a Venous Leg Ulcer located on the Left,Anterior Lower Leg .Severity of Tissue Pre Debridement is: Fat layer exposed. There was a Selective/Open Wound Non-Viable Tissue Debridement with a total area of 3.18 sq cm performed by Elaine Guess, MD. With the following instrument(s):  Curette to remove Non-Viable tissue/material. Material removed includes [facility] after achieving pain control using Lidocaine 4% Topical Solution. [name], [name] ([id number]) [id number] Page 7 of 10 Elaine specimens were taken. A time out was conducted at 14:00, prior to the start of the procedure. A Minimum amount of bleeding was controlled with Pressure. The procedure was tolerated well. Post Debridement Measurements: 2.7cm length x 1.5cm width x 0.1cm depth; 0.318cm^3 volume. Character of Wound/Ulcer Post Debridement is improved. Severity of Tissue Post Debridement is: Fat layer exposed. Post procedure Diagnosis Wound #4: Same as Pre-Procedure General Notes: scribed for Dr. Lady Long by Samuella Bruin, RN. Plan Follow-up Appointments: Return Appointment in 1 week. - Dr. Lady Long Room 2 Anesthetic: (In clinic) Topical Lidocaine 4% applied to wound bed Bathing/ Shower/ Hygiene: May shower with protection but do not get wound dressing(s) wet. Protect dressing(s) with water repellant cover (for example, large plastic bag) or a cast cover and may then take shower. Edema Control - Lymphedema / SCD / Other: Elevate legs to the level of the heart or above for 30 minutes daily and/or when sitting for 3-4 times a day throughout the day. Avoid standing for long periods of time. Exercise regularly If compression wraps slide down please call wound center and speak with a nurse. The following medication(s) was prescribed: lidocaine topical 4 % cream cream topical was prescribed at facility WOUND #4: - Lower Leg Wound Laterality: Left, Anterior Cleanser: Soap and Water 1 x Per Week/30 Days Discharge Instructions: May shower and wash wound with dial antibacterial soap and water prior to dressing change. Cleanser: Vashe 5.8 (oz) 1 x Per Week/30 Days Discharge Instructions: Cleanse the wound with Vashe prior to applying a clean dressing using gauze sponges, not tissue or cotton  balls. Peri-Wound Care: Triamcinolone 15 (g) 1 x Per Week/30 Days Discharge Instructions: mix with the zinc to periwound. Peri-Wound Care: Sween Lotion (Moisturizing lotion) 1 x Per Week/30 Days Discharge Instructions: Apply moisturizing lotion as directed Topical: Gentamicin 1 x Per Week/30 Days Discharge Instructions: As directed by physician Topical: Mupirocin Ointment 1 x Per Week/30 Days Discharge Instructions: Apply Mupirocin (Bactroban) as instructed Prim Dressing: Cutimed Sorbact Swab 1 x Per Week/30 Days ary Discharge Instructions: Apply to wound bed as instructed Com pression Wrap: Kerlix Roll 4.5x3.1 (in/yd) 1 x Per Week/30 Days Discharge Instructions: Apply Kerlix and Coban compression as directed. Com pression Wrap: Coban Self-Adherent Wrap 4x5 (in/yd) 1 x Per Week/30 Days Discharge Instructions: Apply over Kerlix as directed. 02/25/2023: Her wound is smaller again this week with minimal slough accumulation. The periwound looks significantly improved. I used a curette to debride the slough from her wound. We will continue the mixture of topical gentamicin and mupirocin along with Sorbact contact layer, Kerlix and Coban wrap. Follow-up in 1 week. Electronic Signature(s) Signed: 02/25/2023 2:15:20 PM By: Elaine Guess MD FACS Entered By: [name] on 02/25/2023 14:15:20 -------------------------------------------------------------------------------- HxROS Details Patient Name: Date of Service:  [name], Elaine RMA Long. 02/25/2023 2:00 PM Medical Record Number: [id number] Patient Account Number: [id number] Date of Birth/Sex: Treating RN: 06-Jul-1946 (77 y.o. F) Primary Care Provider: Dina Long Other Clinician: Referring Provider: Treating Provider/Extender: [name] in Treatment: 4 Information Obtained From Patient Caregiver Eyes Medical History: Positive for: Cataracts - bil removed Negative for: Glaucoma; Optic  Neuritis Ear/Nose/Mouth/Throat [name], [name] ([id number]) [id number] Page 8 of 10 Medical History: Negative for: Chronic sinus problems/congestion; Middle ear problems Hematologic/Lymphatic Medical History: Positive for: Anemia Past Medical History Notes: varicose vein of leg, mixed hyperlipidemia Respiratory Medical History: Positive for: Chronic Obstructive Pulmonary Disease (COPD) Past Medical History Notes: dyspnea, chronic bronchitis, pulmonary hypertension, pulmonary emphysema Cardiovascular Medical History: Positive for: Coronary Artery Disease; Peripheral Venous Disease - varicose veins Past Medical History Notes: aortic calcification, tachycardia, left ventricular dysfunction, bradycardia Gastrointestinal Medical History: Past Medical History Notes: Gerd Endocrine Medical History: Negative for: Type I Diabetes; Type II Diabetes Past Medical History Notes: prediabetes, hypothyroidism Genitourinary Medical History: Negative for: End Stage Renal Disease Past Medical History Notes: bladder disorder Immunological Medical History: Positive for: Raynauds Negative for: Lupus Erythematosus; Scleroderma Integumentary (Skin) Medical History: Negative for: History of Burn Past Medical History Notes: contact dermatitis Musculoskeletal Medical History: Positive for: Rheumatoid Arthritis; Osteoarthritis Negative for: Gout Past Medical History Notes: acquired plantar porokeratosis, neurogenic claudication d/t lumbar spinal stenosis, supraspinatus tendinitis, restless leg syndrome, spinal stenosis of lumbar region Neurologic Medical History: Past Medical History Notes: TIA, insomnia Oncologic Medical History: Negative for: Received Chemotherapy; Received Radiation Psychiatric Medical History: Negative for: [name] Anxiety [name], [name] ([id number]) [id number] Page 9 of 10 HBO Extended  History Items Eyes: Cataracts Immunizations Pneumococcal Vaccine: Received Pneumococcal Vaccination: Yes Received Pneumococcal Vaccination On or After 60th Birthday: Yes Implantable Devices None Hospitalization / Surgery History Type of Hospitalization/Surgery bil cataract removal left shoiulder replacement right rotator cuff repair multiple lumbar spine surgeries melanoma removed left arm and chest partial excision bone left 2nd toe Family and Social History Unknown History: Yes; Former smoker; Marital Status - Married; Alcohol Use: Never; Drug Use: Elaine History; Caffeine Use: Rarely; Financial Concerns: Elaine; Food, Clothing or Shelter Needs: Elaine; Support System Lacking: Elaine; Transportation Concerns: Elaine Electronic Signature(s) Signed: 02/25/2023 2:16:14 PM By: Elaine Guess MD FACS Entered By: [name] on 02/25/2023 14:14:03 -------------------------------------------------------------------------------- SuperBill Details Patient Name: Date of Service: [name] RMA Long. 02/25/2023 Medical Record Number: [id number] Patient Account Number: [id number] Date of Birth/Sex: Treating RN: 10-10-1946 (77 y.o. F) Primary Care Provider: Dina Long Other Clinician: Referring Provider: Treating Provider/Extender: [name] in Treatment: 4 Diagnosis Coding ICD-10 Codes Code Description [phone] Non-pressure chronic ulcer of other part of left lower leg with fat layer exposed I27.20 Pulmonary hypertension, unspecified I51.9 Heart disease, unspecified Facility Procedures : CPT4 Code: [id number] Description: 97597 - DEBRIDE WOUND 1ST 20 SQ CM OR < ICD-10 Diagnosis Description L97.822 Non-pressure chronic ulcer of other part of left lower leg with fat layer expose Modifier: d Quantity: 1 Physician Procedures : CPT4 Code Description Modifier [id number] 99214 - WC PHYS LEVEL 4 - EST PT 25 ICD-10 Diagnosis Description L97.822 Non-pressure chronic ulcer of other part of  left lower leg with fat layer exposed I27.20 Pulmonary hypertension, unspecified I51.9 Heart  disease, unspecified Quantity: 1 : [id number] 97597 - WC PHYS DEBR WO ANESTH 20 SQ CM ICD-10 Diagnosis Description L97.822 Non-pressure chronic ulcer of other part of left lower leg with fat layer exposed Vigil, Jatara Long ([id number]) [id number] Quantity: 1 df Page  10 of 10 Electronic Signature(s) Signed: 02/25/2023 2:15:36 PM By: Elaine Guess MD FACS Entered By: [name] on 02/25/2023 14:15:35

## 2023-02-26 NOTE — Progress Notes (Signed)
MOESHA, SARCHET ([id number]) [id number] Page 1 of 8 Visit Report for 02/25/2023 Arrival Information Details Patient Name: Date of Service: [name], [name] RMA Long. 02/25/2023 2:00 PM Medical Record Number: [id number] Patient Account Number: [id number] Date of Birth/Sex: Treating RN: 21-Jun-1946 (77 y.o. [name] Primary Care Alfard Cochrane: Dina Rich Other Clinician: Referring Cleo Santucci: Treating Wilfrid Hyser/Extender: Pincus Badder in Treatment: 4 Visit Information History Since Last Visit Added or deleted any medications: No Patient Arrived: [name] Any new allergies or adverse reactions: No Arrival Time: 13:44 Had a fall or experienced change in No Accompanied By: husband activities of daily living that may affect Transfer Assistance: None risk of falls: Patient Identification Verified: Yes Signs or symptoms of abuse/neglect since last visito No Secondary Verification Process Completed: Yes Hospitalized since last visit: No Patient Requires Transmission-Based Precautions: No Implantable device outside of the clinic excluding No Patient Has Alerts: Yes cellular tissue based products placed in the center Patient Alerts: ABI L 1.16 02/19/22 since last visit: Has Dressing in Place as Prescribed: Yes Has Compression in Place as Prescribed: Yes Pain Present Now: No Electronic Signature(s) Signed: 02/25/2023 3:37:17 PM By: Samuella Bruin Entered By: Samuella Bruin on 02/25/2023 13:46:12 -------------------------------------------------------------------------------- Encounter Discharge Information Details Patient Name: Date of Service: Elaine Hurst RMA Long. 02/25/2023 2:00 PM Medical Record Number: [id number] Patient Account Number: [id number] Date of Birth/Sex: Treating RN: 1946-02-05 (77 y.o. [name] Primary Care Deadra Diggins: Dina Rich Other Clinician: Referring Vu Liebman: Treating Tacuma Graffam/Extender: Pincus Badder in Treatment: 4 Encounter Discharge Information Items Post Procedure Vitals Discharge Condition: Stable Temperature (F): 98.7 Ambulatory Status: Cane Pulse (bpm): 115 Discharge Destination: Home Respiratory Rate (breaths/min): 16 Transportation: Private Auto Blood Pressure (mmHg): 118/60 Accompanied By: husband Schedule Follow-up Appointment: Yes Clinical Summary of Care: Patient Declined Electronic Signature(s) Signed: 02/25/2023 3:37:17 PM By: Samuella Bruin Entered By: Samuella Bruin on 02/25/2023 14:05:47 -------------------------------------------------------------------------------- Lower Extremity Assessment Details Patient Name: Date of Service: Elaine Hurst RMA Long. 02/25/2023 2:00 PM Medical Record Number: [id number] Patient Account Number: [id number] Date of Birth/Sex: Treating RN: 1945-11-23 (77 y.o. [name] Primary Care Dawood Spitler: Dina Rich Other Clinician: Referring Otniel Hoe: Treating Zahniya Zellars/Extender: Pincus Badder in Treatment: 4 Edema Assessment B[Left: [name] ([id number])] [name]: [id number] Page 2 of 8] Assessed: [Left: No] [Right: No] Edema: [Left: N] [Right: o] Calf Left: Right: Point of Measurement: 25 cm From Medial Instep 30 cm Ankle Left: Right: Point of Measurement: 10 cm From Medial Instep 18.8 cm Vascular Assessment Pulses: Dorsalis Pedis Palpable: [Left:Yes] Electronic Signature(s) Signed: 02/25/2023 3:37:17 PM By: Samuella Bruin Entered By: Samuella Bruin on 02/25/2023 13:52:21 -------------------------------------------------------------------------------- Multi Wound Chart Details Patient Name: Date of Service: Elaine Hurst RMA Long. 02/25/2023 2:00 PM Medical Record Number: [id number] Patient Account Number: [id number] Date of Birth/Sex: Treating RN: [birth year] (77 y.o. F) Primary Care Emsley Custer: Dina Rich Other Clinician: Referring Treonna Klee: Treating  Yaman Grauberger/Extender: Pincus Badder in Treatment: 4 Vital Signs Height(in): 63 Pulse(bpm): 115 Weight(lbs): 115 Blood Pressure(mmHg): 118/60 Body Mass Index(BMI): 20.4 Temperature(F): 98.7 Respiratory Rate(breaths/min): 16 [4:Photos:] [N/A:N/A] Left, Anterior Lower Leg N/A N/A Wound Location: Trauma N/A N/A Wounding Event: Venous Leg Ulcer N/A N/A Primary Etiology: Cataracts, Anemia, Chronic N/A N/A Comorbid History: Obstructive Pulmonary Disease (COPD), Coronary Artery Disease, Peripheral Venous Disease, Raynauds, Rheumatoid Arthritis, Osteoarthritis 11/27/2022 N/A N/A Date Acquired: 4 N/A N/A Weeks of Treatment: Open N/A N/A Wound Status: No N/A N/A Wound Recurrence: 2.7x1.5x0.1 N/A N/A Measurements L x W x D (cm) 3.181  N/A N/A A (cm) : rea 0.318 N/A N/A Volume (cm) : 63.50% N/A N/A % Reduction in Area: 63.50% N/A N/A % Reduction in Volume: Full Thickness Without Exposed N/A N/A Classification: Support Structures Medium N/A N/A Exudate Amount: Serosanguineous N/A N/A Exudate Type: red, brown N/A N/A Exudate Color: Distinct, outline attached N/A N/A Wound MarginSIANA, PANAMENO ([id number]) [id number] Page 3 of 8 Medium (34-66%) N/A N/A Granulation Amount: Red, Pink N/A N/A Granulation Quality: Medium (34-66%) N/A N/A Necrotic Amount: Fat Layer (Subcutaneous Tissue): Yes N/A N/A Exposed Structures: Fascia: No Tendon: No Muscle: No Joint: No Bone: No Small (1-33%) N/A N/A Epithelialization: Debridement - Selective/Open Wound N/A N/A Debridement: Pre-procedure Verification/Time Out 14:00 N/A N/A Taken: Lidocaine 4% Topical Solution N/A N/A Pain Control: Slough N/A N/A Tissue Debrided: Non-Viable Tissue N/A N/A Level: 3.18 N/A N/A Debridement A (sq cm): rea Curette N/A N/A Instrument: Minimum N/A N/A Bleeding: Pressure N/A N/A Hemostasis A chieved: Procedure was tolerated well N/A  N/A Debridement Treatment Response: 2.7x1.5x0.1 N/A N/A Post Debridement Measurements L x W x D (cm) 0.318 N/A N/A Post Debridement Volume: (cm) Excoriation: Yes N/A N/A Periwound Skin Texture: Induration: No Callus: No Crepitus: No Rash: No Scarring: No Maceration: Yes N/A N/A Periwound Skin Moisture: Dry/Scaly: No Erythema: Yes N/A N/A Periwound Skin Color: Hemosiderin Staining: Yes Atrophie Blanche: No Cyanosis: No Ecchymosis: No Mottled: No Pallor: No Rubor: No Circumferential N/A N/A Erythema Location: Measured: 2cm N/A N/A Erythema Measurement: No Abnormality N/A N/A Temperature: Debridement N/A N/A Procedures Performed: Treatment Notes Wound #4 (Lower Leg) Wound Laterality: Left, Anterior Cleanser Soap and Water Discharge Instruction: May shower and wash wound with dial antibacterial soap and water prior to dressing change. Vashe 5.8 (oz) Discharge Instruction: Cleanse the wound with Vashe prior to applying a clean dressing using gauze sponges, not tissue or cotton balls. Peri-Wound Care Triamcinolone 15 (g) Discharge Instruction: mix with the zinc to periwound. Sween Lotion (Moisturizing lotion) Discharge Instruction: Apply moisturizing lotion as directed Topical Gentamicin Discharge Instruction: As directed by physician Mupirocin Ointment Discharge Instruction: Apply Mupirocin (Bactroban) as instructed Primary Dressing Cutimed Sorbact Swab Discharge Instruction: Apply to wound bed as instructed Secondary Dressing Secured With Compression Wrap Kerlix Roll 4.5x3.1 (in/yd) Discharge Instruction: Apply Kerlix and Coban compression as directed. Coban Self-Adherent Wrap 4x5 (in/yd) Discharge Instruction: Apply over Kerlix as directed. [name], [name] ([id number]) [id number] Page 4 of 8 Compression Stockings Add-Ons Electronic Signature(s) Signed: 02/25/2023 2:12:33 PM By: Duanne Guess MD FACS Entered By: Duanne Guess  on 02/25/2023 14:12:33 -------------------------------------------------------------------------------- Multi-Disciplinary Care Plan Details Patient Name: Date of Service: Elaine Hurst RMA Long. 02/25/2023 2:00 PM Medical Record Number: [id number] Patient Account Number: [id number] Date of Birth/Sex: Treating RN: August 06, 1946 (77 y.o. [name] Primary Care Ousman Dise: Dina Rich Other Clinician: Referring Santita Hunsberger: Treating Katreena Schupp/Extender: Pincus Badder in Treatment: 4 Active Inactive Nutrition Nursing Diagnoses: Potential for alteratiion in Nutrition/Potential for imbalanced nutrition Goals: Patient/caregiver agrees to and verbalizes understanding of need to obtain nutritional consultation Date Initiated: 01/26/2023 Target Resolution Date: 03/06/2023 Goal Status: Active Patient/caregiver will maintain therapeutic glucose control Date Initiated: 01/26/2023 Target Resolution Date: 03/06/2023 Goal Status: Active Interventions: Assess patient nutrition upon admission and as needed per policy Provide education on nutrition Treatment Activities: Giving encouragement to exercise : 01/26/2023 Notes: Pain, Acute or Chronic Nursing Diagnoses: Pain, acute or chronic: actual or potential Potential alteration in comfort, pain Goals: Patient will verbalize adequate pain control and receive pain control interventions during procedures as needed Date Initiated:  01/26/2023 Target Resolution Date: 03/06/2023 Goal Status: Active Patient/caregiver will verbalize comfort level met Date Initiated: 01/26/2023 Target Resolution Date: 03/06/2023 Goal Status: Active Interventions: Complete pain assessment as per visit requirements Encourage patient to take pain medications as prescribed Provide education on pain management Treatment Activities: Administer pain control measures as ordered : 01/26/2023 Notes: Wound/Skin Impairment Nursing Diagnoses: Knowledge deficit related to  ulceration/compromised skin integrity [name], [name] ([id number]) [id number] Page 5 of 8 Goals: Patient/caregiver will verbalize understanding of skin care regimen Date Initiated: 01/26/2023 Target Resolution Date: 03/06/2023 Goal Status: Active Interventions: Assess patient/caregiver ability to perform ulcer/skin care regimen upon admission and as needed Assess ulceration(s) every visit Provide education on ulcer and skin care Treatment Activities: Skin care regimen initiated : 01/26/2023 Topical wound management initiated : 01/26/2023 Notes: Electronic Signature(s) Signed: 02/25/2023 3:37:17 PM By: Samuella Bruin Entered By: Samuella Bruin on 02/25/2023 14:01:09 -------------------------------------------------------------------------------- Pain Assessment Details Patient Name: Date of Service: Elaine Hurst RMA Long. 02/25/2023 2:00 PM Medical Record Number: [id number] Patient Account Number: [id number] Date of Birth/Sex: Treating RN: [birth year] (77 y.o. [name] Primary Care Rosalio Catterton: Dina Rich Other Clinician: Referring Bassheva Flury: Treating Jamiracle Avants/Extender: Pincus Badder in Treatment: 4 Active Problems Location of Pain Severity and Description of Pain Patient Has Paino No Site Locations Rate the pain. Current Pain Level: 0 Pain Management and Medication Current Pain Management: Electronic Signature(s) Signed: 02/25/2023 3:37:17 PM By: Samuella Bruin Entered By: Samuella Bruin on 02/25/2023 13:46:48 -------------------------------------------------------------------------------- Patient/Caregiver Education Details Patient Name: Date of Service: Elaine Hurst RMA Long. 5/1/2024andnbsp2:00 PM Medical Record Number: [id number] Patient Account Number: [id number] Date of Birth/Gender: Treating RN: 1946-08-02 (77 y.o. [name] Primary Care Physician: Dina Rich Other Clinician: Referring  Physician: Treating Physician/Extender: Ethelene Hal Wildorado, [name] ([id number]) [id number] Page 6 of 8 Weeks in Treatment: 4 Education Assessment Education Provided To: Patient Education Topics Provided Safety: Methods: Explain/Verbal Responses: Reinforcements needed, State content correctly Electronic Signature(s) Signed: 02/25/2023 3:37:17 PM By: Samuella Bruin Entered By: Samuella Bruin on 02/25/2023 14:01:31 -------------------------------------------------------------------------------- Wound Assessment Details Patient Name: Date of Service: Elaine Hurst RMA Long. 02/25/2023 2:00 PM Medical Record Number: [id number] Patient Account Number: [id number] Date of Birth/Sex: Treating RN: 03-Jan-1946 (77 y.o. [name] Primary Care Sayda Grable: Dina Rich Other Clinician: Referring Suraya Vidrine: Treating Michele Kerlin/Extender: Pincus Badder in Treatment: 4 Wound Status Wound Number: 4 Primary Venous Leg Ulcer Etiology: Wound Location: Left, Anterior Lower Leg Wound Open Wounding Event: Trauma Status: Date Acquired: 11/27/2022 Comorbid Cataracts, Anemia, Chronic Obstructive Pulmonary Disease Weeks Of Treatment: 4 History: (COPD), Coronary Artery Disease, Peripheral Venous Disease, Clustered Wound: No Raynauds, Rheumatoid Arthritis, Osteoarthritis Photos Wound Measurements Length: (cm) 2.7 Width: (cm) 1.5 Depth: (cm) 0.1 Area: (cm) 3.181 Volume: (cm) 0.318 % Reduction in Area: 63.5% % Reduction in Volume: 63.5% Epithelialization: Small (1-33%) Tunneling: No Undermining: No Wound Description Classification: Full Thickness Without Exposed Suppor Wound Margin: Distinct, outline attached Exudate Amount: Medium Exudate Type: Serosanguineous Exudate Color: red, brown t Structures Foul Odor After Cleansing: No Slough/Fibrino Yes Wound Bed Granulation Amount: Medium (34-66%) Exposed Structure Granulation  Quality: Red, Pink Fascia Exposed: No Necrotic Amount: Medium (34-66%) Fat Layer (Subcutaneous Tissue) Exposed: Yes Necrotic Quality: Adherent Slough Tendon Exposed: No Muscle Exposed: No [name], [name] ([id number]) [id number] Page 7 of 8 Joint Exposed: No Bone Exposed: No Periwound Skin Texture Texture Color No Abnormalities Noted: No No Abnormalities Noted: No Callus: No Atrophie Blanche: No Crepitus: No Cyanosis: No Excoriation: Yes Ecchymosis: No Induration: No  Erythema: Yes Rash: No Erythema Location: Circumferential Scarring: No Erythema Measurement: Measured 2 cm Moisture Hemosiderin Staining: Yes No Abnormalities Noted: Yes Mottled: No Pallor: No Rubor: No Temperature / Pain Temperature: No Abnormality Treatment Notes Wound #4 (Lower Leg) Wound Laterality: Left, Anterior Cleanser Soap and Water Discharge Instruction: May shower and wash wound with dial antibacterial soap and water prior to dressing change. Vashe 5.8 (oz) Discharge Instruction: Cleanse the wound with Vashe prior to applying a clean dressing using gauze sponges, not tissue or cotton balls. Peri-Wound Care Triamcinolone 15 (g) Discharge Instruction: mix with the zinc to periwound. Sween Lotion (Moisturizing lotion) Discharge Instruction: Apply moisturizing lotion as directed Topical Gentamicin Discharge Instruction: As directed by physician Mupirocin Ointment Discharge Instruction: Apply Mupirocin (Bactroban) as instructed Primary Dressing Cutimed Sorbact Swab Discharge Instruction: Apply to wound bed as instructed Secondary Dressing Secured With Compression Wrap Kerlix Roll 4.5x3.1 (in/yd) Discharge Instruction: Apply Kerlix and Coban compression as directed. Coban Self-Adherent Wrap 4x5 (in/yd) Discharge Instruction: Apply over Kerlix as directed. Compression Stockings Add-Ons Electronic Signature(s) Signed: 02/25/2023 3:37:17 PM By: Samuella Bruin Entered By: Samuella Bruin on 02/25/2023 13:55:07 -------------------------------------------------------------------------------- Vitals Details Patient Name: Date of Service: Elaine Hurst RMA Long. 02/25/2023 2:00 PM Medical Record Number: [id number] Patient Account Number: [id number] Date of Birth/Sex: Treating RN: 01-31-1946 (77 y.o. [name] Primary Care Marlaya Turck: Dina Rich Other Clinician: SHERMAINE, [name] ([id number]) [id number] Page 8 of 8 Referring Danijela Vessey: Treating Rendon Howell/Extender: Pincus Badder in Treatment: 4 Vital Signs Time Taken: 13:46 Temperature (F): 98.7 Height (in): 63 Pulse (bpm): 115 Weight (lbs): 115 Respiratory Rate (breaths/min): 16 Body Mass Index (BMI): 20.4 Blood Pressure (mmHg): 118/60 Reference Range: 80 - 120 mg / dl Electronic Signature(s) Signed: 02/25/2023 3:37:17 PM By: Samuella Bruin Entered By: Samuella Bruin on 02/25/2023 13:46:42

## 2023-03-04 ENCOUNTER — Encounter (HOSPITAL_BASED_OUTPATIENT_CLINIC_OR_DEPARTMENT_OTHER): Payer: PPO | Admitting: General Surgery

## 2023-03-04 DIAGNOSIS — L97822 Non-pressure chronic ulcer of other part of left lower leg with fat layer exposed: Secondary | ICD-10-CM | POA: Diagnosis not present

## 2023-03-04 NOTE — Progress Notes (Signed)
MARIETTA, BUSHNELL ([id number]) [id number] Page 1 of 7 Visit Report for 03/04/2023 Arrival Information Details Patient Name: Date of Service: [name], [name] [name] 03/04/2023 2:45 PM Medical Record Number: [id number] Patient Account Number: [id number] Date of Birth/Sex: Treating RN: [birth year] (77 y.o. [name] Primary Care Naia Ruff: Dina Rich Other Clinician: Referring Shaunessy Dobratz: Treating [name]/Extender: Pincus Badder in Treatment: 5 Visit Information History Since Last Visit Added or deleted any medications: No Patient Arrived: [name] Any new allergies or adverse reactions: No Arrival Time: 14:28 Had a fall or experienced change in No Accompanied By: husband activities of daily living that may affect Transfer Assistance: None risk of falls: Patient Identification Verified: Yes Signs or symptoms of abuse/neglect since last visito No Secondary Verification Process Completed: Yes Hospitalized since last visit: No Patient Requires Transmission-Based Precautions: No Implantable device outside of the clinic excluding No Patient Has Alerts: Yes cellular tissue based products placed in the center Patient Alerts: ABI L 1.16 02/19/22 since last visit: Has Dressing in Place as Prescribed: Yes Has Compression in Place as Prescribed: Yes Pain Present Now: No Electronic Signature(s) Signed: 03/04/2023 3:33:12 PM By: Samuella Bruin Entered By: Samuella Bruin on 03/04/2023 14:30:07 -------------------------------------------------------------------------------- Encounter Discharge Information Details Patient Name: Date of Service: [name] [name] 03/04/2023 2:45 PM Medical Record Number: [id number] Patient Account Number: [id number] Date of Birth/Sex: Treating RN: [birth year] (77 y.o. [name] Primary Care Oda Placke: Dina Rich Other Clinician: Referring Latoya Maulding: Treating Johann Santone/Extender: Pincus Badder in Treatment: 5 Encounter Discharge Information Items Post Procedure Vitals Discharge Condition: Stable Temperature (F): 97.5 Ambulatory Status: Cane Pulse (bpm): 106 Discharge Destination: Home Respiratory Rate (breaths/min): 16 Transportation: Private Auto Blood Pressure (mmHg): 133/73 Accompanied By: husband Schedule Follow-up Appointment: Yes Clinical Summary of Care: Patient Declined Electronic Signature(s) Signed: 03/04/2023 3:33:12 PM By: Samuella Bruin Entered By: Samuella Bruin on 03/04/2023 15:02:25 -------------------------------------------------------------------------------- Lower Extremity Assessment Details Patient Name: Date of Service: [name] [name] 03/04/2023 2:45 PM Medical Record Number: [id number] Patient Account Number: [id number] Date of Birth/Sex: Treating RN: 22-Mar-1946 (77 y.o. [name] Primary Care Juliahna Wiswell: Dina Rich Other Clinician: Referring Mikenna Bunkley: Treating Castin Donaghue/Extender: Pincus Badder in Treatment: 5 Edema Assessment B[Left: [name] ([id number])] [name]: [id number].pdf Page 2 of 7] Assessed: [Left: No] [Right: No] Edema: [Left: N] [Right: o] Calf Left: Right: Point of Measurement: 25 cm From Medial Instep 29.5 cm Ankle Left: Right: Point of Measurement: 10 cm From Medial Instep 17.5 cm Vascular Assessment Pulses: Dorsalis Pedis Palpable: [Left:Yes] Electronic Signature(s) Signed: 03/04/2023 3:33:12 PM By: Samuella Bruin Entered By: Samuella Bruin on 03/04/2023 14:35:16 -------------------------------------------------------------------------------- Multi Wound Chart Details Patient Name: Date of Service: [name] [name] 03/04/2023 2:45 PM Medical Record Number: [id number] Patient Account Number: [id number] Date of Birth/Sex: Treating RN: [birth year] (76 y.o. F) Primary Care [name]: Dina Rich Other Clinician: Referring  Amalie Koran: Treating Reymundo Winship/Extender: Pincus Badder in Treatment: 5 Vital Signs Height(in): 63 Pulse(bpm): 106 Weight(lbs): 115 Blood Pressure(mmHg): 133/73 Body Mass Index(BMI): 20.4 Temperature(F): 97.5 Respiratory Rate(breaths/min): 16 [4:Photos:] [N/A:N/A] Left, Anterior Lower Leg N/A N/A Wound Location: Trauma N/A N/A Wounding Event: Venous Leg Ulcer N/A N/A Primary Etiology: Cataracts, Anemia, Chronic N/A N/A Comorbid History: Obstructive Pulmonary Disease (COPD), Coronary Artery Disease, Peripheral Venous Disease, Raynauds, Rheumatoid Arthritis, Osteoarthritis 11/27/2022 N/A N/A Date Acquired: 5 N/A N/A Weeks of Treatment: Open N/A N/A Wound Status: No N/A N/A Wound Recurrence: 2.8x1x0.1 N/A N/A Measurements L x W x D (cm) 2.199  N/A N/A A (cm) : rea 0.22 N/A N/A Volume (cm) : 74.80% N/A N/A % Reduction in Area: 74.80% N/A N/A % Reduction in Volume: Full Thickness Without Exposed N/A N/A Classification: Support Structures Medium N/A N/A Exudate Amount: Serosanguineous N/A N/A Exudate Type: red, brown N/A N/A Exudate Color: Distinct, outline attached N/A N/A Wound MarginLUSERO, THIBODEAUX ([id number]) [id number] Page 3 of 7 Medium (34-66%) N/A N/A Granulation Amount: Red, Pink N/A N/A Granulation Quality: Medium (34-66%) N/A N/A Necrotic Amount: Fat Layer (Subcutaneous Tissue): Yes N/A N/A Exposed Structures: Fascia: No Tendon: No Muscle: No Joint: No Bone: No Medium (34-66%) N/A N/A Epithelialization: Debridement - Selective/Open Wound N/A N/A Debridement: Pre-procedure Verification/Time Out 14:40 N/A N/A Taken: Lidocaine 4% Topical Solution N/A N/A Pain Control: Slough N/A N/A Tissue Debrided: Non-Viable Tissue N/A N/A Level: 2.2 N/A N/A Debridement A (sq cm): rea Curette N/A N/A Instrument: Minimum N/A N/A Bleeding: Pressure N/A N/A Hemostasis A chieved: Procedure was  tolerated well N/A N/A Debridement Treatment Response: 2.8x1x0.1 N/A N/A Post Debridement Measurements L x W x D (cm) 0.22 N/A N/A Post Debridement Volume: (cm) Excoriation: No N/A N/A Periwound Skin Texture: Induration: No Callus: No Crepitus: No Rash: No Scarring: No Maceration: Yes N/A N/A Periwound Skin Moisture: Dry/Scaly: No Hemosiderin Staining: Yes N/A N/A Periwound Skin Color: Atrophie Blanche: No Cyanosis: No Ecchymosis: No Erythema: No Mottled: No Pallor: No Rubor: No No Abnormality N/A N/A Temperature: Debridement N/A N/A Procedures Performed: Treatment Notes Electronic Signature(s) Signed: 03/04/2023 3:01:18 PM By: Duanne Guess MD FACS Entered By: Duanne Guess on 03/04/2023 15:01:18 -------------------------------------------------------------------------------- Multi-Disciplinary Care Plan Details Patient Name: Date of Service: [name] [name] 03/04/2023 2:45 PM Medical Record Number: [id number] Patient Account Number: [id number] Date of Birth/Sex: Treating RN: 09/17/1946 (77 y.o. [name] Primary Care Rilyn Upshaw: Dina Rich Other Clinician: Referring Leyda Vanderwerf: Treating Vantasia Pinkney/Extender: Pincus Badder in Treatment: 5 Active Inactive Nutrition Nursing Diagnoses: Potential for alteratiion in Nutrition/Potential for imbalanced nutrition Goals: Patient/caregiver agrees to and verbalizes understanding of need to obtain nutritional consultation Date Initiated: 01/26/2023 Target Resolution Date: 04/24/2023 Goal Status: Active Patient/caregiver will maintain therapeutic glucose control Date Initiated: 01/26/2023 Target Resolution Date: 04/24/2023 Goal Status: Active Interventions: Assess patient nutrition upon admission and as needed per policy ELKY, GUILLORY ([id number]) [id number] Page 4 of 7 Provide education on nutrition Treatment Activities: Giving encouragement to exercise :  01/26/2023 Notes: Pain, Acute or Chronic Nursing Diagnoses: Pain, acute or chronic: actual or potential Potential alteration in comfort, pain Goals: Patient will verbalize adequate pain control and receive pain control interventions during procedures as needed Date Initiated: 01/26/2023 Target Resolution Date: 04/17/2023 Goal Status: Active Patient/caregiver will verbalize comfort level met Date Initiated: 01/26/2023 Target Resolution Date: 04/24/2023 Goal Status: Active Interventions: Complete pain assessment as per visit requirements Encourage patient to take pain medications as prescribed Provide education on pain management Treatment Activities: Administer pain control measures as ordered : 01/26/2023 Notes: Electronic Signature(s) Signed: 03/04/2023 3:33:12 PM By: Samuella Bruin Entered By: Samuella Bruin on 03/04/2023 15:01:40 -------------------------------------------------------------------------------- Pain Assessment Details Patient Name: Date of Service: [name] [name] 03/04/2023 2:45 PM Medical Record Number: [id number] Patient Account Number: [id number] Date of Birth/Sex: Treating RN: May 25, 1946 (77 y.o. [name] Primary Care Skyler Carel: Dina Rich Other Clinician: Referring Blinda Turek: Treating Trevone Prestwood/Extender: Pincus Badder in Treatment: 5 Active Problems Location of Pain Severity and Description of Pain Patient Has Paino No Site Locations Rate the pain. Current Pain Level: 0 Pain Management and  Medication Current Pain Management: Electronic Signature(s) ALLICE, SHIU K ([id number]) [id number] Page 5 of 7 Signed: 03/04/2023 3:33:12 PM By: Gelene Mink By: Samuella Bruin on 03/04/2023 14:31:01 -------------------------------------------------------------------------------- Patient/Caregiver Education Details Patient Name: Date of Service: [name] [name] 5/8/2024andnbsp2:45  PM Medical Record Number: [id number] Patient Account Number: [id number] Date of Birth/Gender: Treating RN: January 30, 1946 (77 y.o. [name] Primary Care Physician: Dina Rich Other Clinician: Referring Physician: Treating Physician/Extender: Pincus Badder in Treatment: 5 Education Assessment Education Provided To: Patient Education Topics Provided Wound/Skin Impairment: Methods: Explain/Verbal Responses: Reinforcements needed, State content correctly Electronic Signature(s) Signed: 03/04/2023 3:33:12 PM By: Samuella Bruin Entered By: Samuella Bruin on 03/04/2023 15:01:51 -------------------------------------------------------------------------------- Wound Assessment Details Patient Name: Date of Service: [name] [name] 03/04/2023 2:45 PM Medical Record Number: [id number] Patient Account Number: [id number] Date of Birth/Sex: Treating RN: Jan 10, 1946 (77 y.o. [name] Primary Care Diavian Furgason: Dina Rich Other Clinician: Referring Kateleen Encarnacion: Treating Jeris Easterly/Extender: Pincus Badder in Treatment: 5 Wound Status Wound Number: 4 Primary Venous Leg Ulcer Etiology: Wound Location: Left, Anterior Lower Leg Wound Open Wounding Event: Trauma Status: Date Acquired: 11/27/2022 Comorbid Cataracts, Anemia, Chronic Obstructive Pulmonary Disease Weeks Of Treatment: 5 History: (COPD), Coronary Artery Disease, Peripheral Venous Disease, Clustered Wound: No Raynauds, Rheumatoid Arthritis, Osteoarthritis Photos Wound Measurements Length: (cm) 2.8 Width: (cm) 1 Depth: (cm) 0.1 Area: (cm) 2.199 Volume: (cm) 0.22 Zervas, Xee K ([id number]) Wound Description Classification: Full Thickness Without Exposed Support Structures Wound Margin: Distinct, outline attached Exudate Amount: Medium Exudate Type: Serosanguineous Exudate Color: red, brown Foul Odor After Cleansing: No Slough/Fibrino Yes % Reduction in Area:  74.8% % Reduction in Volume: 74.8% Epithelialization: Medium (34-66%) Tunneling: No Undermining: No [id number] Page 6 of 7 Wound Bed Granulation Amount: Medium (34-66%) Exposed Structure Granulation Quality: Red, Pink Fascia Exposed: No Necrotic Amount: Medium (34-66%) Fat Layer (Subcutaneous Tissue) Exposed: Yes Necrotic Quality: Adherent Slough Tendon Exposed: No Muscle Exposed: No Joint Exposed: No Bone Exposed: No Periwound Skin Texture Texture Color No Abnormalities Noted: No No Abnormalities Noted: No Callus: No Atrophie Blanche: No Crepitus: No Cyanosis: No Excoriation: No Ecchymosis: No Induration: No Erythema: No Rash: No Hemosiderin Staining: Yes Scarring: No Mottled: No Pallor: No Moisture Rubor: No No Abnormalities Noted: Yes Temperature / Pain Temperature: No Abnormality Treatment Notes Wound #4 (Lower Leg) Wound Laterality: Left, Anterior Cleanser Soap and Water Discharge Instruction: May shower and wash wound with dial antibacterial soap and water prior to dressing change. Vashe 5.8 (oz) Discharge Instruction: Cleanse the wound with Vashe prior to applying a clean dressing using gauze sponges, not tissue or cotton balls. Peri-Wound Care Triamcinolone 15 (g) Discharge Instruction: mix with the zinc to periwound. Sween Lotion (Moisturizing lotion) Discharge Instruction: Apply moisturizing lotion as directed Topical Gentamicin Discharge Instruction: As directed by physician Mupirocin Ointment Discharge Instruction: Apply Mupirocin (Bactroban) as instructed Primary Dressing Cutimed Sorbact Swab Discharge Instruction: Apply to wound bed as instructed Secondary Dressing Secured With Compression Wrap Kerlix Roll 4.5x3.1 (in/yd) Discharge Instruction: Apply Kerlix and Coban compression as directed. Coban Self-Adherent Wrap 4x5 (in/yd) Discharge Instruction: Apply over Kerlix as directed. Compression  Stockings Add-Ons Electronic Signature(s) Signed: 03/04/2023 3:33:12 PM By: Zollie Beckers, [name] ([id number]) [id number] Page 7 of 7 Entered By: Samuella Bruin on 03/04/2023 14:37:21 -------------------------------------------------------------------------------- Vitals Details Patient Name: Date of Service: ANALAYA, MILKO [name] 03/04/2023 2:45 PM Medical Record Number: [id number] Patient Account Number: [id number] Date of Birth/Sex: Treating RN: 02/18/1946 (77 y.o. [name] Primary Care Elajah Kunsman:  Dina Rich Other Clinician: Referring Haidynn Almendarez: Treating Sion Thane/Extender: Pincus Badder in Treatment: 5 Vital Signs Time Taken: 14:30 Temperature (F): 97.5 Height (in): 63 Pulse (bpm): 106 Weight (lbs): 115 Respiratory Rate (breaths/min): 16 Body Mass Index (BMI): 20.4 Blood Pressure (mmHg): 133/73 Reference Range: 80 - 120 mg / dl Electronic Signature(s) Signed: 03/04/2023 3:33:12 PM By: Samuella Bruin Entered By: Samuella Bruin on 03/04/2023 14:30:55

## 2023-03-04 NOTE — Progress Notes (Signed)
[name], [name] ([id number]) [id number] Page 1 of 10 Visit Report for 03/04/2023 Chief Complaint Document Details Patient Name: Date of Service: [name] [name] 03/04/2023 2:45 PM Medical Record Number: [id number] Patient Account Number: [id number] Date of Birth/Sex: Treating RN: [birth year] (77 y.o. F) Primary Care Provider: Dina Long Other Clinician: Referring Provider: Treating Provider/Extender: Pincus Badder in Treatment: 5 Information Obtained from: Patient Chief Complaint Patient seen for complaints of Non-Healing Wound. Electronic Signature(s) Signed: 03/04/2023 3:01:27 PM By: Duanne Guess MD FACS Entered By: Duanne Guess on 03/04/2023 15:01:27 -------------------------------------------------------------------------------- Debridement Details Patient Name: Date of Service: [name] [name] 03/04/2023 2:45 PM Medical Record Number: [id number] Patient Account Number: [id number] Date of Birth/Sex: Treating RN: [birth year] (77 y.o. [name] Primary Care Provider: Dina Long Other Clinician: Referring Provider: Treating Provider/Extender: Pincus Badder in Treatment: 5 Debridement Performed for Assessment: Wound #4 Left,Anterior Lower Leg Performed By: Physician Duanne Guess, MD Debridement Type: Debridement Severity of Tissue Pre Debridement: Fat layer exposed Level of Consciousness (Pre-procedure): Awake and Alert Pre-procedure Verification/Time Out Yes - 14:40 Taken: Start Time: 14:40 Pain Control: Lidocaine 4% T opical Solution Percent of Wound Bed Debrided: 100% T Area Debrided (cm): otal 2.2 Tissue and other material debrided: Non-Viable, Slough, Slough Level: Non-Viable Tissue Debridement Description: Selective/Open Wound Instrument: Curette Bleeding: Minimum Hemostasis Achieved: Pressure Response to Treatment: Procedure was tolerated well Level of Consciousness (Post-  Awake and Alert procedure): Post Debridement Measurements of Total Wound Length: (cm) 2.8 Width: (cm) 1 Depth: (cm) 0.1 Volume: (cm) 0.22 Character of Wound/Ulcer Post Debridement: Improved Severity of Tissue Post Debridement: Fat layer exposed Post Procedure Diagnosis Same as Pre-procedure Notes scribed for Dr. Lady Gary by Elaine Bruin, RN Electronic Signature(s) Signed: 03/04/2023 3:33:12 PM By: [name] Signed: 03/04/2023 4:23:55 PM By: Duanne Guess MD FACS [name], [name] ([id number]) [id number] Page 2 of 10 Entered By: [name] on 03/04/2023 14:43:28 -------------------------------------------------------------------------------- HPI Details Patient Name: Date of Service: Elaine, Long [name] 03/04/2023 2:45 PM Medical Record Number: [id number] Patient Account Number: [id number] Date of Birth/Sex: Treating RN: 1946/01/24 (77 y.o. F) Primary Care Provider: Dina Long Other Clinician: Referring Provider: Treating Provider/Extender: Pincus Badder in Treatment: 5 History of Present Illness HPI Description: ADMISSION 02/19/2022 This is a 77 year old woman with minimal pertinent medical history. She does have COPD and pulmonary hypertension, but is not diabetic and is not a smoker. About 6 months ago, she and her husband got a new beagle puppy. The puppy scratched her leg and the scratches ultimately deteriorated into ulcerations. Apparently she sought care with a dermatologist who recommended applying a one-to-one mixture of peroxide and water to the wounds followed by a thick layer of Vaseline. She continue this for some time but then saw her primary care provider who told her to discontinue the peroxide. She has not been on any antibiotics for the scratches. Today, there are 3 separate wounds on her anterior tibial surface on the left. They are tender and her leg has localized swelling. There is yellow slough  buildup in each of the wound bases. ABI in clinic today was normal at 1.16. 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 02/25/2022: Over the past week, the wounds themselves have not improved tremendously but she has noticed some stinging and the periwound skin is quite inflamed. I took a culture last week that had low levels of methicillin-resistant Staph aureus. Because it was fairly low level, I did not prescribe an  oral antibiotic. I had planned to change her to mupirocin today, however. We have been using Santyl under Hydrofera Blue with 3 layer compression. 03/04/2022: The wounds have improved quite a bit over the past week. They are less painful and red. She has been taking the prescribed doxycycline but says that it gives her a stomachache. We have been using mupirocin with Hydrofera Blue and 3 layer compression. 03/11/2022: Continued improvement of the wounds. They have contracted quite a bit and have a good surface of healthy granulation tissue present. There is some dried eschar present around all 3 of the lesions. 03/17/2022: No significant change in the wounds from last week. The surface is clean with just a little bit of eschar and slough accumulation. No odor or drainage. No concern for infection. 03/26/2022: All of her wounds are smaller this week. There is good granulation tissue on the surface of each. No slough accumulation. There is just some dry skin around the wounds but not actually involving the wounds. No concern for infection. 04/04/2022: The 2 proximal wounds have healed. The distal wound is much smaller and just has a little bit of dry eschar around the edges. 04/11/2022: Her wound is nearly healed. It is clean without concern for infection. 04/18/2022: Her wound has healed. READMISSION 01/26/2023 About 8 weeks ago, she sustained another scratch from her Beagle on her left anterior tibial surface.. She has been trying to treat the wound at home with Vaseline.  There is thick slough buildup on the wound surface and the periwound skin looks a bit macerated. No overt sign of infection. 01/29/2023: She came in for an unscheduled visit today because her wound began bleeding. It apparently bled quite profusely and her husband used an over-the- counter topical agent to get it to stop. On inspection today, he managed to achieve good hemostasis and there is no ongoing blood loss. 02/03/2023: No further issues with bleeding. There is minimal slough accumulation. Some hypertrophic granulation tissue present. 02/11/2023: She has had a lot more drainage over the past week and there has been more breakdown of the skin around her wound. The wound itself has expanded and there is quite a bit of slough on the surfaces. 02/18/2023: Her wound looks much better this week. The drainage has decreased and the periwound skin is in much improved condition. The wound is smaller and there is just 1 tiny satellite adjacent to the main wound. There is slough accumulation on the surfaces. The culture that I took last week returned with methicillin sensitive Staph aureus. She is currently taking Keflex for this. 02/25/2023: Her wound is smaller again this week with minimal slough accumulation. The periwound looks significantly improved. She has completed her oral Keflex. 03/04/2023: The wound continues to contract. The periwound is completely healed and looks like normal intact skin. There is some slough on the surface of the wound overlying good granulation tissue. Electronic Signature(s) Signed: 03/04/2023 3:02:12 PM By: Duanne Guess MD FACS Entered By: Duanne Guess on 03/04/2023 15:02:12 -------------------------------------------------------------------------------- Physical Exam Details Patient Name: Date of Service: [name] [name] 03/04/2023 2:45 PM Medical Record Number: [id number] Patient Account Number: [id number] Date of Birth/Sex: Treating RN: February 12, 1946 (77 y.o. Hildagarde Bradsher,  Marki K ([id number]) [id number] Page 3 of 10 Primary Care Provider: Dina Long Other Clinician: Referring Provider: Treating Provider/Extender: Pincus Badder in Treatment: 5 Constitutional . Slightly tachycardic. . . no acute distress. Respiratory Normal work of breathing on room air. Notes 03/04/2023: The wound continues to contract.  The periwound is completely healed and looks like normal intact skin. There is some slough on the surface of the wound overlying good granulation tissue. Electronic Signature(s) Signed: 03/04/2023 3:16:16 PM By: Duanne Guess MD FACS Previous Signature: 03/04/2023 3:15:49 PM Version By: Duanne Guess MD FACS Entered By: Duanne Guess on 03/04/2023 15:16:16 -------------------------------------------------------------------------------- Physician Orders Details Patient Name: Date of Service: [name] [name] 03/04/2023 2:45 PM Medical Record Number: [id number] Patient Account Number: [id number] Date of Birth/Sex: Treating RN: 11-07-1945 (77 y.o. [name] Primary Care Provider: Dina Long Other Clinician: Referring Provider: Treating Provider/Extender: Pincus Badder in Treatment: 5 Verbal / Phone Orders: No Diagnosis Coding ICD-10 Coding Code Description [phone] Non-pressure chronic ulcer of other part of left lower leg with fat layer exposed I27.20 Pulmonary hypertension, unspecified I51.9 Heart disease, unspecified Follow-up Appointments ppointment in 1 week. - Dr. Lady Gary Room 2 Return A Anesthetic (In clinic) Topical Lidocaine 4% applied to wound bed Bathing/ Shower/ Hygiene May shower with protection but do not get wound dressing(s) wet. Protect dressing(s) with water repellant cover (for example, large plastic bag) or a cast cover and may then take shower. Edema Control - Lymphedema / SCD / Other Elevate legs to the level of the heart or above for  30 minutes daily and/or when sitting for 3-4 times a day throughout the day. A void standing for long periods of time. Exercise regularly If compression wraps slide down please call wound center and speak with a nurse. Wound Treatment Wound #4 - Lower Leg Wound Laterality: Left, Anterior Cleanser: Soap and Water 1 x Per Week/30 Days Discharge Instructions: May shower and wash wound with dial antibacterial soap and water prior to dressing change. Cleanser: Vashe 5.8 (oz) 1 x Per Week/30 Days Discharge Instructions: Cleanse the wound with Vashe prior to applying a clean dressing using gauze sponges, not tissue or cotton balls. Peri-Wound Care: Triamcinolone 15 (g) 1 x Per Week/30 Days Discharge Instructions: mix with the zinc to periwound. Peri-Wound Care: Sween Lotion (Moisturizing lotion) 1 x Per Week/30 Days Discharge Instructions: Apply moisturizing lotion as directed Topical: Gentamicin 1 x Per Week/30 Days Discharge Instructions: As directed by physician Topical: Mupirocin Ointment 1 x Per Week/30 Days Discharge Instructions: Apply Mupirocin (Bactroban) as instructed MELL, [name] ([id number]) [id number] Page 4 of 10 Prim Dressing: Cutimed Sorbact Swab 1 x Per Week/30 Days ary Discharge Instructions: Apply to wound bed as instructed Compression Wrap: Kerlix Roll 4.5x3.1 (in/yd) 1 x Per Week/30 Days Discharge Instructions: Apply Kerlix and Coban compression as directed. Compression Wrap: Coban Self-Adherent Wrap 4x5 (in/yd) 1 x Per Week/30 Days Discharge Instructions: Apply over Kerlix as directed. Patient Medications llergies: Sulfa (Sulfonamide Antibiotics), adhesive, amoxicillin, ciprofloxacin, Statins-Hmg-Coa Reductase Inhibitors A Notifications Medication Indication Start End 03/04/2023 lidocaine DOSE topical 4 % cream - cream topical Electronic Signature(s) Signed: 03/04/2023 4:23:55 PM By: Duanne Guess MD FACS Entered By: Duanne Guess on  03/04/2023 15:17:26 -------------------------------------------------------------------------------- Problem List Details Patient Name: Date of Service: [name] [name] 03/04/2023 2:45 PM Medical Record Number: [id number] Patient Account Number: [id number] Date of Birth/Sex: Treating RN: 04-20-1946 (77 y.o. F) Primary Care Provider: Dina Long Other Clinician: Referring Provider: Treating Provider/Extender: Pincus Badder in Treatment: 5 Active Problems ICD-10 Encounter Code Description Active Date MDM Diagnosis [phone] Non-pressure chronic ulcer of other part of left lower leg with fat layer exposed4/10/2022 No Yes I27.20 Pulmonary hypertension, unspecified 01/26/2023 No Yes I51.9 Heart disease, unspecified 01/26/2023 No Yes Inactive Problems Resolved Problems Electronic  Signature(s) Signed: 03/04/2023 3:00:14 PM By: Duanne Guess MD FACS Entered By: Duanne Guess on 03/04/2023 15:00:14 -------------------------------------------------------------------------------- Progress Note Details Patient Name: Date of Service: [name] [name] 03/04/2023 2:45 PM Medical Record Number: [id number] Patient Account Number: [id number] Date of Birth/Sex: Treating RN: [birth year] (77 y.o. F) Primary Care Provider: Dina Long Other Clinician: Referring Provider: Treating Provider/Extender: Xan, Tornabene, [name] ([id number]) [id number] Page 5 of 10 Weeks in Treatment: 5 Subjective Chief Complaint Information obtained from Patient Patient seen for complaints of Non-Healing Wound. History of Present Illness (HPI) ADMISSION 02/19/2022 This is a 77 year old woman with minimal pertinent medical history. She does have COPD and pulmonary hypertension, but is not diabetic and is not a smoker. About 6 months ago, she and her husband got a new beagle puppy. The puppy scratched her leg and the scratches ultimately deteriorated into  ulcerations. Apparently she sought care with a dermatologist who recommended applying a one-to-one mixture of peroxide and water to the wounds followed by a thick layer of Vaseline. She continue this for some time but then saw her primary care provider who told her to discontinue the peroxide. She has not been on any antibiotics for the scratches. Today, there are 3 separate wounds on her anterior tibial surface on the left. They are tender and her leg has localized swelling. There is yellow slough buildup in each of the wound bases. ABI in clinic today was normal at 1.16. 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 02/25/2022: Over the past week, the wounds themselves have not improved tremendously but she has noticed some stinging and the periwound skin is quite inflamed. I took a culture last week that had low levels of methicillin-resistant Staph aureus. Because it was fairly low level, I did not prescribe an oral antibiotic. I had planned to change her to mupirocin today, however. We have been using Santyl under Hydrofera Blue with 3 layer compression. 03/04/2022: The wounds have improved quite a bit over the past week. They are less painful and red. She has been taking the prescribed doxycycline but says that it gives her a stomachache. We have been using mupirocin with Hydrofera Blue and 3 layer compression. 03/11/2022: Continued improvement of the wounds. They have contracted quite a bit and have a good surface of healthy granulation tissue present. There is some dried eschar present around all 3 of the lesions. 03/17/2022: No significant change in the wounds from last week. The surface is clean with just a little bit of eschar and slough accumulation. No odor or drainage. No concern for infection. 03/26/2022: All of her wounds are smaller this week. There is good granulation tissue on the surface of each. No slough accumulation. There is just some dry skin around the wounds  but not actually involving the wounds. No concern for infection. 04/04/2022: The 2 proximal wounds have healed. The distal wound is much smaller and just has a little bit of dry eschar around the edges. 04/11/2022: Her wound is nearly healed. It is clean without concern for infection. 04/18/2022: Her wound has healed. READMISSION 01/26/2023 About 8 weeks ago, she sustained another scratch from her Beagle on her left anterior tibial surface.. She has been trying to treat the wound at home with Vaseline. There is thick slough buildup on the wound surface and the periwound skin looks a bit macerated. No overt sign of infection. 01/29/2023: She came in for an unscheduled visit today because her wound began bleeding. It apparently bled  quite profusely and her husband used an over-the- counter topical agent to get it to stop. On inspection today, he managed to achieve good hemostasis and there is no ongoing blood loss. 02/03/2023: No further issues with bleeding. There is minimal slough accumulation. Some hypertrophic granulation tissue present. 02/11/2023: She has had a lot more drainage over the past week and there has been more breakdown of the skin around her wound. The wound itself has expanded and there is quite a bit of slough on the surfaces. 02/18/2023: Her wound looks much better this week. The drainage has decreased and the periwound skin is in much improved condition. The wound is smaller and there is just 1 tiny satellite adjacent to the main wound. There is slough accumulation on the surfaces. The culture that I took last week returned with methicillin sensitive Staph aureus. She is currently taking Keflex for this. 02/25/2023: Her wound is smaller again this week with minimal slough accumulation. The periwound looks significantly improved. She has completed her oral Keflex. 03/04/2023: The wound continues to contract. The periwound is completely healed and looks like normal intact skin. There is some  slough on the surface of the wound overlying good granulation tissue. Patient History Information obtained from Patient, Caregiver. Family History Unknown History. Social History Former smoker, Marital Status - Married, Alcohol Use - Never, Drug Use - No History, Caffeine Use - Rarely. Medical History Eyes Patient has history of Cataracts - bil removed Denies history of Glaucoma, Optic Neuritis Ear/Nose/Mouth/Throat Denies history of Chronic sinus problems/congestion, Middle ear problems Hematologic/Lymphatic Patient has history of Anemia Respiratory Patient has history of Chronic Obstructive Pulmonary Disease (COPD) Cardiovascular Patient has history of Coronary Artery Disease, Peripheral Venous Disease - varicose veins Endocrine Denies history of Type I Diabetes, Type II Diabetes Genitourinary Denies history of End Stage Renal Disease Long, Elaine K ([id number]) [id number] Page 6 of 10 Immunological Patient has history of Raynaudoos Denies history of Lupus Erythematosus, Scleroderma Integumentary (Skin) Denies history of History of Burn Musculoskeletal Patient has history of Rheumatoid Arthritis, Osteoarthritis Denies history of Gout Oncologic Denies history of Received Chemotherapy, Received Radiation Psychiatric Denies history of Anorexia/bulimia, Confinement Anxiety Hospitalization/Surgery History - bil cataract removal. - left shoiulder replacement. - right rotator cuff repair. - multiple lumbar spine surgeries. - melanoma removed left arm and chest. - partial excision bone left 2nd toe. Medical A Surgical History Notes nd Hematologic/Lymphatic varicose vein of leg, mixed hyperlipidemia Respiratory dyspnea, chronic bronchitis, pulmonary hypertension, pulmonary emphysema Cardiovascular aortic calcification, tachycardia, left ventricular dysfunction, bradycardia Gastrointestinal Gerd Endocrine prediabetes,  hypothyroidism Genitourinary bladder disorder Integumentary (Skin) contact dermatitis Musculoskeletal acquired plantar porokeratosis, neurogenic claudication d/t lumbar spinal stenosis, supraspinatus tendinitis, restless leg syndrome, spinal stenosis of lumbar region Neurologic TIA, insomnia Objective Constitutional Slightly tachycardic. no acute distress. Vitals Time Taken: 2:30 PM, Height: 63 in, Weight: 115 lbs, BMI: 20.4, Temperature: 97.5 F, Pulse: 106 bpm, Respiratory Rate: 16 breaths/min, Blood Pressure: 133/73 mmHg. Respiratory Normal work of breathing on room air. General Notes: 03/04/2023: The wound continues to contract. The periwound is completely healed and looks like normal intact skin. There is some slough on the surface of the wound overlying good granulation tissue. Integumentary (Hair, Skin) Wound #4 status is Open. Original cause of wound was Trauma. The date acquired was: 11/27/2022. The wound has been in treatment 5 weeks. The wound is located on the Left,Anterior Lower Leg. The wound measures 2.8cm length x 1cm width x 0.1cm depth; 2.199cm^2 area and 0.22cm^3 volume. There is Fat Layer (  Subcutaneous Tissue) exposed. There is no tunneling or undermining noted. There is a medium amount of serosanguineous drainage noted. The wound margin is distinct with the outline attached to the wound base. There is medium (34-66%) red, pink granulation within the wound bed. There is a medium (34-66%) amount of necrotic tissue within the wound bed including Adherent Slough. The periwound skin appearance had no abnormalities noted for moisture. The periwound skin appearance exhibited: Hemosiderin Staining. The periwound skin appearance did not exhibit: Callus, Crepitus, Excoriation, Induration, Rash, Scarring, Atrophie Blanche, Cyanosis, Ecchymosis, Mottled, Pallor, Rubor, Erythema. Periwound temperature was noted as No Abnormality. Assessment Active Problems ICD-10 Non-pressure  chronic ulcer of other part of left lower leg with fat layer exposed Pulmonary hypertension, unspecified Heart disease, unspecified Procedures ADRIA, SWAFFORD ([id number]) [id number] Page 7 of 10 Wound #4 Pre-procedure diagnosis of Wound #4 is a Venous Leg Ulcer located on the Left,Anterior Lower Leg .Severity of Tissue Pre Debridement is: Fat layer exposed. There was a Selective/Open Wound Non-Viable Tissue Debridement with a total area of 2.2 sq cm performed by Duanne Guess, MD. With the following instrument(s): Curette to remove Non-Viable tissue/material. Material removed includes [facility] after achieving pain control using Lidocaine 4% Topical Solution. No specimens were taken. A time out was conducted at 14:40, prior to the start of the procedure. A Minimum amount of bleeding was controlled with Pressure. The procedure was tolerated well. Post Debridement Measurements: 2.8cm length x 1cm width x 0.1cm depth; 0.22cm^3 volume. Character of Wound/Ulcer Post Debridement is improved. Severity of Tissue Post Debridement is: Fat layer exposed. Post procedure Diagnosis Wound #4: Same as Pre-Procedure General Notes: scribed for Dr. Lady Gary by Elaine Bruin, RN. Plan Follow-up Appointments: Return Appointment in 1 week. - Dr. Lady Gary Room 2 Anesthetic: (In clinic) Topical Lidocaine 4% applied to wound bed Bathing/ Shower/ Hygiene: May shower with protection but do not get wound dressing(s) wet. Protect dressing(s) with water repellant cover (for example, large plastic bag) or a cast cover and may then take shower. Edema Control - Lymphedema / SCD / Other: Elevate legs to the level of the heart or above for 30 minutes daily and/or when sitting for 3-4 times a day throughout the day. Avoid standing for long periods of time. Exercise regularly If compression wraps slide down please call wound center and speak with a nurse. The following medication(s) was prescribed:  lidocaine topical 4 % cream cream topical was prescribed at facility WOUND #4: - Lower Leg Wound Laterality: Left, Anterior Cleanser: Soap and Water 1 x Per Week/30 Days Discharge Instructions: May shower and wash wound with dial antibacterial soap and water prior to dressing change. Cleanser: Vashe 5.8 (oz) 1 x Per Week/30 Days Discharge Instructions: Cleanse the wound with Vashe prior to applying a clean dressing using gauze sponges, not tissue or cotton balls. Peri-Wound Care: Triamcinolone 15 (g) 1 x Per Week/30 Days Discharge Instructions: mix with the zinc to periwound. Peri-Wound Care: Sween Lotion (Moisturizing lotion) 1 x Per Week/30 Days Discharge Instructions: Apply moisturizing lotion as directed Topical: Gentamicin 1 x Per Week/30 Days Discharge Instructions: As directed by physician Topical: Mupirocin Ointment 1 x Per Week/30 Days Discharge Instructions: Apply Mupirocin (Bactroban) as instructed Prim Dressing: Cutimed Sorbact Swab 1 x Per Week/30 Days ary Discharge Instructions: Apply to wound bed as instructed Com pression Wrap: Kerlix Roll 4.5x3.1 (in/yd) 1 x Per Week/30 Days Discharge Instructions: Apply Kerlix and Coban compression as directed. Com pression Wrap: Coban Self-Adherent Wrap 4x5 (in/yd) 1 x Per Week/30 Days  Discharge Instructions: Apply over Kerlix as directed. 03/04/2023: The wound continues to contract. The periwound is completely healed and looks like normal intact skin. There is some slough on the surface of the wound overlying good granulation tissue. I used a curette to debride the slough from the wound. We will continue the mixture of topical gentamicin and topical mupirocin under Sorbact with Kerlix and Coban wrap. Follow-up in 1 week. Electronic Signature(s) Signed: 03/04/2023 3:37:15 PM By: Duanne Guess MD FACS Previous Signature: 03/04/2023 3:17:38 PM Version By: Duanne Guess MD FACS Entered By: Duanne Guess on 03/04/2023  15:37:15 -------------------------------------------------------------------------------- HxROS Details Patient Name: Date of Service: [name] [name] 03/04/2023 2:45 PM Medical Record Number: [id number] Patient Account Number: [id number] Date of Birth/Sex: Treating RN: 21-Apr-1946 (77 y.o. F) Primary Care Provider: Dina Long Other Clinician: Referring Provider: Treating Provider/Extender: Pincus Badder in Treatment: 5 Information Obtained From Patient Caregiver [address] Elaine, Long ([id number]) [id number] Page 8 of 10 Medical History: Positive for: Cataracts - bil removed Negative for: Glaucoma; Optic Neuritis Ear/Nose/Mouth/Throat Medical History: Negative for: Chronic sinus problems/congestion; Middle ear problems Hematologic/Lymphatic Medical History: Positive for: Anemia Past Medical History Notes: varicose vein of leg, mixed hyperlipidemia Respiratory Medical History: Positive for: Chronic Obstructive Pulmonary Disease (COPD) Past Medical History Notes: dyspnea, chronic bronchitis, pulmonary hypertension, pulmonary emphysema Cardiovascular Medical History: Positive for: Coronary Artery Disease; Peripheral Venous Disease - varicose veins Past Medical History Notes: aortic calcification, tachycardia, left ventricular dysfunction, bradycardia Gastrointestinal Medical History: Past Medical History Notes: Gerd Endocrine Medical History: Negative for: Type I Diabetes; Type II Diabetes Past Medical History Notes: prediabetes, hypothyroidism Genitourinary Medical History: Negative for: End Stage Renal Disease Past Medical History Notes: bladder disorder Immunological Medical History: Positive for: Raynauds Negative for: Lupus Erythematosus; Scleroderma Integumentary (Skin) Medical History: Negative for: History of Burn Past Medical History Notes: contact dermatitis Musculoskeletal Medical History: Positive  for: Rheumatoid Arthritis; Osteoarthritis Negative for: Gout Past Medical History Notes: acquired plantar porokeratosis, neurogenic claudication d/t lumbar spinal stenosis, supraspinatus tendinitis, restless leg syndrome, spinal stenosis of lumbar region Neurologic Medical History: Past Medical History Notes: TIA, insomnia Oncologic Medical HistorySANTORIA, DORAN ([id number]) [id number] Page 9 of 10 Negative for: Received Chemotherapy; Received Radiation Psychiatric Medical History: Negative for: Anorexia/bulimia; Confinement Anxiety HBO Extended History Items Eyes: Cataracts Immunizations Pneumococcal Vaccine: Received Pneumococcal Vaccination: Yes Received Pneumococcal Vaccination On or After 60th Birthday: Yes Implantable Devices None Hospitalization / Surgery History Type of Hospitalization/Surgery bil cataract removal left shoiulder replacement right rotator cuff repair multiple lumbar spine surgeries melanoma removed left arm and chest partial excision bone left 2nd toe Family and Social History Unknown History: Yes; Former smoker; Marital Status - Married; Alcohol Use: Never; Drug Use: No History; Caffeine Use: Rarely; Financial Concerns: No; Food, Clothing or Shelter Needs: No; Support System Lacking: No; Transportation Concerns: No Psychologist, prison and probation services) Signed: 03/04/2023 4:23:55 PM By: Duanne Guess MD FACS Entered By: Duanne Guess on 03/04/2023 15:15:26 -------------------------------------------------------------------------------- SuperBill Details Patient Name: Date of Service: [name] [name] 03/04/2023 Medical Record Number: [id number] Patient Account Number: [id number] Date of Birth/Sex: Treating RN: 1946/03/13 (77 y.o. F) Primary Care Provider: Dina Long Other Clinician: Referring Provider: Treating Provider/Extender: Pincus Badder in Treatment: 5 Diagnosis Coding ICD-10 Codes Code  Description [phone] Non-pressure chronic ulcer of other part of left lower leg with fat layer exposed I27.20 Pulmonary hypertension, unspecified I51.9 Heart disease, unspecified Facility Procedures : CPT4 Code: [id number] Description: 97597 - DEBRIDE WOUND 1ST 20 SQ CM OR <  ICD-10 Diagnosis Description L97.822 Non-pressure chronic ulcer of other part of left lower leg with fat layer expose Modifier: d Quantity: 1 Physician Procedures : CPT4 Code Description Modifier [id number] - WC PHYS LEVEL 4 - EST PT 25 ICD-10 Diagnosis Description L97.822 Non-pressure chronic ulcer of other part of left lower leg with fat layer exposed I27.20 Pulmonary hypertension, unspecified I51.9 Heart  disease, unspecified Long, Elaine K ([id number]) [id number] Quantity: 1 7.pdf Page 10 of 10 : [id number] 97597 - WC PHYS DEBR WO ANESTH 20 SQ CM 1 ICD-10 Diagnosis Description L97.822 Non-pressure chronic ulcer of other part of left lower leg with fat layer exposed Quantity: Electronic Signature(s) Signed: 03/04/2023 3:37:28 PM By: Duanne Guess MD FACS Entered By: Duanne Guess on 03/04/2023 15:37:28

## 2023-03-11 ENCOUNTER — Encounter (HOSPITAL_BASED_OUTPATIENT_CLINIC_OR_DEPARTMENT_OTHER): Payer: PPO | Admitting: General Surgery

## 2023-03-11 DIAGNOSIS — L97822 Non-pressure chronic ulcer of other part of left lower leg with fat layer exposed: Secondary | ICD-10-CM | POA: Diagnosis not present

## 2023-03-17 ENCOUNTER — Encounter (HOSPITAL_BASED_OUTPATIENT_CLINIC_OR_DEPARTMENT_OTHER): Payer: PPO | Admitting: General Surgery

## 2023-03-17 DIAGNOSIS — L97822 Non-pressure chronic ulcer of other part of left lower leg with fat layer exposed: Secondary | ICD-10-CM | POA: Diagnosis not present

## 2023-03-30 ENCOUNTER — Encounter (HOSPITAL_BASED_OUTPATIENT_CLINIC_OR_DEPARTMENT_OTHER): Payer: PPO | Attending: General Surgery | Admitting: General Surgery

## 2023-03-30 DIAGNOSIS — I519 Heart disease, unspecified: Secondary | ICD-10-CM | POA: Diagnosis not present

## 2023-03-30 DIAGNOSIS — L97122 Non-pressure chronic ulcer of left thigh with fat layer exposed: Secondary | ICD-10-CM | POA: Insufficient documentation

## 2023-03-30 DIAGNOSIS — J449 Chronic obstructive pulmonary disease, unspecified: Secondary | ICD-10-CM | POA: Insufficient documentation

## 2023-03-30 DIAGNOSIS — I272 Pulmonary hypertension, unspecified: Secondary | ICD-10-CM | POA: Diagnosis not present

## 2023-03-30 DIAGNOSIS — L97822 Non-pressure chronic ulcer of other part of left lower leg with fat layer exposed: Secondary | ICD-10-CM | POA: Insufficient documentation

## 2023-04-06 ENCOUNTER — Encounter (HOSPITAL_BASED_OUTPATIENT_CLINIC_OR_DEPARTMENT_OTHER): Payer: PPO | Admitting: General Surgery

## 2023-04-06 DIAGNOSIS — L97822 Non-pressure chronic ulcer of other part of left lower leg with fat layer exposed: Secondary | ICD-10-CM | POA: Diagnosis not present

## 2023-04-13 ENCOUNTER — Encounter (HOSPITAL_BASED_OUTPATIENT_CLINIC_OR_DEPARTMENT_OTHER): Payer: PPO | Admitting: General Surgery

## 2023-04-13 DIAGNOSIS — L97822 Non-pressure chronic ulcer of other part of left lower leg with fat layer exposed: Secondary | ICD-10-CM | POA: Diagnosis not present

## 2023-04-13 NOTE — Progress Notes (Signed)
ALIYAHA, AULAKH ([id number]) [id number] Page 1 of 12 Visit Report for 04/13/2023 Arrival Information Details Patient Name: Date of Service: DONNALYNN, SIRLES [name] 04/13/2023 10:45 A M Medical Record Number: [id number] Patient Account Number: [id number] Date of Birth/Sex: Treating RN: 10-21-1946 (77 y.o. [name] Primary Care Althia Egolf: Dina Rich Other Clinician: Referring Presly Steinruck: Treating Saverio Kader/Extender: Pincus Badder in Treatment: 11 Visit Information History Since Last Visit Added or deleted any medications: No Patient Arrived: Ambulatory Any new allergies or adverse reactions: No Arrival Time: 10:47 Had a fall or experienced change in No Accompanied By: HUSBAND activities of daily living that may affect Transfer Assistance: None risk of falls: Patient Identification Verified: Yes Signs or symptoms of abuse/neglect since last visito No Secondary Verification Process Completed: Yes Hospitalized since last visit: No Patient Requires Transmission-Based Precautions: No Implantable device outside of the clinic excluding No Patient Has Alerts: Yes cellular tissue based products placed in the center Patient Alerts: ABI L 1.16 02/19/22 since last visit: Has Dressing in Place as Prescribed: Yes Has Compression in Place as Prescribed: Yes Pain Present Now: Yes Electronic Signature(s) Signed: 04/13/2023 3:30:01 PM By: Samuella Bruin Entered By: Samuella Bruin on 04/13/2023 10:51:48 -------------------------------------------------------------------------------- Encounter Discharge Information Details Patient Name: Date of Service: [name] 04/13/2023 10:45 A M Medical Record Number: [id number] Patient Account Number: [id number] Date of Birth/Sex: Treating RN: 05-21-1946 (77 y.o. [name] Primary Care Brandn Mcgath: Dina Rich Other Clinician: Referring Tanayah Squitieri: Treating Scarlett Portlock/Extender: Pincus Badder in Treatment: 11 Encounter Discharge Information Items Post Procedure Vitals Discharge Condition: Stable Temperature (F): 98.4 Ambulatory Status: Ambulatory Pulse (bpm): 109 Discharge Destination: Home Respiratory Rate (breaths/min): 16 Transportation: Private Auto Blood Pressure (mmHg): 141/59 Accompanied By: Gayla Doss Schedule Follow-up Appointment: Yes Clinical Summary of Care: Patient Declined Electronic Signature(s) Signed: 04/13/2023 3:30:01 PM By: Samuella Bruin Entered By: Samuella Bruin on 04/13/2023 11:23:21 [name] ([id number]) [id number] Page 2 of 12 -------------------------------------------------------------------------------- Lower Extremity Assessment Details Patient Name: Date of Service: MAKINLEE, DUNWORTH [name] 04/13/2023 10:45 A M Medical Record Number: [id number] Patient Account Number: [id number] Date of Birth/Sex: Treating RN: 09-15-1946 (77 y.o. [name] Primary Care Mason Dibiasio: Dina Rich Other Clinician: Referring Sameena Artus: Treating Tarin Johndrow/Extender: Pincus Badder in Treatment: 11 Edema Assessment Assessed: [name]: No] [name]: No] Edema: [Left: N] [Right: o] Calf Left: Right: Point of Measurement: 25 cm From Medial Instep 31 cm Ankle Left: Right: Point of Measurement: 10 cm From Medial Instep 18 cm Vascular Assessment Pulses: Dorsalis Pedis Palpable: [Left:Yes] Electronic Signature(s) Signed: 04/13/2023 3:30:01 PM By: Samuella Bruin Entered By: Samuella Bruin on 04/13/2023 11:02:00 -------------------------------------------------------------------------------- Multi Wound Chart Details Patient Name: Date of Service: [name] 04/13/2023 10:45 A M Medical Record Number: [id number] Patient Account Number: [id number] Date of Birth/Sex: Treating RN: 08-03-1946 (77 y.o. F) Primary Care Soraida Vickers: Dina Rich Other Clinician: Referring  Janani Chamber: Treating Mame Twombly/Extender: Pincus Badder in Treatment: 11 Vital Signs Height(in): 63 Pulse(bpm): 109 Weight(lbs): 115 Blood Pressure(mmHg): 141/59 Body Mass Index(BMI): 20.4 Temperature(F): 98.4 Respiratory Rate(breaths/min): 16 [4:Photos:] [6:[id number] Page 3 of 12] Left, Anterior Lower Leg Left, Proximal, Anterior Lower Leg Left Knee Wound Location: Trauma Skin T ear/Laceration Skin T ear/Laceration Wounding Event: Venous Leg Ulcer Skin T ear Skin T ear Primary Etiology: Cataracts, Anemia, Chronic Cataracts, Anemia, Chronic Cataracts, Anemia, Chronic Comorbid History: Obstructive Pulmonary Disease Obstructive Pulmonary Disease Obstructive Pulmonary Disease (COPD), Coronary Artery Disease, (COPD), Coronary Artery Disease, (COPD), Coronary Artery Disease, Peripheral Venous  Disease, Peripheral Venous Disease, Peripheral Venous Disease, Raynauds, Rheumatoid Arthritis, Raynauds, Rheumatoid Arthritis, Raynauds, Rheumatoid Arthritis, Osteoarthritis Osteoarthritis Osteoarthritis 11/27/2022 04/02/2023 04/02/2023 Date Acquired: 11 1 1  Weeks of Treatment: Open Open Open Wound Status: No No No Wound Recurrence: 12x10.5x0.2 7.5x5x0.1 2x3x0.1 Measurements L x W x D (cm) 98.96 29.452 4.712 A (cm) : rea 19.792 2.945 0.471 Volume (cm) : -1035.10% -4.20% 41.20% % Reduction in A rea: -2169.70% -4.20% 41.20% % Reduction in Volume: Full Thickness Without Exposed Full Thickness Without Exposed Full Thickness Without Exposed Classification: Support Structures Support Structures Support Structures Large Medium Medium Exudate A mount: Serosanguineous Serosanguineous Serosanguineous Exudate Type: red, brown red, brown red, brown Exudate Color: Distinct, outline attached Distinct, outline attached Distinct, outline attached Wound Margin: Medium (34-66%) Large (67-100%) Large (67-100%) Granulation A mount: Red, Pink Red  Red Granulation Quality: Medium (34-66%) Small (1-33%) Small (1-33%) Necrotic A mount: Eschar, Adherent Slough Adherent [employer] Necrotic Tissue: Fat Layer (Subcutaneous Tissue): Yes Fat Layer (Subcutaneous Tissue): Yes Fat Layer (Subcutaneous Tissue): Yes Exposed Structures: Fascia: No Fascia: No Fascia: No Tendon: No Tendon: No Tendon: No Muscle: No Muscle: No Muscle: No Joint: No Joint: No Joint: No Bone: No Bone: No Bone: No Small (1-33%) None None Epithelialization: Debridement - Selective/Open Wound Debridement - Selective/Open Wound Debridement - Selective/Open Wound Debridement: Pre-procedure Verification/Time Out 11:18 11:18 11:18 Taken: Lidocaine 4% Topical Solution Lidocaine 4% Topical Solution Lidocaine 4% Topical Solution Pain Control: [employer] Tissue Debrided: Non-Viable Tissue Non-Viable Tissue Non-Viable Tissue Level: 19.78 14.72 4.71 Debridement A (sq cm): rea Curette Curette Curette Instrument: Minimum Minimum Minimum Bleeding: Pressure Pressure Pressure Hemostasis A chieved: Procedure was tolerated well Procedure was tolerated well Procedure was tolerated well Debridement Treatment Response: 12x10.5x0.2 7.5x5x0.1 2x3x0.1 Post Debridement Measurements L x W x D (cm) 19.792 2.945 0.471 Post Debridement Volume: (cm) Excoriation: Yes No Abnormalities Noted Scarring: Yes Periwound Skin Texture: Rash: Yes Induration: No Callus: No Crepitus: No Scarring: No Maceration: Yes No Abnormalities Noted No Abnormalities Noted Periwound Skin Moisture: Dry/Scaly: No Hemosiderin Staining: Yes Rubor: Yes Ecchymosis: Yes Periwound Skin Color: Atrophie Blanche: No Ecchymosis: No Cyanosis: No Ecchymosis: No Erythema: No Mottled: No Pallor: No Rubor: No No Abnormality No Abnormality No Abnormality Temperature: Debridement Debridement Debridement Procedures Performed: Treatment Notes Wound #4 (Lower Leg) Wound  Laterality: Left, Anterior Cleanser Soap and Water Discharge Instruction: May shower and wash wound with dial antibacterial soap and water prior to dressing change. Vashe 5.8 (oz) Discharge Instruction: Cleanse the wound with Vashe prior to applying a clean dressing using gauze sponges, not tissue or cotton balls. Peri-Wound Care Topical Primary Dressing Maxorb Extra Ag+ Alginate Dressing, 2x2 (in/in) [place], Nohealani K ([id number]) [id number].pdf Page 4 of 12 Discharge Instruction: Apply to wound bed as instructed Secondary Dressing T Non-adherent Dressing, 2x3 in elfa Discharge Instruction: Apply over primary dressing as directed. Secured With [employer] 4x5 (in/yd) Discharge Instruction: Secure with Coban as directed. Kerlix Roll Sterile, 4.5x3.1 (in/yd) Discharge Instruction: Secure with Kerlix as directed. Compression Wrap Compression Stockings Add-Ons Wound #5 (Lower Leg) Wound Laterality: Left, Anterior, Proximal Cleanser Soap and Water Discharge Instruction: May shower and wash wound with dial antibacterial soap and water prior to dressing change. Vashe 5.8 (oz) Discharge Instruction: Cleanse the wound with Vashe prior to applying a clean dressing using gauze sponges, not tissue or cotton balls. Peri-Wound Care Topical Primary Dressing Maxorb Extra Ag+ Alginate Dressing, 2x2 (in/in) Discharge Instruction: Apply to wound bed as instructed Secondary Dressing T Non-adherent Dressing, 2x3 in  elfa Discharge Instruction: Apply over primary dressing as directed. Secured With [employer] 4x5 (in/yd) Discharge Instruction: Secure with Coban as directed. Kerlix Roll Sterile, 4.5x3.1 (in/yd) Discharge Instruction: Secure with Kerlix as directed. Compression Wrap Compression Stockings Add-Ons Wound #6 (Knee) Wound Laterality: Left Cleanser Soap and Water Discharge Instruction: May shower and wash wound with dial antibacterial  soap and water prior to dressing change. Vashe 5.8 (oz) Discharge Instruction: Cleanse the wound with Vashe prior to applying a clean dressing using gauze sponges, not tissue or cotton balls. Peri-Wound Care Topical Primary Dressing Maxorb Extra Ag+ Alginate Dressing, 2x2 (in/in) Discharge Instruction: Apply to wound bed as instructed Secondary Dressing T Non-adherent Dressing, 2x3 in elfa Discharge Instruction: Apply over primary dressing as directed. Secured With Elastic Bandage 4 inch (ACE bandage) Discharge Instruction: Secure with ACE bandage as directed. Kerlix Roll Sterile, 4.5x3.1 (in/yd) LISSETH, BASNETT K ([id number]) [id number] Page 5 of 12 Discharge Instruction: Secure with Kerlix as directed. Compression Wrap Compression Stockings Add-Ons Electronic Signature(s) Signed: 04/13/2023 11:28:15 AM By: Duanne Guess MD FACS Entered By: Duanne Guess on 04/13/2023 11:28:15 -------------------------------------------------------------------------------- Multi-Disciplinary Care Plan Details Patient Name: Date of Service: [name] 04/13/2023 10:45 A M Medical Record Number: [id number] Patient Account Number: [id number] Date of Birth/Sex: Treating RN: 1946-09-07 (77 y.o. [name] Primary Care Nikolis Berent: Dina Rich Other Clinician: Referring Rayya Yagi: Treating Kyani Simkin/Extender: Pincus Badder in Treatment: 11 Active Inactive Nutrition Nursing Diagnoses: Potential for alteratiion in Nutrition/Potential for imbalanced nutrition Goals: Patient/caregiver agrees to and verbalizes understanding of need to obtain nutritional consultation Date Initiated: 01/26/2023 Target Resolution Date: 05/29/2023 Goal Status: Active Patient/caregiver will maintain therapeutic glucose control Date Initiated: 01/26/2023 Target Resolution Date: 05/29/2023 Goal Status: Active Interventions: Assess patient nutrition upon admission  and as needed per policy Provide education on nutrition Treatment Activities: Giving encouragement to exercise : 01/26/2023 Notes: Pain, Acute or Chronic Nursing Diagnoses: Pain, acute or chronic: actual or potential Potential alteration in comfort, pain Goals: Patient will verbalize adequate pain control and receive pain control interventions during procedures as needed Date Initiated: 01/26/2023 Target Resolution Date: 05/29/2023 Goal Status: Active Patient/caregiver will verbalize comfort level met Date Initiated: 01/26/2023 Target Resolution Date: 05/29/2023 Goal Status: Active Interventions: Complete pain assessment as per visit requirements Encourage patient to take pain medications as prescribed Provide education on pain management Treatment Activities: Administer pain control measures as ordered : 01/26/2023 ASHLIN, MARSILI ([id number]) [id number] Page 6 of 12 Notes: Electronic Signature(s) Signed: 04/13/2023 3:30:01 PM By: Gelene Mink By: Samuella Bruin on 04/13/2023 11:22:21 -------------------------------------------------------------------------------- Pain Assessment Details Patient Name: Date of Service: [name] 04/13/2023 10:45 A M Medical Record Number: [id number] Patient Account Number: [id number] Date of Birth/Sex: Treating RN: 1945/12/13 (77 y.o. [name] Primary Care Kordae Buonocore: Dina Rich Other Clinician: Referring Syris Brookens: Treating Maisha Bogen/Extender: Pincus Badder in Treatment: 11 Active Problems Location of Pain Severity and Description of Pain Patient Has Paino Yes Site Locations Pain Location: Pain in Ulcers Duration of the Pain. Constant / Intermittento Intermittent Rate the pain. Current Pain Level: 9 Character of Pain Describe the Pain: Burning, Stabbing Pain Management and Medication Current Pain Management: Medication: Yes Electronic Signature(s) Signed:  04/13/2023 3:30:01 PM By: Samuella Bruin Entered By: Samuella Bruin on 04/13/2023 10:52:04 -------------------------------------------------------------------------------- Patient/Caregiver Education Details Patient Name: Date of Service: [name] 6/17/2024andnbsp10:45 A M Medical Record Number: [id number] Patient Account Number: [id number] Date of Birth/Gender: Treating RN: 1946-04-14 (77 y.o. [name] Primary Care  Physician: Dina Rich Other Clinician: Referring Physician: Treating Physician/Extender: Pincus Badder in Treatment: [address] [place] K ([id number]) [id number] Page 7 of 12 Education Assessment Education Provided To: Patient Education Topics Provided Infection: Methods: Explain/Verbal Responses: Reinforcements needed, State content correctly Electronic Signature(s) Signed: 04/13/2023 3:30:01 PM By: Samuella Bruin Entered By: Samuella Bruin on 04/13/2023 11:22:36 -------------------------------------------------------------------------------- Wound Assessment Details Patient Name: Date of Service: [name] 04/13/2023 10:45 A M Medical Record Number: [id number] Patient Account Number: [id number] Date of Birth/Sex: Treating RN: 09/16/1946 (77 y.o. [name] Primary Care Darius Lundberg: Dina Rich Other Clinician: Referring Hardeep Reetz: Treating Storey Stangeland/Extender: Pincus Badder in Treatment: 11 Wound Status Wound Number: 4 Primary Venous Leg Ulcer Etiology: Wound Location: Left, Anterior Lower Leg Wound Open Wounding Event: Trauma Status: Date Acquired: 11/27/2022 Comorbid Cataracts, Anemia, Chronic Obstructive Pulmonary Disease Weeks Of Treatment: 11 History: (COPD), Coronary Artery Disease, Peripheral Venous Disease, Clustered Wound: No Raynauds, Rheumatoid Arthritis, Osteoarthritis Photos Wound Measurements Length: (cm) 12 Width: (cm)  10.5 Depth: (cm) 0.2 Area: (cm) 98.96 Volume: (cm) 19.792 % Reduction in Area: -1035.1% % Reduction in Volume: -2169.7% Epithelialization: Small (1-33%) Tunneling: No Undermining: No Wound Description Classification: Full Thickness Without Exposed Support Structures Wound Margin: Distinct, outline attached Exudate Amount: Large Exudate Type: Serosanguineous Exudate Color: red, brown Foul Odor After Cleansing: No Slough/Fibrino Yes Wound Bed Granulation Amount: Medium (34-66%) Exposed DYNASTY, MEES K ([id number]) [id number] Page 8 of 12 Granulation Quality: Red, Pink Fascia Exposed: No Necrotic Amount: Medium (34-66%) Fat Layer (Subcutaneous Tissue) Exposed: Yes Necrotic Quality: Eschar, Adherent Slough Tendon Exposed: No Muscle Exposed: No Joint Exposed: No Bone Exposed: No Periwound Skin Texture Texture Color No Abnormalities Noted: No No Abnormalities Noted: No Callus: No Atrophie Blanche: No Crepitus: No Cyanosis: No Excoriation: Yes Ecchymosis: No Induration: No Erythema: No Rash: Yes Hemosiderin Staining: Yes Scarring: No Mottled: No Pallor: No Moisture Rubor: No No Abnormalities Noted: Yes Temperature / Pain Temperature: No Abnormality Treatment Notes Wound #4 (Lower Leg) Wound Laterality: Left, Anterior Cleanser Soap and Water Discharge Instruction: May shower and wash wound with dial antibacterial soap and water prior to dressing change. Vashe 5.8 (oz) Discharge Instruction: Cleanse the wound with Vashe prior to applying a clean dressing using gauze sponges, not tissue or cotton balls. Peri-Wound Care Topical Primary Dressing Maxorb Extra Ag+ Alginate Dressing, 2x2 (in/in) Discharge Instruction: Apply to wound bed as instructed Secondary Dressing T Non-adherent Dressing, 2x3 in elfa Discharge Instruction: Apply over primary dressing as directed. Secured With [employer] 4x5 (in/yd) Discharge  Instruction: Secure with Coban as directed. Kerlix Roll Sterile, 4.5x3.1 (in/yd) Discharge Instruction: Secure with Kerlix as directed. Compression Wrap Compression Stockings Add-Ons Electronic Signature(s) Signed: 04/13/2023 3:30:01 PM By: Samuella Bruin Entered By: Samuella Bruin on 04/13/2023 11:07:08 -------------------------------------------------------------------------------- Wound Assessment Details Patient Name: Date of Service: [name] 04/13/2023 10:45 A M Medical Record Number: [id number] Patient Account Number: [id number] Date of Birth/Sex: Treating RN: [birth year] (77 y.o. [name] Primary Care Dakotah Orrego: Dina Rich Other Clinician: Referring Nicklaus Alviar: Treating Lilee Aldea/Extender: Pincus Badder in Treatment: [address], [place] K ([id number]) [id number] Page 9 of 12 Wound Status Wound Number: 5 Primary Skin Tear Etiology: Wound Location: Left, Proximal, Anterior Lower Leg Wound Open Wounding Event: Skin Tear/Laceration Status: Date Acquired: 04/02/2023 Comorbid Cataracts, Anemia, Chronic Obstructive Pulmonary Disease Weeks Of Treatment: 1 History: (COPD), Coronary Artery Disease, Peripheral Venous Disease, Clustered Wound: No Raynauds, Rheumatoid Arthritis, Osteoarthritis Photos Wound Measurements Length: (cm) 7.5 Width: (  cm) 5 Depth: (cm) 0.1 Area: (cm) 29.452 Volume: (cm) 2.945 % Reduction in Area: -4.2% % Reduction in Volume: -4.2% Epithelialization: None Tunneling: No Undermining: No Wound Description Classification: Full Thickness Without Exposed Support Structures Wound Margin: Distinct, outline attached Exudate Amount: Medium Exudate Type: Serosanguineous Exudate Color: red, brown Foul Odor After Cleansing: No Slough/Fibrino Yes Wound Bed Granulation Amount: Large (67-100%) Exposed Structure Granulation Quality: Red Fascia Exposed: No Necrotic Amount: Small (1-33%) Fat  Layer (Subcutaneous Tissue) Exposed: Yes Necrotic Quality: Adherent Slough Tendon Exposed: No Muscle Exposed: No Joint Exposed: No Bone Exposed: No Periwound Skin Texture Texture Color No Abnormalities Noted: Yes No Abnormalities Noted: No Ecchymosis: No Moisture Rubor: Yes No Abnormalities Noted: Yes Temperature / Pain Temperature: No Abnormality Treatment Notes Wound #5 (Lower Leg) Wound Laterality: Left, Anterior, Proximal Cleanser Soap and Water Discharge Instruction: May shower and wash wound with dial antibacterial soap and water prior to dressing change. Vashe 5.8 (oz) Discharge Instruction: Cleanse the wound with Vashe prior to applying a clean dressing using gauze sponges, not tissue or cotton balls. Peri-Wound Care Topical Primary Dressing Maxorb Extra Ag+ Alginate Dressing, 2x2 (in/in) Discharge Instruction: Apply to wound bed as instructed Secondary Dressing MARCHE, GRANIER ([id number]) [id number] Page 10 of 12 T Non-adherent Dressing, 2x3 in elfa Discharge Instruction: Apply over primary dressing as directed. Secured With [employer] 4x5 (in/yd) Discharge Instruction: Secure with Coban as directed. Kerlix Roll Sterile, 4.5x3.1 (in/yd) Discharge Instruction: Secure with Kerlix as directed. Compression Wrap Compression Stockings Add-Ons Electronic Signature(s) Signed: 04/13/2023 3:30:01 PM By: Samuella Bruin Entered By: Samuella Bruin on 04/13/2023 11:07:32 -------------------------------------------------------------------------------- Wound Assessment Details Patient Name: Date of Service: [name] 04/13/2023 10:45 A M Medical Record Number: [id number] Patient Account Number: [id number] Date of Birth/Sex: Treating RN: 02-27-1946 (77 y.o. [name] Primary Care Hoy Fallert: Dina Rich Other Clinician: Referring Saralynn Langhorst: Treating Jamiyah Dingley/Extender: Pincus Badder in  Treatment: 11 Wound Status Wound Number: 6 Primary Skin Tear Etiology: Wound Location: Left Knee Wound Open Wounding Event: Skin Tear/Laceration Status: Date Acquired: 04/02/2023 Comorbid Cataracts, Anemia, Chronic Obstructive Pulmonary Disease Weeks Of Treatment: 1 History: (COPD), Coronary Artery Disease, Peripheral Venous Disease, Clustered Wound: No Raynauds, Rheumatoid Arthritis, Osteoarthritis Photos Wound Measurements Length: (cm) 2 Width: (cm) 3 Depth: (cm) 0.1 Area: (cm) 4.712 Volume: (cm) 0.471 % Reduction in Area: 41.2% % Reduction in Volume: 41.2% Epithelialization: None Tunneling: No Undermining: No Wound Description Classification: Full Thickness Without Exposed Suppor Wound Margin: Distinct, outline attached Exudate Amount: Medium Exudate Type: Serosanguineous Exudate Color: red, brown t Structures Foul Odor After Cleansing: No Slough/Fibrino Yes Wound Bed Granulation Amount: Large (67-100%) Exposed IFEOMA, MIDURA K ([id number]) [id number] Page 11 of 12 Granulation Quality: Red Fascia Exposed: No Necrotic Amount: Small (1-33%) Fat Layer (Subcutaneous Tissue) Exposed: Yes Necrotic Quality: Adherent Slough Tendon Exposed: No Muscle Exposed: No Joint Exposed: No Bone Exposed: No Periwound Skin Texture Texture Color No Abnormalities Noted: No No Abnormalities Noted: No Scarring: Yes Ecchymosis: Yes Moisture Temperature / Pain No Abnormalities Noted: Yes Temperature: No Abnormality Treatment Notes Wound #6 (Knee) Wound Laterality: Left Cleanser Soap and Water Discharge Instruction: May shower and wash wound with dial antibacterial soap and water prior to dressing change. Vashe 5.8 (oz) Discharge Instruction: Cleanse the wound with Vashe prior to applying a clean dressing using gauze sponges, not tissue or cotton balls. Peri-Wound Care Topical Primary Dressing Maxorb Extra Ag+ Alginate Dressing, 2x2  (in/in) Discharge Instruction: Apply to wound bed as instructed Secondary Dressing T  Non-adherent Dressing, 2x3 in elfa Discharge Instruction: Apply over primary dressing as directed. Secured With Elastic Bandage 4 inch (ACE bandage) Discharge Instruction: Secure with ACE bandage as directed. Kerlix Roll Sterile, 4.5x3.1 (in/yd) Discharge Instruction: Secure with Kerlix as directed. Compression Wrap Compression Stockings Add-Ons Electronic Signature(s) Signed: 04/13/2023 3:30:01 PM By: Samuella Bruin Entered By: Samuella Bruin on 04/13/2023 11:07:56 -------------------------------------------------------------------------------- Vitals Details Patient Name: Date of Service: [name] 04/13/2023 10:45 A M Medical Record Number: [id number] Patient Account Number: [id number] Date of Birth/Sex: Treating RN: 05-Feb-1946 (77 y.o. [name] Primary Care Orrin Yurkovich: Dina Rich Other Clinician: Referring Cledith Abdou: Treating Iyania Denne/Extender: Pincus Badder in Treatment: 11 Vital Signs Time Taken: 10:52 Temperature (F): 98.4 Height (in): 63 Pulse (bpm): 109 Weight (lbs): 115 Respiratory Rate (breaths/min): 16 Body Mass Index (BMI): 20.4 Blood Pressure (mmHg): 141/59 ARNETIA, FUSILIER K ([id number]) [id number] Page 12 of 12 Reference Range: 80 - 120 mg / dl Electronic Signature(s) Signed: 04/13/2023 3:30:01 PM By: Samuella Bruin Entered By: [id number] [id number] on 04/13/2023 10:52:22

## 2023-04-13 NOTE — Progress Notes (Signed)
HALLE, HOPPS ([id number]) [id number] Page 1 of 13 Visit Report for 04/13/2023 Chief Complaint Document Details Patient Name: Date of Service: [name], [name] RMA Long. 04/13/2023 10:45 A M Medical Record Number: [id number] Patient Account Number: [id number] Date of Birth/Sex: Treating RN: Apr 29, 1946 (77 y.o. F) Primary Care Provider: Dina Rich Other Clinician: Referring Provider: Treating Provider/Extender: Pincus Badder in Treatment: 11 Information Obtained from: Patient Chief Complaint Patient seen for complaints of Non-Healing Wound. Electronic Signature(s) Signed: 04/13/2023 11:28:22 AM By: Duanne Guess MD FACS Entered By: Duanne Guess on 04/13/2023 11:28:22 -------------------------------------------------------------------------------- Debridement Details Patient Name: Date of Service: Elaine Hurst RMA Long. 04/13/2023 10:45 A M Medical Record Number: [id number] Patient Account Number: [id number] Date of Birth/Sex: Treating RN: 03/15/1946 (77 y.o. [name] Primary Care Provider: Dina Rich Other Clinician: Referring Provider: Treating Provider/Extender: Pincus Badder in Treatment: 11 Debridement Performed for Assessment: Wound #6 Left Knee Performed By: Physician Duanne Guess, MD Debridement Type: Debridement Level of Consciousness (Pre-procedure): Awake and Alert Pre-procedure Verification/Time Out Yes - 11:18 Taken: Start Time: 11:18 Pain Control: Lidocaine 4% T opical Solution Percent of Wound Bed Debrided: 100% T Area Debrided (cm): otal 4.71 Tissue and other material debrided: Non-Viable, Slough, Slough Level: Non-Viable Tissue Debridement Description: Selective/Open Wound Instrument: Curette Bleeding: Minimum Hemostasis Achieved: Pressure Response to Treatment: Procedure was tolerated well Level of Consciousness (Post- Awake and Alert procedure): Post Debridement  Measurements of Total Wound Length: (cm) 2 Width: (cm) 3 Depth: (cm) 0.1 Volume: (cm) 0.471 Character of Wound/Ulcer Post Debridement: Improved Post Procedure Diagnosis Same as Pre-procedure [name], [name] ([id number]) [id number] Page 2 of 13 Notes scribed for Dr. Lady Gary by Samuella Bruin, RN Electronic Signature(s) Signed: 04/13/2023 12:44:54 PM By: Duanne Guess MD FACS Signed: 04/13/2023 3:30:01 PM By: Gelene Mink By: Samuella Bruin on 04/13/2023 11:21:26 -------------------------------------------------------------------------------- Debridement Details Patient Name: Date of Service: Elaine Hurst RMA Long. 04/13/2023 10:45 A M Medical Record Number: [id number] Patient Account Number: [id number] Date of Birth/Sex: Treating RN: Feb 26, 1946 (77 y.o. [name] Primary Care Provider: Dina Rich Other Clinician: Referring Provider: Treating Provider/Extender: Pincus Badder in Treatment: 11 Debridement Performed for Assessment: Wound #5 Left,Proximal,Anterior Lower Leg Performed By: Physician Duanne Guess, MD Debridement Type: Debridement Level of Consciousness (Pre-procedure): Awake and Alert Pre-procedure Verification/Time Out Yes - 11:18 Taken: Start Time: 11:18 Pain Control: Lidocaine 4% T opical Solution Percent of Wound Bed Debrided: 50% T Area Debrided (cm): otal 14.72 Tissue and other material debrided: Non-Viable, Slough, Slough Level: Non-Viable Tissue Debridement Description: Selective/Open Wound Instrument: Curette Bleeding: Minimum Hemostasis Achieved: Pressure Response to Treatment: Procedure was tolerated well Level of Consciousness (Post- Awake and Alert procedure): Post Debridement Measurements of Total Wound Length: (cm) 7.5 Width: (cm) 5 Depth: (cm) 0.1 Volume: (cm) 2.945 Character of Wound/Ulcer Post Debridement: Improved Post Procedure Diagnosis Same as  Pre-procedure Notes scribed for Dr. Lady Gary by Samuella Bruin, RN Electronic Signature(s) Signed: 04/13/2023 12:44:54 PM By: Duanne Guess MD FACS Signed: 04/13/2023 3:30:01 PM By: Samuella Bruin Entered By: Samuella Bruin on 04/13/2023 11:21:50 -------------------------------------------------------------------------------- Debridement Details Patient Name: Date of Service: Elaine Hurst RMA Long. 04/13/2023 10:45 A M Medical Record Number: [id number] Patient Account Number: [id number] [name], [name] ([id number]) [id number] Page 3 of 13 Date of Birth/Sex: Treating RN: Feb 06, 1946 (77 y.o. [name] Primary Care Provider: Other Clinician: Dina Rich Referring Provider: Treating Provider/Extender: Pincus Badder in Treatment: 11 Debridement Performed for Assessment: Wound #4 Left,Anterior Lower Leg Performed By: Physician  Duanne Guess, MD Debridement Type: Debridement Severity of Tissue Pre Debridement: Fat layer exposed Level of Consciousness (Pre-procedure): Awake and Alert Pre-procedure Verification/Time Out Yes - 11:18 Taken: Start Time: 11:18 Pain Control: Lidocaine 4% T opical Solution Percent of Wound Bed Debrided: 20% T Area Debrided (cm): otal 19.78 Tissue and other material debrided: Non-Viable, Slough, Slough Level: Non-Viable Tissue Debridement Description: Selective/Open Wound Instrument: Curette Specimen: Tissue Culture Number of Specimens T aken: 1 Bleeding: Minimum Hemostasis Achieved: Pressure Response to Treatment: Procedure was tolerated well Level of Consciousness (Post- Awake and Alert procedure): Post Debridement Measurements of Total Wound Length: (cm) 12 Width: (cm) 10.5 Depth: (cm) 0.2 Volume: (cm) 19.792 Character of Wound/Ulcer Post Debridement: Improved Severity of Tissue Post Debridement: Fat layer exposed Post Procedure Diagnosis Same as Pre-procedure Notes scribed for  Dr. Lady Gary by Samuella Bruin, RN Electronic Signature(s) Signed: 04/13/2023 1:21:48 PM By: Duanne Guess MD FACS Signed: 04/13/2023 3:30:01 PM By: Samuella Bruin Previous Signature: 04/13/2023 12:44:54 PM Version By: Duanne Guess MD FACS Entered By: Samuella Bruin on 04/13/2023 12:48:34 -------------------------------------------------------------------------------- HPI Details Patient Name: Date of Service: Elaine Hurst RMA Long. 04/13/2023 10:45 A M Medical Record Number: [id number] Patient Account Number: [id number] Date of Birth/Sex: Treating RN: Dec 03, 1945 (77 y.o. F) Primary Care Provider: Dina Rich Other Clinician: Referring Provider: Treating Provider/Extender: Pincus Badder in Treatment: 11 History of Present Illness HPI Description: ADMISSION 02/19/2022 This is a 77 year old woman with minimal pertinent medical history. She does have COPD and pulmonary hypertension, but is not diabetic and is not a smoker. About 6 months ago, she and her husband got a new beagle puppy. The puppy scratched her leg and the scratches ultimately deteriorated into ulcerations. Apparently she sought care with a dermatologist who recommended applying a one-to-one mixture of peroxide and water to the wounds followed by a thick layer of Vaseline. She continue this for some time but then saw her primary care provider who told her to discontinue the peroxide. She has not been on any antibiotics for the scratches. Today, there are 3 separate wounds on her anterior tibial surface on the left. They are tender and her leg has localized swelling. There is yellow slough buildup in each of the wound bases. ABI in clinic today was normal at 1.16. 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 [name], [name] ([id number]) [id number] Page 4 of 13 02/25/2022: Over the past week, the wounds themselves have not improved tremendously but she  has noticed some stinging and the periwound skin is quite inflamed. I took a culture last week that had low levels of methicillin-resistant Staph aureus. Because it was fairly low level, I did not prescribe an oral antibiotic. I had planned to change her to mupirocin today, however. We have been using Santyl under Hydrofera Blue with 3 layer compression. 03/04/2022: The wounds have improved quite a bit over the past week. They are less painful and red. She has been taking the prescribed doxycycline but says that it gives her a stomachache. We have been using mupirocin with Hydrofera Blue and 3 layer compression. 03/11/2022: Continued improvement of the wounds. They have contracted quite a bit and have a good surface of healthy granulation tissue present. There is some dried eschar present around all 3 of the lesions. 03/17/2022: No significant change in the wounds from last week. The surface is clean with just a little bit of eschar and slough accumulation. No odor or drainage. No concern for infection. 03/26/2022: All of her wounds  are smaller this week. There is good granulation tissue on the surface of each. No slough accumulation. There is just some dry skin around the wounds but not actually involving the wounds. No concern for infection. 04/04/2022: The 2 proximal wounds have healed. The distal wound is much smaller and just has a little bit of dry eschar around the edges. 04/11/2022: Her wound is nearly healed. It is clean without concern for infection. 04/18/2022: Her wound has healed. READMISSION 01/26/2023 About 8 weeks ago, she sustained another scratch from her Beagle on her left anterior tibial surface.. She has been trying to treat the wound at home with Vaseline. There is thick slough buildup on the wound surface and the periwound skin looks a bit macerated. No overt sign of infection. 01/29/2023: She came in for an unscheduled visit today because her wound began bleeding. It apparently bled  quite profusely and her husband used an over-the- counter topical agent to get it to stop. On inspection today, he managed to achieve good hemostasis and there is no ongoing blood loss. 02/03/2023: No further issues with bleeding. There is minimal slough accumulation. Some hypertrophic granulation tissue present. 02/11/2023: She has had a lot more drainage over the past week and there has been more breakdown of the skin around her wound. The wound itself has expanded and there is quite a bit of slough on the surfaces. 02/18/2023: Her wound looks much better this week. The drainage has decreased and the periwound skin is in much improved condition. The wound is smaller and there is just 1 tiny satellite adjacent to the main wound. There is slough accumulation on the surfaces. The culture that I took last week returned with methicillin sensitive Staph aureus. She is currently taking Keflex for this. 02/25/2023: Her wound is smaller again this week with minimal slough accumulation. The periwound looks significantly improved. She has completed her oral Keflex. 03/04/2023: The wound continues to contract. The periwound is completely healed and looks like normal intact skin. There is some slough on the surface of the wound overlying good granulation tissue. 03/11/2023: The wound measured about the same size today, although visually it appears smaller. There is a little bit of slough on the surface with good granulation tissue underneath. 03/17/2023: The wound is a little bit smaller today with light slough on the wound surface. No erythema, induration, or significant drainage. 03/30/2023: The wound is a little bit smaller again today with minimal slough and eschar accumulation. 04/06/2023: Her skin irritation from the Zetuvit dressing has gotten worse and she has had a fair amount of skin breakdown in the distribution of the bandage. This is also caused some breakdown of the existing wound. In addition, when she was  at a wedding this weekend, she fell and suffered a large skin tear to her proximal left lower leg and left knee. The fat layer is exposed. There is slough accumulation on the surfaces. 04/13/2023: All of the wounds have deteriorated. They are red and angry-looking. There is slough on all of the surfaces. Electronic Signature(s) Signed: 04/13/2023 11:28:58 AM By: Duanne Guess MD FACS Entered By: Duanne Guess on 04/13/2023 11:28:58 -------------------------------------------------------------------------------- Physical Exam Details Patient Name: Date of Service: Elaine Hurst RMA Long. 04/13/2023 10:45 A M Medical Record Number: [id number] Patient Account Number: [id number] Date of Birth/Sex: Treating RN: [birth year] (77 y.o. F) Primary Care Provider: Dina Rich Other Clinician: Referring Provider: Treating Provider/Extender: Pincus Badder in Treatment: 11 Constitutional Slightly hypertensive. Slightly tachycardic. . . no acute  distress. Respiratory Normal work of breathing on room air. [name], [name] ([id number]) [id number] Page 5 of 13 Notes 04/13/2023: All of the wounds have deteriorated. They are red and angry-looking. There is slough on all of the surfaces. Electronic Signature(s) Signed: 04/13/2023 11:29:34 AM By: Duanne Guess MD FACS Entered By: Duanne Guess on 04/13/2023 11:29:34 -------------------------------------------------------------------------------- Physician Orders Details Patient Name: Date of Service: Elaine Hurst RMA Long. 04/13/2023 10:45 A M Medical Record Number: [id number] Patient Account Number: [id number] Date of Birth/Sex: Treating RN: 1946/04/05 (77 y.o. 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 - Dr. Lady Gary Room 2 Return A Anesthetic (In clinic) Topical Lidocaine 4% applied to wound bed Bathing/ Shower/ Hygiene May shower with protection but do not get wound dressing(s) wet. Protect dressing(s) with water repellant cover (for example, large plastic bag) or a cast cover and may then take shower. Edema Control - Lymphedema / SCD / Other Elevate legs to the level of the heart or above for 30 minutes daily and/or when sitting for 3-4 times a day throughout the day. A void standing for long periods of time. Exercise regularly If compression wraps slide down please call wound center and speak with a nurse. Wound Treatment Wound #4 - Lower Leg Wound Laterality: Left, Anterior Cleanser: Soap and Water 1 x Per Week/30 Days Discharge Instructions: May shower and wash wound with dial antibacterial soap and water prior to dressing change. Cleanser: Vashe 5.8 (oz) 1 x Per Week/30 Days Discharge Instructions: Cleanse the wound with Vashe prior to applying a clean dressing using gauze sponges, not tissue or cotton balls. Prim Dressing: Hydrofera Blue Ready Transfer Foam, 4x5 (in/in) 1 x Per Week/30 Days ary Discharge Instructions: Apply to wound bed as instructed Secondary Dressing: T Non-adherent Dressing, 2x3 in 1 x Per Week/30 Days elfa Discharge Instructions: Apply over primary dressing as directed. Secured With: Coban Self-Adherent Wrap 4x5 (in/yd) 1 x Per Week/30 Days Discharge Instructions: Secure with Coban as directed. Secured With: [employer], 4.5x3.1 (in/yd) 1 x Per Week/30 Days Discharge Instructions: Secure with Kerlix as directed. Wound #5 - Lower Leg Wound Laterality: Left,  Anterior, Proximal Cleanser: Soap and Water 1 x Per Week/30 Days Discharge Instructions: May shower and wash wound with dial antibacterial soap and water prior to dressing change. [name], [name] ([id number]) [id number] Page 6 of 13 Cleanser: Vashe 5.8 (oz) 1 x Per Week/30 Days Discharge Instructions: Cleanse the wound with Vashe prior to applying a clean dressing using gauze sponges, not tissue or cotton balls. Prim Dressing: Hydrofera Blue Ready Transfer Foam, 4x5 (in/in) 1 x Per Week/30 Days ary Discharge Instructions: Apply to wound bed as instructed Secondary Dressing: T Non-adherent Dressing, 2x3 in 1 x Per Week/30 Days elfa Discharge Instructions: Apply over primary dressing as directed. Secured With: Coban Self-Adherent Wrap 4x5 (in/yd) 1 x Per Week/30 Days Discharge Instructions: Secure with Coban as directed. Secured With: [employer], 4.5x3.1 (in/yd) 1 x Per Week/30 Days Discharge Instructions: Secure with Kerlix as directed. Wound #6 - Knee Wound Laterality: Left Cleanser: Soap and Water 1 x Per Day/30 Days Discharge Instructions: May shower and  wash wound with dial antibacterial soap and water prior to dressing change. Cleanser: Vashe 5.8 (oz) 1 x Per Day/30 Days Discharge Instructions: Cleanse the wound with Vashe prior to applying a clean dressing using gauze sponges, not tissue or cotton balls. Prim Dressing: Hydrofera Blue Ready Transfer Foam, 2.5x2.5 (in/in) 1 x Per Day/30 Days ary Discharge Instructions: Apply directly to wound bed as directed Secondary Dressing: T Non-adherent Dressing, 2x3 in 1 x Per Day/30 Days elfa Discharge Instructions: Apply over primary dressing as directed. Secured With: Elastic Bandage 4 inch (ACE bandage) 1 x Per Day/30 Days Discharge Instructions: Secure with ACE bandage as directed. Secured With: [employer], 4.5x3.1 (in/yd) 1 x Per Day/30 Days Discharge Instructions: Secure with Kerlix as  directed. Laboratory naerobe culture (MICRO) - PCR of nonhealing wound to left anterior lower leg - (ICD10 [id number] - Bacteria identified in Unspecified specimen by A Non-pressure chronic ulcer of other part of left lower leg with fat layer exposed) [name]: 635-3 Convenience Name: Anaerobic culture Patient Medications llergies: Sulfa (Sulfonamide Antibiotics), adhesive, amoxicillin, ciprofloxacin, Statins-Hmg-Coa Reductase Inhibitors A Notifications Medication Indication Start End 04/13/2023 lidocaine DOSE topical 4 % cream - cream topical 04/13/2023 cephalexin DOSE oral 500 mg capsule - 1 capsule p.o. 4 times daily x 10 days Electronic Signature(s) Signed: 04/13/2023 1:21:48 PM By: Duanne Guess MD FACS Signed: 04/13/2023 3:30:01 PM By: Samuella Bruin Previous Signature: 04/13/2023 12:44:54 PM Version By: Duanne Guess MD FACS Previous Signature: 04/13/2023 11:31:03 AM Version By: Duanne Guess MD FACS Entered By: Samuella Bruin on 04/13/2023 12:49:16 -------------------------------------------------------------------------------- Problem List Details Patient Name: Date of Service: Elaine Hurst RMA Long. 04/13/2023 10:45 A M Medical Record Number: [id number] Patient Account Number: [id number] Date of Birth/Sex: Treating RN: 1946-02-19 (77 y.o. 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 04/13/2023 10:45 A M Medical Record Number: [id number] Patient Account Number: [id number] Date of Birth/Sex: Treating RN: 1946-09-11 (77 y.o. F) Primary Care Provider: Dina Rich Other Clinician: Referring Provider: Treating Provider/Extender: Pincus Badder in Treatment: 11 Subjective Chief Complaint Information obtained from Patient Patient seen for complaints of Non-Healing Wound. History of Present Illness (HPI) ADMISSION 02/19/2022 This is a 77 year old woman with minimal pertinent medical history. She does have COPD and pulmonary hypertension, but is not diabetic and is not a smoker. About 6 months ago, she and her husband got a new beagle puppy. The puppy scratched her leg and the scratches ultimately deteriorated into ulcerations. Apparently she sought care with a dermatologist who recommended applying a one-to-one mixture of peroxide and water to the wounds followed by a thick layer of Vaseline. She continue this for some time but then saw her primary care provider who told her to discontinue the peroxide. She has not been on any antibiotics for the scratches. Today, there are 3 separate wounds on her anterior tibial surface on  the left. They are tender and her leg has localized swelling. There is yellow slough buildup in each of the wound bases. ABI in clinic today was normal at 1.16. 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 02/25/2022: Over the past week, the wounds themselves have not improved tremendously but she has noticed some stinging and the periwound skin is quite inflamed. I took a culture last week that had low  levels of methicillin-resistant Staph aureus. Because it was fairly low level, I did not prescribe an oral antibiotic. I had planned to change her to mupirocin today, however. We have been using Santyl under Hydrofera Blue with 3 layer compression. 03/04/2022: The wounds have improved quite a bit over the past week. They are less painful and red. She has been taking the prescribed doxycycline but says that it gives her a stomachache. We have been using mupirocin with Hydrofera Blue and 3 layer compression. 03/11/2022: Continued improvement of the wounds. They have contracted quite a bit and have a good surface of healthy granulation tissue present. There is some dried eschar present around all 3 of the lesions. 03/17/2022: No significant change in the wounds from last week. The surface is clean with just a little bit of eschar and slough accumulation. No odor or [name], [name] ([id number]) [id number] Page 8 of 13 drainage. No concern for infection. 03/26/2022: All of her wounds are smaller this week. There is good granulation tissue on the surface of each. No slough accumulation. There is just some dry skin around the wounds but not actually involving the wounds. No concern for infection. 04/04/2022: The 2 proximal wounds have healed. The distal wound is much smaller and just has a little bit of dry eschar around the edges. 04/11/2022: Her wound is nearly healed. It is clean without concern for infection. 04/18/2022: Her wound has healed. READMISSION 01/26/2023 About 8 weeks ago, she sustained another scratch from her Beagle on her left anterior tibial surface.. She has been trying to treat the wound at home with Vaseline. There is thick slough buildup on the wound surface and the periwound skin looks a bit macerated. No overt sign of infection. 01/29/2023: She came in for an unscheduled visit today because her wound began bleeding. It apparently bled quite profusely and her husband  used an over-the- counter topical agent to get it to stop. On inspection today, he managed to achieve good hemostasis and there is no ongoing blood loss. 02/03/2023: No further issues with bleeding. There is minimal slough accumulation. Some hypertrophic granulation tissue present. 02/11/2023: She has had a lot more drainage over the past week and there has been more breakdown of the skin around her wound. The wound itself has expanded and there is quite a bit of slough on the surfaces. 02/18/2023: Her wound looks much better this week. The drainage has decreased and the periwound skin is in much improved condition. The wound is smaller and there is just 1 tiny satellite adjacent to the main wound. There is slough accumulation on the surfaces. The culture that I took last week returned with methicillin sensitive Staph aureus. She is currently taking Keflex for this. 02/25/2023: Her wound is smaller again this week with minimal slough accumulation. The periwound looks significantly improved. She has completed her oral Keflex. 03/04/2023: The wound continues to contract. The periwound is completely healed and looks like normal intact skin. There is some slough on the surface of the wound overlying good granulation tissue. 03/11/2023: The wound measured  about the same size today, although visually it appears smaller. There is a little bit of slough on the surface with good granulation tissue underneath. 03/17/2023: The wound is a little bit smaller today with light slough on the wound surface. No erythema, induration, or significant drainage. 03/30/2023: The wound is a little bit smaller again today with minimal slough and eschar accumulation. 04/06/2023: Her skin irritation from the Zetuvit dressing has gotten worse and she has had a fair amount of skin breakdown in the distribution of the bandage. This is also caused some breakdown of the existing wound. In addition, when she was at a wedding this weekend, she  fell and suffered a large skin tear to her proximal left lower leg and left knee. The fat layer is exposed. There is slough accumulation on the surfaces. 04/13/2023: All of the wounds have deteriorated. They are red and angry-looking. There is slough on all of the surfaces. Patient History Information obtained from Patient, Caregiver. Family History Unknown History. Social History Former smoker, Marital Status - Married, Alcohol Use - Never, Drug Use - No History, Caffeine Use - Rarely. Medical History Eyes Patient has history of Cataracts - bil removed Denies history of Glaucoma, Optic Neuritis Ear/Nose/Mouth/Throat Denies history of Chronic sinus problems/congestion, Middle ear problems Hematologic/Lymphatic Patient has history of Anemia Respiratory Patient has history of Chronic Obstructive Pulmonary Disease (COPD) Cardiovascular Patient has history of Coronary Artery Disease, Peripheral Venous Disease - varicose veins Endocrine Denies history of Type I Diabetes, Type II Diabetes Genitourinary Denies history of End Stage Renal Disease Immunological Patient has history of Raynauds Denies history of Lupus Erythematosus, Scleroderma Integumentary (Skin) Denies history of History of Burn Musculoskeletal Patient has history of Rheumatoid Arthritis, Osteoarthritis Denies history of Gout Oncologic Denies history of Received Chemotherapy, Received Radiation Psychiatric Denies history of Anorexia/bulimia, Confinement Anxiety Hospitalization/Surgery History - bil cataract removal. - left shoiulder replacement. - right rotator cuff repair. - multiple lumbar spine surgeries. - melanoma removed left arm and chest. - partial excision bone left 2nd toe. Medical A Surgical History Notes nd Hematologic/Lymphatic varicose vein of leg, mixed hyperlipidemia [name], [name] ([id number]) [id number] Page 9 of 13 Respiratory dyspnea, chronic bronchitis, pulmonary  hypertension, pulmonary emphysema Cardiovascular aortic calcification, tachycardia, left ventricular dysfunction, bradycardia Gastrointestinal Gerd Endocrine prediabetes, hypothyroidism Genitourinary bladder disorder Integumentary (Skin) contact dermatitis Musculoskeletal acquired plantar porokeratosis, neurogenic claudication d/t lumbar spinal stenosis, supraspinatus tendinitis, restless leg syndrome, spinal stenosis of lumbar region Neurologic TIA, insomnia Objective Constitutional Slightly hypertensive. Slightly tachycardic. no acute distress. Vitals Time Taken: 10:52 AM, Height: 63 in, Weight: 115 lbs, BMI: 20.4, Temperature: 98.4 F, Pulse: 109 bpm, Respiratory Rate: 16 breaths/min, Blood Pressure: 141/59 mmHg. Respiratory Normal work of breathing on room air. General Notes: 04/13/2023: All of the wounds have deteriorated. They are red and angry-looking. There is slough on all of the surfaces. Integumentary (Hair, Skin) Wound #4 status is Open. Original cause of wound was Trauma. The date acquired was: 11/27/2022. The wound has been in treatment 11 weeks. The wound is located on the Left,Anterior Lower Leg. The wound measures 12cm length x 10.5cm width x 0.2cm depth; 98.96cm^2 area and 19.792cm^3 volume. There is Fat Layer (Subcutaneous Tissue) exposed. There is no tunneling or undermining noted. There is a large amount of serosanguineous drainage noted. The wound margin is distinct with the outline attached to the wound base. There is medium (34-66%) red, pink granulation within the wound bed. There is a medium (34-66%) amount of necrotic tissue within the wound bed  including Eschar and Adherent Slough. The periwound skin appearance had no abnormalities noted for moisture. The periwound skin appearance exhibited: Excoriation, Rash, Hemosiderin Staining. The periwound skin appearance did not exhibit: Callus, Crepitus, Induration, Scarring, Atrophie Blanche, Cyanosis, Ecchymosis,  Mottled, Pallor, Rubor, Erythema. Periwound temperature was noted as No Abnormality. Wound #5 status is Open. Original cause of wound was Skin T ear/Laceration. The date acquired was: 04/02/2023. The wound has been in treatment 1 weeks. The wound is located on the Left,Proximal,Anterior Lower Leg. The wound measures 7.5cm length x 5cm width x 0.1cm depth; 29.452cm^2 area and 2.945cm^3 volume. There is Fat Layer (Subcutaneous Tissue) exposed. There is no tunneling or undermining noted. There is a medium amount of serosanguineous drainage noted. The wound margin is distinct with the outline attached to the wound base. There is large (67-100%) red granulation within the wound bed. There is a small (1-33%) amount of necrotic tissue within the wound bed including Adherent Slough. The periwound skin appearance had no abnormalities noted for texture. The periwound skin appearance had no abnormalities noted for moisture. The periwound skin appearance exhibited: Rubor. The periwound skin appearance did not exhibit: Ecchymosis. Periwound temperature was noted as No Abnormality. Wound #6 status is Open. Original cause of wound was Skin T ear/Laceration. The date acquired was: 04/02/2023. The wound has been in treatment 1 weeks. The wound is located on the Left Knee. The wound measures 2cm length x 3cm width x 0.1cm depth; 4.712cm^2 area and 0.471cm^3 volume. There is Fat Layer (Subcutaneous Tissue) exposed. There is no tunneling or undermining noted. There is a medium amount of serosanguineous drainage noted. The wound margin is distinct with the outline attached to the wound base. There is large (67-100%) red granulation within the wound bed. There is a small (1-33%) amount of necrotic tissue within the wound bed including Adherent Slough. The periwound skin appearance had no abnormalities noted for moisture. The periwound skin appearance exhibited: Scarring, Ecchymosis. Periwound temperature was noted as No  Abnormality. 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 [name], STUTZMAN ([id number]) [id number] Page 10 of 13 There was a Selective/Open Wound Non-Viable Tissue Debridement with a total area of 19.78 sq cm performed by Duanne Guess, MD. With the following instrument(s): Curette to remove Non-Viable tissue/material. Material removed includes [facility] after achieving pain control using Lidocaine 4% T opical Solution. 1 specimen was taken by a Tissue Culture and sent to the lab per facility protocol. A time out was conducted at 11:18, prior to the start of the procedure. A Minimum amount of bleeding was controlled with Pressure. The procedure was tolerated well. Post Debridement Measurements: 12cm length x 10.5cm width x 0.2cm depth; 19.792cm^3 volume. Character of Wound/Ulcer Post Debridement is improved. Severity of Tissue Post Debridement is: Fat layer exposed. Post procedure Diagnosis Wound #4: Same as Pre-Procedure General Notes: scribed for Dr. Lady Gary by Samuella Bruin, RN. Wound #5 Pre-procedure diagnosis of Wound #5 is a Skin T located on the Left,Proximal,Anterior Lower Leg . There was a Selective/Open Wound Non-Viable Tissue ear Debridement with a total area of 14.72 sq cm performed by Duanne Guess, MD. With the following instrument(s): Curette to remove Non-Viable tissue/material. Material removed includes  Slough after achieving pain control using Lidocaine 4% Topical Solution. No specimens were taken. A time out was conducted at 11:18, prior to the start of the procedure. A  Minimum amount of bleeding was controlled with Pressure. The procedure was tolerated well. Post Debridement Measurements: 7.5cm length x 5cm width x 0.1cm depth; 2.945cm^3 volume. Character of Wound/Ulcer Post Debridement is improved. Post procedure Diagnosis Wound #5: Same as Pre-Procedure General Notes: scribed for Dr. Lady Gary by Samuella Bruin, RN. Wound #6 Pre-procedure diagnosis of Wound #6 is a Skin T located on the Left Knee . There was a Selective/Open Wound Non-Viable Tissue Debridement with a total ear area of 4.71 sq cm performed by Duanne Guess, MD. With the following instrument(s): Curette to remove Non-Viable tissue/material. Material removed includes [facility] after achieving pain control using Lidocaine 4% Topical Solution. No specimens were taken. A time out was conducted at 11:18, prior to the start of the procedure. A Minimum amount of bleeding was controlled with Pressure. The procedure was tolerated well. Post Debridement Measurements: 2cm length x 3cm width x 0.1cm depth; 0.471cm^3 volume. Character of Wound/Ulcer Post Debridement is improved. Post procedure Diagnosis Wound #6: Same as Pre-Procedure General Notes: scribed for Dr. Lady Gary by Samuella Bruin, RN. Plan Follow-up Appointments: Return Appointment in 1 week. - Dr. Lady Gary Room 2 Anesthetic: (In clinic) Topical Lidocaine 4% applied to wound bed Bathing/ Shower/ Hygiene: May shower with protection but do not get wound dressing(s) wet. Protect dressing(s) with water repellant cover (for example, large plastic bag) or a cast cover and may then take shower. Edema Control - Lymphedema / SCD / Other: Elevate legs to the level of the heart or above for 30 minutes daily and/or when sitting for 3-4 times a day throughout the day. Avoid standing for long periods of time. Exercise regularly If compression wraps slide down please call wound center and speak with a nurse. Laboratory ordered were: Anaerobic culture -  PCR of nonhealing wound to left anterior lower leg The following medication(s) was prescribed: lidocaine topical 4 % cream cream topical was prescribed at facility cephalexin oral 500 mg capsule 1 capsule p.o. 4 times daily x 10 days starting 04/13/2023 WOUND #4: - Lower Leg Wound Laterality: Left, Anterior Cleanser: Soap and Water 1 x Per Week/30 Days Discharge Instructions: May shower and wash wound with dial antibacterial soap and water prior to dressing change. Cleanser: Vashe 5.8 (oz) 1 x Per Week/30 Days Discharge Instructions: Cleanse the wound with Vashe prior to applying a clean dressing using gauze sponges, not tissue or cotton balls. Prim Dressing: Hydrofera Blue Ready Transfer Foam, 4x5 (in/in) 1 x Per Week/30 Days ary Discharge Instructions: Apply to wound bed as instructed Secondary Dressing: T Non-adherent Dressing, 2x3 in 1 x Per Week/30 Days elfa Discharge Instructions: Apply over primary dressing as directed. Secured With: Coban Self-Adherent Wrap 4x5 (in/yd) 1 x Per Week/30 Days Discharge Instructions: Secure with Coban as directed. Secured With: [employer], 4.5x3.1 (in/yd) 1 x Per Week/30 Days Discharge Instructions: Secure with Kerlix as directed. WOUND #5: - Lower Leg Wound Laterality: Left, Anterior, Proximal Cleanser: Soap and Water 1 x Per Week/30 Days Discharge Instructions: May shower and wash wound with dial antibacterial soap and water prior to dressing change. Cleanser: Vashe 5.8 (oz) 1 x Per Week/30 Days Discharge Instructions: Cleanse the wound with Vashe prior to applying a clean dressing using gauze sponges, not tissue or cotton balls. Prim Dressing: Hydrofera Blue Ready Transfer Foam, 4x5 (in/in) 1 x Per Week/30 Days  ary Discharge Instructions: Apply to wound bed as instructed Secondary Dressing: T Non-adherent Dressing, 2x3 in 1 x Per Week/30 Days elfa Discharge Instructions: Apply over primary dressing as directed. Secured With: Coban  Self-Adherent Wrap 4x5 (in/yd) 1 x Per Week/30 Days Discharge Instructions: Secure with Coban as directed. Secured With: [employer], 4.5x3.1 (in/yd) 1 x Per Week/30 Days Discharge Instructions: Secure with Kerlix as directed. WOUND #6: - Knee Wound Laterality: Left Cleanser: Soap and Water 1 x Per Day/30 Days Discharge Instructions: May shower and wash wound with dial antibacterial soap and water prior to dressing change. Cleanser: Vashe 5.8 (oz) 1 x Per Day/30 Days Discharge Instructions: Cleanse the wound with Vashe prior to applying a clean dressing using gauze sponges, not tissue or cotton balls. Prim Dressing: Hydrofera Blue Ready Transfer Foam, 2.5x2.5 (in/in) 1 x Per Day/30 Days ary Discharge Instructions: Apply directly to wound bed as directed Secondary Dressing: T Non-adherent Dressing, 2x3 in 1 x Per Day/30 Days elfa Discharge Instructions: Apply over primary dressing as directed. Secured With: Elastic Bandage 4 inch (ACE bandage) 1 x Per Day/30 Days Discharge Instructions: Secure with ACE bandage as directed. Secured With: [employer], 4.5x3.1 (in/yd) 1 x Per Day/30 Days Discharge Instructions: Secure with Kerlix as directed. XIOMARA, STRAKA ([id number]) [id number] Page 11 of 13 04/13/2023: All of the wounds have deteriorated. They are red and angry-looking. There is slough on all of the surfaces. I used a curette to debride slough off of all of the wound surfaces. I then took a culture, as I am concerned she may have an infection contributing to the deterioration of her wounds. I have empirically prescribed Keflex as that is what her culture was sensitive to the last time. I have changed her contact layer to [facility], in case this represents a reaction to the silver alginate. We are going to use cotton and Coban as her compression. Follow-up in 1 week. Electronic Signature(s) Signed: 04/13/2023 1:21:48 PM By: Duanne Guess MD  FACS Signed: 04/13/2023 3:30:01 PM By: Samuella Bruin Previous Signature: 04/13/2023 11:52:12 AM Version By: Duanne Guess MD FACS Previous Signature: 04/13/2023 11:33:52 AM Version By: Duanne Guess MD FACS Entered By: Samuella Bruin on 04/13/2023 12:49:32 -------------------------------------------------------------------------------- HxROS Details Patient Name: Date of Service: Elaine Hurst RMA Long. 04/13/2023 10:45 A M Medical Record Number: [id number] Patient Account Number: [id number] Date of Birth/Sex: Treating RN: 1946-07-03 (77 y.o. F) Primary Care Provider: Dina Rich Other Clinician: Referring Provider: Treating Provider/Extender: Pincus Badder in Treatment: 11 Information Obtained From Patient Caregiver Eyes Medical History: Positive for: Cataracts - bil removed Negative for: Glaucoma; Optic Neuritis Ear/Nose/Mouth/Throat Medical History: Negative for: Chronic sinus problems/congestion; Middle ear problems Hematologic/Lymphatic Medical History: Positive for: Anemia Past Medical History Notes: varicose vein of leg, mixed hyperlipidemia Respiratory Medical History: Positive for: Chronic Obstructive Pulmonary Disease (COPD) Past Medical History Notes: dyspnea, chronic bronchitis, pulmonary hypertension, pulmonary emphysema Cardiovascular Medical History: Positive for: Coronary Artery Disease; Peripheral Venous Disease - varicose veins Past Medical History Notes: aortic calcification, tachycardia, left ventricular dysfunction, bradycardia Gastrointestinal Medical History: Past Medical History Notes: [facility] Endocrine Medical HistoryLISA-MARIE, KIMES ([id number]) [id number] Page 12 of 13 Negative for: Type I Diabetes; Type II Diabetes Past Medical History Notes: prediabetes, hypothyroidism Genitourinary Medical History: Negative for: End Stage Renal Disease Past Medical History Notes: bladder  disorder Immunological Medical History: Positive for: Raynauds Negative for: Lupus Erythematosus; Scleroderma Integumentary (Skin) Medical History: Negative for: History of Burn Past Medical History Notes: contact dermatitis  Musculoskeletal Medical History: Positive for: Rheumatoid Arthritis; Osteoarthritis Negative for: Gout Past Medical History Notes: acquired plantar porokeratosis, neurogenic claudication d/t lumbar spinal stenosis, supraspinatus tendinitis, restless leg syndrome, spinal stenosis of lumbar region Neurologic Medical History: Past Medical History Notes: TIA, insomnia Oncologic Medical History: Negative for: Received Chemotherapy; Received Radiation Psychiatric Medical History: Negative for: Anorexia/bulimia; Confinement Anxiety HBO Extended History Items Eyes: Cataracts Immunizations Pneumococcal Vaccine: Received Pneumococcal Vaccination: Yes Received Pneumococcal Vaccination On or After 60th Birthday: Yes Implantable Devices None Hospitalization / Surgery History Type of Hospitalization/Surgery bil cataract removal left shoiulder replacement right rotator cuff repair multiple lumbar spine surgeries melanoma removed left arm and chest partial excision bone left 2nd toe Family and Social History Unknown History: Yes; Former smoker; Marital Status - Married; Alcohol Use: Never; Drug Use: No History; Caffeine Use: Rarely; Financial Concerns: No; Food, Clothing or Shelter Needs: No; Support System Lacking: No; Transportation Concerns: No Electronic Signature(s) Signed: 04/13/2023 12:44:54 PM By: Duanne Guess MD FACS Dorgan, Amelita Long 04/13/2023 12:44:54 PM By: Duanne Guess MD FACS Signed: ([id number]) [id number] Page 13 of 13 Entered By: Duanne Guess on 04/13/2023 11:29:11 -------------------------------------------------------------------------------- SuperBill Details Patient Name: Date of Service: NEYAH, MOTZ RMA  Long. 04/13/2023 Medical Record Number: [id number] Patient Account Number: [id number] Date of Birth/Sex: Treating RN: 1946-07-03 (77 y.o. F) Primary Care Provider: Dina Rich Other Clinician: Referring Provider: Treating Provider/Extender: Pincus Badder in Treatment: 11 Diagnosis Coding ICD-10 Codes Long Description [phone] Non-pressure chronic ulcer of other part of left lower leg with fat layer exposed L97.122 Non-pressure chronic ulcer of left thigh with fat layer exposed I27.20 Pulmonary hypertension, unspecified I51.9 Heart disease, unspecified Facility Procedures : CPT4 Long: [id number] Description: 97597 - DEBRIDE WOUND 1ST 20 SQ CM OR < ICD-10 Diagnosis Description L97.822 Non-pressure chronic ulcer of other part of left lower leg with fat layer exposed L97.122 Non-pressure chronic ulcer of left thigh with fat layer exposed Modifier: Quantity: 1 : CPT4 Long: [id number] Description: 97598 - DEBRIDE WOUND EA ADDL 20 SQ CM ICD-10 Diagnosis Description L97.822 Non-pressure chronic ulcer of other part of left lower leg with fat layer exposed L97.122 Non-pressure chronic ulcer of left thigh with fat layer exposed Modifier: Quantity: 1 Physician Procedures : CPT4 Long Description Modifier [id number] - WC PHYS LEVEL 4 - EST PT 25 ICD-10 Diagnosis Description L97.822 Non-pressure chronic ulcer of other part of left lower leg with fat layer exposed L97.122 Non-pressure chronic ulcer of left thigh with fat  layer exposed I27.20 Pulmonary hypertension, unspecified I51.9 Heart disease, unspecified Quantity: 1 : [id number] 97597 - WC PHYS DEBR WO ANESTH 20 SQ CM ICD-10 Diagnosis Description L97.822 Non-pressure chronic ulcer of other part of left lower leg with fat layer exposed L97.122 Non-pressure chronic ulcer of left thigh with fat layer exposed Quantity: 1 : [id number] 97598 - WC PHYS DEBR WO ANESTH EA ADD 20 CM ICD-10 Diagnosis Description L97.822 Non-pressure chronic  ulcer of other part of left lower leg with fat layer exposed L97.122 Non-pressure chronic ulcer of left thigh with fat layer exposed Quantity: 1 Electronic Signature(s) Signed: 04/13/2023 11:52:34 AM By: Duanne Guess MD FACS Entered By: Duanne Guess on 04/13/2023 11:52:34

## 2023-04-20 ENCOUNTER — Encounter (HOSPITAL_BASED_OUTPATIENT_CLINIC_OR_DEPARTMENT_OTHER): Payer: PPO | Admitting: General Surgery

## 2023-04-20 DIAGNOSIS — L97822 Non-pressure chronic ulcer of other part of left lower leg with fat layer exposed: Secondary | ICD-10-CM | POA: Diagnosis not present

## 2023-04-20 NOTE — Progress Notes (Addendum)
AMIKA, TASSIN ([id number]) [id number] Page 1 of 13 Visit Report for 04/20/2023 Chief Complaint Document Details Patient Name: Date of Service: [name], Elaine [name] 04/20/2023 1:45 PM Medical Record Number: [id number] Patient Account Number: [id number] Date of Birth/Sex: Treating RN: [birth year] (77 y.o. F) Primary Care Provider: Dina Rich Other Clinician: Referring Provider: Treating Provider/Extender: Pincus Badder in Treatment: 12 Information Obtained from: Patient Chief Complaint Patient seen for complaints of Non-Healing Wound. Electronic Signature(s) Signed: 04/20/2023 2:23:17 PM By: Duanne Guess MD FACS Entered By: Duanne Guess on 04/20/2023 14:23:17 -------------------------------------------------------------------------------- Debridement Details Patient Name: Date of Service: [name] [name] 04/20/2023 1:45 PM Medical Record Number: [id number] Patient Account Number: [id number] Date of Birth/Sex: Treating RN: 1946-05-06 (77 y.o. Katrinka Blazing Primary Care Provider: Dina Rich Other Clinician: Referring Provider: Treating Provider/Extender: Pincus Badder in Treatment: 12 Debridement Performed for Assessment: Wound #4 Left,Anterior Lower Leg Performed By: Physician Duanne Guess, MD Debridement Type: Debridement Severity of Tissue Pre Debridement: Fat layer exposed Level of Consciousness (Pre-procedure): Awake and Alert Pre-procedure Verification/Time Out Yes - 14:10 Taken: Start Time: 14:10 Pain Control: Lidocaine 4% Topical Solution Percent of Wound Bed Debrided: 100% T Area Debrided (cm): otal 89.49 Tissue and other material debrided: Non-Viable, Eschar, Slough, Slough Level: Non-Viable Tissue Debridement Description: Selective/Open Wound Instrument: Curette Bleeding: Minimum Hemostasis Achieved: Pressure End Time: 14:12 Procedural Pain: 0 Post Procedural Pain: 0 Response  to Treatment: Procedure was tolerated well Level of Consciousness (Post- Awake and Alert procedure): Post Debridement Measurements of Total Wound Length: (cm) 12 Width: (cm) 9.5 Depth: (cm) 0.1 Volume: (cm) 8.954 Character of Wound/Ulcer Post Debridement: Improved AZALYNN, MAXIM ([id number]) [id number] Page 2 of 13 Severity of Tissue Post Debridement: Fat layer exposed Post Procedure Diagnosis Same as Pre-procedure Notes Scribed for Dr. Lady Gary by J.Scotton Electronic Signature(s) Signed: 04/20/2023 4:31:09 PM By: Duanne Guess MD FACS Signed: 04/20/2023 5:35:35 PM By: Karie Schwalbe RN Entered By: Karie Schwalbe on 04/20/2023 14:26:22 -------------------------------------------------------------------------------- Debridement Details Patient Name: Date of Service: [name] [name] 04/20/2023 1:45 PM Medical Record Number: [id number] Patient Account Number: [id number] Date of Birth/Sex: Treating RN: 02-15-1946 (77 y.o. Katrinka Blazing Primary Care Provider: Dina Rich Other Clinician: Referring Provider: Treating Provider/Extender: Pincus Badder in Treatment: 12 Debridement Performed for Assessment: Wound #5 Left,Proximal,Anterior Lower Leg Performed By: Physician Duanne Guess, MD Debridement Type: Debridement Level of Consciousness (Pre-procedure): Awake and Alert Pre-procedure Verification/Time Out Yes - 14:10 Taken: Start Time: 14:10 Pain Control: Lidocaine 4% Topical Solution Percent of Wound Bed Debrided: 100% T Area Debrided (cm): otal 12.76 Tissue and other material debrided: Non-Viable, Eschar, Slough, Slough Level: Non-Viable Tissue Debridement Description: Selective/Open Wound Instrument: Curette Bleeding: Minimum Hemostasis Achieved: Pressure End Time: 14:12 Procedural Pain: 0 Post Procedural Pain: 0 Response to Treatment: Procedure was tolerated well Level of Consciousness (Post- Awake and  Alert procedure): Post Debridement Measurements of Total Wound Length: (cm) 6.5 Width: (cm) 2.5 Depth: (cm) 0.1 Volume: (cm) 1.276 Character of Wound/Ulcer Post Debridement: Improved Post Procedure Diagnosis Same as Pre-procedure Notes Scribed for Dr. Lady Gary by J.Scotton Electronic Signature(s) Signed: 04/20/2023 4:31:09 PM By: Duanne Guess MD FACS Signed: 04/20/2023 5:35:35 PM By: Karie Schwalbe RN Entered By: Karie Schwalbe on 04/20/2023 14:26:31 Swickard, Chyler K ([id number]) [id number] Page 3 of 13 -------------------------------------------------------------------------------- Debridement Details Patient Name: Date of Service: [name] [name] 04/20/2023 1:45 PM Medical Record Number: [id number] Patient Account Number: [id number] Date of Birth/Sex: Treating RN: 04/20/1946 (77 y.o. F) Scotton, Randa Evens  Primary Care Provider: Dina Rich Other Clinician: Referring Provider: Treating Provider/Extender: Pincus Badder in Treatment: 12 Debridement Performed for Assessment: Wound #6 Left Knee Performed By: Physician Duanne Guess, MD Debridement Type: Debridement Level of Consciousness (Pre-procedure): Awake and Alert Pre-procedure Verification/Time Out Yes - 14:10 Taken: Start Time: 14:10 Pain Control: Lidocaine 4% Topical Solution Percent of Wound Bed Debrided: 100% T Area Debrided (cm): otal 2.94 Tissue and other material debrided: Non-Viable, Eschar, Slough, Slough Level: Non-Viable Tissue Debridement Description: Selective/Open Wound Instrument: Curette Bleeding: Minimum Hemostasis Achieved: Pressure End Time: 14:12 Procedural Pain: 0 Post Procedural Pain: 0 Response to Treatment: Procedure was tolerated well Level of Consciousness (Post- Awake and Alert procedure): Post Debridement Measurements of Total Wound Length: (cm) 1.5 Width: (cm) 2.5 Depth: (cm) 0.1 Volume: (cm) 0.295 Character of Wound/Ulcer Post  Debridement: Improved Post Procedure Diagnosis Same as Pre-procedure Notes Scribed for Dr. Lady Gary by J.Scotton Electronic Signature(s) Signed: 04/20/2023 4:31:09 PM By: Duanne Guess MD FACS Signed: 04/20/2023 5:35:35 PM By: Karie Schwalbe RN Entered By: Karie Schwalbe on 04/20/2023 14:26:44 -------------------------------------------------------------------------------- HPI Details Patient Name: Date of Service: [name] [name] 04/20/2023 1:45 PM Medical Record Number: [id number] Patient Account Number: [id number] Date of Birth/Sex: Treating RN: 1946-09-08 (77 y.o. F) Primary Care Provider: Dina Rich Other Clinician: Referring Provider: Treating Provider/Extender: Pincus Badder in Treatment: 12 History of Present Illness LYNSEY, ANGE ([id number]) [id number] Page 4 of 13 HPI Description: ADMISSION 02/19/2022 This is a 77 year old woman with minimal pertinent medical history. She does have COPD and pulmonary hypertension, but is not diabetic and is not a smoker. About 6 months ago, she and her husband got a new beagle puppy. The puppy scratched her leg and the scratches ultimately deteriorated into ulcerations. Apparently she sought care with a dermatologist who recommended applying a one-to-one mixture of peroxide and water to the wounds followed by a thick layer of Vaseline. She continue this for some time but then saw her primary care provider who told her to discontinue the peroxide. She has not been on any antibiotics for the scratches. Today, there are 3 separate wounds on her anterior tibial surface on the left. They are tender and her leg has localized swelling. There is yellow slough buildup in each of the wound bases. ABI in clinic today was normal at 1.16. 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 02/25/2022: Over the past week, the wounds themselves have not improved tremendously but she has  noticed some stinging and the periwound skin is quite inflamed. I took a culture last week that had low levels of methicillin-resistant Staph aureus. Because it was fairly low level, I did not prescribe an oral antibiotic. I had planned to change her to mupirocin today, however. We have been using Santyl under Hydrofera Blue with 3 layer compression. 03/04/2022: The wounds have improved quite a bit over the past week. They are less painful and red. She has been taking the prescribed doxycycline but says that it gives her a stomachache. We have been using mupirocin with Hydrofera Blue and 3 layer compression. 03/11/2022: Continued improvement of the wounds. They have contracted quite a bit and have a good surface of healthy granulation tissue present. There is some dried eschar present around all 3 of the lesions. 03/17/2022: No significant change in the wounds from last week. The surface is clean with just a little bit of eschar and slough accumulation. No odor or drainage. No concern for infection. 03/26/2022: All of  her wounds are smaller this week. There is good granulation tissue on the surface of each. No slough accumulation. There is just some dry skin around the wounds but not actually involving the wounds. No concern for infection. 04/04/2022: The 2 proximal wounds have healed. The distal wound is much smaller and just has a little bit of dry eschar around the edges. 04/11/2022: Her wound is nearly healed. It is clean without concern for infection. 04/18/2022: Her wound has healed. READMISSION 01/26/2023 About 8 weeks ago, she sustained another scratch from her Beagle on her left anterior tibial surface.. She has been trying to treat the wound at home with Vaseline. There is thick slough buildup on the wound surface and the periwound skin looks a bit macerated. No overt sign of infection. 01/29/2023: She came in for an unscheduled visit today because her wound began bleeding. It apparently bled quite  profusely and her husband used an over-the- counter topical agent to get it to stop. On inspection today, he managed to achieve good hemostasis and there is no ongoing blood loss. 02/03/2023: No further issues with bleeding. There is minimal slough accumulation. Some hypertrophic granulation tissue present. 02/11/2023: She has had a lot more drainage over the past week and there has been more breakdown of the skin around her wound. The wound itself has expanded and there is quite a bit of slough on the surfaces. 02/18/2023: Her wound looks much better this week. The drainage has decreased and the periwound skin is in much improved condition. The wound is smaller and there is just 1 tiny satellite adjacent to the main wound. There is slough accumulation on the surfaces. The culture that I took last week returned with methicillin sensitive Staph aureus. She is currently taking Keflex for this. 02/25/2023: Her wound is smaller again this week with minimal slough accumulation. The periwound looks significantly improved. She has completed her oral Keflex. 03/04/2023: The wound continues to contract. The periwound is completely healed and looks like normal intact skin. There is some slough on the surface of the wound overlying good granulation tissue. 03/11/2023: The wound measured about the same size today, although visually it appears smaller. There is a little bit of slough on the surface with good granulation tissue underneath. 03/17/2023: The wound is a little bit smaller today with light slough on the wound surface. No erythema, induration, or significant drainage. 03/30/2023: The wound is a little bit smaller again today with minimal slough and eschar accumulation. 04/06/2023: Her skin irritation from the Zetuvit dressing has gotten worse and she has had a fair amount of skin breakdown in the distribution of the bandage. This is also caused some breakdown of the existing wound. In addition, when she was at a  wedding this weekend, she fell and suffered a large skin tear to her proximal left lower leg and left knee. The fat layer is exposed. There is slough accumulation on the surfaces. 04/13/2023: All of the wounds have deteriorated. They are red and angry-looking. There is slough on all of the surfaces. 04/20/2023: There has been significant improvement since last week. The erythema and irritation have resolved. There is minimal slough on the wound surfaces along with a little bit of light periwound eschar. She continues on Keflex. Electronic Signature(s) Signed: 04/20/2023 2:24:03 PM By: Duanne Guess MD FACS Entered By: Duanne Guess on 04/20/2023 14:24:03 -------------------------------------------------------------------------------- Physical Exam Details Patient Name: Date of Service: [name] [name] 04/20/2023 1:45 PM Medical Record Number: [id number] Patient Account Number: [id number]  LORILYN, LAITINEN ([id number]) [id number] Page 5 of 13 Date of Birth/Sex: Treating RN: 02-May-1946 (77 y.o. F) Primary Care Provider: Other Clinician: Dina Rich Referring Provider: Treating Provider/Extender: Pincus Badder in Treatment: 12 Constitutional . . . . no acute distress. Respiratory Normal work of breathing on room air. Notes 04/20/2023: There has been significant improvement since last week. The erythema and irritation have resolved. There is minimal slough on the wound surfaces along with a little bit of light periwound eschar. Electronic Signature(s) Signed: 04/20/2023 2:26:29 PM By: Duanne Guess MD FACS Entered By: Duanne Guess on 04/20/2023 14:26:28 -------------------------------------------------------------------------------- Physician Orders Details Patient Name: Date of Service: [name] [name] 04/20/2023 1:45 PM Medical Record Number: [id number] Patient Account Number: [id number] Date of Birth/Sex: Treating RN: 03/14/1946  (77 y.o. 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 - Dr. Lady Gary Room 2 Return A July 1st 10am July 8th 10:15 am (Room 3) Anesthetic (In clinic) Topical Lidocaine 4% applied to wound bed Bathing/ Shower/ Hygiene May shower with protection but do not get wound dressing(s) wet. Protect dressing(s) with water repellant cover (for example, large plastic bag) or a cast cover and may then take shower. Edema Control - Lymphedema / SCD / Other Elevate legs to the level of the heart or above for 30 minutes daily and/or when sitting for 3-4 times a day throughout the day. A void standing for long periods of time. Exercise regularly If compression wraps slide down please call wound center and speak with a nurse. Wound Treatment Wound #4 - Lower Leg Wound Laterality: Left, Anterior Cleanser: Soap and Water 1 x Per Week/30 Days Discharge Instructions: May shower and wash wound with dial antibacterial soap and water prior to dressing change. Cleanser: Vashe 5.8 (oz) 1 x Per Week/30 Days Discharge Instructions: Cleanse the wound with Vashe prior to applying a clean dressing using gauze sponges, not tissue or cotton balls. Prim Dressing: Hydrofera Blue Ready Transfer Foam, 4x5 (in/in) 1 x Per Week/30 Days ary Discharge Instructions: Apply to wound bed as instructed AJNA, MOORS ([id number]) [id number] Page 6 of 13 Secondary Dressing: T Non-adherent Dressing, 2x3 in 1 x Per Week/30  Days elfa Discharge Instructions: Apply over primary dressing as directed. Secured With: Coban Self-Adherent Wrap 4x5 (in/yd) 1 x Per Week/30 Days Discharge Instructions: Secure with Coban as directed. Secured With: [employer], 4.5x3.1 (in/yd) 1 x Per Week/30 Days Discharge Instructions: Secure with cotton as directed. Wound #5 - Lower Leg Wound Laterality: Left, Anterior, Proximal Cleanser: Soap and Water 1 x Per Week/30 Days Discharge Instructions: May shower and wash wound with dial antibacterial soap and water prior to dressing change. Cleanser: Vashe 5.8 (oz) 1 x Per Week/30 Days Discharge Instructions: Cleanse the wound with Vashe prior to applying a clean dressing using gauze sponges, not tissue or cotton balls. Prim Dressing: Hydrofera Blue Ready Transfer Foam, 4x5 (in/in) 1 x Per Week/30 Days ary Discharge Instructions: Apply to wound bed as instructed Secondary Dressing: T Non-adherent Dressing, 2x3 in 1 x Per Week/30 Days elfa Discharge Instructions: Apply over primary dressing as directed. Secured With: [employer]  Wrap 4x5 (in/yd) 1 x Per Week/30 Days Discharge Instructions: Secure with Coban as directed. Secured With: [employer], 4.5x3.1 (in/yd) 1 x Per Week/30 Days Discharge Instructions: Secure with cotton as directed. Wound #6 - Knee Wound Laterality: Left Cleanser: Soap and Water 1 x Per Day/30 Days Discharge Instructions: May shower and wash wound with dial antibacterial soap and water prior to dressing change. Cleanser: Vashe 5.8 (oz) 1 x Per Day/30 Days Discharge Instructions: Cleanse the wound with Vashe prior to applying a clean dressing using gauze sponges, not tissue or cotton balls. Prim Dressing: Hydrofera Blue Ready Transfer Foam, 2.5x2.5 (in/in) 1 x Per Day/30 Days ary Discharge Instructions: Apply directly to wound bed as directed Secondary Dressing: T Non-adherent Dressing, 2x3 in 1 x Per Day/30 Days elfa Discharge Instructions:  Apply over primary dressing as directed. Secured With: Elastic Bandage 4 inch (ACE bandage) 1 x Per Day/30 Days Discharge Instructions: Secure with ACE bandage as directed. Secured With: [employer], 4.5x3.1 (in/yd) 1 x Per Day/30 Days Discharge Instructions: Secure with cotton as directed. Electronic Signature(s) Signed: 04/20/2023 4:31:09 PM By: Duanne Guess MD FACS Signed: 04/20/2023 5:35:35 PM By: Karie Schwalbe RN Entered By: Karie Schwalbe on 04/20/2023 14:28:37 -------------------------------------------------------------------------------- Problem List Details Patient Name: Date of Service: [name] [name] 04/20/2023 1:45 PM Medical Record Number: [id number] Patient Account Number: [id number] Date of Birth/Sex: Treating RN: [birth year] (77 y.o. F) Primary Care Provider: Dina Rich Other Clinician: Referring Provider: Treating Provider/Extender: Pincus Badder in Treatment: 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 04/20/2023 1:45 PM Medical Record Number: [id number] Patient Account Number: [id number] Date of Birth/Sex: Treating RN: 1946/05/07 (77 y.o. F) Primary Care Provider: Dina Rich Other  Clinician: Referring Provider: Treating Provider/Extender: Pincus Badder in Treatment: 12 Subjective Chief Complaint Information obtained from Patient Patient seen for complaints of Non-Healing Wound. History of Present Illness (HPI) ADMISSION 02/19/2022 This is a 77 year old woman with minimal pertinent medical history. She does have COPD and pulmonary hypertension, but is not diabetic and is not a smoker. About 6 months ago, she and her husband got a new beagle puppy. The puppy scratched her leg and the scratches ultimately deteriorated into ulcerations. Apparently she sought care with a dermatologist who recommended applying a one-to-one mixture of peroxide and water to the wounds followed by a thick layer of Vaseline. She continue this for some time but then saw her primary care provider who told her to discontinue the peroxide. She has not been on any antibiotics for the scratches. Today, there are 3 separate wounds on her anterior tibial surface on the left. They are tender and her leg has localized swelling. There is yellow slough buildup in each of the wound bases. ABI in clinic today was normal at 1.16. 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 02/25/2022: Over the past week, the wounds themselves have not improved tremendously but she has noticed some stinging  and the periwound skin is quite inflamed. I took a culture last week that had low levels of methicillin-resistant Staph aureus. Because it was fairly low level, I did not prescribe an oral antibiotic. I had planned to change her to mupirocin today, however. We have been using Santyl under Hydrofera Blue with 3 layer compression. 03/04/2022: The wounds have improved quite a bit over the past week. They are less painful and red. She has been taking the prescribed doxycycline but says that it gives her a stomachache. We have been using mupirocin with Hydrofera Blue and 3 layer  compression. 03/11/2022: Continued improvement of the wounds. They have contracted quite a bit and have a good surface of healthy granulation tissue present. There is some dried eschar present around all 3 of the lesions. 03/17/2022: No significant change in the wounds from last week. The surface is clean with just a little bit of eschar and slough accumulation. No odor or drainage. No concern for infection. 03/26/2022: All of her wounds are smaller this week. There is good granulation tissue on the surface of each. No slough accumulation. There is just some dry skin around the wounds but not actually involving the wounds. No concern for infection. 04/04/2022: The 2 proximal wounds have healed. The distal wound is much smaller and just has a little bit of dry eschar around the edges. 04/11/2022: Her wound is nearly healed. It is clean without concern for infection. ORCHID, GLASSBERG ([id number]) [id number] Page 8 of 13 04/18/2022: Her wound has healed. READMISSION 01/26/2023 About 8 weeks ago, she sustained another scratch from her Beagle on her left anterior tibial surface.. She has been trying to treat the wound at home with Vaseline. There is thick slough buildup on the wound surface and the periwound skin looks a bit macerated. No overt sign of infection. 01/29/2023: She came in for an unscheduled visit today because her wound began bleeding. It apparently bled quite profusely and her husband used an over-the- counter topical agent to get it to stop. On inspection today, he managed to achieve good hemostasis and there is no ongoing blood loss. 02/03/2023: No further issues with bleeding. There is minimal slough accumulation. Some hypertrophic granulation tissue present. 02/11/2023: She has had a lot more drainage over the past week and there has been more breakdown of the skin around her wound. The wound itself has expanded and there is quite a bit of slough on the  surfaces. 02/18/2023: Her wound looks much better this week. The drainage has decreased and the periwound skin is in much improved condition. The wound is smaller and there is just 1 tiny satellite adjacent to the main wound. There is slough accumulation on the surfaces. The culture that I took last week returned with methicillin sensitive Staph aureus. She is currently taking Keflex for this. 02/25/2023: Her wound is smaller again this week with minimal slough accumulation. The periwound looks significantly improved. She has completed her oral Keflex. 03/04/2023: The wound continues to contract. The periwound is completely healed and looks like normal intact skin. There is some slough on the surface of the wound overlying good granulation tissue. 03/11/2023: The wound measured about the same size today, although visually it appears smaller. There is a little bit of slough on the surface with good granulation tissue underneath. 03/17/2023: The wound is a little bit smaller today with light slough on the wound surface. No erythema, induration, or significant drainage. 03/30/2023: The wound is a little bit smaller again today with  minimal slough and eschar accumulation. 04/06/2023: Her skin irritation from the Zetuvit dressing has gotten worse and she has had a fair amount of skin breakdown in the distribution of the bandage. This is also caused some breakdown of the existing wound. In addition, when she was at a wedding this weekend, she fell and suffered a large skin tear to her proximal left lower leg and left knee. The fat layer is exposed. There is slough accumulation on the surfaces. 04/13/2023: All of the wounds have deteriorated. They are red and angry-looking. There is slough on all of the surfaces. 04/20/2023: There has been significant improvement since last week. The erythema and irritation have resolved. There is minimal slough on the wound surfaces along with a little bit of light periwound eschar.  She continues on Keflex. Patient History Information obtained from Patient, Caregiver. Family History Unknown History. Social History Former smoker, Marital Status - Married, Alcohol Use - Never, Drug Use - No History, Caffeine Use - Rarely. Medical History Eyes Patient has history of Cataracts - bil removed Denies history of Glaucoma, Optic Neuritis Ear/Nose/Mouth/Throat Denies history of Chronic sinus problems/congestion, Middle ear problems Hematologic/Lymphatic Patient has history of Anemia Respiratory Patient has history of Chronic Obstructive Pulmonary Disease (COPD) Cardiovascular Patient has history of Coronary Artery Disease, Peripheral Venous Disease - varicose veins Endocrine Denies history of Type I Diabetes, Type II Diabetes Genitourinary Denies history of End Stage Renal Disease Immunological Patient has history of Raynauds Denies history of Lupus Erythematosus, Scleroderma Integumentary (Skin) Denies history of History of Burn Musculoskeletal Patient has history of Rheumatoid Arthritis, Osteoarthritis Denies history of Gout Oncologic Denies history of Received Chemotherapy, Received Radiation Psychiatric Denies history of Anorexia/bulimia, Confinement Anxiety Hospitalization/Surgery History - bil cataract removal. - left shoiulder replacement. - right rotator cuff repair. - multiple lumbar spine surgeries. - melanoma removed left arm and chest. - partial excision bone left 2nd toe. Medical A Surgical History Notes nd Hematologic/Lymphatic varicose vein of leg, mixed hyperlipidemia Respiratory dyspnea, chronic bronchitis, pulmonary hypertension, pulmonary emphysema Cardiovascular aortic calcification, tachycardia, left ventricular dysfunction, bradycardia Gastrointestinal VERLENE, GLANTZ ([id number]) [id number] Page 9 of 13 Gerd Endocrine prediabetes, hypothyroidism Genitourinary bladder disorder Integumentary (Skin) contact  dermatitis Musculoskeletal acquired plantar porokeratosis, neurogenic claudication d/t lumbar spinal stenosis, supraspinatus tendinitis, restless leg syndrome, spinal stenosis of lumbar region Neurologic TIA, insomnia Objective Constitutional no acute distress. Vitals Time Taken: 1:33 AM, Height: 63 in, Weight: 115 lbs, BMI: 20.4, Temperature: 98.6 F, Pulse: 105 bpm, Respiratory Rate: 20 breaths/min, Blood Pressure: 135/68 mmHg. Respiratory Normal work of breathing on room air. General Notes: 04/20/2023: There has been significant improvement since last week. The erythema and irritation have resolved. There is minimal slough on the wound surfaces along with a little bit of light periwound eschar. Integumentary (Hair, Skin) Wound #4 status is Open. Original cause of wound was Trauma. The date acquired was: 11/27/2022. The wound has been in treatment 12 weeks. The wound is located on the Left,Anterior Lower Leg. The wound measures 12cm length x 9.5cm width x 0.1cm depth; 89.535cm^2 area and 8.954cm^3 volume. There is Fat Layer (Subcutaneous Tissue) exposed. There is no tunneling or undermining noted. There is a large amount of serosanguineous drainage noted. The wound margin is distinct with the outline attached to the wound base. There is large (67-100%) red, pink granulation within the wound bed. There is a small (1-33%) amount of necrotic tissue within the wound bed including Eschar and Adherent Slough. The periwound skin appearance had no abnormalities noted for  moisture. The periwound skin appearance exhibited: Excoriation, Rash, Hemosiderin Staining. The periwound skin appearance did not exhibit: Callus, Crepitus, Induration, Scarring, Atrophie Blanche, Cyanosis, Ecchymosis, Mottled, Pallor, Rubor, Erythema. Periwound temperature was noted as No Abnormality. Wound #5 status is Open. Original cause of wound was Skin T ear/Laceration. The date acquired was: 04/02/2023. The wound has been in  treatment 2 weeks. The wound is located on the Left,Proximal,Anterior Lower Leg. The wound measures 6.5cm length x 2.5cm width x 0.1cm depth; 12.763cm^2 area and 1.276cm^3 volume. There is Fat Layer (Subcutaneous Tissue) exposed. There is a medium amount of serosanguineous drainage noted. The wound margin is distinct with the outline attached to the wound base. There is large (67-100%) red granulation within the wound bed. There is a small (1-33%) amount of necrotic tissue within the wound bed including Adherent Slough. The periwound skin appearance had no abnormalities noted for texture. The periwound skin appearance had no abnormalities noted for moisture. The periwound skin appearance exhibited: Rubor. The periwound skin appearance did not exhibit: Ecchymosis. Periwound temperature was noted as No Abnormality. Wound #6 status is Open. Original cause of wound was Skin T ear/Laceration. The date acquired was: 04/02/2023. The wound has been in treatment 2 weeks. The wound is located on the Left Knee. The wound measures 1.5cm length x 2.5cm width x 0.1cm depth; 2.945cm^2 area and 0.295cm^3 volume. There is Fat Layer (Subcutaneous Tissue) exposed. There is a medium amount of serosanguineous drainage noted. The wound margin is distinct with the outline attached to the wound base. There is large (67-100%) red granulation within the wound bed. There is a small (1-33%) amount of necrotic tissue within the wound bed including Adherent Slough. The periwound skin appearance had no abnormalities noted for moisture. The periwound skin appearance exhibited: Scarring, Ecchymosis. Periwound temperature was noted as No Abnormality. 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 There was a Selective/Open Wound Non-Viable Tissue Debridement with a total area of 89.49 sq cm performed by Duanne Guess, MD. With the following instrument(s): Curette to remove Non-Viable tissue/material. Material removed includes Eschar and Slough and after achieving pain control using Lidocaine 4% Topical Solution. No specimens were taken. A time out was conducted at 14:10, prior to the start of the procedure. A Minimum amount of bleeding was DAMA, HEDGEPETH ([id number]) [id number] Page 10 of 13 controlled with Pressure. The procedure was tolerated well with a pain level of 0 throughout and a pain level of 0 following the procedure. Post Debridement Measurements: 12cm length x 9.5cm width x 0.1cm depth; 8.954cm^3 volume. Character of Wound/Ulcer Post Debridement is improved. Severity of Tissue Post Debridement is: Fat layer exposed. Post procedure Diagnosis Wound #4: Same as Pre-Procedure General Notes: Scribed for Dr. Lady Gary by J.Scotton. Wound #5 Pre-procedure diagnosis of Wound #5 is a Skin T located on the Left,Proximal,Anterior Lower Leg . There was a Selective/Open Wound Non-Viable Tissue ear Debridement with a total area of 12.76 sq cm performed by Duanne Guess, MD. With the following instrument(s): Curette to remove Non-Viable tissue/material. Material removed includes Eschar and Slough and after achieving pain control using Lidocaine 4% T opical Solution. No specimens were taken. A time out was conducted at  14:10, prior to the start of the procedure. A Minimum amount of bleeding was controlled with Pressure. The procedure was tolerated well with a pain level of 0 throughout and a pain level of 0 following the procedure. Post Debridement Measurements: 6.5cm length x 2.5cm width x 0.1cm depth; 1.276cm^3  volume. Character of Wound/Ulcer Post Debridement is improved. Post procedure Diagnosis Wound #5: Same as Pre-Procedure General Notes: Scribed for Dr. Lady Gary by J.Scotton. Wound #6 Pre-procedure diagnosis of Wound #6 is a Skin T located on the Left Knee . There was a Selective/Open Wound Non-Viable Tissue Debridement with a total ear area of 2.94 sq cm performed by Duanne Guess, MD. With the following instrument(s): Curette to remove Non-Viable tissue/material. Material removed includes Eschar and Slough and after achieving pain control using Lidocaine 4% Topical Solution. No specimens were taken. A time out was conducted at 14:10, prior to the start of the procedure. A Minimum amount of bleeding was controlled with Pressure. The procedure was tolerated well with a pain level of 0 throughout and a pain level of 0 following the procedure. Post Debridement Measurements: 1.5cm length x 2.5cm width x 0.1cm depth; 0.295cm^3 volume. Character of Wound/Ulcer Post Debridement is improved. Post procedure Diagnosis Wound #6: Same as Pre-Procedure General Notes: Scribed for Dr. Lady Gary by J.Scotton. Plan Follow-up Appointments: Return Appointment in 1 week. - Dr. Lady Gary Room 2 July 1st July 8th 10:15 am (Room 3) Anesthetic: (In clinic) Topical Lidocaine 4% applied to wound bed Bathing/ Shower/ Hygiene: May shower with protection but do not get wound dressing(s) wet. Protect dressing(s) with water repellant cover (for example, large plastic bag) or a cast cover and may then take shower. Edema Control - Lymphedema / SCD / Other: Elevate legs to the level of the heart or above for 30 minutes daily and/or when sitting for 3-4 times a day throughout the day. Avoid standing for long periods of time. Exercise regularly If compression wraps slide down please call wound center and speak with a nurse. WOUND #4: - Lower Leg Wound Laterality: Left, Anterior Cleanser: Soap and Water 1 x Per Week/30  Days Discharge Instructions: May shower and wash wound with dial antibacterial soap and water prior to dressing change. Cleanser: Vashe 5.8 (oz) 1 x Per Week/30 Days Discharge Instructions: Cleanse the wound with Vashe prior to applying a clean dressing using gauze sponges, not tissue or cotton balls. Prim Dressing: Hydrofera Blue Ready Transfer Foam, 4x5 (in/in) 1 x Per Week/30 Days ary Discharge Instructions: Apply to wound bed as instructed Secondary Dressing: T Non-adherent Dressing, 2x3 in 1 x Per Week/30 Days elfa Discharge Instructions: Apply over primary dressing as directed. Secured With: Coban Self-Adherent Wrap 4x5 (in/yd) 1 x Per Week/30 Days Discharge Instructions: Secure with Coban as directed. Secured With: [employer], 4.5x3.1 (in/yd) 1 x Per Week/30 Days Discharge Instructions: Secure with cotton as directed. WOUND #5: - Lower Leg Wound Laterality: Left, Anterior, Proximal Cleanser: Soap and Water 1 x Per Week/30 Days Discharge Instructions: May shower and wash wound with dial antibacterial soap and water prior to dressing change. Cleanser: Vashe 5.8 (oz) 1 x Per Week/30 Days Discharge Instructions: Cleanse the wound with Vashe prior to applying a clean dressing using gauze sponges, not tissue or cotton balls. Prim Dressing: Hydrofera Blue Ready Transfer Foam, 4x5 (in/in) 1 x Per Week/30 Days ary Discharge Instructions: Apply to wound bed as instructed Secondary Dressing: T Non-adherent Dressing, 2x3 in 1 x Per Week/30 Days elfa Discharge Instructions: Apply over primary dressing  as directed. Secured With: Coban Self-Adherent Wrap 4x5 (in/yd) 1 x Per Week/30 Days Discharge Instructions: Secure with Coban as directed. Secured With: [employer], 4.5x3.1 (in/yd) 1 x Per Week/30 Days Discharge Instructions: Secure with cotton as directed. WOUND #6: - Knee Wound Laterality: Left Cleanser: Soap and Water 1 x Per Day/30 Days Discharge Instructions: May shower  and wash wound with dial antibacterial soap and water prior to dressing change. Cleanser: Vashe 5.8 (oz) 1 x Per Day/30 Days Discharge Instructions: Cleanse the wound with Vashe prior to applying a clean dressing using gauze sponges, not tissue or cotton balls. Prim Dressing: Hydrofera Blue Ready Transfer Foam, 2.5x2.5 (in/in) 1 x Per Day/30 Days ary Discharge Instructions: Apply directly to wound bed as directed Secondary Dressing: T Non-adherent Dressing, 2x3 in 1 x Per Day/30 Days elfa Discharge Instructions: Apply over primary dressing as directed. Secured With: Elastic Bandage 4 inch (ACE bandage) 1 x Per Day/30 Days Discharge Instructions: Secure with ACE bandage as directed. Secured With: [employer], 4.5x3.1 (in/yd) 1 x Per Day/30 Days Discharge Instructions: Secure with cotton as directed. 04/20/2023: There has been significant improvement since last week. The erythema and irritation have resolved. There is minimal slough on the wound surfaces along with a little bit of light periwound eschar. LASHARA, UREY ([id number]) [id number] Page 11 of 13 I used a curette to debride slough and eschar from the wounds. We will continue Hydrofera Blue with cotton and Coban wrap. She should complete her course of Keflex. Follow-up in 1 week. Electronic Signature(s) Signed: 04/20/2023 2:28:43 PM By: Duanne Guess MD FACS Entered By: Duanne Guess on 04/20/2023 14:28:43 -------------------------------------------------------------------------------- HxROS Details Patient Name: Date of Service: [name] [name] 04/20/2023 1:45 PM Medical Record Number: [id number] Patient Account Number: [id number] Date of Birth/Sex: Treating RN: 1946/03/13 (77 y.o. F) Primary Care Provider: Dina Rich Other Clinician: Referring Provider: Treating Provider/Extender: Pincus Badder in Treatment: 12 Information Obtained From Patient  Caregiver Eyes Medical History: Positive for: Cataracts - bil removed Negative for: Glaucoma; Optic Neuritis Ear/Nose/Mouth/Throat Medical History: Negative for: Chronic sinus problems/congestion; Middle ear problems Hematologic/Lymphatic Medical History: Positive for: Anemia Past Medical History Notes: varicose vein of leg, mixed hyperlipidemia Respiratory Medical History: Positive for: Chronic Obstructive Pulmonary Disease (COPD) Past Medical History Notes: dyspnea, chronic bronchitis, pulmonary hypertension, pulmonary emphysema Cardiovascular Medical History: Positive for: Coronary Artery Disease; Peripheral Venous Disease - varicose veins Past Medical History Notes: aortic calcification, tachycardia, left ventricular dysfunction, bradycardia Gastrointestinal Medical History: Past Medical History Notes: Gerd Endocrine Medical History: Negative for: Type I Diabetes; Type II Diabetes Past Medical History Notes: prediabetes, hypothyroidism Genitourinary Medical History: Negative for: End Stage Renal Disease AREONNA, BRAN ([id number]) [id number] Page 12 of 13 Past Medical History Notes: bladder disorder Immunological Medical History: Positive for: Raynauds Negative for: Lupus Erythematosus; Scleroderma Integumentary (Skin) Medical History: Negative for: History of Burn Past Medical History Notes: contact dermatitis Musculoskeletal Medical History: Positive for: Rheumatoid Arthritis; Osteoarthritis Negative for: Gout Past Medical History Notes: acquired plantar porokeratosis, neurogenic claudication d/t lumbar spinal stenosis, supraspinatus tendinitis, restless leg syndrome, spinal stenosis of lumbar region Neurologic Medical History: Past Medical History Notes: TIA, insomnia Oncologic Medical History: Negative for: Received Chemotherapy; Received Radiation Psychiatric Medical History: Negative for: Anorexia/bulimia; Confinement  Anxiety HBO Extended History Items Eyes: Cataracts Immunizations Pneumococcal Vaccine: Received Pneumococcal Vaccination: Yes Received Pneumococcal Vaccination On or After 60th Birthday: Yes Implantable Devices None Hospitalization / Surgery History Type of Hospitalization/Surgery bil cataract removal left shoiulder replacement right  rotator cuff repair multiple lumbar spine surgeries melanoma removed left arm and chest partial excision bone left 2nd toe Family and Social History Unknown History: Yes; Former smoker; Marital Status - Married; Alcohol Use: Never; Drug Use: No History; Caffeine Use: Rarely; Financial Concerns: No; Food, Clothing or Shelter Needs: No; Support System Lacking: No; Transportation Concerns: No Electronic Signature(s) Signed: 04/20/2023 4:31:09 PM By: Duanne Guess MD FACS Entered By: Duanne Guess on 04/20/2023 14:25:36 Kindler, [name] ([id number]) [id number] Page 13 of 13 -------------------------------------------------------------------------------- SuperBill Details Patient Name: Date of Service: LAVONNA, LAMPRON [name] 04/20/2023 Medical Record Number: [id number] Patient Account Number: [id number] Date of Birth/Sex: Treating RN: 09-Jan-1946 (77 y.o. F) Primary Care Provider: Dina Rich Other Clinician: Referring Provider: Treating Provider/Extender: Pincus Badder in Treatment: 12 Diagnosis Coding ICD-10 Codes Code Description [id number] Non-pressure chronic ulcer of other part of left lower leg with fat layer exposed L97.122 Non-pressure chronic ulcer of left thigh with fat layer exposed I27.20 Pulmonary hypertension, unspecified I51.9 Heart disease, unspecified Facility Procedures : CPT4 Code: [id number] Description: 97597 - DEBRIDE WOUND 1ST 20 SQ CM OR < ICD-10 Diagnosis Description L97.822 Non-pressure chronic ulcer of other part of left lower leg with fat layer exposed L97.122 Non-pressure  chronic ulcer of left thigh with fat layer exposed Modifier: Quantity: 1 : CPT4 Code: [id number] Description: 97598 - DEBRIDE WOUND EA ADDL 20 SQ CM ICD-10 Diagnosis Description L97.822 Non-pressure chronic ulcer of other part of left lower leg with fat layer exposed L97.122 Non-pressure chronic ulcer of left thigh with fat layer exposed Modifier: Quantity: 5 Physician Procedures : CPT4 Code Description Modifier [id number] 99214 - WC PHYS LEVEL 4 - EST PT 25 ICD-10 Diagnosis Description L97.822 Non-pressure chronic ulcer of other part of left lower leg with fat layer exposed L97.122 Non-pressure chronic ulcer of left thigh with fat  layer exposed I27.20 Pulmonary hypertension, unspecified I51.9 Heart disease, unspecified Quantity: 1 : [id number] 97597 - WC PHYS DEBR WO ANESTH 20 SQ CM ICD-10 Diagnosis Description L97.822 Non-pressure chronic ulcer of other part of left lower leg with fat layer exposed L97.122 Non-pressure chronic ulcer of left thigh with fat layer exposed Quantity: 1 : [id number] 97598 - WC PHYS DEBR WO ANESTH EA ADD 20 CM ICD-10 Diagnosis Description L97.822 Non-pressure chronic ulcer of other part of left lower leg with fat layer exposed L97.122 Non-pressure chronic ulcer of left thigh with fat layer exposed Quantity: 5 Electronic Signature(s) Signed: 04/20/2023 2:31:35 PM By: Duanne Guess MD FACS Entered By: Duanne Guess on 04/20/2023 14:31:34

## 2023-04-20 NOTE — Progress Notes (Signed)
[name], [name] ([id number]) [id number] Page 1 of 10 Visit Report for 04/20/2023 Arrival Information Details Patient Name: Date of Service: HEPHZIBAH, STREHLE [name] 04/20/2023 1:45 PM Medical Record Number: [id number] Patient Account Number: [id number] Date of Birth/Sex: Treating RN: 03-19-1946 (77 y.o. F) Primary Care Aurie Harroun: Dina Rich Other Clinician: Referring Saim Almanza: Treating Aleicia Kenagy/Extender: Pincus Badder in Treatment: 12 Visit Information History Since Last Visit All ordered tests and consults were completed: No Patient Arrived: [name] Added or deleted any medications: No Arrival Time: 13:33 Any new allergies or adverse reactions: No Accompanied By: husband Had a fall or experienced change in No Transfer Assistance: None activities of daily living that may affect Patient Identification Verified: Yes risk of falls: Secondary Verification Process Completed: Yes Signs or symptoms of abuse/neglect since last visito No Patient Requires Transmission-Based Precautions: No Pain Present Now: No Patient Has Alerts: Yes Patient Alerts: ABI L 1.16 02/19/22 Electronic Signature(s) Signed: 04/20/2023 3:04:13 PM By: Dayton Scrape Entered By: Dayton Scrape on 04/20/2023 13:33:57 -------------------------------------------------------------------------------- Encounter Discharge Information Details Patient Name: Date of Service: [name] [name] 04/20/2023 1:45 PM Medical Record Number: [id number] Patient Account Number: [id number] Date of Birth/Sex: Treating RN: April 25, 1946 (77 y.o. [name] Primary Care Tyiana Hill: Dina Rich Other Clinician: Referring Iysis Germain: Treating Eliabeth Shoff/Extender: Pincus Badder in Treatment: 12 Encounter Discharge Information Items Post Procedure Vitals Discharge Condition: Stable Temperature (F): 98.6 Ambulatory Status: Ambulatory Pulse (bpm): 105 Discharge Destination:  Home Respiratory Rate (breaths/min): 20 Transportation: Private Auto Blood Pressure (mmHg): 135/68 Accompanied By: spouse Schedule Follow-up Appointment: Yes Clinical Summary of Care: Patient Declined Electronic Signature(s) Signed: 04/20/2023 5:35:35 PM By: Karie Schwalbe RN Entered By: Karie Schwalbe on 04/20/2023 17:03:43 Lower Extremity Assessment Details -------------------------------------------------------------------------------- [name] ([id number]) [id number] Page 2 of 10 Patient Name: Date of Service: Elaine, Long [name] 04/20/2023 1:45 PM Medical Record Number: [id number] Patient Account Number: [id number] Date of Birth/Sex: Treating RN: [birth year] (77 y.o. [name] Primary Care Jae Bruck: Dina Rich Other Clinician: Referring Kaida Games: Treating Ayodele Hartsock/Extender: Pincus Badder in Treatment: 12 Edema Assessment Left: Right: Assessed: No No Edema: No Calf Left: Right: Point of Measurement: 25 cm From Medial Instep 31 cm Ankle Left: Right: Point of Measurement: 10 cm From Medial Instep 18 cm Vascular Assessment Left: Right: Pulses: Dorsalis Pedis Palpable: Yes Electronic Signature(s) Signed: 04/20/2023 5:35:35 PM By: Karie Schwalbe RN Entered By: Karie Schwalbe on 04/20/2023 14:01:07 -------------------------------------------------------------------------------- Multi Wound Chart Details Patient Name: Date of Service: [name] [name] 04/20/2023 1:45 PM Medical Record Number: [id number] Patient Account Number: [id number] Date of Birth/Sex: Treating RN: [birth year] (77 y.o. F) Primary Care Merica Prell: Dina Rich Other Clinician: Referring Haile Toppins: Treating Enes Wegener/Extender: Pincus Badder in Treatment: 12 Vital Signs Height(in): 63 Pulse(bpm): 105 Weight(lbs): 115 Blood Pressure(mmHg): 135/68 Body Mass Index(BMI): 20.4 Temperature(F): 98.6 Respiratory  Rate(breaths/min): 20 [4:Photos:] Left, Anterior Lower Leg Left, Proximal, Anterior Lower Leg Left Knee Wound Location: Trauma Skin Tear/Laceration Skin Tear/Laceration Wounding Event: Venous Leg Ulcer Skin Tear Skin Tear Primary Etiology: Cataracts, Anemia, Chronic Cataracts, Anemia, Chronic Cataracts, Anemia, Chronic Comorbid History: Obstructive Pulmonary Disease Obstructive Pulmonary Disease Obstructive Pulmonary Disease (COPD), Coronary Artery Disease, (COPD), Coronary Artery Disease, (COPD), Coronary Artery Disease, Peripheral Venous Disease, Peripheral Venous Disease, Peripheral Venous Disease, Raynauds, Rheumatoid Arthritis, Raynauds, Rheumatoid Arthritis, Raynauds, Rheumatoid Arthritis, SEASON, ASTACIO K ([id number]) [id number] Page 3 of 10 Osteoarthritis Osteoarthritis Osteoarthritis 11/27/2022 04/02/2023 04/02/2023 Date Acquired: 12 2 2  Weeks of Treatment: Open Open Open Wound Status:  No No No Wound Recurrence: Yes No No Clustered Wound: 12x9.5x0.1 6.5x2.5x0.1 1.5x2.5x0.1 Measurements L x W x D (cm) 89.535 12.763 2.945 A (cm) : rea 8.954 1.276 0.295 Volume (cm) : -927.00% 54.90% 63.20% % Reduction in Area: -926.80% 54.90% 63.20% % Reduction in Volume: Full Thickness Without Exposed Full Thickness Without Exposed Full Thickness Without Exposed Classification: Support Structures Support Structures Support Structures Large Medium Medium Exudate A mount: Serosanguineous Serosanguineous Serosanguineous Exudate Type: red, brown red, brown red, brown Exudate Color: Distinct, outline attached Distinct, outline attached Distinct, outline attached Wound Margin: Large (67-100%) Large (67-100%) Large (67-100%) Granulation Amount: Red, Pink Red Red Granulation Quality: Small (1-33%) Small (1-33%) Small (1-33%) Necrotic Amount: Eschar, Adherent Slough Adherent [employer] Necrotic Tissue: Fat Layer (Subcutaneous Tissue): Yes Fat Layer  (Subcutaneous Tissue): Yes Fat Layer (Subcutaneous Tissue): Yes Exposed Structures: Fascia: No Fascia: No Fascia: No Tendon: No Tendon: No Tendon: No Muscle: No Muscle: No Muscle: No Joint: No Joint: No Joint: No Bone: No Bone: No Bone: No Small (1-33%) None None Epithelialization: Excoriation: Yes No Abnormalities Noted Scarring: Yes Periwound Skin Texture: Rash: Yes Induration: No Callus: No Crepitus: No Scarring: No Maceration: Yes No Abnormalities Noted No Abnormalities Noted Periwound Skin Moisture: Dry/Scaly: No Hemosiderin Staining: Yes Rubor: Yes Ecchymosis: Yes Periwound Skin Color: Atrophie Blanche: No Ecchymosis: No Cyanosis: No Ecchymosis: No Erythema: No Mottled: No Pallor: No Rubor: No No Abnormality No Abnormality No Abnormality Temperature: Treatment Notes Electronic Signature(s) Signed: 04/20/2023 2:12:10 PM By: Duanne Guess MD FACS Entered By: Duanne Guess on 04/20/2023 14:12:09 -------------------------------------------------------------------------------- Multi-Disciplinary Care Plan Details Patient Name: Date of Service: [name] [name] 04/20/2023 1:45 PM Medical Record Number: [id number] Patient Account Number: [id number] Date of Birth/Sex: Treating RN: Apr 22, 1946 (77 y.o. [name] Primary Care Jazmyne Beauchesne: Dina Rich Other Clinician: Referring Ridhima Golberg: Treating Trusten Hume/Extender: Pincus Badder in Treatment: 12 Active Inactive Nutrition Nursing Diagnoses: Potential for alteratiion in Nutrition/Potential for imbalanced nutrition Goals: Patient/caregiver agrees to and verbalizes understanding of need to obtain nutritional consultation CHARIAH, BAILEY ([id number]) [id number] Page 4 of 10 Date Initiated: 01/26/2023 Target Resolution Date: 05/29/2023 Goal Status: Active Patient/caregiver will maintain therapeutic glucose control Date Initiated: 01/26/2023 Target Resolution  Date: 05/29/2023 Goal Status: Active Interventions: Assess patient nutrition upon admission and as needed per policy Provide education on nutrition Treatment Activities: Giving encouragement to exercise : 01/26/2023 Notes: Pain, Acute or Chronic Nursing Diagnoses: Pain, acute or chronic: actual or potential Potential alteration in comfort, pain Goals: Patient will verbalize adequate pain control and receive pain control interventions during procedures as needed Date Initiated: 01/26/2023 Target Resolution Date: 05/29/2023 Goal Status: Active Patient/caregiver will verbalize comfort level met Date Initiated: 01/26/2023 Target Resolution Date: 05/29/2023 Goal Status: Active Interventions: Complete pain assessment as per visit requirements Encourage patient to take pain medications as prescribed Provide education on pain management Treatment Activities: Administer pain control measures as ordered : 01/26/2023 Notes: Electronic Signature(s) Signed: 04/20/2023 5:35:35 PM By: Karie Schwalbe RN Entered By: Karie Schwalbe on 04/20/2023 17:01:17 -------------------------------------------------------------------------------- Pain Assessment Details Patient Name: Date of Service: [name] [name] 04/20/2023 1:45 PM Medical Record Number: [id number] Patient Account Number: [id number] Date of Birth/Sex: Treating RN: 1946/05/08 (77 y.o. F) Primary Care Rufino Staup: Dina Rich Other Clinician: Referring Jenniffer Vessels: Treating Grisell Bissette/Extender: Pincus Badder in Treatment: 12 Active Problems Location of Pain Severity and Description of Pain Patient Has Paino No Site Locations [place], [place] K ([id number]) [id number] Page 5 of 10 Pain Management and Medication Current Pain  Management: Electronic Signature(s) Signed: 04/20/2023 3:04:13 PM By: Dayton Scrape Entered By: Dayton Scrape on 04/20/2023  13:34:30 -------------------------------------------------------------------------------- Patient/Caregiver Education Details Patient Name: Date of Service: [name] [name] 6/24/2024andnbsp1:45 PM Medical Record Number: [id number] Patient Account Number: [id number] Date of Birth/Gender: Treating RN: [birth year] (77 y.o. [name] Primary Care Physician: Dina Rich Other Clinician: Referring Physician: Treating Physician/Extender: Pincus Badder in Treatment: 12 Education Assessment Education Provided To: Patient Education Topics Provided Wound/Skin Impairment: Methods: Explain/Verbal Responses: Return demonstration correctly Electronic Signature(s) Signed: 04/20/2023 5:35:35 PM By: Karie Schwalbe RN Entered By: Karie Schwalbe on 04/20/2023 17:02:35 -------------------------------------------------------------------------------- Wound Assessment Details Patient Name: Date of Service: [name] [name] 04/20/2023 1:45 PM Medical Record Number: [id number] Patient Account Number: [id number] Date of Birth/Sex: Treating RN: [birth year] (77 y.o. [name] Primary Care Harve Spradley: Dina Rich Other Clinician: Referring Deshun Sedivy: Treating Antrone Walla/Extender: Ethelene Hal [place], [name] ([id number]) [id number] Page 6 of 10 Weeks in Treatment: 12 Wound Status Wound Number: 4 Primary Venous Leg Ulcer Etiology: Wound Location: Left, Anterior Lower Leg Wound Open Wounding Event: Trauma Status: Date Acquired: 11/27/2022 Comorbid Cataracts, Anemia, Chronic Obstructive Pulmonary Disease Weeks Of Treatment: 12 History: (COPD), Coronary Artery Disease, Peripheral Venous Disease, Clustered Wound: Yes Raynauds, Rheumatoid Arthritis, Osteoarthritis Photos Wound Measurements Length: (cm) 12 Width: (cm) 9.5 Depth: (cm) 0.1 Area: (cm) 89.535 Volume: (cm) 8.954 % Reduction in Area: -927% % Reduction in Volume:  -926.8% Epithelialization: Small (1-33%) Tunneling: No Undermining: No Wound Description Classification: Full Thickness Without Exposed Support Structures Wound Margin: Distinct, outline attached Exudate Amount: Large Exudate Type: Serosanguineous Exudate Color: red, brown Foul Odor After Cleansing: No Slough/Fibrino Yes Wound Bed Granulation Amount: Large (67-100%) Exposed Structure Granulation Quality: Red, Pink Fascia Exposed: No Necrotic Amount: Small (1-33%) Fat Layer (Subcutaneous Tissue) Exposed: Yes Necrotic Quality: Eschar, Adherent Slough Tendon Exposed: No Muscle Exposed: No Joint Exposed: No Bone Exposed: No Periwound Skin Texture Texture Color No Abnormalities Noted: No No Abnormalities Noted: No Callus: No Atrophie Blanche: No Crepitus: No Cyanosis: No Excoriation: Yes Ecchymosis: No Induration: No Erythema: No Rash: Yes Hemosiderin Staining: Yes Scarring: No Mottled: No Pallor: No Moisture Rubor: No No Abnormalities Noted: Yes Temperature / Pain Temperature: No Abnormality Treatment Notes Wound #4 (Lower Leg) Wound Laterality: Left, Anterior Cleanser Soap and Water Discharge Instruction: May shower and wash wound with dial antibacterial soap and water prior to dressing change. Vashe 5.8 (oz) Discharge Instruction: Cleanse the wound with Vashe prior to applying a clean dressing using gauze sponges, not tissue or cotton balls. Peri-Wound Care MARLISE, FAHR ([id number]) [id number] Page 7 of 10 Topical Primary Dressing Hydrofera Blue Ready Transfer Foam, 4x5 (in/in) Discharge Instruction: Apply to wound bed as instructed Secondary Dressing T Non-adherent Dressing, 2x3 in elfa Discharge Instruction: Apply over primary dressing as directed. Secured With [employer] 4x5 (in/yd) Discharge Instruction: Secure with Coban as directed. Kerlix Roll Sterile, 4.5x3.1 (in/yd) Discharge Instruction: Secure with  cotton as directed. Compression Wrap Compression Stockings Add-Ons Electronic Signature(s) Signed: 04/20/2023 5:35:35 PM By: Karie Schwalbe RN Entered By: Karie Schwalbe on 04/20/2023 14:05:54 -------------------------------------------------------------------------------- Wound Assessment Details Patient Name: Date of Service: [name] [name] 04/20/2023 1:45 PM Medical Record Number: [id number] Patient Account Number: [id number] Date of Birth/Sex: Treating RN: [birth year] (77 y.o. F) Primary Care Viviene Thurston: Dina Rich Other Clinician: Referring Claudia Greenley: Treating Khira Cudmore/Extender: Pincus Badder in Treatment: 12 Wound Status Wound Number: 5 Primary Skin Tear Etiology: Wound Location: Left, Proximal, Anterior Lower Leg Wound  Open Wounding Event: Skin Tear/Laceration Status: Date Acquired: 04/02/2023 Comorbid Cataracts, Anemia, Chronic Obstructive Pulmonary Disease Weeks Of Treatment: 2 History: (COPD), Coronary Artery Disease, Peripheral Venous Disease, Clustered Wound: No Raynauds, Rheumatoid Arthritis, Osteoarthritis Photos Wound Measurements Length: (cm) 6.5 Width: (cm) 2.5 Depth: (cm) 0.1 Area: (cm) 12.763 Volume: (cm) 1.276 % Reduction in Area: 54.9% % Reduction in Volume: 54.9% Epithelialization: None Wound Description Classification: Full Thickness Without Exposed Support Structures [name], [name] ([id number]) Wound Margin: Distinct, outline attached Exudate Amount: Medium Exudate Type: Serosanguineous Exudate Color: red, brown Foul Odor After Cleansing: No [id number] Page 8 of 10 Slough/Fibrino Yes Wound Bed Granulation Amount: Large (67-100%) Exposed Structure Granulation Quality: Red Fascia Exposed: No Necrotic Amount: Small (1-33%) Fat Layer (Subcutaneous Tissue) Exposed: Yes Necrotic Quality: Adherent Slough Tendon Exposed: No Muscle Exposed: No Joint Exposed: No Bone Exposed: No Periwound Skin  Texture Texture Color No Abnormalities Noted: Yes No Abnormalities Noted: No Ecchymosis: No Moisture Rubor: Yes No Abnormalities Noted: Yes Temperature / Pain Temperature: No Abnormality Treatment Notes Wound #5 (Lower Leg) Wound Laterality: Left, Anterior, Proximal Cleanser Soap and Water Discharge Instruction: May shower and wash wound with dial antibacterial soap and water prior to dressing change. Vashe 5.8 (oz) Discharge Instruction: Cleanse the wound with Vashe prior to applying a clean dressing using gauze sponges, not tissue or cotton balls. Peri-Wound Care Topical Primary Dressing Hydrofera Blue Ready Transfer Foam, 4x5 (in/in) Discharge Instruction: Apply to wound bed as instructed Secondary Dressing T Non-adherent Dressing, 2x3 in elfa Discharge Instruction: Apply over primary dressing as directed. Secured With [employer] 4x5 (in/yd) Discharge Instruction: Secure with Coban as directed. Kerlix Roll Sterile, 4.5x3.1 (in/yd) Discharge Instruction: Secure with cotton as directed. Compression Wrap Compression Stockings Add-Ons Electronic Signature(s) Signed: 04/20/2023 5:35:35 PM By: Karie Schwalbe RN Entered By: Karie Schwalbe on 04/20/2023 14:06:54 -------------------------------------------------------------------------------- Wound Assessment Details Patient Name: Date of Service: [name] [name] 04/20/2023 1:45 PM Medical Record Number: [id number] Patient Account Number: [id number] Date of Birth/Sex: Treating RN: 1946-07-15 (77 y.o. F) Primary Care Mikhai Bienvenue: Dina Rich Other Clinician: Referring Moria Brophy: Treating Synethia Endicott/Extender: Delaney, Perona, [name] ([id number]) [id number] Page 9 of 10 Weeks in Treatment: 12 Wound Status Wound Number: 6 Primary Skin Tear Etiology: Wound Location: Left Knee Wound Open Wounding Event: Skin Tear/Laceration Status: Date Acquired: 04/02/2023 Comorbid  Cataracts, Anemia, Chronic Obstructive Pulmonary Disease Weeks Of Treatment: 2 History: (COPD), Coronary Artery Disease, Peripheral Venous Disease, Clustered Wound: No Raynauds, Rheumatoid Arthritis, Osteoarthritis Photos Wound Measurements Length: (cm) 1.5 Width: (cm) 2.5 Depth: (cm) 0.1 Area: (cm) 2.945 Volume: (cm) 0.295 % Reduction in Area: 63.2% % Reduction in Volume: 63.2% Epithelialization: None Wound Description Classification: Full Thickness Without Exposed Support Structures Wound Margin: Distinct, outline attached Exudate Amount: Medium Exudate Type: Serosanguineous Exudate Color: red, brown Foul Odor After Cleansing: No Slough/Fibrino Yes Wound Bed Granulation Amount: Large (67-100%) Exposed Structure Granulation Quality: Red Fascia Exposed: No Necrotic Amount: Small (1-33%) Fat Layer (Subcutaneous Tissue) Exposed: Yes Necrotic Quality: Adherent Slough Tendon Exposed: No Muscle Exposed: No Joint Exposed: No Bone Exposed: No Periwound Skin Texture Texture Color No Abnormalities Noted: No No Abnormalities Noted: No Scarring: Yes Ecchymosis: Yes Moisture Temperature / Pain No Abnormalities Noted: Yes Temperature: No Abnormality Treatment Notes Wound #6 (Knee) Wound Laterality: Left Cleanser Soap and Water Discharge Instruction: May shower and wash wound with dial antibacterial soap and water prior to dressing change. Vashe 5.8 (oz) Discharge Instruction: Cleanse the wound with Vashe prior to applying a clean dressing using  gauze sponges, not tissue or cotton balls. Peri-Wound Care Topical Primary Dressing Hydrofera Blue Ready Transfer Foam, 2.5x2.5 (in/in) Discharge Instruction: Apply directly to wound bed as directed [name], [name] ([id number]) [phone].pdf Page 10 of 10 Secondary Dressing T Non-adherent Dressing, 2x3 in elfa Discharge Instruction: Apply over primary dressing as directed. Secured With Elastic Bandage 4 inch  (ACE bandage) Discharge Instruction: Secure with ACE bandage as directed. Kerlix Roll Sterile, 4.5x3.1 (in/yd) Discharge Instruction: Secure with cotton as directed. Compression Wrap Compression Stockings Add-Ons Electronic Signature(s) Signed: 04/20/2023 5:35:35 PM By: Karie Schwalbe RN Entered By: Karie Schwalbe on 04/20/2023 14:07:25 -------------------------------------------------------------------------------- Vitals Details Patient Name: Date of Service: [name] [name] 04/20/2023 1:45 PM Medical Record Number: [id number] Patient Account Number: [id number] Date of Birth/Sex: Treating RN: [birth year] (77 y.o. F) Primary Care Carmon Brigandi: Dina Rich Other Clinician: Referring Shadow Stiggers: Treating Raissa Dam/Extender: Pincus Badder in Treatment: 12 Vital Signs Time Taken: 01:33 Temperature (F): 98.6 Height (in): 63 Pulse (bpm): 105 Weight (lbs): 115 Respiratory Rate (breaths/min): 20 Body Mass Index (BMI): 20.4 Blood Pressure (mmHg): 135/68 Reference Range: 80 - 120 mg / dl Electronic Signature(s) Signed: 04/20/2023 3:04:13 PM By: Dayton Scrape Entered By: Dayton Scrape on 04/20/2023 13:34:23

## 2023-04-27 ENCOUNTER — Encounter (HOSPITAL_BASED_OUTPATIENT_CLINIC_OR_DEPARTMENT_OTHER): Payer: PPO | Attending: General Surgery | Admitting: General Surgery

## 2023-04-27 DIAGNOSIS — D649 Anemia, unspecified: Secondary | ICD-10-CM | POA: Insufficient documentation

## 2023-04-27 DIAGNOSIS — E782 Mixed hyperlipidemia: Secondary | ICD-10-CM | POA: Diagnosis not present

## 2023-04-27 DIAGNOSIS — L97822 Non-pressure chronic ulcer of other part of left lower leg with fat layer exposed: Secondary | ICD-10-CM | POA: Diagnosis present

## 2023-04-27 DIAGNOSIS — I2510 Atherosclerotic heart disease of native coronary artery without angina pectoris: Secondary | ICD-10-CM | POA: Diagnosis not present

## 2023-04-27 DIAGNOSIS — I272 Pulmonary hypertension, unspecified: Secondary | ICD-10-CM | POA: Insufficient documentation

## 2023-04-27 DIAGNOSIS — M069 Rheumatoid arthritis, unspecified: Secondary | ICD-10-CM | POA: Diagnosis not present

## 2023-04-27 DIAGNOSIS — I7300 Raynaud's syndrome without gangrene: Secondary | ICD-10-CM | POA: Insufficient documentation

## 2023-04-27 DIAGNOSIS — L97122 Non-pressure chronic ulcer of left thigh with fat layer exposed: Secondary | ICD-10-CM | POA: Insufficient documentation

## 2023-04-27 DIAGNOSIS — J449 Chronic obstructive pulmonary disease, unspecified: Secondary | ICD-10-CM | POA: Diagnosis not present

## 2023-04-27 NOTE — Progress Notes (Signed)
RUMOR, SIRMAN ([id number]) [id number] Page 1 of 13 Visit Report for 04/27/2023 Chief Complaint Document Details Patient Name: Date of Service: [name], [name] [name] 04/27/2023 10:00 A M Medical Record Number: [id number] Patient Account Number: [id number] Date of Birth/Sex: Treating RN: 1946/06/25 (77 y.o. F) Primary Care Provider: Dina Rich Other Clinician: Referring Provider: Treating Provider/Extender: Pincus Badder in Treatment: 13 Information Obtained from: Patient Chief Complaint Patient seen for complaints of Non-Healing Wound. Electronic Signature(s) Signed: 04/27/2023 10:24:08 AM By: Duanne Guess MD FACS Entered By: Duanne Guess on 04/27/2023 10:24:08 -------------------------------------------------------------------------------- Debridement Details Patient Name: Date of Service: [name] [name] 04/27/2023 10:00 A M Medical Record Number: [id number] Patient Account Number: [id number] Date of Birth/Sex: Treating RN: 29-Aug-1946 (77 y.o. [name] Primary Care Provider: Dina Rich Other Clinician: Referring Provider: Treating Provider/Extender: Pincus Badder in Treatment: 13 Debridement Performed for Assessment: Wound #4 Left,Anterior Lower Leg Performed By: Physician Duanne Guess, MD Debridement Type: Debridement Severity of Tissue Pre Debridement: Fat layer exposed Level of Consciousness (Pre-procedure): Awake and Alert Pre-procedure Verification/Time Out Yes - 10:16 Taken: Start Time: 10:16 Pain Control: Lidocaine 4% T opical Solution Percent of Wound Bed Debrided: 30% T Area Debrided (cm): otal 10.6 Tissue and other material debrided: Non-Viable, Slough, Slough Level: Non-Viable Tissue Debridement Description: Selective/Open Wound Instrument: Curette Bleeding: Minimum Hemostasis Achieved: Pressure Response to Treatment: Procedure was tolerated well Level of Consciousness  (Post- Awake and Alert procedure): Post Debridement Measurements of Total Wound Length: (cm) 7.5 Width: (cm) 6 Depth: (cm) 0.1 Volume: (cm) 3.534 Character of Wound/Ulcer Post Debridement: Improved Severity of Tissue Post Debridement: Fat layer exposed Post Procedure Diagnosis Long, Elaine K ([id number]) [id number] Page 2 of 13 Same as Pre-procedure Notes scribed for Dr. Lady Gary by Samuella Bruin, RN Electronic Signature(s) Signed: 04/27/2023 10:28:27 AM By: Duanne Guess MD FACS Signed: 04/27/2023 3:44:30 PM By: Samuella Bruin Entered By: Samuella Bruin on 04/27/2023 10:17:13 -------------------------------------------------------------------------------- Debridement Details Patient Name: Date of Service: [name] [name] 04/27/2023 10:00 A M Medical Record Number: [id number] Patient Account Number: [id number] Date of Birth/Sex: Treating RN: 04/12/1946 (77 y.o. [name] Primary Care Provider: Dina Rich Other Clinician: Referring Provider: Treating Provider/Extender: Pincus Badder in Treatment: 13 Debridement Performed for Assessment: Wound #5 Left,Proximal,Anterior Lower Leg Performed By: Physician Duanne Guess, MD Debridement Type: Debridement Level of Consciousness (Pre-procedure): Awake and Alert Pre-procedure Verification/Time Out Yes - 10:16 Taken: Start Time: 10:16 Pain Control: Lidocaine 4% T opical Solution Percent of Wound Bed Debrided: 100% T Area Debrided (cm): otal 4.71 Tissue and other material debrided: Non-Viable, Slough, Slough Level: Non-Viable Tissue Debridement Description: Selective/Open Wound Instrument: Curette Bleeding: Minimum Hemostasis Achieved: Pressure Response to Treatment: Procedure was tolerated well Level of Consciousness (Post- Awake and Alert procedure): Post Debridement Measurements of Total Wound Length: (cm) 3 Width: (cm) 2 Depth: (cm) 0.1 Volume: (cm)  0.471 Character of Wound/Ulcer Post Debridement: Improved Post Procedure Diagnosis Same as Pre-procedure Notes scribed for Dr. Lady Gary by Samuella Bruin, RN Electronic Signature(s) Signed: 04/27/2023 10:28:27 AM By: Duanne Guess MD FACS Signed: 04/27/2023 3:44:30 PM By: Samuella Bruin Entered By: Samuella Bruin on 04/27/2023 10:19:38 Debridement Details -------------------------------------------------------------------------------- [name] ([id number]) [id number] Page 3 of 13 Patient Name: Date of Service: Long, Elaine [name] 04/27/2023 10:00 A M Medical Record Number: [id number] Patient Account Number: [id number] Date of Birth/Sex: Treating RN: 05-Oct-1946 (77 y.o. [name] Primary Care Provider: Dina Rich Other Clinician: Referring Provider: Treating Provider/Extender: Allena Earing,  Wonda Cerise in Treatment: 13 Debridement Performed for Assessment: Wound #6 Left Knee Performed By: Physician Duanne Guess, MD Debridement Type: Debridement Level of Consciousness (Pre-procedure): Awake and Alert Pre-procedure Verification/Time Out Yes - 10:16 Taken: Start Time: 10:16 Pain Control: Lidocaine 4% Topical Solution Percent of Wound Bed Debrided: 100% T Area Debrided (cm): otal 0.71 Tissue and other material debrided: Non-Viable, Eschar, Slough, Slough Level: Non-Viable Tissue Debridement Description: Selective/Open Wound Instrument: Curette Bleeding: Minimum Hemostasis Achieved: Pressure Response to Treatment: Procedure was tolerated well Level of Consciousness (Post- Awake and Alert procedure): Post Debridement Measurements of Total Wound Length: (cm) 0.5 Width: (cm) 1.8 Depth: (cm) 0.1 Volume: (cm) 0.071 Character of Wound/Ulcer Post Debridement: Improved Post Procedure Diagnosis Same as Pre-procedure Notes scribed for Dr. Lady Gary by Samuella Bruin, RN Electronic Signature(s) Signed: 04/27/2023  10:28:27 AM By: Duanne Guess MD FACS Signed: 04/27/2023 3:44:30 PM By: Samuella Bruin Entered By: Samuella Bruin on 04/27/2023 10:20:52 -------------------------------------------------------------------------------- HPI Details Patient Name: Date of Service: [name] [name] 04/27/2023 10:00 A M Medical Record Number: [id number] Patient Account Number: [id number] Date of Birth/Sex: Treating RN: 06/21/1946 (77 y.o. F) Primary Care Provider: Dina Rich Other Clinician: Referring Provider: Treating Provider/Extender: Pincus Badder in Treatment: 13 History of Present Illness HPI Description: ADMISSION 02/19/2022 This is a 77 year old woman with minimal pertinent medical history. She does have COPD and pulmonary hypertension, but is not diabetic and is not a smoker. About 6 months ago, she and her husband got a new beagle puppy. The puppy scratched her leg and the scratches ultimately deteriorated into ulcerations. Apparently she sought care with a dermatologist who recommended applying a one-to-one mixture of peroxide and water to the wounds followed by a thick layer of Vaseline. She continue this for some time but then saw her primary care provider who told her to discontinue the peroxide. She has not been on any antibiotics for the scratches. Today, there are 3 separate wounds on her anterior tibial surface on the left. They are tender and her leg has localized swelling. There is yellow slough buildup in each of the wound bases. ABI in clinic today was normal at 1.16. 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 02/25/2022: Over the past week, the wounds themselves have not improved tremendously but she has noticed some stinging and the periwound skin is quite inflamed. I took a culture last week that had low levels of methicillin-resistant Staph aureus. Because it was fairly low level, I did not prescribe an oral antibiotic. I had planned  to change her to mupirocin today, however. We have been using Santyl under Hydrofera Blue with 3 layer compression. ILYNN, SOWDER ([id number]) [id number] Page 4 of 13 03/04/2022: The wounds have improved quite a bit over the past week. They are less painful and red. She has been taking the prescribed doxycycline but says that it gives her a stomachache. We have been using mupirocin with Hydrofera Blue and 3 layer compression. 03/11/2022: Continued improvement of the wounds. They have contracted quite a bit and have a good surface of healthy granulation tissue present. There is some dried eschar present around all 3 of the lesions. 03/17/2022: No significant change in the wounds from last week. The surface is clean with just a little bit of eschar and slough accumulation. No odor or drainage. No concern for infection. 03/26/2022: All of her wounds are smaller this week. There is good granulation tissue on the surface of each. No slough accumulation. There is just  some dry skin around the wounds but not actually involving the wounds. No concern for infection. 04/04/2022: The 2 proximal wounds have healed. The distal wound is much smaller and just has a little bit of dry eschar around the edges. 04/11/2022: Her wound is nearly healed. It is clean without concern for infection. 04/18/2022: Her wound has healed. READMISSION 01/26/2023 About 8 weeks ago, she sustained another scratch from her Beagle on her left anterior tibial surface.. She has been trying to treat the wound at home with Vaseline. There is thick slough buildup on the wound surface and the periwound skin looks a bit macerated. No overt sign of infection. 01/29/2023: She came in for an unscheduled visit today because her wound began bleeding. It apparently bled quite profusely and her husband used an over-the- counter topical agent to get it to stop. On inspection today, he managed to achieve good hemostasis and there is no  ongoing blood loss. 02/03/2023: No further issues with bleeding. There is minimal slough accumulation. Some hypertrophic granulation tissue present. 02/11/2023: She has had a lot more drainage over the past week and there has been more breakdown of the skin around her wound. The wound itself has expanded and there is quite a bit of slough on the surfaces. 02/18/2023: Her wound looks much better this week. The drainage has decreased and the periwound skin is in much improved condition. The wound is smaller and there is just 1 tiny satellite adjacent to the main wound. There is slough accumulation on the surfaces. The culture that I took last week returned with methicillin sensitive Staph aureus. She is currently taking Keflex for this. 02/25/2023: Her wound is smaller again this week with minimal slough accumulation. The periwound looks significantly improved. She has completed her oral Keflex. 03/04/2023: The wound continues to contract. The periwound is completely healed and looks like normal intact skin. There is some slough on the surface of the wound overlying good granulation tissue. 03/11/2023: The wound measured about the same size today, although visually it appears smaller. There is a little bit of slough on the surface with good granulation tissue underneath. 03/17/2023: The wound is a little bit smaller today with light slough on the wound surface. No erythema, induration, or significant drainage. 03/30/2023: The wound is a little bit smaller again today with minimal slough and eschar accumulation. 04/06/2023: Her skin irritation from the Zetuvit dressing has gotten worse and she has had a fair amount of skin breakdown in the distribution of the bandage. This is also caused some breakdown of the existing wound. In addition, when she was at a wedding this weekend, she fell and suffered a large skin tear to her proximal left lower leg and left knee. The fat layer is exposed. There is slough accumulation  on the surfaces. 04/13/2023: All of the wounds have deteriorated. They are red and angry-looking. There is slough on all of the surfaces. 04/20/2023: There has been significant improvement since last week. The erythema and irritation have resolved. There is minimal slough on the wound surfaces along with a little bit of light periwound eschar. She continues on Keflex. 04/27/2023: Continued improvement of her wounds. All of them are smaller. The knee wound is nearly closed. There is slough and eschar on all of the wound surfaces. She has completed her course of oral antibiotics. Electronic Signature(s) Signed: 04/27/2023 10:24:59 AM By: Duanne Guess MD FACS Entered By: Duanne Guess on 04/27/2023 10:24:59 -------------------------------------------------------------------------------- Physical Exam Details Patient Name: Date of Service: Nabers,  [name] 04/27/2023 10:00 A M Medical Record Number: [id number] Patient Account Number: [id number] Date of Birth/Sex: Treating RN: [birth year] (77 y.o. F) Primary Care Provider: Dina Rich Other Clinician: Referring Provider: Treating Provider/Extender: Pincus Badder in Treatment: 13 Constitutional . . . . no acute distress. MANUELA, ROSENDAHL ([id number]) [id number] Page 5 of 13 Respiratory Normal work of breathing on room air. Notes 04/27/2023: Continued improvement of her wounds. All of them are smaller. The knee wound is nearly closed. There is slough and eschar on all of the wound surfaces. Electronic Signature(s) Signed: 04/27/2023 10:25:54 AM By: Duanne Guess MD FACS Entered By: Duanne Guess on 04/27/2023 10:25:54 -------------------------------------------------------------------------------- Physician Orders Details Patient Name: Date of Service: [name] [name] 04/27/2023 10:00 A M Medical Record Number: [id number] Patient Account Number: [id number] Date of Birth/Sex: Treating  RN: 11/08/1945 (77 y.o. 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 - Dr. Lady Gary Room Return A July 8th 10:15 am (Room 3) Anesthetic (In clinic) Topical Lidocaine 4% applied to wound bed Bathing/ Shower/ Hygiene May shower with protection but do not get wound dressing(s) wet. Protect dressing(s) with water repellant cover (for example, large plastic bag) or a cast cover and may then take shower. Edema Control - Lymphedema / SCD / Other Elevate legs to the level of the heart or above for 30 minutes daily and/or when sitting for 3-4 times a day throughout the day. A void standing for long periods of time. Exercise regularly If compression wraps slide down please call wound center and speak with a nurse. Wound Treatment Wound #4 - Lower Leg Wound Laterality: Left, Anterior Cleanser: Soap and Water 1 x Per Week/30 Days Discharge Instructions: May shower and wash wound with dial antibacterial soap and water prior to dressing change. Cleanser: Vashe 5.8 (oz) 1 x Per Week/30 Days Discharge Instructions: Cleanse the wound with Vashe prior to applying a clean dressing using gauze sponges, not tissue or cotton balls. Prim Dressing: Hydrofera Blue Ready Transfer Foam, 4x5 (in/in) 1 x Per Week/30 Days ary Discharge Instructions: Apply to wound bed as instructed Secondary Dressing: T Non-adherent Dressing, 2x3 in 1 x Per Week/30 Days elfa Discharge Instructions: Apply over primary dressing as  directed. Secured With: Coban Self-Adherent Wrap 4x5 (in/yd) 1 x Per Week/30 Days Discharge Instructions: Secure with Coban as directed. Secured With: [employer], 4.5x3.1 (in/yd) 1 x Per Week/30 Days Discharge Instructions: Secure with cotton as directed. RASHAUN, CASSONE ([id number]) [id number] Page 6 of 13 Wound #5 - Lower Leg Wound Laterality: Left, Anterior, Proximal Cleanser: Soap and Water 1 x Per Week/30 Days Discharge Instructions: May shower and wash wound with dial antibacterial soap and water prior to dressing change. Cleanser: Vashe 5.8 (oz) 1 x Per Week/30 Days Discharge Instructions: Cleanse the wound with Vashe prior to applying a clean dressing using gauze sponges, not tissue or cotton balls. Prim Dressing: Hydrofera Blue Ready Transfer Foam, 4x5 (in/in) 1 x Per Week/30 Days ary Discharge Instructions: Apply to wound bed as instructed Secondary Dressing: T Non-adherent Dressing, 2x3 in 1 x Per Week/30 Days elfa Discharge Instructions: Apply over  primary dressing as directed. Secured With: Coban Self-Adherent Wrap 4x5 (in/yd) 1 x Per Week/30 Days Discharge Instructions: Secure with Coban as directed. Secured With: [employer], 4.5x3.1 (in/yd) 1 x Per Week/30 Days Discharge Instructions: Secure with cotton as directed. Wound #6 - Knee Wound Laterality: Left Cleanser: Soap and Water 1 x Per Day/30 Days Discharge Instructions: May shower and wash wound with dial antibacterial soap and water prior to dressing change. Cleanser: Vashe 5.8 (oz) 1 x Per Day/30 Days Discharge Instructions: Cleanse the wound with Vashe prior to applying a clean dressing using gauze sponges, not tissue or cotton balls. Prim Dressing: Hydrofera Blue Ready Transfer Foam, 2.5x2.5 (in/in) 1 x Per Day/30 Days ary Discharge Instructions: Apply directly to wound bed as directed Secondary Dressing: T Non-adherent Dressing, 2x3 in 1 x Per Day/30 Days elfa Discharge  Instructions: Apply over primary dressing as directed. Secured With: Elastic Bandage 4 inch (ACE bandage) 1 x Per Day/30 Days Discharge Instructions: Secure with ACE bandage as directed. Secured With: [employer], 4.5x3.1 (in/yd) 1 x Per Day/30 Days Discharge Instructions: Secure with cotton as directed. Patient Medications llergies: Sulfa (Sulfonamide Antibiotics), adhesive, amoxicillin, ciprofloxacin, Statins-Hmg-Coa Reductase Inhibitors A Notifications Medication Indication Start End 04/27/2023 lidocaine DOSE topical 4 % cream - cream topical Electronic Signature(s) Signed: 04/27/2023 10:28:27 AM By: Duanne Guess MD FACS Entered By: Duanne Guess on 04/27/2023 10:26:31 -------------------------------------------------------------------------------- Problem List Details Patient Name: Date of Service: [name] [name] 04/27/2023 10:00 A M Medical Record Number: [id number] Patient Account Number: [id number] Date of Birth/Sex: Treating RN: 1946/10/25 (77 y.o. 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 04/27/2023 10:00 A M Medical Record Number: [id number] Patient Account Number: [id number] Date of Birth/Sex: Treating RN: March 29, 1946 (77 y.o. F) Primary Care Provider: Dina Rich Other Clinician: Referring Provider: Treating Provider/Extender: Pincus Badder in Treatment: 13 Subjective Chief Complaint Information obtained from Patient Patient seen for complaints of Non-Healing Wound. History of Present Illness (HPI) ADMISSION 02/19/2022 This is a 77 year old woman with minimal pertinent medical history. She does have COPD and pulmonary hypertension, but is not diabetic and is not a smoker. About 6 months ago, she and her husband got a new beagle puppy. The puppy scratched her leg and the scratches ultimately deteriorated into ulcerations. Apparently she sought care with a dermatologist who recommended applying a one-to-one mixture of peroxide and water to the wounds followed by a thick layer of Vaseline. She continue this for some time but then saw her primary care provider who told her to discontinue the peroxide. She has not been on any antibiotics for the scratches. Today, there are 3 separate wounds on her anterior tibial surface on the left. They are tender and her leg has localized swelling. There is yellow slough buildup in each of the wound bases. ABI in  clinic today was normal at 1.16. 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 02/25/2022: Over the past week, the wounds themselves have not improved tremendously but she has noticed some stinging and the periwound skin is quite inflamed. I took a culture last week that had low levels of methicillin-resistant Staph aureus. Because it was fairly low level, I did not prescribe an oral antibiotic. I had planned to change her to mupirocin today, however. We have been using Santyl under Hydrofera Blue with 3 layer compression. 03/04/2022: The  wounds have improved quite a bit over the past week. They are less painful and red. She has been taking the prescribed doxycycline but says that it gives her a stomachache. We have been using mupirocin with Hydrofera Blue and 3 layer compression. 03/11/2022: Continued improvement of the wounds. They have contracted quite a bit and have a good surface of healthy granulation tissue present. There is some dried eschar present around all 3 of the lesions. 03/17/2022: No significant change in the wounds from last week. The surface is clean with just a little bit of eschar and slough accumulation. No odor or drainage. No concern for infection. 03/26/2022: All of her wounds are smaller this week. There is good granulation tissue on the surface of each. No slough accumulation. There is just some dry skin around the wounds but not actually involving the wounds. No concern for infection. 04/04/2022: The 2 proximal wounds have healed. The distal wound is much smaller and just has a little bit of dry eschar around the edges. 04/11/2022: Her wound is nearly healed. It is clean without concern for infection. GARNER, ZEPPIERI ([id number]) [id number] Page 8 of 13 04/18/2022: Her wound has healed. READMISSION 01/26/2023 About 8 weeks ago, she sustained another scratch from her Beagle on her left anterior tibial surface.. She has been trying to treat the wound at home with Vaseline. There is thick slough buildup on the wound surface and the periwound skin looks a bit macerated. No overt sign of infection. 01/29/2023: She came in for an unscheduled visit today because her wound began bleeding. It apparently bled quite profusely and her husband used an over-the- counter topical agent to get it to stop. On inspection today, he managed to achieve good hemostasis and there is no ongoing blood loss. 02/03/2023: No further issues with bleeding. There is minimal slough accumulation. Some hypertrophic granulation  tissue present. 02/11/2023: She has had a lot more drainage over the past week and there has been more breakdown of the skin around her wound. The wound itself has expanded and there is quite a bit of slough on the surfaces. 02/18/2023: Her wound looks much better this week. The drainage has decreased and the periwound skin is in much improved condition. The wound is smaller and there is just 1 tiny satellite adjacent to the main wound. There is slough accumulation on the surfaces. The culture that I took last week returned with methicillin sensitive Staph aureus. She is currently taking Keflex for this. 02/25/2023: Her wound is smaller again this week with minimal slough accumulation. The periwound looks significantly improved. She has completed her oral Keflex. 03/04/2023: The wound continues to contract. The periwound is completely healed and looks like normal intact skin. There is some slough on the surface of the wound overlying good granulation tissue. 03/11/2023: The wound measured about the same size today, although visually it appears smaller. There is a little bit of slough on the surface with good granulation tissue  underneath. 03/17/2023: The wound is a little bit smaller today with light slough on the wound surface. No erythema, induration, or significant drainage. 03/30/2023: The wound is a little bit smaller again today with minimal slough and eschar accumulation. 04/06/2023: Her skin irritation from the Zetuvit dressing has gotten worse and she has had a fair amount of skin breakdown in the distribution of the bandage. This is also caused some breakdown of the existing wound. In addition, when she was at a wedding this weekend, she fell and suffered a large skin tear to her proximal left lower leg and left knee. The fat layer is exposed. There is slough accumulation on the surfaces. 04/13/2023: All of the wounds have deteriorated. They are red and angry-looking. There is slough on all of the  surfaces. 04/20/2023: There has been significant improvement since last week. The erythema and irritation have resolved. There is minimal slough on the wound surfaces along with a little bit of light periwound eschar. She continues on Keflex. 04/27/2023: Continued improvement of her wounds. All of them are smaller. The knee wound is nearly closed. There is slough and eschar on all of the wound surfaces. She has completed her course of oral antibiotics. Patient History Information obtained from Patient, Caregiver. Family History Unknown History. Social History Former smoker, Marital Status - Married, Alcohol Use - Never, Drug Use - No History, Caffeine Use - Rarely. Medical History Eyes Patient has history of Cataracts - bil removed Denies history of Glaucoma, Optic Neuritis Ear/Nose/Mouth/Throat Denies history of Chronic sinus problems/congestion, Middle ear problems Hematologic/Lymphatic Patient has history of Anemia Respiratory Patient has history of Chronic Obstructive Pulmonary Disease (COPD) Cardiovascular Patient has history of Coronary Artery Disease, Peripheral Venous Disease - varicose veins Endocrine Denies history of Type I Diabetes, Type II Diabetes Genitourinary Denies history of End Stage Renal Disease Immunological Patient has history of Raynauds Denies history of Lupus Erythematosus, Scleroderma Integumentary (Skin) Denies history of History of Burn Musculoskeletal Patient has history of Rheumatoid Arthritis, Osteoarthritis Denies history of Gout Oncologic Denies history of Received Chemotherapy, Received Radiation Psychiatric Denies history of Anorexia/bulimia, Confinement Anxiety Hospitalization/Surgery History - bil cataract removal. - left shoiulder replacement. - right rotator cuff repair. - multiple lumbar spine surgeries. - melanoma removed left arm and chest. - partial excision bone left 2nd toe. Medical A Surgical History  Notes nd Hematologic/Lymphatic varicose vein of leg, mixed hyperlipidemia Respiratory dyspnea, chronic bronchitis, pulmonary hypertension, pulmonary emphysema Cardiovascular MARINELLE, HODGMAN ([id number]) [id number] Page 9 of 13 aortic calcification, tachycardia, left ventricular dysfunction, bradycardia Gastrointestinal Gerd Endocrine prediabetes, hypothyroidism Genitourinary bladder disorder Integumentary (Skin) contact dermatitis Musculoskeletal acquired plantar porokeratosis, neurogenic claudication d/t lumbar spinal stenosis, supraspinatus tendinitis, restless leg syndrome, spinal stenosis of lumbar region Neurologic TIA, insomnia Objective Constitutional no acute distress. Vitals Time Taken: 10:00 AM, Height: 63 in, Weight: 115 lbs, BMI: 20.4, Temperature: 98.4 F, Pulse: 99 bpm, Respiratory Rate: 16 breaths/min, Blood Pressure: 126/56 mmHg. Respiratory Normal work of breathing on room air. General Notes: 04/27/2023: Continued improvement of her wounds. All of them are smaller. The knee wound is nearly closed. There is slough and eschar on all of the wound surfaces. Integumentary (Hair, Skin) Wound #4 status is Open. Original cause of wound was Trauma. The date acquired was: 11/27/2022. The wound has been in treatment 13 weeks. The wound is located on the Left,Anterior Lower Leg. The wound measures 7.5cm length x 6cm width x 0.1cm depth; 35.343cm^2 area and 3.534cm^3 volume. There is Fat Layer (Subcutaneous Tissue) exposed. There  is no tunneling or undermining noted. There is a medium amount of serosanguineous drainage noted. The wound margin is distinct with the outline attached to the wound base. There is medium (34-66%) red, pink granulation within the wound bed. There is a medium (34-66%) amount of necrotic tissue within the wound bed including Adherent Slough. The periwound skin appearance had no abnormalities noted for moisture. The periwound skin  appearance exhibited: Hemosiderin Staining. The periwound skin appearance did not exhibit: Callus, Crepitus, Excoriation, Induration, Rash, Scarring, Atrophie Blanche, Cyanosis, Ecchymosis, Mottled, Pallor, Rubor, Erythema. Periwound temperature was noted as No Abnormality. Wound #5 status is Open. Original cause of wound was Skin T ear/Laceration. The date acquired was: 04/02/2023. The wound has been in treatment 3 weeks. The wound is located on the Left,Proximal,Anterior Lower Leg. The wound measures 3cm length x 2cm width x 0.1cm depth; 4.712cm^2 area and 0.471cm^3 volume. There is Fat Layer (Subcutaneous Tissue) exposed. There is no tunneling or undermining noted. There is a medium amount of serosanguineous drainage noted. The wound margin is distinct with the outline attached to the wound base. There is large (67-100%) red granulation within the wound bed. There is a small (1-33%) amount of necrotic tissue within the wound bed including Adherent Slough. The periwound skin appearance had no abnormalities noted for texture. The periwound skin appearance had no abnormalities noted for moisture. The periwound skin appearance exhibited: Rubor. The periwound skin appearance did not exhibit: Ecchymosis. Periwound temperature was noted as No Abnormality. Wound #6 status is Open. Original cause of wound was Skin T ear/Laceration. The date acquired was: 04/02/2023. The wound has been in treatment 3 weeks. The wound is located on the Left Knee. The wound measures 0.5cm length x 1.8cm width x 0.1cm depth; 0.707cm^2 area and 0.071cm^3 volume. There is no tunneling or undermining noted. There is a none present amount of drainage noted. The wound margin is distinct with the outline attached to the wound base. There is no granulation within the wound bed. There is no necrotic tissue within the wound bed. The periwound skin appearance had no abnormalities noted for moisture. The periwound skin appearance exhibited:  Scarring, Ecchymosis. Periwound temperature was noted as No Abnormality. 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 There was a Selective/Open Wound Non-Viable Tissue Debridement with a total area of 10.6 sq cm performed by Duanne Guess, MD. With the following instrument(s): Curette to remove Non-Viable tissue/material. Material removed includes [facility] after achieving pain control using Lidocaine 4% Topical Solution. MONTI, KLAPHAKE ([id number]) [id number] Page 10 of 13 No specimens were taken. A time out was conducted at 10:16, prior to the start of the procedure. A Minimum amount of bleeding was controlled with Pressure. The procedure was tolerated well. Post Debridement Measurements: 7.5cm length x 6cm width x 0.1cm depth; 3.534cm^3 volume. Character of Wound/Ulcer Post Debridement is improved. Severity of Tissue Post Debridement is: Fat layer exposed. Post procedure Diagnosis Wound #4: Same as Pre-Procedure General Notes: scribed for Dr. Lady Gary by Samuella Bruin, RN. Wound #5 Pre-procedure diagnosis of Wound #5 is a Skin T located on the Left,Proximal,Anterior Lower Leg . There was a Selective/Open Wound Non-Viable Tissue ear Debridement with a total area  of 4.71 sq cm performed by Duanne Guess, MD. With the following instrument(s): Curette to remove Non-Viable tissue/material. Material removed includes [facility] after achieving pain control using Lidocaine 4% Topical Solution. No specimens were taken. A time out was conducted at 10:16, prior to the start of the procedure. A Minimum  amount of bleeding was controlled with Pressure. The procedure was tolerated well. Post Debridement Measurements: 3cm length x 2cm width x 0.1cm depth; 0.471cm^3 volume. Character of Wound/Ulcer Post Debridement is improved. Post procedure Diagnosis Wound #5: Same as Pre-Procedure General Notes: scribed for Dr. Lady Gary by Samuella Bruin, RN. Wound #6 Pre-procedure diagnosis of Wound #6 is a Skin T located on the Left Knee . There was a Selective/Open Wound Non-Viable Tissue Debridement with a total ear area of 0.71 sq cm performed by Duanne Guess, MD. With the following instrument(s): Curette to remove Non-Viable tissue/material. Material removed includes Eschar and Slough and after achieving pain control using Lidocaine 4% Topical Solution. No specimens were taken. A time out was conducted at 10:16, prior to the start of the procedure. A Minimum amount of bleeding was controlled with Pressure. The procedure was tolerated well. Post Debridement Measurements: 0.5cm length x 1.8cm width x 0.1cm depth; 0.071cm^3 volume. Character of Wound/Ulcer Post Debridement is improved. Post procedure Diagnosis Wound #6: Same as Pre-Procedure General Notes: scribed for Dr. Lady Gary by Samuella Bruin, RN. Plan Follow-up Appointments: Return Appointment in 1 week. - Dr. Lady Gary Room July 8th 10:15 am (Room 3) Anesthetic: (In clinic) Topical Lidocaine 4% applied to wound bed Bathing/ Shower/ Hygiene: May shower with protection but do not get wound dressing(s) wet. Protect dressing(s) with water repellant cover (for example, large plastic bag) or a cast cover and may then take shower. Edema Control - Lymphedema / SCD / Other: Elevate legs to the level of the heart or above for 30 minutes daily and/or when sitting for 3-4 times a day throughout the day. Avoid standing for long periods of time. Exercise regularly If compression wraps slide down please call wound center and speak with a nurse. The  following medication(s) was prescribed: lidocaine topical 4 % cream cream topical was prescribed at facility WOUND #4: - Lower Leg Wound Laterality: Left, Anterior Cleanser: Soap and Water 1 x Per Week/30 Days Discharge Instructions: May shower and wash wound with dial antibacterial soap and water prior to dressing change. Cleanser: Vashe 5.8 (oz) 1 x Per Week/30 Days Discharge Instructions: Cleanse the wound with Vashe prior to applying a clean dressing using gauze sponges, not tissue or cotton balls. Prim Dressing: Hydrofera Blue Ready Transfer Foam, 4x5 (in/in) 1 x Per Week/30 Days ary Discharge Instructions: Apply to wound bed as instructed Secondary Dressing: T Non-adherent Dressing, 2x3 in 1 x Per Week/30 Days elfa Discharge Instructions: Apply over primary dressing as directed. Secured With: Coban Self-Adherent Wrap 4x5 (in/yd) 1 x Per Week/30 Days Discharge Instructions: Secure with Coban as directed. Secured With: [employer], 4.5x3.1 (in/yd) 1 x Per Week/30 Days Discharge Instructions: Secure with cotton as directed. WOUND #5: - Lower Leg Wound Laterality: Left, Anterior, Proximal Cleanser: Soap and Water 1 x Per Week/30 Days Discharge Instructions: May shower and wash wound with dial antibacterial soap and water prior to dressing change. Cleanser: Vashe 5.8 (oz) 1 x Per Week/30 Days Discharge Instructions: Cleanse the wound with Vashe prior to applying a clean dressing using gauze sponges, not tissue or cotton balls. Prim Dressing: Hydrofera Blue Ready Transfer Foam, 4x5 (in/in) 1 x Per Week/30 Days ary Discharge  Instructions: Apply to wound bed as instructed Secondary Dressing: T Non-adherent Dressing, 2x3 in 1 x Per Week/30 Days elfa Discharge Instructions: Apply over primary dressing as directed. Secured With: Coban Self-Adherent Wrap 4x5 (in/yd) 1 x Per Week/30 Days Discharge Instructions: Secure with Coban as directed. Secured With: [employer], 4.5x3.1  (in/yd) 1 x Per Week/30 Days Discharge Instructions: Secure with cotton as directed. WOUND #6: - Knee Wound Laterality: Left Cleanser: Soap and Water 1 x Per Day/30 Days Discharge Instructions: May shower and wash wound with dial antibacterial soap and water prior to dressing change. Cleanser: Vashe 5.8 (oz) 1 x Per Day/30 Days Discharge Instructions: Cleanse the wound with Vashe prior to applying a clean dressing using gauze sponges, not tissue or cotton balls. Prim Dressing: Hydrofera Blue Ready Transfer Foam, 2.5x2.5 (in/in) 1 x Per Day/30 Days ary Discharge Instructions: Apply directly to wound bed as directed Secondary Dressing: T Non-adherent Dressing, 2x3 in 1 x Per Day/30 Days elfa Discharge Instructions: Apply over primary dressing as directed. Secured With: Elastic Bandage 4 inch (ACE bandage) 1 x Per Day/30 Days Discharge Instructions: Secure with ACE bandage as directed. Secured With: [employer], 4.5x3.1 (in/yd) 1 x Per Day/30 Days Discharge Instructions: Secure with cotton as directed. 04/27/2023: Continued improvement of her wounds. All of them are smaller. The knee wound is nearly closed. There is slough and eschar on all of the wound surfaces. JOLIE, BRANDOW ([id number]) [id number] Page 11 of 13 I used a curette to debride slough and eschar from her wounds. We will continue Hydrofera Blue ready foam with cotton and Coban wrapping. Follow-up in 1 week. Electronic Signature(s) Signed: 04/27/2023 10:27:33 AM By: Duanne Guess MD FACS Entered By: Duanne Guess on 04/27/2023 10:27:33 -------------------------------------------------------------------------------- HxROS Details Patient Name: Date of Service: Eisler, [name] 04/27/2023 10:00 A M Medical Record Number: [id number] Patient Account Number: [id number] Date of Birth/Sex: Treating RN: June 05, 1946 (77 y.o. F) Primary Care Provider: Dina Rich Other Clinician: Referring  Provider: Treating Provider/Extender: Pincus Badder in Treatment: 13 Information Obtained From Patient Caregiver Eyes Medical History: Positive for: Cataracts - bil removed Negative for: Glaucoma; Optic Neuritis Ear/Nose/Mouth/Throat Medical History: Negative for: Chronic sinus problems/congestion; Middle ear problems Hematologic/Lymphatic Medical History: Positive for: Anemia Past Medical History Notes: varicose vein of leg, mixed hyperlipidemia Respiratory Medical History: Positive for: Chronic Obstructive Pulmonary Disease (COPD) Past Medical History Notes: dyspnea, chronic bronchitis, pulmonary hypertension, pulmonary emphysema Cardiovascular Medical History: Positive for: Coronary Artery Disease; Peripheral Venous Disease - varicose veins Past Medical History Notes: aortic calcification, tachycardia, left ventricular dysfunction, bradycardia Gastrointestinal Medical History: Past Medical History Notes: Gerd Endocrine Medical History: Negative for: Type I Diabetes; Type II Diabetes Past Medical History Notes: prediabetes, hypothyroidism Genitourinary Medical History: Negative for: End Stage Renal Disease SHIQUITA, HOUSEHOLDER ([id number]) [id number] Page 12 of 13 Past Medical History Notes: bladder disorder Immunological Medical History: Positive for: Raynauds Negative for: Lupus Erythematosus; Scleroderma Integumentary (Skin) Medical History: Negative for: History of Burn Past Medical History Notes: contact dermatitis Musculoskeletal Medical History: Positive for: Rheumatoid Arthritis; Osteoarthritis Negative for: Gout Past Medical History Notes: acquired plantar porokeratosis, neurogenic claudication d/t lumbar spinal stenosis, supraspinatus tendinitis, restless leg syndrome, spinal stenosis of lumbar region Neurologic Medical History: Past Medical History Notes: TIA, insomnia Oncologic Medical  History: Negative for: Received Chemotherapy; Received Radiation Psychiatric Medical History: Negative for: Anorexia/bulimia; Confinement Anxiety HBO Extended History Items Eyes: Cataracts Immunizations Pneumococcal Vaccine: Received Pneumococcal Vaccination: Yes Received Pneumococcal Vaccination On or After 60th Birthday:  Yes Implantable Devices None Hospitalization / Surgery History Type of Hospitalization/Surgery bil cataract removal left shoiulder replacement right rotator cuff repair multiple lumbar spine surgeries melanoma removed left arm and chest partial excision bone left 2nd toe Family and Social History Unknown History: Yes; Former smoker; Marital Status - Married; Alcohol Use: Never; Drug Use: No History; Caffeine Use: Rarely; Financial Concerns: No; Food, Clothing or Shelter Needs: No; Support System Lacking: No; Transportation Concerns: No Electronic Signature(s) Signed: 04/27/2023 10:28:27 AM By: Duanne Guess MD FACS Entered By: Duanne Guess on 04/27/2023 10:25:33 [name] ([id number]) [id number] Page 13 of 13 -------------------------------------------------------------------------------- SuperBill Details Patient Name: Date of Service: SHOSHANNA, CORBAN [name] 04/27/2023 Medical Record Number: [id number] Patient Account Number: [id number] Date of Birth/Sex: Treating RN: [birth year] (77 y.o. F) Primary Care Provider: Dina Rich Other Clinician: Referring Provider: Treating Provider/Extender: Pincus Badder in Treatment: 13 Diagnosis Coding ICD-10 Codes Code Description [phone] Non-pressure chronic ulcer of other part of left lower leg with fat layer exposed L97.122 Non-pressure chronic ulcer of left thigh with fat layer exposed I27.20 Pulmonary hypertension, unspecified I51.9 Heart disease, unspecified Facility Procedures : CPT4 Code: [id number] Description: 97597 - DEBRIDE WOUND 1ST 20 SQ CM OR <  ICD-10 Diagnosis Description L97.822 Non-pressure chronic ulcer of other part of left lower leg with fat layer expose L97.122 Non-pressure chronic ulcer of left thigh with fat layer exposed Modifier: d Quantity: 1 Physician Procedures : CPT4 Code Description Modifier [id number] - WC PHYS LEVEL 3 - EST PT 25 ICD-10 Diagnosis Description L97.822 Non-pressure chronic ulcer of other part of left lower leg with fat layer exposed L97.122 Non-pressure chronic ulcer of left thigh with fat  layer exposed I27.20 Pulmonary hypertension, unspecified I51.9 Heart disease, unspecified Quantity: 1 : [id number] 97597 - WC PHYS DEBR WO ANESTH 20 SQ CM ICD-10 Diagnosis Description L97.822 Non-pressure chronic ulcer of other part of left lower leg with fat layer exposed L97.122 Non-pressure chronic ulcer of left thigh with fat layer exposed Quantity: 1 Electronic Signature(s) Signed: 04/27/2023 10:27:50 AM By: Duanne Guess MD FACS Entered By: Duanne Guess on 04/27/2023 10:27:50

## 2023-04-27 NOTE — Progress Notes (Signed)
[name] ([id number]) [id number] Page 1 of 10 Visit Report for 04/27/2023 Arrival Information Details Patient Name: Date of Service: [name], Elaine RMA Long. 04/27/2023 10:00 A M Medical Record Number: [id number] Patient Account Number: [id number] Date of Birth/Sex: Treating RN: 06-Feb-1946 (77 y.o. [name] Primary Care [name]: [name] Other Clinician: Referring [name]: Treating [name]/Extender: [name] in Treatment: 13 Visit Information History Since Last Visit Added or deleted any medications: No Patient Arrived: [name] Any new allergies or adverse reactions: No Arrival Time: 09:59 Had a fall or experienced change in No Accompanied By: husband activities of daily living that may affect Transfer Assistance: None risk of falls: Patient Identification Verified: Yes Signs or symptoms of abuse/neglect since last visito No Secondary Verification Process Completed: Yes Hospitalized since last visit: No Patient Requires Transmission-Based Precautions: No Implantable device outside of the clinic excluding No Patient Has Alerts: Yes cellular tissue based products placed in the center Patient Alerts: ABI L 1.16 02/19/22 since last visit: Has Dressing in Place as Prescribed: Yes Has Compression in Place as Prescribed: Yes Pain Present Now: No Electronic Signature(s) Signed: 04/27/2023 3:44:30 PM By: Samuella Bruin Entered By: Samuella Bruin on 04/27/2023 10:00:12 -------------------------------------------------------------------------------- Encounter Discharge Information Details Patient Name: Date of Service: [name] RMA Long. 04/27/2023 10:00 A M Medical Record Number: [id number] Patient Account Number: [id number] Date of Birth/Sex: Treating RN: 26-Jul-1946 (77 y.o. [name] Primary Care [name]: [name] Other Clinician: Referring [name]: Treating [name]/Extender: [name] in Treatment: 13 Encounter Discharge Information Items Post Procedure Vitals Discharge Condition: Stable Temperature (F): 98.4 Ambulatory Status: Cane Pulse (bpm): 99 Discharge Destination: Home Respiratory Rate (breaths/min): 16 Transportation: Private Auto Blood Pressure (mmHg): 126/56 Accompanied By: husband Schedule Follow-up Appointment: Yes Clinical Summary of Care: Patient Declined Electronic Signature(s) Signed: 04/27/2023 3:44:30 PM By: Samuella Bruin Entered By: Samuella Bruin on 04/27/2023 10:34:45 Fraser, [name] ([id number]) [id number] Page 2 of 10 -------------------------------------------------------------------------------- Lower Extremity Assessment Details Patient Name: Date of Service: MARLANA, RATLEDGE RMA Long. 04/27/2023 10:00 A M Medical Record Number: [id number] Patient Account Number: [id number] Date of Birth/Sex: Treating RN: 1946/09/04 (78 y.o. [name] Primary Care Shandora Koogler: [name] Other Clinician: Referring Jaquelin Meaney: Treating [name]/Extender: [name] in Treatment: 13 Edema Assessment Assessed: [name]: No] [name]: No] Edema: [Left: N] [Right: o] Calf Left: Right: Point of Measurement: 25 cm From Medial Instep 30 cm Ankle Left: Right: Point of Measurement: 10 cm From Medial Instep 17.8 cm Vascular Assessment Pulses: Dorsalis Pedis Palpable: [Left:Yes] Electronic Signature(s) Signed: 04/27/2023 3:44:30 PM By: Samuella Bruin Entered By: Samuella Bruin on 04/27/2023 10:07:23 -------------------------------------------------------------------------------- Multi Wound Chart Details Patient Name: Date of Service: [name] RMA Long. 04/27/2023 10:00 A M Medical Record Number: [id number] Patient Account Number: [id number] Date of Birth/Sex: Treating RN: 07-Aug-1946 (77 y.o. F) Primary Care [name]: [name] Other Clinician: Referring [name]: Treating  [name]/Extender: [name] in Treatment: 13 Vital Signs Height(in): 63 Pulse(bpm): 99 Weight(lbs): 115 Blood Pressure(mmHg): 126/56 Body Mass Index(BMI): 20.4 Temperature(F): 98.4 Respiratory Rate(breaths/min): 16 [4:Photos:] [id number] Page 3 of 10] Left, Anterior Lower Leg Left, Proximal, Anterior Lower Leg Left Knee Wound Location: Trauma Skin T ear/Laceration Skin T ear/Laceration Wounding Event: Venous Leg Ulcer Skin T ear Skin T ear Primary Etiology: Cataracts, Anemia, Chronic Cataracts, Anemia, Chronic Cataracts, Anemia, Chronic Comorbid History: Obstructive Pulmonary Disease Obstructive Pulmonary Disease Obstructive Pulmonary Disease (COPD), Coronary Artery Disease, (COPD), Coronary Artery Disease, (COPD), Coronary Artery Disease, Peripheral Venous  Disease, Peripheral Venous Disease, Peripheral Venous Disease, Raynauds, Rheumatoid Arthritis, Raynauds, Rheumatoid Arthritis, Raynauds, Rheumatoid Arthritis, Osteoarthritis Osteoarthritis Osteoarthritis 11/27/2022 04/02/2023 04/02/2023 Date Acquired: 13 3 3  Weeks of Treatment: Open Open Open Wound Status: No No No Wound Recurrence: Yes No No Clustered Wound: 7.5x6x0.1 3x2x0.1 0.5x1.8x0.1 Measurements L x W x D (cm) 35.343 4.712 0.707 A (cm) : rea 3.534 0.471 0.071 Volume (cm) : -305.40% 83.30% 91.20% % Reduction in A rea: -305.30% 83.30% 91.10% % Reduction in Volume: Full Thickness Without Exposed Full Thickness Without Exposed Full Thickness Without Exposed Classification: Support Structures Support Structures Support Structures Medium Medium None Present Exudate A mount: Serosanguineous Serosanguineous N/A Exudate Type: red, brown red, brown N/A Exudate Color: Distinct, outline attached Distinct, outline attached Distinct, outline attached Wound Margin: Medium (34-66%) Large (67-100%) None Present (0%) Granulation A mount: Red, Pink Red  N/A Granulation Quality: Medium (34-66%) Small (1-33%) None Present (0%) Necrotic A mount: Fat Layer (Subcutaneous Tissue): Yes Fat Layer (Subcutaneous Tissue): Yes Fascia: No Exposed Structures: Fascia: No Fascia: No Fat Layer (Subcutaneous Tissue): No Tendon: No Tendon: No Tendon: No Muscle: No Muscle: No Muscle: No Joint: No Joint: No Joint: No Bone: No Bone: No Bone: No Small (1-33%) Small (1-33%) None Epithelialization: Debridement - Selective/Open Wound Debridement - Selective/Open Wound Debridement - Selective/Open Wound Debridement: Pre-procedure Verification/Time Out 10:16 10:16 10:16 Taken: Lidocaine 4% Topical Solution Lidocaine 4% Topical Solution Lidocaine 4% Topical Solution Pain Control: Brewing technologist, [employer] Tissue Debrided: Non-Viable Tissue Non-Viable Tissue Non-Viable Tissue Level: 10.6 4.71 0.71 Debridement A (sq cm): rea Curette Curette Curette Instrument: Minimum Minimum Minimum Bleeding: Pressure Pressure Pressure Hemostasis A chieved: Procedure was tolerated well Procedure was tolerated well Procedure was tolerated well Debridement Treatment Response: 7.5x6x0.1 3x2x0.1 0.5x1.8x0.1 Post Debridement Measurements L x W x D (cm) 3.534 0.471 0.071 Post Debridement Volume: (cm) Excoriation: No No Abnormalities Noted Scarring: Yes Periwound Skin Texture: Induration: No Callus: No Crepitus: No Rash: No Scarring: No Maceration: Yes No Abnormalities Noted No Abnormalities Noted Periwound Skin Moisture: Dry/Scaly: No Hemosiderin Staining: Yes Rubor: Yes Ecchymosis: Yes Periwound Skin Color: Atrophie Blanche: No Ecchymosis: No Cyanosis: No Ecchymosis: No Erythema: No Mottled: No Pallor: No Rubor: No No Abnormality No Abnormality No Abnormality Temperature: Debridement Debridement Debridement Procedures Performed: Treatment Notes Electronic Signature(s) Signed: 04/27/2023 10:24:01 AM By: Duanne Guess MD  FACS Entered By: Duanne Guess on 04/27/2023 10:24:01 [name] ([id number]) [id number] Page 4 of 10 -------------------------------------------------------------------------------- Multi-Disciplinary Care Plan Details Patient Name: Date of Service: [name], [name] RMA Long. 04/27/2023 10:00 A M Medical Record Number: [id number] Patient Account Number: [id number] Date of Birth/Sex: Treating RN: Feb 09, 1946 (77 y.o. [name] Primary Care Tamaira Ciriello: [name] Other Clinician: Referring Lakyla Biswas: Treating Samanvitha [place]/Extender: [name] in Treatment: 13 Active Inactive Nutrition Nursing Diagnoses: Potential for alteratiion in Nutrition/Potential for imbalanced nutrition Goals: Patient/caregiver agrees to and verbalizes understanding of need to obtain nutritional consultation Date Initiated: 01/26/2023 Target Resolution Date: 05/29/2023 Goal Status: Active Patient/caregiver will maintain therapeutic glucose control Date Initiated: 01/26/2023 Target Resolution Date: 05/29/2023 Goal Status: Active Interventions: Assess patient nutrition upon admission and as needed per policy Provide education on nutrition Treatment Activities: Giving encouragement to exercise : 01/26/2023 Notes: Pain, Acute or Chronic Nursing Diagnoses: Pain, acute or chronic: actual or potential Potential alteration in comfort, pain Goals: Patient will verbalize adequate pain control and receive pain control interventions during procedures as needed Date Initiated: 01/26/2023 Target Resolution Date: 05/29/2023 Goal Status: Active Patient/caregiver will verbalize comfort level met Date  Initiated: 01/26/2023 Target Resolution Date: 05/29/2023 Goal Status: Active Interventions: Complete pain assessment as per visit requirements Encourage patient to take pain medications as prescribed Provide education on pain management Treatment Activities: Administer pain control  measures as ordered : 01/26/2023 Notes: Electronic Signature(s) Signed: 04/27/2023 3:44:30 PM By: Samuella Bruin Entered By: Samuella Bruin on 04/27/2023 10:19:04 -------------------------------------------------------------------------------- Pain Assessment Details Patient Name: Date of Service: [name] RMA Long. 04/27/2023 10:00 A M Medical Record Number: [id number] Patient Account Number: [id number] Date of Birth/Sex: Treating RN: 1946/03/17 (77 y.o. Terriann, [place], [place] Long ([id number]) [id number] Page 5 of 10 Primary Care Tyke Outman: [name] Other Clinician: Referring Daelynn Blower: Treating Myasia Sinatra/Extender: [name] in Treatment: 13 Active Problems Location of Pain Severity and Description of Pain Patient Has Paino No Site Locations Rate the pain. Current Pain Level: 0 Pain Management and Medication Current Pain Management: Electronic Signature(s) Signed: 04/27/2023 3:44:30 PM By: Samuella Bruin Entered By: Samuella Bruin on 04/27/2023 10:00:26 -------------------------------------------------------------------------------- Patient/Caregiver Education Details Patient Name: Date of Service: [name] RMA Long. 7/1/2024andnbsp10:00 A M Medical Record Number: [id number] Patient Account Number: [id number] Date of Birth/Gender: Treating RN: [birth year] (77 y.o. [name] Primary Care Physician: [name] Other Clinician: Referring Physician: Treating Physician/Extender: [name] in Treatment: 13 Education Assessment Education Provided To: Patient Education Topics Provided Wound/Skin Impairment: Methods: Explain/Verbal Responses: Reinforcements needed, State content correctly Electronic Signature(s) Signed: 04/27/2023 3:44:30 PM By: Samuella Bruin Entered By: Samuella Bruin on 04/27/2023 10:19:17 [name] ([id number])  [id number] Page 6 of 10 -------------------------------------------------------------------------------- Wound Assessment Details Patient Name: Date of Service: [name], JAGGER RMA Long. 04/27/2023 10:00 A M Medical Record Number: [id number] Patient Account Number: [id number] Date of Birth/Sex: Treating RN: 1946-08-12 (77 y.o. [name] Primary Care Jenny Lai: [name] Other Clinician: Referring Kenslie Abbruzzese: Treating Aijalon Demuro/Extender: [name] in Treatment: 13 Wound Status Wound Number: 4 Primary Venous Leg Ulcer Etiology: Wound Location: Left, Anterior Lower Leg Wound Open Wounding Event: Trauma Status: Date Acquired: 11/27/2022 Comorbid Cataracts, Anemia, Chronic Obstructive Pulmonary Disease Weeks Of Treatment: 13 History: (COPD), Coronary Artery Disease, Peripheral Venous Disease, Clustered Wound: Yes Raynauds, Rheumatoid Arthritis, Osteoarthritis Photos Wound Measurements Length: (cm) 7.5 Width: (cm) 6 Depth: (cm) 0.1 Area: (cm) 35.343 Volume: (cm) 3.534 % Reduction in Area: -305.4% % Reduction in Volume: -305.3% Epithelialization: Small (1-33%) Tunneling: No Undermining: No Wound Description Classification: Full Thickness Without Exposed Suppor Wound Margin: Distinct, outline attached Exudate Amount: Medium Exudate Type: Serosanguineous Exudate Color: red, brown t Structures Foul Odor After Cleansing: No Slough/Fibrino Yes Wound Bed Granulation Amount: Medium (34-66%) Exposed Structure Granulation Quality: Red, Pink Fascia Exposed: No Necrotic Amount: Medium (34-66%) Fat Layer (Subcutaneous Tissue) Exposed: Yes Necrotic Quality: Adherent Slough Tendon Exposed: No Muscle Exposed: No Joint Exposed: No Bone Exposed: No Periwound Skin Texture Texture Color No Abnormalities Noted: No No Abnormalities Noted: No Callus: No Atrophie Blanche: No Crepitus: No Cyanosis: No Excoriation: No Ecchymosis:  No Induration: No Erythema: No Rash: No Hemosiderin Staining: Yes Scarring: No Mottled: No Pallor: No Moisture Rubor: No No Abnormalities Noted: Yes Temperature / Pain Temperature: No Abnormality Sharpnack, Damien Long ([id number]) [id number] Page 7 of 10 Treatment Notes Wound #4 (Lower Leg) Wound Laterality: Left, Anterior Cleanser Soap and Water Discharge Instruction: May shower and wash wound with dial antibacterial soap and water prior to dressing change. Vashe 5.8 (oz) Discharge Instruction: Cleanse the wound with Vashe prior to applying a clean dressing using gauze sponges, not tissue or cotton balls.  Peri-Wound Care Topical Primary Dressing Hydrofera Blue Ready Transfer Foam, 4x5 (in/in) Discharge Instruction: Apply to wound bed as instructed Secondary Dressing T Non-adherent Dressing, 2x3 in elfa Discharge Instruction: Apply over primary dressing as directed. Secured With [employer] 4x5 (in/yd) Discharge Instruction: Secure with Coban as directed. Kerlix Roll Sterile, 4.5x3.1 (in/yd) Discharge Instruction: Secure with cotton as directed. Compression Wrap Compression Stockings Add-Ons Electronic Signature(s) Signed: 04/27/2023 3:44:30 PM By: Samuella Bruin Entered By: Samuella Bruin on 04/27/2023 10:11:36 -------------------------------------------------------------------------------- Wound Assessment Details Patient Name: Date of Service: [name] RMA Long. 04/27/2023 10:00 A M Medical Record Number: [id number] Patient Account Number: [id number] Date of Birth/Sex: Treating RN: 1946-09-17 (77 y.o. [name] Primary Care Yareli Carthen: [name] Other Clinician: Referring Deandrea Vanpelt: Treating Takira Sherrin/Extender: [name] in Treatment: 13 Wound Status Wound Number: 5 Primary Skin [name] Etiology: Wound Location: Left, Proximal, Anterior Lower Leg Wound Open Wounding Event: Skin  [name]/Laceration Status: Date Acquired: 04/02/2023 Comorbid Cataracts, Anemia, Chronic Obstructive Pulmonary Disease Weeks Of Treatment: 3 History: (COPD), Coronary Artery Disease, Peripheral Venous Disease, Clustered Wound: No Raynauds, Rheumatoid Arthritis, Osteoarthritis Photos SELENI, [name] ([id number]) [id number] Page 8 of 10 Wound Measurements Length: (cm) 3 Width: (cm) 2 Depth: (cm) 0.1 Area: (cm) 4.712 Volume: (cm) 0.471 % Reduction in Area: 83.3% % Reduction in Volume: 83.3% Epithelialization: Small (1-33%) Tunneling: No Undermining: No Wound Description Classification: Full Thickness Without Exposed Support Structures Wound Margin: Distinct, outline attached Exudate Amount: Medium Exudate Type: Serosanguineous Exudate Color: red, brown Foul Odor After Cleansing: No Slough/Fibrino Yes Wound Bed Granulation Amount: Large (67-100%) Exposed Structure Granulation Quality: Red Fascia Exposed: No Necrotic Amount: Small (1-33%) Fat Layer (Subcutaneous Tissue) Exposed: Yes Necrotic Quality: Adherent Slough Tendon Exposed: No Muscle Exposed: No Joint Exposed: No Bone Exposed: No Periwound Skin Texture Texture Color No Abnormalities Noted: Yes No Abnormalities Noted: No Ecchymosis: No Moisture Rubor: Yes No Abnormalities Noted: Yes Temperature / Pain Temperature: No Abnormality Treatment Notes Wound #5 (Lower Leg) Wound Laterality: Left, Anterior, Proximal Cleanser Soap and Water Discharge Instruction: May shower and wash wound with dial antibacterial soap and water prior to dressing change. Vashe 5.8 (oz) Discharge Instruction: Cleanse the wound with Vashe prior to applying a clean dressing using gauze sponges, not tissue or cotton balls. Peri-Wound Care Topical Primary Dressing Hydrofera Blue Ready Transfer Foam, 4x5 (in/in) Discharge Instruction: Apply to wound bed as instructed Secondary Dressing T Non-adherent Dressing, 2x3  in elfa Discharge Instruction: Apply over primary dressing as directed. Secured With [employer] 4x5 (in/yd) Discharge Instruction: Secure with Coban as directed. Kerlix Roll Sterile, 4.5x3.1 (in/yd) Discharge Instruction: Secure with cotton as directed. Compression [name], [name] ([id number]) [id number] Page 9 of 10 Compression Stockings Add-Ons Electronic Signature(s) Signed: 04/27/2023 3:44:30 PM By: Samuella Bruin Entered By: Samuella Bruin on 04/27/2023 10:12:02 -------------------------------------------------------------------------------- Wound Assessment Details Patient Name: Date of Service: [name] RMA Long. 04/27/2023 10:00 A M Medical Record Number: [id number] Patient Account Number: [id number] Date of Birth/Sex: Treating RN: [birth year] (77 y.o. [name] Primary Care [name]: [name] Other Clinician: Referring Merrianne Mccumbers: Treating Bralon Antkowiak/Extender: [name] in Treatment: 13 Wound Status Wound Number: 6 Primary Skin [name] Etiology: Wound Location: Left Knee Wound Open Wounding Event: Skin [name]/Laceration Status: Date Acquired: 04/02/2023 Comorbid Cataracts, Anemia, Chronic Obstructive Pulmonary Disease Weeks Of Treatment: 3 History: (COPD), Coronary Artery Disease, Peripheral Venous Disease, Clustered Wound: No Raynauds, Rheumatoid Arthritis, Osteoarthritis Photos Wound Measurements Length: (cm) 0.5 Width: (cm) 1.8 Depth: (cm) 0.1 Area: (  cm) 0.707 Volume: (cm) 0.071 % Reduction in Area: 91.2% % Reduction in Volume: 91.1% Epithelialization: None Tunneling: No Undermining: No Wound Description Classification: Full Thickness Without Exposed Suppor Wound Margin: Distinct, outline attached Exudate Amount: None Present t Structures Foul Odor After Cleansing: No Slough/Fibrino No Wound Bed Granulation Amount: None Present (0%) Exposed Structure Necrotic Amount: None  Present (0%) Fascia Exposed: No Fat Layer (Subcutaneous Tissue) Exposed: No Tendon Exposed: No Muscle Exposed: No Joint Exposed: No Bone Exposed: No Periwound Skin Texture Texture Color No Abnormalities Noted: No No Abnormalities Noted: No Scarring: Yes Ecchymosis: Yes Moisture Temperature / Pain [name], [name] ([id number]) [id number] Page 10 of 10 No Abnormalities Noted: Yes Temperature: No Abnormality Treatment Notes Wound #6 (Knee) Wound Laterality: Left Cleanser Soap and Water Discharge Instruction: May shower and wash wound with dial antibacterial soap and water prior to dressing change. Vashe 5.8 (oz) Discharge Instruction: Cleanse the wound with Vashe prior to applying a clean dressing using gauze sponges, not tissue or cotton balls. Peri-Wound Care Topical Primary Dressing Hydrofera Blue Ready Transfer Foam, 2.5x2.5 (in/in) Discharge Instruction: Apply directly to wound bed as directed Secondary Dressing T Non-adherent Dressing, 2x3 in elfa Discharge Instruction: Apply over primary dressing as directed. Secured With Elastic Bandage 4 inch (ACE bandage) Discharge Instruction: Secure with ACE bandage as directed. Kerlix Roll Sterile, 4.5x3.1 (in/yd) Discharge Instruction: Secure with cotton as directed. Compression Wrap Compression Stockings Add-Ons Electronic Signature(s) Signed: 04/27/2023 3:44:30 PM By: Samuella Bruin Entered By: Samuella Bruin on 04/27/2023 10:20:38 -------------------------------------------------------------------------------- Vitals Details Patient Name: Date of Service: [name] RMA Long. 04/27/2023 10:00 A M Medical Record Number: [id number] Patient Account Number: [id number] Date of Birth/Sex: Treating RN: July 28, 1946 (77 y.o. [name] Primary Care Reva Pinkley: [name] Other Clinician: Referring Deshun Sedivy: Treating Kaelem Brach/Extender: [name] in Treatment:  13 Vital Signs Time Taken: 10:00 Temperature (F): 98.4 Height (in): 63 Pulse (bpm): 99 Weight (lbs): 115 Respiratory Rate (breaths/min): 16 Body Mass Index (BMI): 20.4 Blood Pressure (mmHg): 126/56 Reference Range: 80 - 120 mg / dl Electronic Signature(s) Signed: 04/27/2023 3:44:30 PM By: Samuella Bruin Entered By: Samuella Bruin on 04/27/2023 10:00:44

## 2023-05-04 ENCOUNTER — Encounter (HOSPITAL_BASED_OUTPATIENT_CLINIC_OR_DEPARTMENT_OTHER): Payer: PPO | Admitting: General Surgery

## 2023-05-04 DIAGNOSIS — L97822 Non-pressure chronic ulcer of other part of left lower leg with fat layer exposed: Secondary | ICD-10-CM | POA: Diagnosis not present

## 2023-05-05 NOTE — Progress Notes (Signed)
[name], [name] ([id number]) [id number] Page 1 of 10 Visit Report for 05/04/2023 Arrival Information Details Patient Name: Date of Service: [name], [name] [name] 05/04/2023 10:15 A M Medical Record Number: [id number] Patient Account Number: [id number] Date of Birth/Sex: Treating RN: [birth year] (77 y.o. F) Primary Care [name]: [name] Other Clinician: Referring [name]: Treating [name]/Extender: [name] in Treatment: 14 Visit Information History Since Last Visit All ordered tests and consults were completed: No Patient Arrived: [name] Added or deleted any medications: No Arrival Time: 10:13 Any new allergies or adverse reactions: No Accompanied By: husband Had a fall or experienced change in No Transfer Assistance: None activities of daily living that may affect Patient Identification Verified: Yes risk of falls: Secondary Verification Process Completed: Yes Signs or symptoms of abuse/neglect since last visito No Patient Requires Transmission-Based Precautions: No Hospitalized since last visit: No Patient Has Alerts: Yes Implantable device outside of the clinic excluding No Patient Alerts: ABI L 1.16 02/19/22 cellular tissue based products placed in the center since last visit: Pain Present Now: No Electronic Signature(s) Signed: 05/04/2023 3:58:16 PM By: [name] Entered By: [name] on 05/04/2023 10:13:50 -------------------------------------------------------------------------------- Encounter Discharge Information Details Patient Name: Date of Service: [name] [name] 05/04/2023 10:15 A M Medical Record Number: [id number] Patient Account Number: [id number] Date of Birth/Sex: Treating RN: June 20, 1946 (77 y.o. Katrinka Blazing Primary Care Willem Klingensmith: [name] Other Clinician: Referring [name]: Treating Julyan [name]/Extender: [name] in Treatment: 14 Encounter Discharge Information Items  Post Procedure Vitals Discharge Condition: Stable Temperature (F): 98.4 Ambulatory Status: Ambulatory Pulse (bpm): 97 Discharge Destination: Home Respiratory Rate (breaths/min): 18 Transportation: Private Auto Blood Pressure (mmHg): 136/72 Accompanied By: self Schedule Follow-up Appointment: Yes Clinical Summary of Care: Patient Declined Electronic Signature(s) Signed: 05/05/2023 11:32:43 AM By: Elaine Schwalbe RN Entered By: [name] on 05/04/2023 17:46:27 [name], [name] ([id number]) [id number] Page 2 of 10 -------------------------------------------------------------------------------- Lower Extremity Assessment Details Patient Name: Date of Service: [name] [name] 05/04/2023 10:15 A M Medical Record Number: [id number] Patient Account Number: [id number] Date of Birth/Sex: Treating RN: [birth year] (77 y.o. Katrinka Blazing Primary Care Ami Thornsberry: [name] Other Clinician: Referring Abigaelle Verley: Treating Steele Stracener/Extender: [name] in Treatment: 14 Edema Assessment Assessed: [name]: No] [name]: No] Edema: [Left: N] [Right: o] Calf Left: Right: Point of Measurement: 25 cm From Medial Instep 30 cm Ankle Left: Right: Point of Measurement: 10 cm From Medial Instep 17.8 cm Vascular Assessment Pulses: Dorsalis Pedis Palpable: [Left:Yes] Electronic Signature(s) Signed: 05/05/2023 11:32:43 AM By: Elaine Schwalbe RN Entered By: [name] on 05/04/2023 10:25:24 -------------------------------------------------------------------------------- Multi Wound Chart Details Patient Name: Date of Service: [name] [name] 05/04/2023 10:15 A M Medical Record Number: [id number] Patient Account Number: [id number] Date of Birth/Sex: Treating RN: 28-Jun-1946 (77 y.o. F) Primary Care Anaika Santillano: [name] Other Clinician: Referring Kavon Valenza: Treating Kamira Mellette/Extender: [name] in Treatment: 14 Vital  Signs Height(in): 63 Pulse(bpm): 97 Weight(lbs): 115 Blood Pressure(mmHg): 136/72 Body Mass Index(BMI): 20.4 Temperature(F): 98.4 Respiratory Rate(breaths/min): 18 [4:Photos:] [[id number] Page 3 of 10] Left, Anterior Lower Leg Left, Proximal, Anterior Lower Leg Left Knee Wound Location: Trauma Skin T ear/Laceration Skin T ear/Laceration Wounding Event: Venous Leg Ulcer Skin T ear Skin T ear Primary Etiology: Cataracts, Anemia, Chronic Cataracts, Anemia, Chronic Cataracts, Anemia, Chronic Comorbid History: Obstructive Pulmonary Disease Obstructive Pulmonary Disease Obstructive Pulmonary Disease (COPD), Coronary Artery Disease, (COPD), Coronary Artery Disease, (COPD), Coronary Artery Disease, Peripheral Venous Disease, Peripheral Venous Disease, Peripheral Venous  Disease, Raynauds, Rheumatoid Arthritis, Raynauds, Rheumatoid Arthritis, Raynauds, Rheumatoid Arthritis, Osteoarthritis Osteoarthritis Osteoarthritis 11/27/2022 04/02/2023 04/02/2023 Date Acquired: 14 4 4  Weeks of Treatment: Open Open Open Wound Status: No No No Wound Recurrence: Yes No No Clustered Wound: 7.3x5.8x0.1 0.6x0.6x0.1 0.1x0.1x0.1 Measurements L x W x D (cm) 33.254 0.283 0.008 A (cm) : rea 3.325 0.028 0.001 Volume (cm) : -281.40% 99.00% 99.90% % Reduction in A rea: -281.30% 99.00% 99.90% % Reduction in Volume: Full Thickness Without Exposed Full Thickness Without Exposed Full Thickness Without Exposed Classification: Support Structures Support Structures Support Structures Medium Medium Medium Exudate A mount: Serosanguineous Serosanguineous Serosanguineous Exudate Type: red, brown red, brown red, brown Exudate Color: Distinct, outline attached Distinct, outline attached Distinct, outline attached Wound Margin: Medium (34-66%) Large (67-100%) Small (1-33%) Granulation A mount: Red, Pink Red Pink Granulation Quality: Medium (34-66%) Small (1-33%) Large  (67-100%) Necrotic A mount: Adherent Slough Eschar, Adherent Slough Eschar, Adherent Slough Necrotic Tissue: Fat Layer (Subcutaneous Tissue): Yes Fat Layer (Subcutaneous Tissue): Yes Fat Layer (Subcutaneous Tissue): Yes Exposed Structures: Fascia: No Fascia: No Fascia: No Tendon: No Tendon: No Tendon: No Muscle: No Muscle: No Muscle: No Joint: No Joint: No Joint: No Bone: No Bone: No Bone: No Small (1-33%) Small (1-33%) Large (67-100%) Epithelialization: Debridement - Selective/Open Wound Debridement - Selective/Open Wound Debridement - Selective/Open Wound Debridement: Pre-procedure Verification/Time Out 10:41 10:41 10:41 Taken: Lidocaine 4% Topical Solution Lidocaine 4% Topical Solution Lidocaine 4% Topical Solution Pain Control: Necrotic/Eschar, Therapist, occupational Tissue Debrided: Non-Viable Tissue Non-Viable Tissue Non-Viable Tissue Level: 33.24 0.28 0.01 Debridement A (sq cm): rea Curette Curette Curette Instrument: Minimum Minimum Minimum Bleeding: Pressure Pressure Pressure Hemostasis A chieved: 0 0 0 Procedural Pain: 0 0 0 Post Procedural Pain: Procedure was tolerated well Procedure was tolerated well Procedure was tolerated well Debridement Treatment Response: 7.3x5.8x0.1 0.6x0.6x0.1 0.1x0.1x0.1 Post Debridement Measurements L x W x D (cm) 3.325 0.028 0.001 Post Debridement Volume: (cm) Scarring: Yes No Abnormalities Noted Scarring: Yes Periwound Skin Texture: Excoriation: No Induration: No Callus: No Crepitus: No Rash: No Maceration: Yes No Abnormalities Noted No Abnormalities Noted Periwound Skin Moisture: Dry/Scaly: No Hemosiderin Staining: Yes Rubor: Yes Ecchymosis: Yes Periwound Skin Color: Atrophie Blanche: No Ecchymosis: No Cyanosis: No Ecchymosis: No Erythema: No Mottled: No Pallor: No Rubor: No No Abnormality No Abnormality No Abnormality Temperature: Clustered 3 wounds N/A N/A Assessment  Notes: Debridement Debridement Debridement Procedures Performed: Treatment Notes Electronic Signature(s) Signed: 05/04/2023 10:55:56 AM By: Duanne Guess MD FACS Entered By: Duanne Guess on 05/04/2023 10:55:56 Miggins, [name] ([id number]) [id number] Page 4 of 10 -------------------------------------------------------------------------------- Multi-Disciplinary Care Plan Details Patient Name: Date of Service: [name], [name] [name] 05/04/2023 10:15 A M Medical Record Number: [id number] Patient Account Number: [id number] Date of Birth/Sex: Treating RN: 09-Aug-1946 (77 y.o. Katrinka Blazing Primary Care Bruno Leach: [name] Other Clinician: Referring Alean Kromer: Treating Alixis Harmon/Extender: [name] in Treatment: 14 Active Inactive Nutrition Nursing Diagnoses: Potential for alteratiion in Nutrition/Potential for imbalanced nutrition Goals: Patient/caregiver agrees to and verbalizes understanding of need to obtain nutritional consultation Date Initiated: 01/26/2023 Target Resolution Date: 05/29/2023 Goal Status: Active Patient/caregiver will maintain therapeutic glucose control Date Initiated: 01/26/2023 Target Resolution Date: 05/29/2023 Goal Status: Active Interventions: Assess patient nutrition upon admission and as needed per policy Provide education on nutrition Treatment Activities: Giving encouragement to exercise : 01/26/2023 Notes: Pain, Acute or Chronic Nursing Diagnoses: Pain, acute or chronic: actual or potential Potential alteration in comfort, pain Goals: Patient will verbalize adequate pain control and receive pain control interventions  during procedures as needed Date Initiated: 01/26/2023 Target Resolution Date: 05/29/2023 Goal Status: Active Patient/caregiver will verbalize comfort level met Date Initiated: 01/26/2023 Target Resolution Date: 05/29/2023 Goal Status: Active Interventions: Complete pain assessment as per  visit requirements Encourage patient to take pain medications as prescribed Provide education on pain management Treatment Activities: Administer pain control measures as ordered : 01/26/2023 Notes: Electronic Signature(s) Signed: 05/05/2023 11:32:43 AM By: Elaine Schwalbe RN Entered By: [name] on 05/04/2023 17:44:42 Laible, Jasleen K ([id number]) [id number] Page 5 of 10 -------------------------------------------------------------------------------- Pain Assessment Details Patient Name: Date of Service: [name], [name] [name] 05/04/2023 10:15 A M Medical Record Number: [id number] Patient Account Number: [id number] Date of Birth/Sex: Treating RN: [birth year] (77 y.o. F) Primary Care Fitzroy Mikami: [name] Other Clinician: Referring Kiyanna Biegler: Treating Starlina Lapre/Extender: [name] in Treatment: 14 Active Problems Location of Pain Severity and Description of Pain Patient Has Paino No Site Locations Pain Management and Medication Current Pain Management: Electronic Signature(s) Signed: 05/04/2023 3:58:16 PM By: [name] Entered By: [name] on 05/04/2023 10:14:15 -------------------------------------------------------------------------------- Patient/Caregiver Education Details Patient Name: Date of Service: [name] [name] 7/8/2024andnbsp10:15 A M Medical Record Number: [id number] Patient Account Number: [id number] Date of Birth/Gender: Treating RN: 10-May-1946 (77 y.o. Katrinka Blazing Primary Care Physician: [name] Other Clinician: Referring Physician: Treating Physician/Extender: [name] in Treatment: 14 Education Assessment Education Provided To: Patient Education Topics Provided Wound/Skin Impairment: Methods: Explain/Verbal Responses: Return demonstration correctly Electronic Signature(s) Signed: 05/05/2023 11:32:43 AM By: Elaine Schwalbe RN Entered By: [name] on 05/04/2023  17:44:59 Flynt, [name] ([id number]) [id number] Page 6 of 10 -------------------------------------------------------------------------------- Wound Assessment Details Patient Name: Date of Service: [name], [name] [name] 05/04/2023 10:15 A M Medical Record Number: [id number] Patient Account Number: [id number] Date of Birth/Sex: Treating RN: [birth year] (77 y.o. F) Primary Care Kyleeann Cremeans: [name] Other Clinician: Referring Harold Moncus: Treating Roisin Mones/Extender: [name] in Treatment: 14 Wound Status Wound Number: 4 Primary Venous Leg Ulcer Etiology: Wound Location: Left, Anterior Lower Leg Wound Open Wounding Event: Trauma Status: Date Acquired: 11/27/2022 Comorbid Cataracts, Anemia, Chronic Obstructive Pulmonary Disease Weeks Of Treatment: 14 History: (COPD), Coronary Artery Disease, Peripheral Venous Disease, Clustered Wound: Yes Raynauds, Rheumatoid Arthritis, Osteoarthritis Photos Wound Measurements Length: (cm) 7.3 Width: (cm) 5.8 Depth: (cm) 0.1 Area: (cm) 33.254 Volume: (cm) 3.325 % Reduction in Area: -281.4% % Reduction in Volume: -281.3% Epithelialization: Small (1-33%) Tunneling: No Undermining: No Wound Description Classification: Full Thickness Without Exposed Suppor Wound Margin: Distinct, outline attached Exudate Amount: Medium Exudate Type: Serosanguineous Exudate Color: red, brown t Structures Foul Odor After Cleansing: No Slough/Fibrino Yes Wound Bed Granulation Amount: Medium (34-66%) Exposed Structure Granulation Quality: Red, Pink Fascia Exposed: No Necrotic Amount: Medium (34-66%) Fat Layer (Subcutaneous Tissue) Exposed: Yes Necrotic Quality: Adherent Slough Tendon Exposed: No Muscle Exposed: No Joint Exposed: No Bone Exposed: No Periwound Skin Texture Texture Color No Abnormalities Noted: No No Abnormalities Noted: No Callus: No Atrophie Blanche: No Crepitus: No Cyanosis: No Excoriation:  No Ecchymosis: No Induration: No Erythema: No Rash: No Hemosiderin Staining: Yes Scarring: Yes Mottled: No Pallor: No Moisture Rubor: No No Abnormalities Noted: Yes Temperature / Pain Lancour, Abeera K ([id number]) [id number] Page 7 of 10 Temperature: No Abnormality Assessment Notes Clustered 3 wounds Treatment Notes Wound #4 (Lower Leg) Wound Laterality: Left, Anterior Cleanser Soap and Water Discharge Instruction: May shower and wash wound with dial antibacterial soap and water prior to dressing change. Vashe 5.8 (oz) Discharge Instruction: Cleanse the wound with  Vashe prior to applying a clean dressing using gauze sponges, not tissue or cotton balls. Peri-Wound Care Topical Primary Dressing Hydrofera Blue Ready Transfer Foam, 4x5 (in/in) Discharge Instruction: Apply to wound bed as instructed Secondary Dressing T Non-adherent Dressing, 2x3 in elfa Discharge Instruction: Apply over primary dressing as directed. Secured With [employer] 4x5 (in/yd) Discharge Instruction: Secure with Coban as directed. Kerlix Roll Sterile, 4.5x3.1 (in/yd) Discharge Instruction: Secure as directed. Compression Wrap Compression Stockings Add-Ons Electronic Signature(s) Signed: 05/05/2023 11:32:43 AM By: Elaine Schwalbe RN Entered By: [name] on 05/04/2023 10:35:37 -------------------------------------------------------------------------------- Wound Assessment Details Patient Name: Date of Service: [name] [name] 05/04/2023 10:15 A M Medical Record Number: [id number] Patient Account Number: [id number] Date of Birth/Sex: Treating RN: [birth year] (77 y.o. F) Primary Care Kermitt Harjo: [name] Other Clinician: Referring Vester Titsworth: Treating Bhavya Grand/Extender: [name] in Treatment: 14 Wound Status Wound Number: 5 Primary Skin Tear Etiology: Wound Location: Left, Proximal, Anterior Lower Leg Wound Open Wounding  Event: Skin Tear/Laceration Status: Date Acquired: 04/02/2023 Comorbid Cataracts, Anemia, Chronic Obstructive Pulmonary Disease Weeks Of Treatment: 4 History: (COPD), Coronary Artery Disease, Peripheral Venous Disease, Clustered Wound: No Raynauds, Rheumatoid Arthritis, Osteoarthritis Photos [name], [name] ([id number]) [id number] Page 8 of 10 Wound Measurements Length: (cm) 0.6 Width: (cm) 0.6 Depth: (cm) 0.1 Area: (cm) 0.283 Volume: (cm) 0.028 % Reduction in Area: 99% % Reduction in Volume: 99% Epithelialization: Small (1-33%) Tunneling: No Undermining: No Wound Description Classification: Full Thickness Without Exposed Support Structures Wound Margin: Distinct, outline attached Exudate Amount: Medium Exudate Type: Serosanguineous Exudate Color: red, brown Foul Odor After Cleansing: No Slough/Fibrino Yes Wound Bed Granulation Amount: Large (67-100%) Exposed Structure Granulation Quality: Red Fascia Exposed: No Necrotic Amount: Small (1-33%) Fat Layer (Subcutaneous Tissue) Exposed: Yes Necrotic Quality: Eschar, Adherent Slough Tendon Exposed: No Muscle Exposed: No Joint Exposed: No Bone Exposed: No Periwound Skin Texture Texture Color No Abnormalities Noted: Yes No Abnormalities Noted: No Ecchymosis: No Moisture Rubor: Yes No Abnormalities Noted: Yes Temperature / Pain Temperature: No Abnormality Treatment Notes Wound #5 (Lower Leg) Wound Laterality: Left, Anterior, Proximal Cleanser Soap and Water Discharge Instruction: May shower and wash wound with dial antibacterial soap and water prior to dressing change. Vashe 5.8 (oz) Discharge Instruction: Cleanse the wound with Vashe prior to applying a clean dressing using gauze sponges, not tissue or cotton balls. Peri-Wound Care Topical Primary Dressing Hydrofera Blue Ready Transfer Foam, 4x5 (in/in) Discharge Instruction: Apply to wound bed as instructed Secondary Dressing T  Non-adherent Dressing, 2x3 in elfa Discharge Instruction: Apply over primary dressing as directed. Secured With [employer] 4x5 (in/yd) Discharge Instruction: Secure with Coban as directed. Kerlix Roll Sterile, 4.5x3.1 (in/yd) Discharge Instruction: Secure with cotton as directed. Compression [name], [name] ([id number]) [id number] Page 9 of 10 Compression Stockings Add-Ons Electronic Signature(s) Signed: 05/05/2023 11:32:43 AM By: Elaine Schwalbe RN Entered By: [name] on 05/04/2023 10:32:30 -------------------------------------------------------------------------------- Wound Assessment Details Patient Name: Date of Service: [name] [name] 05/04/2023 10:15 A M Medical Record Number: [id number] Patient Account Number: [id number] Date of Birth/Sex: Treating RN: January 18, 1946 (77 y.o. F) Primary Care Stuti Sandin: [name] Other Clinician: Referring Telisa Ohlsen: Treating Nawal Burling/Extender: [name] in Treatment: 14 Wound Status Wound Number: 6 Primary Skin Tear Etiology: Wound Location: Left Knee Wound Open Wounding Event: Skin Tear/Laceration Status: Date Acquired: 04/02/2023 Comorbid Cataracts, Anemia, Chronic Obstructive Pulmonary Disease Weeks Of Treatment: 4 History: (COPD), Coronary Artery Disease, Peripheral Venous Disease, Clustered Wound: No Raynauds, Rheumatoid Arthritis, Osteoarthritis Photos  Wound Measurements Length: (cm) 0.1 Width: (cm) 0.1 Depth: (cm) 0.1 Area: (cm) 0.008 Volume: (cm) 0.001 % Reduction in Area: 99.9% % Reduction in Volume: 99.9% Epithelialization: Large (67-100%) Tunneling: No Undermining: No Wound Description Classification: Full Thickness Without Exposed Suppor Wound Margin: Distinct, outline attached Exudate Amount: Medium Exudate Type: Serosanguineous Exudate Color: red, brown t Structures Foul Odor After Cleansing: No Slough/Fibrino Yes Wound  Bed Granulation Amount: Small (1-33%) Exposed Structure Granulation Quality: Pink Fascia Exposed: No Necrotic Amount: Large (67-100%) Fat Layer (Subcutaneous Tissue) Exposed: Yes Necrotic Quality: Eschar, Adherent Slough Tendon Exposed: No Muscle Exposed: No Joint Exposed: No Bone Exposed: No Periwound Skin Texture Texture Color No Abnormalities Noted: No No Abnormalities Noted: No [name], [name] ([id number]) [id number] Page 10 of 10 Scarring: Yes Ecchymosis: Yes Moisture Temperature / Pain No Abnormalities Noted: Yes Temperature: No Abnormality Treatment Notes Wound #6 (Knee) Wound Laterality: Left Cleanser Soap and Water Discharge Instruction: May shower and wash wound with dial antibacterial soap and water prior to dressing change. Vashe 5.8 (oz) Discharge Instruction: Cleanse the wound with Vashe prior to applying a clean dressing using gauze sponges, not tissue or cotton balls. Peri-Wound Care Topical Primary Dressing Hydrofera Blue Ready Transfer Foam, 2.5x2.5 (in/in) Discharge Instruction: Apply directly to wound bed as directed Secondary Dressing T Non-adherent Dressing, 2x3 in elfa Discharge Instruction: Apply over primary dressing as directed. Secured With Elastic Bandage 4 inch (ACE bandage) Discharge Instruction: Secure with ACE bandage as directed. Kerlix Roll Sterile, 4.5x3.1 (in/yd) Discharge Instruction: Secure with cotton as directed. Compression Wrap Compression Stockings Add-Ons Electronic Signature(s) Signed: 05/05/2023 11:32:43 AM By: Elaine Schwalbe RN Entered By: [name] on 05/04/2023 10:28:03 -------------------------------------------------------------------------------- Vitals Details Patient Name: Date of Service: [name] [name] 05/04/2023 10:15 A M Medical Record Number: [id number] Patient Account Number: [id number] Date of Birth/Sex: Treating RN: Aug 17, 1946 (77 y.o. F) Primary Care Yasenia Reedy: [name]  Other Clinician: Referring Mattison Stuckey: Treating Jais Demir/Extender: [name] in Treatment: 14 Vital Signs Time Taken: 10:13 Temperature (F): 98.4 Height (in): 63 Pulse (bpm): 97 Weight (lbs): 115 Respiratory Rate (breaths/min): 18 Body Mass Index (BMI): 20.4 Blood Pressure (mmHg): 136/72 Reference Range: 80 - 120 mg / dl Electronic Signature(s) Signed: 05/04/2023 3:58:16 PM By: [name] Entered By: [name] on 05/04/2023 10:14:10

## 2023-05-05 NOTE — Progress Notes (Signed)
[name] ([id number]) [id number] Page 1 of 13 Visit Report for 05/04/2023 Chief Complaint Document Details Patient Name: Date of Service: [name] RMA Long. 05/04/2023 10:15 A M Medical Record Number: [id number] Patient Account Number: [id number] Date of Birth/Sex: Treating RN: 1945/10/29 (77 y.o. F) Primary Care Provider: Dina Rich Other Clinician: Referring Provider: Treating Provider/Extender: Pincus Badder in Treatment: 14 Information Obtained from: Patient Chief Complaint Patient seen for complaints of Non-Healing Wound. Electronic Signature(s) Signed: 05/04/2023 10:56:04 AM By: Duanne Guess MD FACS Entered By: Duanne Guess on 05/04/2023 10:56:04 -------------------------------------------------------------------------------- Debridement Details Patient Name: Date of Service: [name] RMA Long. 05/04/2023 10:15 A M Medical Record Number: [id number] Patient Account Number: [id number] Date of Birth/Sex: Treating RN: 03-Mar-1946 (77 y.o. Katrinka Blazing Primary Care Provider: Dina Rich Other Clinician: Referring Provider: Treating Provider/Extender: Pincus Badder in Treatment: 14 Debridement Performed for Assessment: Wound #6 Left Knee Performed By: Physician Duanne Guess, MD Debridement Type: Debridement Level of Consciousness (Pre-procedure): Awake and Alert Pre-procedure Verification/Time Out Yes - 10:41 Taken: Start Time: 10:41 Pain Control: Lidocaine 4% Topical Solution Percent of Wound Bed Debrided: 100% T Area Debrided (cm): otal 0.01 Tissue and other material debrided: Non-Viable, Eschar Level: Non-Viable Tissue Debridement Description: Selective/Open Wound Instrument: Curette Bleeding: Minimum Hemostasis Achieved: Pressure End Time: 10:43 Procedural Pain: 0 Post Procedural Pain: 0 Response to Treatment: Procedure was tolerated well Level of Consciousness (Post- Awake and  Alert procedure): Post Debridement Measurements of Total Wound Length: (cm) 0.1 Width: (cm) 0.1 Depth: (cm) 0.1 Volume: (cm) 0.001 Character of Wound/Ulcer Post Debridement: Improved [name], [name] ([id number]) [id number] Page 2 of 13 Post Procedure Diagnosis Same as Pre-procedure Notes Scribed for Dr. Lady Gary by J.Scotton Electronic Signature(s) Signed: 05/04/2023 12:22:59 PM By: Duanne Guess MD FACS Signed: 05/05/2023 11:32:43 AM By: Karie Schwalbe RN Entered By: Karie Schwalbe on 05/04/2023 10:43:56 -------------------------------------------------------------------------------- Debridement Details Patient Name: Date of Service: [name] RMA Long. 05/04/2023 10:15 A M Medical Record Number: [id number] Patient Account Number: [id number] Date of Birth/Sex: Treating RN: Aug 05, 1946 (77 y.o. Katrinka Blazing Primary Care Provider: Dina Rich Other Clinician: Referring Provider: Treating Provider/Extender: Pincus Badder in Treatment: 14 Debridement Performed for Assessment: Wound #5 Left,Proximal,Anterior Lower Leg Performed By: Physician Duanne Guess, MD Debridement Type: Debridement Level of Consciousness (Pre-procedure): Awake and Alert Pre-procedure Verification/Time Out Yes - 10:41 Taken: Start Time: 10:41 Pain Control: Lidocaine 4% Topical Solution Percent of Wound Bed Debrided: 100% T Area Debrided (cm): otal 0.28 Tissue and other material debrided: Non-Viable, Eschar Level: Non-Viable Tissue Debridement Description: Selective/Open Wound Instrument: Curette Bleeding: Minimum Hemostasis Achieved: Pressure End Time: 10:43 Procedural Pain: 0 Post Procedural Pain: 0 Response to Treatment: Procedure was tolerated well Level of Consciousness (Post- Awake and Alert procedure): Post Debridement Measurements of Total Wound Length: (cm) 0.6 Width: (cm) 0.6 Depth: (cm) 0.1 Volume: (cm) 0.028 Character of  Wound/Ulcer Post Debridement: Improved Post Procedure Diagnosis Same as Pre-procedure Notes Scribed for Dr. Lady Gary by J.Scotton Electronic Signature(s) Signed: 05/04/2023 12:22:59 PM By: Duanne Guess MD FACS Signed: 05/05/2023 11:32:43 AM By: Karie Schwalbe RN Entered By: Karie Schwalbe on 05/04/2023 10:45:04 [name] ([id number]) [id number] Page 3 of 13 -------------------------------------------------------------------------------- Debridement Details Patient Name: Date of Service: [name], [name] RMA Long. 05/04/2023 10:15 A M Medical Record Number: [id number] Patient Account Number: [id number] Date of Birth/Sex: Treating RN: 09/02/1946 (77 y.o. F) Primary Care Provider: Dina Rich Other Clinician: Referring Provider: Treating Provider/Extender: Pincus Badder in Treatment: [phone]  Debridement Performed for Assessment: Wound #4 Left,Anterior Lower Leg Performed By: Physician Duanne Guess, MD Debridement Type: Debridement Severity of Tissue Pre Debridement: Fat layer exposed Level of Consciousness (Pre-procedure): Awake and Alert Pre-procedure Verification/Time Out Yes - 10:41 Taken: Start Time: 10:41 Pain Control: Lidocaine 4% Topical Solution Percent of Wound Bed Debrided: 20% T Area Debrided (cm): otal 6.65 Tissue and other material debrided: Non-Viable, Eschar, Slough, Slough Level: Non-Viable Tissue Debridement Description: Selective/Open Wound Instrument: Curette Bleeding: Minimum Hemostasis Achieved: Pressure End Time: 10:43 Procedural Pain: 0 Post Procedural Pain: 0 Response to Treatment: Procedure was tolerated well Level of Consciousness (Post- Awake and Alert procedure): Post Debridement Measurements of Total Wound Length: (cm) 7.3 Width: (cm) 5.8 Depth: (cm) 0.1 Volume: (cm) 3.325 Character of Wound/Ulcer Post Debridement: Improved Severity of Tissue Post Debridement: Fat layer exposed Post Procedure  Diagnosis Same as Pre-procedure Notes Scribed for Dr. Lady Gary by J.Scotton Electronic Signature(s) Signed: 05/04/2023 10:59:49 AM By: Duanne Guess MD FACS Entered By: Duanne Guess on 05/04/2023 10:59:49 -------------------------------------------------------------------------------- HPI Details Patient Name: Date of Service: [name] RMA Long. 05/04/2023 10:15 A M Medical Record Number: [id number] Patient Account Number: [id number] Date of Birth/Sex: Treating RN: 05/07/1946 (77 y.o. F) Primary Care Provider: Dina Rich Other Clinician: Referring Provider: Treating Provider/Extender: Pincus Badder in Treatment: 14 History of Present Illness [name], [name] ([id number]) [id number] Page 4 of 13 HPI Description: ADMISSION 02/19/2022 This is a 77 year old woman with minimal pertinent medical history. She does have COPD and pulmonary hypertension, but is not diabetic and is not a smoker. About 6 months ago, she and her husband got a new beagle puppy. The puppy scratched her leg and the scratches ultimately deteriorated into ulcerations. Apparently she sought care with a dermatologist who recommended applying a one-to-one mixture of peroxide and water to the wounds followed by a thick layer of Vaseline. She continue this for some time but then saw her primary care provider who told her to discontinue the peroxide. She has not been on any antibiotics for the scratches. Today, there are 3 separate wounds on her anterior tibial surface on the left. They are tender and her leg has localized swelling. There is yellow slough buildup in each of the wound bases. ABI in clinic today was normal at 1.16. 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 02/25/2022: Over the past week, the wounds themselves have not improved tremendously but she has noticed some stinging and the periwound skin is quite inflamed. I took a culture last week  that had low levels of methicillin-resistant Staph aureus. Because it was fairly low level, I did not prescribe an oral antibiotic. I had planned to change her to mupirocin today, however. We have been using Santyl under Hydrofera Blue with 3 layer compression. 03/04/2022: The wounds have improved quite a bit over the past week. They are less painful and red. She has been taking the prescribed doxycycline but says that it gives her a stomachache. We have been using mupirocin with Hydrofera Blue and 3 layer compression. 03/11/2022: Continued improvement of the wounds. They have contracted quite a bit and have a good surface of healthy granulation tissue present. There is some dried eschar present around all 3 of the lesions. 03/17/2022: Elaine significant change in the wounds from last week. The surface is clean with just a little bit of eschar and slough accumulation. Elaine odor or drainage. Elaine concern for infection. 03/26/2022: All of her wounds are smaller this week. There is good  granulation tissue on the surface of each. Elaine slough accumulation. There is just some dry skin around the wounds but not actually involving the wounds. Elaine concern for infection. 04/04/2022: The 2 proximal wounds have healed. The distal wound is much smaller and just has a little bit of dry eschar around the edges. 04/11/2022: Her wound is nearly healed. It is clean without concern for infection. 04/18/2022: Her wound has healed. READMISSION 01/26/2023 About 8 weeks ago, she sustained another scratch from her Beagle on her left anterior tibial surface.. She has been trying to treat the wound at home with Vaseline. There is thick slough buildup on the wound surface and the periwound skin looks a bit macerated. Elaine overt sign of infection. 01/29/2023: She came in for an unscheduled visit today because her wound began bleeding. It apparently bled quite profusely and her husband used an over-the- counter topical agent to get it to stop. On  inspection today, he managed to achieve good hemostasis and there is Elaine ongoing blood loss. 02/03/2023: Elaine further issues with bleeding. There is minimal slough accumulation. Some hypertrophic granulation tissue present. 02/11/2023: She has had a lot more drainage over the past week and there has been more breakdown of the skin around her wound. The wound itself has expanded and there is quite a bit of slough on the surfaces. 02/18/2023: Her wound looks much better this week. The drainage has decreased and the periwound skin is in much improved condition. The wound is smaller and there is just 1 tiny satellite adjacent to the main wound. There is slough accumulation on the surfaces. The culture that I took last week returned with methicillin sensitive Staph aureus. She is currently taking Keflex for this. 02/25/2023: Her wound is smaller again this week with minimal slough accumulation. The periwound looks significantly improved. She has completed her oral Keflex. 03/04/2023: The wound continues to contract. The periwound is completely healed and looks like normal intact skin. There is some slough on the surface of the wound overlying good granulation tissue. 03/11/2023: The wound measured about the same size today, although visually it appears smaller. There is a little bit of slough on the surface with good granulation tissue underneath. 03/17/2023: The wound is a little bit smaller today with light slough on the wound surface. Elaine erythema, induration, or significant drainage. 03/30/2023: The wound is a little bit smaller again today with minimal slough and eschar accumulation. 04/06/2023: Her skin irritation from the Zetuvit dressing has gotten worse and she has had a fair amount of skin breakdown in the distribution of the bandage. This is also caused some breakdown of the existing wound. In addition, when she was at a wedding this weekend, she fell and suffered a large skin tear to her proximal left lower  leg and left knee. The fat layer is exposed. There is slough accumulation on the surfaces. 04/13/2023: All of the wounds have deteriorated. They are red and angry-looking. There is slough on all of the surfaces. 04/20/2023: There has been significant improvement since last week. The erythema and irritation have resolved. There is minimal slough on the wound surfaces along with a little bit of light periwound eschar. She continues on Keflex. 04/27/2023: Continued improvement of her wounds. All of them are smaller. The knee wound is nearly closed. There is slough and eschar on all of the wound surfaces. She has completed her course of oral antibiotics. 05/04/2023: The wounds are all smaller today with slough and eschar present on all of  the surfaces. The moisture balance is leaning a bit towards the dry side, however. Electronic Signature(s) Signed: 05/04/2023 10:57:00 AM By: Duanne Guess MD FACS Entered By: Duanne Guess on 05/04/2023 10:57:00 [name] ([id number]) [id number] Page 5 of 13 -------------------------------------------------------------------------------- Physical Exam Details Patient Name: Date of Service: [name], [name] RMA Long. 05/04/2023 10:15 A M Medical Record Number: [id number] Patient Account Number: [id number] Date of Birth/Sex: Treating RN: 1946-06-10 (77 y.o. F) Primary Care Provider: Dina Rich Other Clinician: Referring Provider: Treating Provider/Extender: Pincus Badder in Treatment: 14 Constitutional . . . . Elaine acute distress. Respiratory Normal work of breathing on room air. Notes 05/04/2023: The wounds are all smaller today with slough and eschar present on all of the surfaces. The moisture balance is leaning a bit towards the dry side, however. Electronic Signature(s) Signed: 05/04/2023 10:57:32 AM By: Duanne Guess MD FACS Entered By: Duanne Guess on 05/04/2023  10:57:32 -------------------------------------------------------------------------------- Physician Orders Details Patient Name: Date of Service: [name] RMA Long. 05/04/2023 10:15 A M Medical Record Number: [id number] Patient Account Number: [id number] Date of Birth/Sex: Treating RN: [birth year] (77 y.o. 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 - Dr. Lady Gary Room 3 Tuesday 11am 05/12/23 Return A Anesthetic (In clinic) Topical Lidocaine 4% applied to wound bed Bathing/ Shower/ Hygiene May shower with protection but do not get wound dressing(s) wet. Protect dressing(s) with water repellant cover (for example, large plastic bag) or a cast cover and may then take shower. Edema Control - Lymphedema / SCD / Other Elevate legs to the level of the heart or above for 30 minutes daily and/or when sitting for 3-4 times a day throughout the day. A void standing for long periods of time. Exercise regularly If compression wraps slide down please call wound center and speak with a nurse. Wound Treatment Wound #4 - Lower Leg Wound Laterality: Left, Anterior LAPRECIOUS, SALMONSEN ([id number]) [id number] Page 6 of 13 Cleanser: Soap and Water 1 x Per Week/30 Days Discharge Instructions: May shower and wash wound with dial antibacterial soap and water prior to dressing change. Cleanser: Vashe 5.8 (oz) 1 x Per Week/30 Days Discharge Instructions: Cleanse the wound with Vashe prior to applying a clean  dressing using gauze sponges, not tissue or cotton balls. Prim Dressing: Hydrofera Blue Ready Transfer Foam, 4x5 (in/in) 1 x Per Week/30 Days ary Discharge Instructions: Apply to wound bed as instructed Secondary Dressing: T Non-adherent Dressing, 2x3 in 1 x Per Week/30 Days elfa Discharge Instructions: Apply over primary dressing as directed. Secured With: Coban Self-Adherent Wrap 4x5 (in/yd) 1 x Per Week/30 Days Discharge Instructions: Secure with Coban as directed. Secured With: [employer], 4.5x3.1 (in/yd) 1 x Per Week/30 Days Discharge Instructions: Secure as directed. Wound #5 - Lower Leg Wound Laterality: Left, Anterior, Proximal Cleanser: Soap and Water 1 x Per Week/30 Days Discharge Instructions: May shower and wash wound with dial antibacterial soap and water prior to dressing change. Cleanser: Vashe 5.8 (oz) 1 x Per Week/30 Days Discharge Instructions: Cleanse the wound with Vashe prior to applying a clean dressing using gauze sponges, not tissue or cotton balls. Prim  Dressing: Hydrofera Blue Ready Transfer Foam, 4x5 (in/in) 1 x Per Week/30 Days ary Discharge Instructions: Apply to wound bed as instructed Secondary Dressing: T Non-adherent Dressing, 2x3 in 1 x Per Week/30 Days elfa Discharge Instructions: Apply over primary dressing as directed. Secured With: Coban Self-Adherent Wrap 4x5 (in/yd) 1 x Per Week/30 Days Discharge Instructions: Secure with Coban as directed. Secured With: [employer], 4.5x3.1 (in/yd) 1 x Per Week/30 Days Discharge Instructions: Secure with cotton as directed. Wound #6 - Knee Wound Laterality: Left Cleanser: Soap and Water 1 x Per Day/30 Days Discharge Instructions: May shower and wash wound with dial antibacterial soap and water prior to dressing change. Cleanser: Vashe 5.8 (oz) 1 x Per Day/30 Days Discharge Instructions: Cleanse the wound with Vashe prior to applying a clean dressing using gauze sponges, not tissue or cotton  balls. Prim Dressing: Hydrofera Blue Ready Transfer Foam, 2.5x2.5 (in/in) 1 x Per Day/30 Days ary Discharge Instructions: Apply directly to wound bed as directed Secondary Dressing: T Non-adherent Dressing, 2x3 in 1 x Per Day/30 Days elfa Discharge Instructions: Apply over primary dressing as directed. Secured With: Elastic Bandage 4 inch (ACE bandage) 1 x Per Day/30 Days Discharge Instructions: Secure with ACE bandage as directed. Secured With: [employer], 4.5x3.1 (in/yd) 1 x Per Day/30 Days Discharge Instructions: Secure with cotton as directed. Electronic Signature(s) Signed: 05/04/2023 12:22:59 PM By: Duanne Guess MD FACS Signed: 05/05/2023 11:32:43 AM By: Karie Schwalbe RN Entered By: Karie Schwalbe on 05/04/2023 11:03:51 -------------------------------------------------------------------------------- Problem List Details Patient Name: Date of Service: [name] RMA Long. 05/04/2023 10:15 A M Medical Record Number: [id number] Patient Account Number: [id number] Date of Birth/Sex: Treating RN: Jun 28, 1946 (77 y.o. 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 05/04/2023 10:15 A M Medical Record Number: [id number] Patient Account Number: [id number] Date of Birth/Sex: Treating RN: 1946-03-01 (77 y.o. F) Primary Care Provider: Dina Rich Other Clinician: Referring Provider: Treating Provider/Extender: Pincus Badder in Treatment: 14 Subjective Chief Complaint Information obtained from Patient Patient seen for complaints of Non-Healing Wound. History of Present Illness (HPI) ADMISSION 02/19/2022 This is a 77 year old woman with minimal pertinent medical history. She does have COPD and pulmonary hypertension, but is not diabetic and is not a smoker. About 6 months ago, she and her husband got a new beagle puppy. The puppy scratched her leg and the scratches ultimately deteriorated into ulcerations. Apparently she sought care with a dermatologist who recommended applying a one-to-one mixture of peroxide and water to the wounds followed by a thick layer of Vaseline. She continue this for some time but then saw her primary care provider who told her to discontinue the peroxide. She has not been on any antibiotics for the scratches. Today, there are 3 separate wounds on her anterior tibial surface on the left. They are tender and her leg has localized  swelling. There is yellow slough buildup in each of the wound bases. ABI in clinic today was normal at 1.16. 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 02/25/2022: Over the past week, the wounds themselves have not improved tremendously but she has noticed some stinging and the periwound skin is quite inflamed. I took a culture last week that had low levels of methicillin-resistant Staph aureus. Because it was fairly low level, I did not prescribe an oral antibiotic. I had planned to change her to  mupirocin today, however. We have been using Santyl under Hydrofera Blue with 3 layer compression. 03/04/2022: The wounds have improved quite a bit over the past week. They are less painful and red. She has been taking the prescribed doxycycline but says that it gives her a stomachache. We have been using mupirocin with Hydrofera Blue and 3 layer compression. 03/11/2022: Continued improvement of the wounds. They have contracted quite a bit and have a good surface of healthy granulation tissue present. There is some dried eschar present around all 3 of the lesions. AISLA, ROBLYER ([id number]) [id number] Page 8 of 13 03/17/2022: Elaine significant change in the wounds from last week. The surface is clean with just a little bit of eschar and slough accumulation. Elaine odor or drainage. Elaine concern for infection. 03/26/2022: All of her wounds are smaller this week. There is good granulation tissue on the surface of each. Elaine slough accumulation. There is just some dry skin around the wounds but not actually involving the wounds. Elaine concern for infection. 04/04/2022: The 2 proximal wounds have healed. The distal wound is much smaller and just has a little bit of dry eschar around the edges. 04/11/2022: Her wound is nearly healed. It is clean without concern for infection. 04/18/2022: Her wound has healed. READMISSION 01/26/2023 About 8 weeks ago, she sustained another scratch from her Beagle on her left anterior tibial surface.. She has been trying to treat the wound at home with Vaseline. There is thick slough buildup on the wound surface and the periwound skin looks a bit macerated. Elaine overt sign of infection. 01/29/2023: She came in for an unscheduled visit today because her wound began bleeding. It apparently bled quite profusely and her husband used an over-the- counter topical agent to get it to stop. On inspection today, he managed to achieve good hemostasis and there is Elaine ongoing blood  loss. 02/03/2023: Elaine further issues with bleeding. There is minimal slough accumulation. Some hypertrophic granulation tissue present. 02/11/2023: She has had a lot more drainage over the past week and there has been more breakdown of the skin around her wound. The wound itself has expanded and there is quite a bit of slough on the surfaces. 02/18/2023: Her wound looks much better this week. The drainage has decreased and the periwound skin is in much improved condition. The wound is smaller and there is just 1 tiny satellite adjacent to the main wound. There is slough accumulation on the surfaces. The culture that I took last week returned with methicillin sensitive Staph aureus. She is currently taking Keflex for this. 02/25/2023: Her wound is smaller again this week with minimal slough accumulation. The periwound looks significantly improved. She has completed her oral Keflex. 03/04/2023: The wound continues to contract. The periwound is completely healed and looks like normal intact skin. There is some slough on the surface of the wound overlying good granulation tissue. 03/11/2023: The wound measured about the same size today, although visually it appears smaller.  There is a little bit of slough on the surface with good granulation tissue underneath. 03/17/2023: The wound is a little bit smaller today with light slough on the wound surface. Elaine erythema, induration, or significant drainage. 03/30/2023: The wound is a little bit smaller again today with minimal slough and eschar accumulation. 04/06/2023: Her skin irritation from the Zetuvit dressing has gotten worse and she has had a fair amount of skin breakdown in the distribution of the bandage. This is also caused some breakdown of the existing wound. In addition, when she was at a wedding this weekend, she fell and suffered a large skin tear to her proximal left lower leg and left knee. The fat layer is exposed. There is slough accumulation on the  surfaces. 04/13/2023: All of the wounds have deteriorated. They are red and angry-looking. There is slough on all of the surfaces. 04/20/2023: There has been significant improvement since last week. The erythema and irritation have resolved. There is minimal slough on the wound surfaces along with a little bit of light periwound eschar. She continues on Keflex. 04/27/2023: Continued improvement of her wounds. All of them are smaller. The knee wound is nearly closed. There is slough and eschar on all of the wound surfaces. She has completed her course of oral antibiotics. 05/04/2023: The wounds are all smaller today with slough and eschar present on all of the surfaces. The moisture balance is leaning a bit towards the dry side, however. Patient History Information obtained from Patient, Caregiver. Family History Unknown History. Social History Former smoker, Marital Status - Married, Alcohol Use - Never, Drug Use - Elaine History, Caffeine Use - Rarely. Medical History Eyes Patient has history of Cataracts - bil removed Denies history of Glaucoma, Optic Neuritis Ear/Nose/Mouth/Throat Denies history of Chronic sinus problems/congestion, Middle ear problems Hematologic/Lymphatic Patient has history of Anemia Respiratory Patient has history of Chronic Obstructive Pulmonary Disease (COPD) Cardiovascular Patient has history of Coronary Artery Disease, Peripheral Venous Disease - varicose veins Endocrine Denies history of Type I Diabetes, Type II Diabetes Genitourinary Denies history of End Stage Renal Disease Immunological Patient has history of Raynauds Denies history of Lupus Erythematosus, Scleroderma Integumentary (Skin) Denies history of History of Burn Musculoskeletal Patient has history of Rheumatoid Arthritis, Osteoarthritis Denies history of Gout [name], [name] Long ([id number]) [id number] Page 9 of 13 Oncologic Denies history of Received Chemotherapy, Received  Radiation Psychiatric Denies history of Anorexia/bulimia, Confinement Anxiety Hospitalization/Surgery History - bil cataract removal. - left shoiulder replacement. - right rotator cuff repair. - multiple lumbar spine surgeries. - melanoma removed left arm and chest. - partial excision bone left 2nd toe. Medical A Surgical History Notes nd Hematologic/Lymphatic varicose vein of leg, mixed hyperlipidemia Respiratory dyspnea, chronic bronchitis, pulmonary hypertension, pulmonary emphysema Cardiovascular aortic calcification, tachycardia, left ventricular dysfunction, bradycardia Gastrointestinal Gerd Endocrine prediabetes, hypothyroidism Genitourinary bladder disorder Integumentary (Skin) contact dermatitis Musculoskeletal acquired plantar porokeratosis, neurogenic claudication d/t lumbar spinal stenosis, supraspinatus tendinitis, restless leg syndrome, spinal stenosis of lumbar region Neurologic TIA, insomnia Objective Constitutional Elaine acute distress. Vitals Time Taken: 10:13 AM, Height: 63 in, Weight: 115 lbs, BMI: 20.4, Temperature: 98.4 F, Pulse: 97 bpm, Respiratory Rate: 18 breaths/min, Blood Pressure: 136/72 mmHg. Respiratory Normal work of breathing on room air. General Notes: 05/04/2023: The wounds are all smaller today with slough and eschar present on all of the surfaces. The moisture balance is leaning a bit towards the dry side, however. Integumentary (Hair, Skin) Wound #4 status is Open. Original cause of wound was Trauma. The date  acquired was: 11/27/2022. The wound has been in treatment 14 weeks. The wound is located on the Left,Anterior Lower Leg. The wound measures 7.3cm length x 5.8cm width x 0.1cm depth; 33.254cm^2 area and 3.325cm^3 volume. There is Fat Layer (Subcutaneous Tissue) exposed. There is Elaine tunneling or undermining noted. There is a medium amount of serosanguineous drainage noted. The wound margin is distinct with the outline attached to the wound base.  There is medium (34-66%) red, pink granulation within the wound bed. There is a medium (34-66%) amount of necrotic tissue within the wound bed including Adherent Slough. The periwound skin appearance had Elaine abnormalities noted for moisture. The periwound skin appearance exhibited: Scarring, Hemosiderin Staining. The periwound skin appearance did not exhibit: Callus, Crepitus, Excoriation, Induration, Rash, Atrophie Blanche, Cyanosis, Ecchymosis, Mottled, Pallor, Rubor, Erythema. Periwound temperature was noted as Elaine Abnormality. General Notes: Clustered 3 wounds Wound #5 status is Open. Original cause of wound was Skin T ear/Laceration. The date acquired was: 04/02/2023. The wound has been in treatment 4 weeks. The wound is located on the Left,Proximal,Anterior Lower Leg. The wound measures 0.6cm length x 0.6cm width x 0.1cm depth; 0.283cm^2 area and 0.028cm^3 volume. There is Fat Layer (Subcutaneous Tissue) exposed. There is Elaine tunneling or undermining noted. There is a medium amount of serosanguineous drainage noted. The wound margin is distinct with the outline attached to the wound base. There is large (67-100%) red granulation within the wound bed. There is a small (1-33%) amount of necrotic tissue within the wound bed including Eschar and Adherent Slough. The periwound skin appearance had Elaine abnormalities noted for texture. The periwound skin appearance had Elaine abnormalities noted for moisture. The periwound skin appearance exhibited: Rubor. The periwound skin appearance did not exhibit: Ecchymosis. Periwound temperature was noted as Elaine Abnormality. Wound #6 status is Open. Original cause of wound was Skin T ear/Laceration. The date acquired was: 04/02/2023. The wound has been in treatment 4 weeks. The wound is located on the Left Knee. The wound measures 0.1cm length x 0.1cm width x 0.1cm depth; 0.008cm^2 area and 0.001cm^3 volume. There is Fat Layer (Subcutaneous Tissue) exposed. There is Elaine  tunneling or undermining noted. There is a medium amount of serosanguineous drainage noted. The wound margin is distinct with the outline attached to the wound base. There is small (1-33%) pink granulation within the wound bed. There is a large (67-100%) amount of necrotic tissue within the wound bed including Eschar and Adherent Slough. The periwound skin appearance had Elaine abnormalities noted for moisture. The periwound skin appearance exhibited: Scarring, Ecchymosis. Periwound temperature was noted as Elaine Abnormality. 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 There was a Selective/Open Wound Non-Viable Tissue Debridement with a total area of 6.65 sq cm performed by Duanne Guess, MD. With the following instrument(s): Curette to remove Non-Viable tissue/material. Material removed includes Eschar and Slough and after achieving pain control using Lidocaine 4% Topical Solution. Elaine specimens were taken. A time out was conducted at 10:41, prior to the start of the procedure. A Minimum amount of bleeding was controlled with Pressure. The procedure was tolerated well with a pain level of 0  throughout and a pain level of 0 following the procedure. Post Debridement Measurements: 7.3cm length x 5.8cm width x 0.1cm depth; 3.325cm^3 volume. Character of Wound/Ulcer Post Debridement is improved. Severity of Tissue Post Debridement is: Fat layer exposed. Post procedure Diagnosis Wound #4: Same as Pre-Procedure General Notes: Scribed for Dr. Lady Gary by  J.Scotton. Wound #5 Pre-procedure diagnosis of Wound #5 is a Skin T located on the Left,Proximal,Anterior Lower Leg . There was a Selective/Open Wound Non-Viable Tissue ear Debridement with a total area of 0.28 sq cm performed by Duanne Guess, MD. With the following instrument(s): Curette to remove Non-Viable tissue/material. Material removed includes Eschar after achieving pain control using Lidocaine 4% Topical Solution. Elaine specimens were taken. A time out was conducted at 10:41, prior to the start of the procedure. A Minimum amount of bleeding was controlled with Pressure. The procedure was tolerated well with a pain level of 0 throughout and a pain level of 0 following the procedure. Post Debridement Measurements: 0.6cm length x 0.6cm width x 0.1cm depth; 0.028cm^3 volume. Character of Wound/Ulcer Post Debridement is improved. Post procedure Diagnosis Wound #5: Same as Pre-Procedure General Notes: Scribed for Dr. Lady Gary by J.Scotton. Wound #6 Pre-procedure diagnosis of Wound #6 is a Skin T located on the Left Knee . There was a Selective/Open Wound Non-Viable Tissue Debridement with a total ear area of 0.01 sq cm performed by Duanne Guess, MD. With the following instrument(s): Curette to remove Non-Viable tissue/material. Material removed includes Eschar after achieving pain control using Lidocaine 4% Topical Solution. Elaine specimens were taken. A time out was conducted at 10:41, prior to the start of the procedure. A Minimum amount of bleeding was controlled with Pressure. The procedure was tolerated well with a pain level of 0 throughout and a pain level of 0 following the procedure. Post Debridement Measurements: 0.1cm length x 0.1cm width x 0.1cm depth; 0.001cm^3 volume. Character of Wound/Ulcer Post Debridement is improved. Post procedure Diagnosis Wound #6: Same as Pre-Procedure General Notes: Scribed for Dr. Lady Gary by J.Scotton. Plan Follow-up Appointments: Return Appointment  in 1 week. - Dr. Lady Gary Room Anesthetic: (In clinic) Topical Lidocaine 4% applied to wound bed Bathing/ Shower/ Hygiene: May shower with protection but do not get wound dressing(s) wet. Protect dressing(s) with water repellant cover (for example, large plastic bag) or a cast cover and may then take shower. Edema Control - Lymphedema / SCD / Other: Elevate legs to the level of the heart or above for 30 minutes daily and/or when sitting for 3-4 times a day throughout the day. Avoid standing for long periods of time. Exercise regularly If compression wraps slide down please call wound center and speak with a nurse. WOUND #4: - Lower Leg Wound Laterality: Left, Anterior Cleanser: Soap and Water 1 x Per Week/30 Days Discharge Instructions: May shower and wash wound with dial antibacterial soap and water prior to dressing change. Cleanser: Vashe 5.8 (oz) 1 x Per Week/30 Days Discharge Instructions: Cleanse the wound with Vashe prior to applying a clean dressing using gauze sponges, not tissue or cotton balls. Prim Dressing: Hydrofera Blue Ready Transfer Foam, 4x5 (in/in) 1 x Per Week/30 Days ary Discharge Instructions: Apply to wound bed as instructed Secondary Dressing: T Non-adherent Dressing, 2x3 in 1 x Per Week/30 Days elfa Discharge Instructions: Apply over primary dressing as directed. Secured With: Coban Self-Adherent Wrap 4x5 (in/yd) 1 x Per Week/30 Days Discharge Instructions: Secure with Coban as directed. Secured With: [employer], 4.5x3.1 (in/yd) 1 x Per Week/30 Days  Discharge Instructions: Secure with cotton as directed. WOUND #5: - Lower Leg Wound Laterality: Left, Anterior, Proximal Cleanser: Soap and Water 1 x Per Week/30 Days Discharge Instructions: May shower and wash wound with dial antibacterial soap and water prior to dressing change. Cleanser: Vashe 5.8 (oz) 1 x Per Week/30 Days Discharge Instructions: Cleanse the wound with Vashe prior to applying a clean  dressing using gauze sponges, not tissue or cotton balls. Prim Dressing: Hydrofera Blue Ready Transfer Foam, 4x5 (in/in) 1 x Per Week/30 Days ary Discharge Instructions: Apply to wound bed as instructed Secondary Dressing: T Non-adherent Dressing, 2x3 in 1 x Per Week/30 Days elfa Discharge Instructions: Apply over primary dressing as directed. Secured With: Coban Self-Adherent Wrap 4x5 (in/yd) 1 x Per Week/30 Days Discharge Instructions: Secure with Coban as directed. Secured With: [employer], 4.5x3.1 (in/yd) 1 x Per Week/30 Days Discharge Instructions: Secure with cotton as directed. WOUND #6: - Knee Wound Laterality: Left Cleanser: Soap and Water 1 x Per Day/30 Days Discharge Instructions: May shower and wash wound with dial antibacterial soap and water prior to dressing change. Cleanser: Vashe 5.8 (oz) 1 x Per Day/30 Days Discharge Instructions: Cleanse the wound with Vashe prior to applying a clean dressing using gauze sponges, not tissue or cotton balls. [name], [name] ([id number]) [id number] Page 11 of 13 Prim Dressing: Hydrofera Blue Ready Transfer Foam, 2.5x2.5 (in/in) 1 x Per Day/30 Days ary Discharge Instructions: Apply directly to wound bed as directed Secondary Dressing: T Non-adherent Dressing, 2x3 in 1 x Per Day/30 Days elfa Discharge Instructions: Apply over primary dressing as directed. Secured With: Elastic Bandage 4 inch (ACE bandage) 1 x Per Day/30 Days Discharge Instructions: Secure with ACE bandage as directed. Secured With: [employer], 4.5x3.1 (in/yd) 1 x Per Day/30 Days Discharge Instructions: Secure with cotton as directed. 05/04/2023: The wounds are all smaller today with slough and eschar present on all of the surfaces. The moisture balance is leaning a bit towards the dry side, however. I used a curette to debride slough and eschar from each of her wounds. As they are tending to be a little on the dry side, I am going  to change her contact layer to Prisma silver collagen. Continue periwound triamcinolone. Continue cotton and Coban wrap. Follow-up in 1 week. Electronic Signature(s) Signed: 05/04/2023 11:00:04 AM By: Duanne Guess MD FACS Previous Signature: 05/04/2023 10:58:49 AM Version By: Duanne Guess MD FACS Entered By: Duanne Guess on 05/04/2023 11:00:04 -------------------------------------------------------------------------------- HxROS Details Patient Name: Date of Service: [name], [name]. 05/04/2023 10:15 A M Medical Record Number: [id number] Patient Account Number: [id number] Date of Birth/Sex: Treating RN: 1945/11/27 (77 y.o. F) Primary Care Provider: Dina Rich Other Clinician: Referring Provider: Treating Provider/Extender: Pincus Badder in Treatment: 14 Information Obtained From Patient Caregiver Eyes Medical History: Positive for: Cataracts - bil removed Negative for: Glaucoma; Optic Neuritis Ear/Nose/Mouth/Throat Medical History: Negative for: Chronic sinus problems/congestion; Middle ear problems Hematologic/Lymphatic Medical History: Positive for: Anemia Past Medical History Notes: varicose vein of leg, mixed hyperlipidemia Respiratory Medical History: Positive for: Chronic Obstructive Pulmonary Disease (COPD) Past Medical History Notes: dyspnea, chronic bronchitis, pulmonary hypertension, pulmonary emphysema Cardiovascular Medical History: Positive for: Coronary Artery Disease; Peripheral Venous Disease - varicose veins Past Medical History Notes: aortic calcification, tachycardia, left ventricular dysfunction, bradycardia Gastrointestinal Medical History: Past Medical History NotesNEIDRA, [name] ([id number]) [id number] Page 12 of 13 [name] Endocrine Medical History: Negative for: Type I Diabetes; Type II Diabetes Past Medical History Notes: prediabetes, hypothyroidism  Genitourinary Medical  History: Negative for: End Stage Renal Disease Past Medical History Notes: bladder disorder Immunological Medical History: Positive for: Raynauds Negative for: Lupus Erythematosus; Scleroderma Integumentary (Skin) Medical History: Negative for: History of Burn Past Medical History Notes: contact dermatitis Musculoskeletal Medical History: Positive for: Rheumatoid Arthritis; Osteoarthritis Negative for: Gout Past Medical History Notes: acquired plantar porokeratosis, neurogenic claudication d/t lumbar spinal stenosis, supraspinatus tendinitis, restless leg syndrome, spinal stenosis of lumbar region Neurologic Medical History: Past Medical History Notes: TIA, insomnia Oncologic Medical History: Negative for: Received Chemotherapy; Received Radiation Psychiatric Medical History: Negative for: Anorexia/bulimia; Confinement Anxiety HBO Extended History Items Eyes: Cataracts Immunizations Pneumococcal Vaccine: Received Pneumococcal Vaccination: Yes Received Pneumococcal Vaccination On or After 60th Birthday: Yes Implantable Devices None Hospitalization / Surgery History Type of Hospitalization/Surgery bil cataract removal left shoiulder replacement right rotator cuff repair multiple lumbar spine surgeries melanoma removed left arm and chest partial excision bone left 2nd toe Family and Social History Unknown History: Yes; Former smoker; Marital Status - Married; Alcohol Use: Never; Drug Use: Elaine History; Caffeine Use: Rarely; Financial Concerns: NoSHARANE, [name] ([id number]) [id number] Page 13 of 13 Food, [employer] fast streamer or Shelter Needs: Elaine; Support System Lacking: Elaine; Transportation Concerns: Elaine Psychologist, prison and probation services) Signed: 05/04/2023 12:22:59 PM By: Duanne Guess MD FACS Entered By: Duanne Guess on 05/04/2023 10:57:07 -------------------------------------------------------------------------------- SuperBill Details Patient Name: Date  of Service: [name] RMA Long. 05/04/2023 Medical Record Number: [id number] Patient Account Number: [id number] Date of Birth/Sex: Treating RN: [birth year] (77 y.o. F) Primary Care Provider: Dina Rich Other Clinician: Referring Provider: Treating Provider/Extender: Pincus Badder in Treatment: 14 Diagnosis Coding ICD-10 Codes Code Description [phone] Non-pressure chronic ulcer of other part of left lower leg with fat layer exposed L97.122 Non-pressure chronic ulcer of left thigh with fat layer exposed I27.20 Pulmonary hypertension, unspecified I51.9 Heart disease, unspecified Facility Procedures : CPT4 Code: [id number] Description: 97597 - DEBRIDE WOUND 1ST 20 SQ CM OR < ICD-10 Diagnosis Description L97.822 Non-pressure chronic ulcer of other part of left lower leg with fat layer expose L97.122 Non-pressure chronic ulcer of left thigh with fat layer exposed Modifier: d Quantity: 1 Physician Procedures : CPT4 Code Description Modifier [id number] - WC PHYS LEVEL 3 - EST PT ICD-10 Diagnosis Description L97.822 Non-pressure chronic ulcer of other part of left lower leg with fat layer exposed L97.122 Non-pressure chronic ulcer of left thigh with fat  layer exposed I27.20 Pulmonary hypertension, unspecified I51.9 Heart disease, unspecified Quantity: 1 : [id number] - WC PHYS DEBR WO ANESTH 20 SQ CM ICD-10 Diagnosis Description L97.822 Non-pressure chronic ulcer of other part of left lower leg with fat layer exposed L97.122 Non-pressure chronic ulcer of left thigh with fat layer exposed Quantity: 1 Electronic Signature(s) Signed: 05/04/2023 11:00:15 AM By: Duanne Guess MD FACS Previous Signature: 05/04/2023 10:59:21 AM Version By: Duanne Guess MD FACS Entered By: Duanne Guess on 05/04/2023 11:00:15

## 2023-05-12 ENCOUNTER — Encounter (HOSPITAL_BASED_OUTPATIENT_CLINIC_OR_DEPARTMENT_OTHER): Payer: PPO | Admitting: General Surgery

## 2023-05-12 DIAGNOSIS — L97822 Non-pressure chronic ulcer of other part of left lower leg with fat layer exposed: Secondary | ICD-10-CM | POA: Diagnosis not present

## 2023-05-12 NOTE — Progress Notes (Signed)
KEMI, GELL ([id number]) [id number] Page 1 of 12 Visit Report for 05/12/2023 Chief Complaint Document Details Patient Name: Date of Service: [name], Elaine [name] 05/12/2023 11:00 A M Medical Record Number: [id number] Patient Account Number: [id number] Date of Birth/Sex: Treating RN: 12-Jul-1946 (77 y.o. F) Primary Care Provider: Dina Rich Other Clinician: Referring Provider: Treating Provider/Extender: Pincus Badder in Treatment: 15 Information Obtained from: Patient Chief Complaint Patient seen for complaints of Non-Healing Wound. Electronic Signature(s) Signed: 05/12/2023 11:57:35 AM By: Duanne Guess MD FACS Entered By: Duanne Guess on 05/12/2023 11:57:35 -------------------------------------------------------------------------------- Debridement Details Patient Name: Date of Service: [name] [name] 05/12/2023 11:00 A M Medical Record Number: [id number] Patient Account Number: [id number] Date of Birth/Sex: Treating RN: 1946/06/22 (77 y.o. Katrinka Blazing Primary Care Provider: Dina Rich Other Clinician: Referring Provider: Treating Provider/Extender: Pincus Badder in Treatment: 15 Debridement Performed for Assessment: Wound #4 Left,Anterior Lower Leg Performed By: Physician Duanne Guess, MD Debridement Type: Debridement Severity of Tissue Pre Debridement: Fat layer exposed Level of Consciousness (Pre-procedure): Awake and Alert Pre-procedure Verification/Time Out Yes - 11:45 Taken: Start Time: 11:45 Pain Control: Lidocaine 5% topical ointment Percent of Wound Bed Debrided: 100% T Area Debrided (cm): otal 43.96 Tissue and other material debrided: Non-Viable, Slough, Slough Level: Non-Viable Tissue Debridement Description: Selective/Open Wound Instrument: Curette Bleeding: Minimum Hemostasis Achieved: Pressure End Time: 11:47 Procedural Pain: 0 Post Procedural Pain: 0 Response to  Treatment: Procedure was tolerated well Level of Consciousness (Post- Awake and Alert procedure): Post Debridement Measurements of Total Wound Length: (cm) 8 Width: (cm) 7 Depth: (cm) 0.1 Volume: (cm) 4.398 Character of Wound/Ulcer Post Debridement: Improved EVER, HALBERG K ([id number]) [id number] Page 2 of 12 Severity of Tissue Post Debridement: Fat layer exposed Post Procedure Diagnosis Same as Pre-procedure Notes Scribed for Dr. Lady Gary by J.Scotton Electronic Signature(s) Signed: 05/12/2023 12:23:22 PM By: Duanne Guess MD FACS Signed: 05/12/2023 4:25:31 PM By: Karie Schwalbe RN Entered By: Karie Schwalbe on 05/12/2023 11:48:30 -------------------------------------------------------------------------------- Debridement Details Patient Name: Date of Service: [name] [name] 05/12/2023 11:00 A M Medical Record Number: [id number] Patient Account Number: [id number] Date of Birth/Sex: Treating RN: [birth year] (77 y.o. Katrinka Blazing Primary Care Provider: Dina Rich Other Clinician: Referring Provider: Treating Provider/Extender: Pincus Badder in Treatment: 15 Debridement Performed for Assessment: Wound #5 Left,Proximal,Anterior Lower Leg Performed By: Physician Duanne Guess, MD Debridement Type: Debridement Level of Consciousness (Pre-procedure): Awake and Alert Pre-procedure Verification/Time Out Yes - 11:45 Taken: Start Time: 11:45 Pain Control: Lidocaine 5% topical ointment Percent of Wound Bed Debrided: 100% T Area Debrided (cm): otal 0.2 Tissue and other material debrided: Non-Viable, Slough, Slough Level: Non-Viable Tissue Debridement Description: Selective/Open Wound Instrument: Curette Bleeding: Minimum Hemostasis Achieved: Pressure End Time: 11:47 Procedural Pain: 0 Post Procedural Pain: 0 Response to Treatment: Procedure was tolerated well Level of Consciousness (Post- Awake and  Alert procedure): Post Debridement Measurements of Total Wound Length: (cm) 0.5 Width: (cm) 0.5 Depth: (cm) 0.1 Volume: (cm) 0.02 Character of Wound/Ulcer Post Debridement: Improved Post Procedure Diagnosis Same as Pre-procedure Notes Scribed for Dr. Lady Gary by J.Scotton Electronic Signature(s) Signed: 05/12/2023 12:23:22 PM By: Duanne Guess MD FACS Signed: 05/12/2023 4:25:31 PM By: Karie Schwalbe RN Entered By: Karie Schwalbe on 05/12/2023 11:49:30 [name] ([id number]) [id number] Page 3 of 12 -------------------------------------------------------------------------------- HPI Details Patient Name: Date of Service: Long, Elaine [name] 05/12/2023 11:00 A M Medical Record Number: [id number] Patient Account Number: [id number] Date of Birth/Sex: Treating RN: 08-05-1946 (77 y.o. F)  Primary Care Provider: Dina Rich Other Clinician: Referring Provider: Treating Provider/Extender: Pincus Badder in Treatment: 15 History of Present Illness HPI Description: ADMISSION 02/19/2022 This is a 77 year old woman with minimal pertinent medical history. She does have COPD and pulmonary hypertension, but is not diabetic and is not a smoker. About 6 months ago, she and her husband got a new beagle puppy. The puppy scratched her leg and the scratches ultimately deteriorated into ulcerations. Apparently she sought care with a dermatologist who recommended applying a one-to-one mixture of peroxide and water to the wounds followed by a thick layer of Vaseline. She continue this for some time but then saw her primary care provider who told her to discontinue the peroxide. She has not been on any antibiotics for the scratches. Today, there are 3 separate wounds on her anterior tibial surface on the left. They are tender and her leg has localized swelling. There is yellow slough buildup in each of the wound bases. ABI in clinic today was normal at 1.16.  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 02/25/2022: Over the past week, the wounds themselves have not improved tremendously but she has noticed some stinging and the periwound skin is quite inflamed. I took a culture last week that had low levels of methicillin-resistant Staph aureus. Because it was fairly low level, I did not prescribe an oral antibiotic. I had planned to change her to mupirocin today, however. We have been using Santyl under Hydrofera Blue with 3 layer compression. 03/04/2022: The wounds have improved quite a bit over the past week. They are less painful and red. She has been taking the prescribed doxycycline but says that it gives her a stomachache. We have been using mupirocin with Hydrofera Blue and 3 layer compression. 03/11/2022: Continued improvement of the wounds. They have contracted quite a bit and have a good surface of healthy granulation tissue present. There is some dried eschar present around all 3 of the lesions. 03/17/2022: No significant change in the wounds from last week. The surface is clean with just a little bit of eschar and slough accumulation. No odor or drainage. No concern for infection. 03/26/2022: All of her wounds are smaller this week. There is good granulation tissue on the surface of each. No slough accumulation. There is just some dry skin around the wounds but not actually involving the wounds. No concern for infection. 04/04/2022: The 2 proximal wounds have healed. The distal wound is much smaller and just has a little bit of dry eschar around the edges. 04/11/2022: Her wound is nearly healed. It is clean without concern for infection. 04/18/2022: Her wound has healed. READMISSION 01/26/2023 About 8 weeks ago, she sustained another scratch from her Beagle on her left anterior tibial surface.. She has been trying to treat the wound at home with Vaseline. There is thick slough buildup on the wound surface and the periwound skin  looks a bit macerated. No overt sign of infection. 01/29/2023: She came in for an unscheduled visit today because her wound began bleeding. It apparently bled quite profusely and her husband used an over-the- counter topical agent to get it to stop. On inspection today, he managed to achieve good hemostasis and there is no ongoing blood loss. 02/03/2023: No further issues with bleeding. There is minimal slough accumulation. Some hypertrophic granulation tissue present. 02/11/2023: She has had a lot more drainage over the past week and there has been more breakdown of the skin around her wound.  The wound itself has expanded and there is quite a bit of slough on the surfaces. 02/18/2023: Her wound looks much better this week. The drainage has decreased and the periwound skin is in much improved condition. The wound is smaller and there is just 1 tiny satellite adjacent to the main wound. There is slough accumulation on the surfaces. The culture that I took last week returned with methicillin sensitive Staph aureus. She is currently taking Keflex for this. 02/25/2023: Her wound is smaller again this week with minimal slough accumulation. The periwound looks significantly improved. She has completed her oral Keflex. 03/04/2023: The wound continues to contract. The periwound is completely healed and looks like normal intact skin. There is some slough on the surface of the wound overlying good granulation tissue. 03/11/2023: The wound measured about the same size today, although visually it appears smaller. There is a little bit of slough on the surface with good granulation tissue underneath. 03/17/2023: The wound is a little bit smaller today with light slough on the wound surface. No erythema, induration, or significant drainage. 03/30/2023: The wound is a little bit smaller again today with minimal slough and eschar accumulation. 04/06/2023: Her skin irritation from the Zetuvit dressing has gotten worse and she has  had a fair amount of skin breakdown in the distribution of the bandage. This is also caused some breakdown of the existing wound. In addition, when she was at a wedding this weekend, she fell and suffered a large skin tear to her proximal left lower leg and left knee. The fat layer is exposed. There is slough accumulation on the surfaces. 04/13/2023: All of the wounds have deteriorated. They are red and angry-looking. There is slough on all of the surfaces. 04/20/2023: There has been significant improvement since last week. The erythema and irritation have resolved. There is minimal slough on the wound surfaces along with a little bit of light periwound eschar. She continues on Keflex. SPRUHA, WEIGHT ([id number]) [id number] Page 4 of 12 04/27/2023: Continued improvement of her wounds. All of them are smaller. The knee wound is nearly closed. There is slough and eschar on all of the wound surfaces. She has completed her course of oral antibiotics. 05/04/2023: The wounds are all smaller today with slough and eschar present on all of the surfaces. The moisture balance is leaning a bit towards the dry side, however. 05/12/2023: The wound on her knee is healed. The other skin tear at the tibial tuberosity is nearly closed with just a small open area. The lower leg wounds are about the same size. Moisture balance is better. There is slough on all of the open wound surfaces. Electronic Signature(s) Signed: 05/12/2023 11:58:24 AM By: Duanne Guess MD FACS Entered By: Duanne Guess on 05/12/2023 11:58:24 -------------------------------------------------------------------------------- Physical Exam Details Patient Name: Date of Service: [name] [name] 05/12/2023 11:00 A M Medical Record Number: [id number] Patient Account Number: [id number] Date of Birth/Sex: Treating RN: [birth year] (77 y.o. F) Primary Care Provider: Dina Rich Other Clinician: Referring Provider: Treating  Provider/Extender: Pincus Badder in Treatment: 15 Constitutional Slightly hypertensive. . . . no acute distress. Respiratory Normal work of breathing on room air. Notes 05/12/2023: The wound on her knee is healed. The other skin tear at the tibial tuberosity is nearly closed with just a small open area. The lower leg wounds are about the same size. Moisture balance is better. There is slough on all of the open wound surfaces. Electronic Signature(s) Signed: 05/12/2023 11:58:58 AM  By: Duanne Guess MD FACS Entered By: Duanne Guess on 05/12/2023 11:58:58 -------------------------------------------------------------------------------- Physician Orders Details Patient Name: Date of Service: [name] [name] 05/12/2023 11:00 A M Medical Record Number: [id number] Patient Account Number: [id number] Date of Birth/Sex: Treating RN: [birth year] (77 y.o. Katrinka Blazing Primary Care Provider: Dina Rich Other Clinician: Referring Provider: Treating Provider/Extender: Pincus Badder in Treatment: 15 Verbal / Phone Orders: No Diagnosis Coding ICD-10 Coding Code Description [phone] Non-pressure chronic ulcer of other part of left lower leg with fat layer exposed I27.20 Pulmonary hypertension, unspecified I51.9 Heart disease, unspecified Follow-up Appointments ppointment in 1 week. - Dr. Lady Gary Room 1 Wed. 05/20/23 at 8:15am Return A ZARAI, ORSBORN K ([id number]) [id number] Page 5 of 12 Anesthetic (In clinic) Topical Lidocaine 4% applied to wound bed Bathing/ Shower/ Hygiene May shower with protection but do not get wound dressing(s) wet. Protect dressing(s) with water repellant cover (for example, large plastic bag) or a cast cover and may then take shower. Edema Control - Lymphedema / SCD / Other Elevate legs to the level of the heart or above for 30 minutes daily and/or when sitting for 3-4 times a day throughout  the day. A void standing for long periods of time. Exercise regularly If compression wraps slide down please call wound center and speak with a nurse. Wound Treatment Wound #4 - Lower Leg Wound Laterality: Left, Anterior Cleanser: Soap and Water 1 x Per Week/30 Days Discharge Instructions: May shower and wash wound with dial antibacterial soap and water prior to dressing change. Cleanser: Vashe 5.8 (oz) 1 x Per Week/30 Days Discharge Instructions: Cleanse the wound with Vashe prior to applying a clean dressing using gauze sponges, not tissue or cotton balls. Prim Dressing: Hydrofera Blue Ready Transfer Foam, 4x5 (in/in) 1 x Per Week/30 Days ary Discharge Instructions: Apply to wound bed as instructed Secondary Dressing: T Non-adherent Dressing, 2x3 in 1 x Per Week/30 Days elfa Discharge Instructions: Apply over primary dressing as directed. Secured With: Coban Self-Adherent Wrap 4x5 (in/yd) 1 x Per Week/30 Days Discharge Instructions: Secure with Coban as directed. Secured With: [employer], 4.5x3.1 (in/yd) 1 x Per Week/30 Days Discharge Instructions: Secure as directed. Wound #5 - Lower Leg Wound Laterality: Left, Anterior, Proximal Cleanser: Soap and Water 1 x Per Week/30 Days Discharge Instructions: May shower and wash wound with dial antibacterial soap and water prior to dressing change. Cleanser: Vashe 5.8 (oz) 1 x Per Week/30 Days Discharge Instructions: Cleanse the wound with Vashe prior to applying a clean dressing using gauze sponges, not tissue or cotton balls. Prim Dressing: Hydrofera Blue Ready Transfer Foam, 4x5 (in/in) 1 x Per Week/30 Days ary Discharge Instructions: Apply to wound bed as instructed Secondary Dressing: T Non-adherent Dressing, 2x3 in 1 x Per Week/30 Days elfa Discharge Instructions: Apply over primary dressing as directed. Secured With: Coban Self-Adherent Wrap 4x5 (in/yd) 1 x Per Week/30 Days Discharge Instructions: Secure with Coban as  directed. Secured With: [employer], 4.5x3.1 (in/yd) 1 x Per Week/30 Days Discharge Instructions: Secure with cotton as directed. Electronic Signature(s) Signed: 05/12/2023 12:23:22 PM By: Duanne Guess MD FACS Entered By: Duanne Guess on 05/12/2023 11:59:50 -------------------------------------------------------------------------------- Problem List Details Patient Name: Date of Service: [name] [name] 05/12/2023 11:00 A M Medical Record Number: [id number] Patient Account Number: [id number] Date of Birth/Sex: Treating RN: 11/26/1945 (77 y.o. F) Primary Care Provider: Dina Rich Other Clinician: Referring Provider: Treating Provider/Extender: Pincus Badder in Treatment: 15 Active Problems Ballinas, Corrissa  K ([id number]) [id number] Page 6 of 12 ICD-10 Encounter Code Description Active Date MDM Diagnosis L97.822 Non-pressure chronic ulcer of other part of left lower leg with fat layer exposed4/10/2022 No Yes I27.20 Pulmonary hypertension, unspecified 01/26/2023 No Yes I51.9 Heart disease, unspecified 01/26/2023 No Yes Inactive Problems Resolved Problems ICD-10 Code Description Active Date Resolved Date L97.122 Non-pressure chronic ulcer of left thigh with fat layer exposed 04/06/2023 04/06/2023 Electronic Signature(s) Signed: 05/12/2023 11:56:34 AM By: Duanne Guess MD FACS Entered By: Duanne Guess on 05/12/2023 11:56:34 -------------------------------------------------------------------------------- Progress Note Details Patient Name: Date of Service: [name] [name] 05/12/2023 11:00 A M Medical Record Number: [id number] Patient Account Number: [id number] Date of Birth/Sex: Treating RN: 10/08/1946 (77 y.o. F) Primary Care Provider: Dina Rich Other Clinician: Referring Provider: Treating Provider/Extender: Pincus Badder in Treatment: 15 Subjective Chief Complaint Information obtained  from Patient Patient seen for complaints of Non-Healing Wound. History of Present Illness (HPI) ADMISSION 02/19/2022 This is a 77 year old woman with minimal pertinent medical history. She does have COPD and pulmonary hypertension, but is not diabetic and is not a smoker. About 6 months ago, she and her husband got a new beagle puppy. The puppy scratched her leg and the scratches ultimately deteriorated into ulcerations. Apparently she sought care with a dermatologist who recommended applying a one-to-one mixture of peroxide and water to the wounds followed by a thick layer of Vaseline. She continue this for some time but then saw her primary care provider who told her to discontinue the peroxide. She has not been on any antibiotics for the scratches. Today, there are 3 separate wounds on her anterior tibial surface on the left. They are tender and her leg has localized swelling. There is yellow slough buildup in each of the wound bases. ABI in clinic today was normal at 1.16. 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 02/25/2022: Over the past week, the wounds themselves have not improved tremendously but she has noticed some stinging and the periwound skin is quite inflamed. I took a culture last week that had low levels of methicillin-resistant Staph aureus. Because it was fairly low level, I did not prescribe an oral antibiotic. I had planned to change her to mupirocin today, however. We have been using Santyl under Hydrofera Blue with 3 layer compression. 03/04/2022: The wounds have improved quite a bit over the past week. They are less painful and red. She has been taking the prescribed doxycycline but says that it gives her a stomachache. We have been using mupirocin with Hydrofera Blue and 3 layer compression. 03/11/2022: Continued improvement of the wounds. They have contracted quite a bit and have a good surface of healthy granulation tissue present. There is some dried  eschar present around all 3 of the lesions. 03/17/2022: No significant change in the wounds from last week. The surface is clean with just a little bit of eschar and slough accumulation. No odor or drainage. No concern for infection. KAMESHIA, MADRUGA ([id number]) [id number] Page 7 of 12 03/26/2022: All of her wounds are smaller this week. There is good granulation tissue on the surface of each. No slough accumulation. There is just some dry skin around the wounds but not actually involving the wounds. No concern for infection. 04/04/2022: The 2 proximal wounds have healed. The distal wound is much smaller and just has a little bit of dry eschar around the edges. 04/11/2022: Her wound is nearly healed. It is clean without concern for infection. 04/18/2022: Her wound  has healed. READMISSION 01/26/2023 About 8 weeks ago, she sustained another scratch from her Beagle on her left anterior tibial surface.. She has been trying to treat the wound at home with Vaseline. There is thick slough buildup on the wound surface and the periwound skin looks a bit macerated. No overt sign of infection. 01/29/2023: She came in for an unscheduled visit today because her wound began bleeding. It apparently bled quite profusely and her husband used an over-the- counter topical agent to get it to stop. On inspection today, he managed to achieve good hemostasis and there is no ongoing blood loss. 02/03/2023: No further issues with bleeding. There is minimal slough accumulation. Some hypertrophic granulation tissue present. 02/11/2023: She has had a lot more drainage over the past week and there has been more breakdown of the skin around her wound. The wound itself has expanded and there is quite a bit of slough on the surfaces. 02/18/2023: Her wound looks much better this week. The drainage has decreased and the periwound skin is in much improved condition. The wound is smaller and there is just 1 tiny  satellite adjacent to the main wound. There is slough accumulation on the surfaces. The culture that I took last week returned with methicillin sensitive Staph aureus. She is currently taking Keflex for this. 02/25/2023: Her wound is smaller again this week with minimal slough accumulation. The periwound looks significantly improved. She has completed her oral Keflex. 03/04/2023: The wound continues to contract. The periwound is completely healed and looks like normal intact skin. There is some slough on the surface of the wound overlying good granulation tissue. 03/11/2023: The wound measured about the same size today, although visually it appears smaller. There is a little bit of slough on the surface with good granulation tissue underneath. 03/17/2023: The wound is a little bit smaller today with light slough on the wound surface. No erythema, induration, or significant drainage. 03/30/2023: The wound is a little bit smaller again today with minimal slough and eschar accumulation. 04/06/2023: Her skin irritation from the Zetuvit dressing has gotten worse and she has had a fair amount of skin breakdown in the distribution of the bandage. This is also caused some breakdown of the existing wound. In addition, when she was at a wedding this weekend, she fell and suffered a large skin tear to her proximal left lower leg and left knee. The fat layer is exposed. There is slough accumulation on the surfaces. 04/13/2023: All of the wounds have deteriorated. They are red and angry-looking. There is slough on all of the surfaces. 04/20/2023: There has been significant improvement since last week. The erythema and irritation have resolved. There is minimal slough on the wound surfaces along with a little bit of light periwound eschar. She continues on Keflex. 04/27/2023: Continued improvement of her wounds. All of them are smaller. The knee wound is nearly closed. There is slough and eschar on all of the wound surfaces.  She has completed her course of oral antibiotics. 05/04/2023: The wounds are all smaller today with slough and eschar present on all of the surfaces. The moisture balance is leaning a bit towards the dry side, however. 05/12/2023: The wound on her knee is healed. The other skin tear at the tibial tuberosity is nearly closed with just a small open area. The lower leg wounds are about the same size. Moisture balance is better. There is slough on all of the open wound surfaces. Patient History Information obtained from Patient, Caregiver. Family  History Unknown History. Social History Former smoker, Marital Status - Married, Alcohol Use - Never, Drug Use - No History, Caffeine Use - Rarely. Medical History Eyes Patient has history of Cataracts - bil removed Denies history of Glaucoma, Optic Neuritis Ear/Nose/Mouth/Throat Denies history of Chronic sinus problems/congestion, Middle ear problems Hematologic/Lymphatic Patient has history of Anemia Respiratory Patient has history of Chronic Obstructive Pulmonary Disease (COPD) Cardiovascular Patient has history of Coronary Artery Disease, Peripheral Venous Disease - varicose veins Endocrine Denies history of Type I Diabetes, Type II Diabetes Genitourinary Denies history of End Stage Renal Disease Immunological Patient has history of Raynauds Denies history of Lupus Erythematosus, Scleroderma Integumentary (Skin) Denies history of History of Burn Musculoskeletal Patient has history of Rheumatoid Arthritis, Osteoarthritis Denies history of Gout Oncologic TENNILLE, MONTELONGO ([id number]) [id number] Page 8 of 12 Denies history of Received Chemotherapy, Received Radiation Psychiatric Denies history of Anorexia/bulimia, Confinement Anxiety Hospitalization/Surgery History - bil cataract removal. - left shoiulder replacement. - right rotator cuff repair. - multiple lumbar spine surgeries. - melanoma removed left arm and  chest. - partial excision bone left 2nd toe. Medical A Surgical History Notes nd Hematologic/Lymphatic varicose vein of leg, mixed hyperlipidemia Respiratory dyspnea, chronic bronchitis, pulmonary hypertension, pulmonary emphysema Cardiovascular aortic calcification, tachycardia, left ventricular dysfunction, bradycardia Gastrointestinal Gerd Endocrine prediabetes, hypothyroidism Genitourinary bladder disorder Integumentary (Skin) contact dermatitis Musculoskeletal acquired plantar porokeratosis, neurogenic claudication d/t lumbar spinal stenosis, supraspinatus tendinitis, restless leg syndrome, spinal stenosis of lumbar region Neurologic TIA, insomnia Objective Constitutional Slightly hypertensive. no acute distress. Vitals Time Taken: 11:21 AM, Height: 63 in, Weight: 115 lbs, BMI: 20.4, Temperature: 98.0 F, Pulse: 89 bpm, Respiratory Rate: 18 breaths/min, Blood Pressure: 145/75 mmHg. Respiratory Normal work of breathing on room air. General Notes: 05/12/2023: The wound on her knee is healed. The other skin tear at the tibial tuberosity is nearly closed with just a small open area. The lower leg wounds are about the same size. Moisture balance is better. There is slough on all of the open wound surfaces. Integumentary (Hair, Skin) Wound #4 status is Open. Original cause of wound was Trauma. The date acquired was: 11/27/2022. The wound has been in treatment 15 weeks. The wound is located on the Left,Anterior Lower Leg. The wound measures 8cm length x 7cm width x 0.1cm depth; 43.982cm^2 area and 4.398cm^3 volume. There is Fat Layer (Subcutaneous Tissue) exposed. There is no tunneling or undermining noted. There is a medium amount of serosanguineous drainage noted. The wound margin is distinct with the outline attached to the wound base. There is medium (34-66%) red, pink granulation within the wound bed. There is a medium (34-66%) amount of necrotic tissue within the wound bed  including Eschar and Adherent Slough. The periwound skin appearance had no abnormalities noted for moisture. The periwound skin appearance exhibited: Scarring, Hemosiderin Staining. The periwound skin appearance did not exhibit: Callus, Crepitus, Excoriation, Induration, Rash, Atrophie Blanche, Cyanosis, Ecchymosis, Mottled, Pallor, Rubor, Erythema. Periwound temperature was noted as No Abnormality. Wound #5 status is Open. Original cause of wound was Skin T ear/Laceration. The date acquired was: 04/02/2023. The wound has been in treatment 5 weeks. The wound is located on the Left,Proximal,Anterior Lower Leg. The wound measures 0.5cm length x 0.5cm width x 0.1cm depth; 0.196cm^2 area and 0.02cm^3 volume. There is Fat Layer (Subcutaneous Tissue) exposed. There is no tunneling or undermining noted. There is a medium amount of serosanguineous drainage noted. The wound margin is distinct with the outline attached to the wound base. There is large (67-100%)  red, hyper - granulation within the wound bed. There is a small (1-33%) amount of necrotic tissue within the wound bed including Eschar and Adherent Slough. The periwound skin appearance had no abnormalities noted for texture. The periwound skin appearance had no abnormalities noted for moisture. The periwound skin appearance exhibited: Rubor. The periwound skin appearance did not exhibit: Ecchymosis. Periwound temperature was noted as No Abnormality. Wound #6 status is Healed - Epithelialized. Original cause of wound was Skin T ear/Laceration. The date acquired was: 04/02/2023. The wound has been in treatment 5 weeks. The wound is located on the Left Knee. The wound measures 0cm length x 0cm width x 0cm depth; 0cm^2 area and 0cm^3 volume. There is no tunneling or undermining noted. There is a none present amount of drainage noted. The wound margin is distinct with the outline attached to the wound base. There is no granulation within the wound bed. There is  no necrotic tissue within the wound bed. The periwound skin appearance had no abnormalities noted for moisture. The periwound skin appearance had no abnormalities noted for color. The periwound skin appearance did not exhibit: Scarring. Periwound temperature was noted as No Abnormality. Assessment Active Problems ICD-10 Non-pressure chronic ulcer of other part of left lower leg with fat layer exposed Pulmonary hypertension, unspecified Heart disease, unspecified Mantei, Freddie K ([id number]) [id number] Page 9 of 12 Procedures Wound #4 Pre-procedure diagnosis of Wound #4 is a Venous Leg Ulcer located on the Left,Anterior Lower Leg .Severity of Tissue Pre Debridement is: Fat layer exposed. There was a Selective/Open Wound Non-Viable Tissue Debridement with a total area of 43.96 sq cm performed by Duanne Guess, MD. With the following instrument(s): Curette to remove Non-Viable tissue/material. Material removed includes [facility] after achieving pain control using Lidocaine 5% topical ointment. No specimens were taken. A time out was conducted at 11:45, prior to the start of the procedure. A Minimum amount of bleeding was controlled with Pressure. The procedure was tolerated well with a pain level of 0 throughout and a pain level of 0 following the procedure. Post Debridement Measurements: 8cm length x 7cm width x 0.1cm depth; 4.398cm^3 volume. Character of Wound/Ulcer Post Debridement is improved. Severity of Tissue Post Debridement is: Fat layer exposed. Post procedure Diagnosis Wound #4: Same as Pre-Procedure General Notes: Scribed for Dr. Lady Gary by J.Scotton. Wound #5 Pre-procedure diagnosis of Wound #5 is a Skin T located on the Left,Proximal,Anterior Lower Leg . There was a Selective/Open Wound Non-Viable Tissue ear Debridement with a total area of 0.2 sq cm performed by Duanne Guess, MD. With the following instrument(s): Curette to remove  Non-Viable tissue/material. Material removed includes [facility] after achieving pain control using Lidocaine 5% topical ointment. No specimens were taken. A time out was conducted at 11:45, prior to the start of the procedure. A Minimum amount of bleeding was controlled with Pressure. The procedure was tolerated well with a pain level of 0 throughout and a pain level of 0 following the procedure. Post Debridement Measurements: 0.5cm length x 0.5cm width x 0.1cm depth; 0.02cm^3 volume. Character of Wound/Ulcer Post Debridement is improved. Post procedure Diagnosis Wound #5: Same as Pre-Procedure General Notes: Scribed for Dr. Lady Gary by J.Scotton. Plan Follow-up Appointments: Return Appointment in 1 week. - Dr. Lady Gary Room 1 Wed. 05/20/23 at 8:15am Anesthetic: (In clinic) Topical Lidocaine 4% applied to wound bed Bathing/ Shower/ Hygiene: May shower with protection but do not get wound dressing(s) wet. Protect dressing(s) with water repellant cover (for example, large plastic bag) or a cast  cover and may then take shower. Edema Control - Lymphedema / SCD / Other: Elevate legs to the level of the heart or above for 30 minutes daily and/or when sitting for 3-4 times a day throughout the day. Avoid standing for long periods of time. Exercise regularly If compression wraps slide down please call wound center and speak with a nurse. WOUND #4: - Lower Leg Wound Laterality: Left, Anterior Cleanser: Soap and Water 1 x Per Week/30 Days Discharge Instructions: May shower and wash wound with dial antibacterial soap and water prior to dressing change. Cleanser: Vashe 5.8 (oz) 1 x Per Week/30 Days Discharge Instructions: Cleanse the wound with Vashe prior to applying a clean dressing using gauze sponges, not tissue or cotton balls. Prim Dressing: Hydrofera Blue Ready Transfer Foam, 4x5 (in/in) 1 x Per Week/30 Days ary Discharge Instructions: Apply to wound bed as instructed Secondary Dressing: T  Non-adherent Dressing, 2x3 in 1 x Per Week/30 Days elfa Discharge Instructions: Apply over primary dressing as directed. Secured With: Coban Self-Adherent Wrap 4x5 (in/yd) 1 x Per Week/30 Days Discharge Instructions: Secure with Coban as directed. Secured With: [employer], 4.5x3.1 (in/yd) 1 x Per Week/30 Days Discharge Instructions: Secure as directed. WOUND #5: - Lower Leg Wound Laterality: Left, Anterior, Proximal Cleanser: Soap and Water 1 x Per Week/30 Days Discharge Instructions: May shower and wash wound with dial antibacterial soap and water prior to dressing change. Cleanser: Vashe 5.8 (oz) 1 x Per Week/30 Days Discharge Instructions: Cleanse the wound with Vashe prior to applying a clean dressing using gauze sponges, not tissue or cotton balls. Prim Dressing: Hydrofera Blue Ready Transfer Foam, 4x5 (in/in) 1 x Per Week/30 Days ary Discharge Instructions: Apply to wound bed as instructed Secondary Dressing: T Non-adherent Dressing, 2x3 in 1 x Per Week/30 Days elfa Discharge Instructions: Apply over primary dressing as directed. Secured With: Coban Self-Adherent Wrap 4x5 (in/yd) 1 x Per Week/30 Days Discharge Instructions: Secure with Coban as directed. Secured With: [employer], 4.5x3.1 (in/yd) 1 x Per Week/30 Days Discharge Instructions: Secure with cotton as directed. 05/12/2023: The wound on her knee is healed. The other skin tear at the tibial tuberosity is nearly closed with just a small open area. The lower leg wounds are about the same size. Moisture balance is better. There is slough on all of the open wound surfaces. I used a curette to debride the slough from each of the wound surfaces. We will continue Prisma silver collagen to all wound sites with periwound triamcinolone, cotton and Coban wrap. Follow-up in 1 week. Electronic Signature(s) Signed: 05/12/2023 12:01:15 PM By: Duanne Guess MD FACS [place], [name] ([id number])  [id number] Page 10 of 12 Entered By: Duanne Guess on 05/12/2023 12:01:14 -------------------------------------------------------------------------------- HxROS Details Patient Name: Date of Service: Long, Elaine [name] 05/12/2023 11:00 A M Medical Record Number: [id number] Patient Account Number: [id number] Date of Birth/Sex: Treating RN: 1946-02-23 (77 y.o. F) Primary Care Provider: Dina Rich Other Clinician: Referring Provider: Treating Provider/Extender: Pincus Badder in Treatment: 15 Information Obtained From Patient Caregiver Eyes Medical History: Positive for: Cataracts - bil removed Negative for: Glaucoma; Optic Neuritis Ear/Nose/Mouth/Throat Medical History: Negative for: Chronic sinus problems/congestion; Middle ear problems Hematologic/Lymphatic Medical History: Positive for: Anemia Past Medical History Notes: varicose vein of leg, mixed hyperlipidemia Respiratory Medical History: Positive for: Chronic Obstructive Pulmonary Disease (COPD) Past Medical History Notes: dyspnea, chronic bronchitis, pulmonary hypertension, pulmonary emphysema Cardiovascular Medical History: Positive for: Coronary Artery Disease; Peripheral Venous Disease - varicose veins  Past Medical History Notes: aortic calcification, tachycardia, left ventricular dysfunction, bradycardia Gastrointestinal Medical History: Past Medical History Notes: Gerd Endocrine Medical History: Negative for: Type I Diabetes; Type II Diabetes Past Medical History Notes: prediabetes, hypothyroidism Genitourinary Medical History: Negative for: End Stage Renal Disease Past Medical History Notes: bladder disorder Immunological Medical History: Positive for: Raynauds Negative for: Lupus Erythematosus; Scleroderma Kemler, Kinya K ([id number]) [id number] Page 11 of 12 Integumentary (Skin) Medical History: Negative for: History  of Burn Past Medical History Notes: contact dermatitis Musculoskeletal Medical History: Positive for: Rheumatoid Arthritis; Osteoarthritis Negative for: Gout Past Medical History Notes: acquired plantar porokeratosis, neurogenic claudication d/t lumbar spinal stenosis, supraspinatus tendinitis, restless leg syndrome, spinal stenosis of lumbar region Neurologic Medical History: Past Medical History Notes: TIA, insomnia Oncologic Medical History: Negative for: Received Chemotherapy; Received Radiation Psychiatric Medical History: Negative for: Anorexia/bulimia; Confinement Anxiety HBO Extended History Items Eyes: Cataracts Immunizations Pneumococcal Vaccine: Received Pneumococcal Vaccination: Yes Received Pneumococcal Vaccination On or After 60th Birthday: Yes Implantable Devices None Hospitalization / Surgery History Type of Hospitalization/Surgery bil cataract removal left shoiulder replacement right rotator cuff repair multiple lumbar spine surgeries melanoma removed left arm and chest partial excision bone left 2nd toe Family and Social History Unknown History: Yes; Former smoker; Marital Status - Married; Alcohol Use: Never; Drug Use: No History; Caffeine Use: Rarely; Financial Concerns: No; Food, Clothing or Shelter Needs: No; Support System Lacking: No; Transportation Concerns: No Electronic Signature(s) Signed: 05/12/2023 12:23:22 PM By: Duanne Guess MD FACS Entered By: Duanne Guess on 05/12/2023 11:58:34 -------------------------------------------------------------------------------- SuperBill Details Patient Name: Date of Service: [name] [name] 05/12/2023 Medical Record Number: [id number] Patient Account Number: [id number] Date of Birth/Sex: Treating RN: 1946-10-05 (77 y.o. F) Primary Care Provider: Dina Rich Other Clinician: Referring Provider: Treating Provider/Extender: Cahterine, Heinzel, [name] ([id number])  [id number] Page 12 of 12 Weeks in Treatment: 15 Diagnosis Coding ICD-10 Codes Code Description [id number] Non-pressure chronic ulcer of other part of left lower leg with fat layer exposed I27.20 Pulmonary hypertension, unspecified I51.9 Heart disease, unspecified Facility Procedures : CPT4 Code: [id number] Description: 97597 - DEBRIDE WOUND 1ST 20 SQ CM OR < ICD-10 Diagnosis Description L97.822 Non-pressure chronic ulcer of other part of left lower leg with fat layer expose Modifier: d Quantity: 1 Physician Procedures : CPT4 Code Description Modifier [id number] - WC PHYS LEVEL 3 - EST PT 25 ICD-10 Diagnosis Description L97.822 Non-pressure chronic ulcer of other part of left lower leg with fat layer exposed I27.20 Pulmonary hypertension, unspecified I51.9 Heart  disease, unspecified Quantity: 1 : [id number] 97597 - WC PHYS DEBR WO ANESTH 20 SQ CM ICD-10 Diagnosis Description L97.822 Non-pressure chronic ulcer of other part of left lower leg with fat layer exposed Quantity: 1 Electronic Signature(s) Signed: 05/12/2023 12:01:47 PM By: Duanne Guess MD FACS Entered By: Duanne Guess on 05/12/2023 12:01:46
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
## 2023-05-20 ENCOUNTER — Encounter (HOSPITAL_BASED_OUTPATIENT_CLINIC_OR_DEPARTMENT_OTHER): Payer: PPO | Admitting: General Surgery

## 2023-05-20 DIAGNOSIS — L97822 Non-pressure chronic ulcer of other part of left lower leg with fat layer exposed: Secondary | ICD-10-CM | POA: Diagnosis not present

## 2023-05-20 NOTE — Progress Notes (Signed)
DIAVIAN, FURGASON ([id number]) [id number] Page 1 of 12 Visit Report for 05/20/2023 Chief Complaint Document Details Patient Name: Date of Service: [name], [name] RMA Long. 05/20/2023 8:15 A M Medical Record Number: [id number] Patient Account Number: [id number] Date of Birth/Sex: Treating RN: 09/14/1946 (77 y.o. F) Primary Care Provider: Dina Rich Other Clinician: Referring Provider: Treating Provider/Extender: Pincus Badder in Treatment: 16 Information Obtained from: Patient Chief Complaint Patient seen for complaints of Non-Healing Wound. Electronic Signature(s) Signed: 05/20/2023 9:01:54 AM By: Duanne Guess MD FACS Entered By: Duanne Guess on 05/20/2023 09:01:54 -------------------------------------------------------------------------------- Debridement Details Patient Name: Date of Service: Elaine Hurst RMA Long. 05/20/2023 8:15 A M Medical Record Number: [id number] Patient Account Number: [id number] Date of Birth/Sex: Treating RN: 07-31-1946 (77 y.o. [name] Primary Care Provider: Dina Rich Other Clinician: Referring Provider: Treating Provider/Extender: Pincus Badder in Treatment: 16 Debridement Performed for Assessment: Wound #4 Left,Anterior Lower Leg Performed By: Physician Duanne Guess, MD Debridement Type: Debridement Severity of Tissue Pre Debridement: Fat layer exposed Level of Consciousness (Pre-procedure): Awake and Alert Pre-procedure Verification/Time Out Yes - 08:45 Taken: Start Time: 08:48 Pain Control: Lidocaine 4% T opical Solution Percent of Wound Bed Debrided: 50% T Area Debrided (cm): otal 11.78 Tissue and other material debrided: Non-Viable, Slough, Slough Level: Non-Viable Tissue Debridement Description: Selective/Open Wound Instrument: Curette Bleeding: Minimum Hemostasis Achieved: Pressure Procedural Pain: 2 Post Procedural Pain: 1 Response to Treatment: Procedure  was tolerated well Level of Consciousness (Post- Awake and Alert procedure): Post Debridement Measurements of Total Wound Length: (cm) 7.5 Width: (cm) 4 Depth: (cm) 0.1 Volume: (cm) 2.356 Character of Wound/Ulcer Post Debridement: Improved Severity of Tissue Post Debridement: Fat layer exposed [name], [name] ([id number]) [phone].pdf Page 2 of 12 Post Procedure Diagnosis Same as Pre-procedure Notes Scribed for Dr. Lady Gary by Zenaida Deed, RN Electronic Signature(s) Signed: 05/20/2023 9:34:18 AM By: Duanne Guess MD FACS Signed: 05/20/2023 5:09:00 PM By: Zenaida Deed RN, BSN Entered By: Zenaida Deed on 05/20/2023 08:51:27 -------------------------------------------------------------------------------- HPI Details Patient Name: Date of Service: Elaine Hurst RMA Long. 05/20/2023 8:15 A M Medical Record Number: [id number] Patient Account Number: [id number] Date of Birth/Sex: Treating RN: 10-16-1946 (77 y.o. F) Primary Care Provider: Dina Rich Other Clinician: Referring Provider: Treating Provider/Extender: Pincus Badder in Treatment: 16 History of Present Illness HPI Description: ADMISSION 02/19/2022 This is a 77 year old woman with minimal pertinent medical history. She does have COPD and pulmonary hypertension, but is not diabetic and is not a smoker. About 6 months ago, she and her husband got a new beagle puppy. The puppy scratched her leg and the scratches ultimately deteriorated into ulcerations. Apparently she sought care with a dermatologist who recommended applying a one-to-one mixture of peroxide and water to the wounds followed by a thick layer of Vaseline. She continue this for some time but then saw her primary care provider who told her to discontinue the peroxide. She has not been on any antibiotics for the scratches. Today, there are 3 separate wounds on her anterior tibial surface on the left. They are tender and her  leg has localized swelling. There is yellow slough buildup in each of the wound bases. ABI in clinic today was normal at 1.16. 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 02/25/2022: Over the past week, the wounds themselves have not improved tremendously but she has noticed some stinging and the periwound skin is quite inflamed. I took a culture last week that had low levels of methicillin-resistant Staph  aureus. Because it was fairly low level, I did not prescribe an oral antibiotic. I had planned to change her to mupirocin today, however. We have been using Santyl under Hydrofera Blue with 3 layer compression. 03/04/2022: The wounds have improved quite a bit over the past week. They are less painful and red. She has been taking the prescribed doxycycline but says that it gives her a stomachache. We have been using mupirocin with Hydrofera Blue and 3 layer compression. 03/11/2022: Continued improvement of the wounds. They have contracted quite a bit and have a good surface of healthy granulation tissue present. There is some dried eschar present around all 3 of the lesions. 03/17/2022: Elaine significant change in the wounds from last week. The surface is clean with just a little bit of eschar and slough accumulation. Elaine odor or drainage. Elaine concern for infection. 03/26/2022: All of her wounds are smaller this week. There is good granulation tissue on the surface of each. Elaine slough accumulation. There is just some dry skin around the wounds but not actually involving the wounds. Elaine concern for infection. 04/04/2022: The 2 proximal wounds have healed. The distal wound is much smaller and just has a little bit of dry eschar around the edges. 04/11/2022: Her wound is nearly healed. It is clean without concern for infection. 04/18/2022: Her wound has healed. READMISSION 01/26/2023 About 8 weeks ago, she sustained another scratch from her Beagle on her left anterior tibial surface.. She has  been trying to treat the wound at home with Vaseline. There is thick slough buildup on the wound surface and the periwound skin looks a bit macerated. Elaine overt sign of infection. 01/29/2023: She came in for an unscheduled visit today because her wound began bleeding. It apparently bled quite profusely and her husband used an over-the- counter topical agent to get it to stop. On inspection today, he managed to achieve good hemostasis and there is Elaine ongoing blood loss. 02/03/2023: Elaine further issues with bleeding. There is minimal slough accumulation. Some hypertrophic granulation tissue present. 02/11/2023: She has had a lot more drainage over the past week and there has been more breakdown of the skin around her wound. The wound itself has expanded and there is quite a bit of slough on the surfaces. 02/18/2023: Her wound looks much better this week. The drainage has decreased and the periwound skin is in much improved condition. The wound is smaller and there is just 1 tiny satellite adjacent to the main wound. There is slough accumulation on the surfaces. The culture that I took last week returned with methicillin sensitive Staph aureus. She is currently taking Keflex for this. 02/25/2023: Her wound is smaller again this week with minimal slough accumulation. The periwound looks significantly improved. She has completed her oral Keflex. [name], [name] ([id number]) [id number] Page 3 of 12 03/04/2023: The wound continues to contract. The periwound is completely healed and looks like normal intact skin. There is some slough on the surface of the wound overlying good granulation tissue. 03/11/2023: The wound measured about the same size today, although visually it appears smaller. There is a little bit of slough on the surface with good granulation tissue underneath. 03/17/2023: The wound is a little bit smaller today with light slough on the wound surface. Elaine erythema, induration, or  significant drainage. 03/30/2023: The wound is a little bit smaller again today with minimal slough and eschar accumulation. 04/06/2023: Her skin irritation from the Zetuvit dressing has gotten worse and she has had  a fair amount of skin breakdown in the distribution of the bandage. This is also caused some breakdown of the existing wound. In addition, when she was at a wedding this weekend, she fell and suffered a large skin tear to her proximal left lower leg and left knee. The fat layer is exposed. There is slough accumulation on the surfaces. 04/13/2023: All of the wounds have deteriorated. They are red and angry-looking. There is slough on all of the surfaces. 04/20/2023: There has been significant improvement since last week. The erythema and irritation have resolved. There is minimal slough on the wound surfaces along with a little bit of light periwound eschar. She continues on Keflex. 04/27/2023: Continued improvement of her wounds. All of them are smaller. The knee wound is nearly closed. There is slough and eschar on all of the wound surfaces. She has completed her course of oral antibiotics. 05/04/2023: The wounds are all smaller today with slough and eschar present on all of the surfaces. The moisture balance is leaning a bit towards the dry side, however. 05/12/2023: The wound on her knee is healed. The other skin tear at the tibial tuberosity is nearly closed with just a small open area. The lower leg wounds are about the same size. Moisture balance is better. There is slough on all of the open wound surfaces. 05/20/2023: The wounds measured a little bit smaller today. They have slough accumulation and the patient says everything is quite a bit more tender. There is more periwound erythema present today. Electronic Signature(s) Signed: 05/20/2023 9:02:54 AM By: Duanne Guess MD FACS Entered By: Duanne Guess on 05/20/2023  09:02:54 -------------------------------------------------------------------------------- Physical Exam Details Patient Name: Date of Service: Elaine Hurst RMA Long. 05/20/2023 8:15 A M Medical Record Number: [id number] Patient Account Number: [id number] Date of Birth/Sex: Treating RN: 1946/10/07 (77 y.o. F) Primary Care Provider: Dina Rich Other Clinician: Referring Provider: Treating Provider/Extender: Pincus Badder in Treatment: 16 Constitutional . . . . Elaine acute distress. Respiratory Normal work of breathing on room air. Notes 05/20/2023: The wounds measured a little bit smaller today. They have slough accumulation and the patient says everything is quite a bit more tender. There is more periwound erythema present today. Electronic Signature(s) Signed: 05/20/2023 9:03:20 AM By: Duanne Guess MD FACS Entered By: Duanne Guess on 05/20/2023 09:03:20 -------------------------------------------------------------------------------- Physician Orders Details Patient Name: Date of Service: Elaine Hurst RMA Long. 05/20/2023 8:15 A M Medical Record Number: [id number] Patient Account Number: [id number] Date of Birth/Sex: Treating RN: May 28, 1946 (77 y.o. [address] [place], [place] Long ([id number]) [id number] Page 4 of 12 Primary Care Provider: Dina Rich Other Clinician: Referring Provider: Treating Provider/Extender: Pincus Badder in Treatment: 16 Verbal / Phone Orders: Elaine Diagnosis Coding ICD-10 Coding Code Description [phone] Non-pressure chronic ulcer of other part of left lower leg with fat layer exposed I27.20 Pulmonary hypertension, unspecified I51.9 Heart disease, unspecified Follow-up Appointments ppointment in 1 week. - Dr. Lady Gary Room 1 Wed. 05/20/23 at 8:15am Return A Anesthetic Wound #4 Left,Anterior Lower Leg (In clinic) Topical Lidocaine 4% applied to wound bed Wound #5 Left,Proximal,Anterior  Lower Leg (In clinic) Topical Lidocaine 4% applied to wound bed Bathing/ Shower/ Hygiene May shower and wash wound with soap and water. - with dressing changes Edema Control - Lymphedema / SCD / Other Elevate legs to the level of the heart or above for 30 minutes daily and/or when sitting for 3-4 times a day throughout the day. A void standing for long periods of  time. Exercise regularly If compression wraps slide down please call wound center and speak with a nurse. Wound Treatment Wound #4 - Lower Leg Wound Laterality: Left, Anterior Cleanser: Soap and Water 1 x Per Week/30 Days Discharge Instructions: May shower and wash wound with dial antibacterial soap and water prior to dressing change. Cleanser: Vashe 5.8 (oz) 1 x Per Week/30 Days Discharge Instructions: Cleanse the wound with Vashe prior to applying a clean dressing using gauze sponges, not tissue or cotton balls. Peri-Wound Care: Triamcinolone 15 (g) 1 x Per Week/30 Days Discharge Instructions: Use triamcinolone 15 (g) as directed Peri-Wound Care: Sween Lotion (Moisturizing lotion) 1 x Per Week/30 Days Discharge Instructions: Apply moisturizing lotion as directed Prim Dressing: MediHoney Gel, tube 1.5 (oz) 1 x Per Week/30 Days ary Discharge Instructions: Apply to wound bed as instructed Secondary Dressing: T Non-adherent Dressing, 2x3 in 1 x Per Week/30 Days elfa Discharge Instructions: Apply over primary dressing as directed. Compression Wrap: Kerlix Roll 4.5x3.1 (in/yd) 1 x Per Week/30 Days Discharge Instructions: Apply Kerlix and Coban compression as directed. Compression Wrap: Coban Self-Adherent Wrap 4x5 (in/yd) 1 x Per Week/30 Days Discharge Instructions: Apply over Kerlix as directed. Wound #5 - Lower Leg Wound Laterality: Left, Anterior, Proximal Cleanser: Soap and Water 1 x Per Week/30 Days Discharge Instructions: May shower and wash wound with dial antibacterial soap and water prior to dressing change. Cleanser:  Vashe 5.8 (oz) 1 x Per Week/30 Days Discharge Instructions: Cleanse the wound with Vashe prior to applying a clean dressing using gauze sponges, not tissue or cotton balls. Peri-Wound Care: Triamcinolone 15 (g) 1 x Per Week/30 Days Discharge Instructions: Use triamcinolone 15 (g) as directed Peri-Wound Care: Sween Lotion (Moisturizing lotion) 1 x Per Week/30 Days Discharge Instructions: Apply moisturizing lotion as directed Prim Dressing: MediHoney Gel, tube 1.5 (oz) 1 x Per Week/30 Days ary Discharge Instructions: Apply to wound bed as instructed Secondary Dressing: T Non-adherent Dressing, 2x3 in 1 x Per Week/30 Days elfa Discharge Instructions: Apply over primary dressing as directed. [name], [name] ([id number]) [id number] Page 5 of 12 Compression Wrap: Kerlix Roll 4.5x3.1 (in/yd) 1 x Per Week/30 Days Discharge Instructions: Apply Kerlix and Coban compression as directed. Compression Wrap: Coban Self-Adherent Wrap 4x5 (in/yd) 1 x Per Week/30 Days Discharge Instructions: Apply over Kerlix as directed. Patient Medications llergies: Sulfa (Sulfonamide Antibiotics), adhesive, amoxicillin, ciprofloxacin, Statins-Hmg-Coa Reductase Inhibitors A Notifications Medication Indication Start End 05/20/2023 cephalexin DOSE oral 500 mg capsule - 1 capsule p.o. 4 times daily x 7 days Electronic Signature(s) Signed: 05/20/2023 9:34:18 AM By: Duanne Guess MD FACS Previous Signature: 05/20/2023 9:04:34 AM Version By: Duanne Guess MD FACS Entered By: Duanne Guess on 05/20/2023 09:04:45 -------------------------------------------------------------------------------- Problem List Details Patient Name: Date of Service: Elaine Hurst RMA Long. 05/20/2023 8:15 A M Medical Record Number: [id number] Patient Account Number: [id number] Date of Birth/Sex: Treating RN: April 26, 1946 (77 y.o. [name] Primary Care Provider: Dina Rich Other Clinician: Referring  Provider: Treating Provider/Extender: Pincus Badder in Treatment: 16 Active Problems ICD-10 Encounter Code Description Active Date MDM Diagnosis [phone] Non-pressure chronic ulcer of other part of left lower leg with fat layer exposed4/10/2022 Elaine Yes I27.20 Pulmonary hypertension, unspecified 01/26/2023 Elaine Yes I51.9 Heart disease, unspecified 01/26/2023 Elaine Yes Inactive Problems Resolved Problems ICD-10 Code Description Active Date Resolved Date L97.122 Non-pressure chronic ulcer of left thigh with fat layer exposed 04/06/2023 04/06/2023 Electronic Signature(s) Signed: 05/20/2023 9:01:38 AM By: Duanne Guess MD FACS Entered By: Duanne Guess on 05/20/2023 09:01:38 Metoyer, Jadesola Long (  [id number]) [id number] Page 6 of 12 -------------------------------------------------------------------------------- Progress Note Details Patient Name: Date of Service: [name], [name] RMA Long. 05/20/2023 8:15 A M Medical Record Number: [id number] Patient Account Number: [id number] Date of Birth/Sex: Treating RN: [birth year] (77 y.o. F) Primary Care Provider: Dina Rich Other Clinician: Referring Provider: Treating Provider/Extender: Pincus Badder in Treatment: 16 Subjective Chief Complaint Information obtained from Patient Patient seen for complaints of Non-Healing Wound. History of Present Illness (HPI) ADMISSION 02/19/2022 This is a 77 year old woman with minimal pertinent medical history. She does have COPD and pulmonary hypertension, but is not diabetic and is not a smoker. About 6 months ago, she and her husband got a new beagle puppy. The puppy scratched her leg and the scratches ultimately deteriorated into ulcerations. Apparently she sought care with a dermatologist who recommended applying a one-to-one mixture of peroxide and water to the wounds followed by a thick layer of Vaseline. She continue this for some time but then saw  her primary care provider who told her to discontinue the peroxide. She has not been on any antibiotics for the scratches. Today, there are 3 separate wounds on her anterior tibial surface on the left. They are tender and her leg has localized swelling. There is yellow slough buildup in each of the wound bases. ABI in clinic today was normal at 1.16. 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 02/25/2022: Over the past week, the wounds themselves have not improved tremendously but she has noticed some stinging and the periwound skin is quite inflamed. I took a culture last week that had low levels of methicillin-resistant Staph aureus. Because it was fairly low level, I did not prescribe an oral antibiotic. I had planned to change her to mupirocin today, however. We have been using Santyl under Hydrofera Blue with 3 layer compression. 03/04/2022: The wounds have improved quite a bit over the past week. They are less painful and red. She has been taking the prescribed doxycycline but says that it gives her a stomachache. We have been using mupirocin with Hydrofera Blue and 3 layer compression. 03/11/2022: Continued improvement of the wounds. They have contracted quite a bit and have a good surface of healthy granulation tissue present. There is some dried eschar present around all 3 of the lesions. 03/17/2022: Elaine significant change in the wounds from last week. The surface is clean with just a little bit of eschar and slough accumulation. Elaine odor or drainage. Elaine concern for infection. 03/26/2022: All of her wounds are smaller this week. There is good granulation tissue on the surface of each. Elaine slough accumulation. There is just some dry skin around the wounds but not actually involving the wounds. Elaine concern for infection. 04/04/2022: The 2 proximal wounds have healed. The distal wound is much smaller and just has a little bit of dry eschar around the edges. 04/11/2022: Her wound is  nearly healed. It is clean without concern for infection. 04/18/2022: Her wound has healed. READMISSION 01/26/2023 About 8 weeks ago, she sustained another scratch from her Beagle on her left anterior tibial surface.. She has been trying to treat the wound at home with Vaseline. There is thick slough buildup on the wound surface and the periwound skin looks a bit macerated. Elaine overt sign of infection. 01/29/2023: She came in for an unscheduled visit today because her wound began bleeding. It apparently bled quite profusely and her husband used an over-the- counter topical agent to get it to stop. On inspection today,  he managed to achieve good hemostasis and there is Elaine ongoing blood loss. 02/03/2023: Elaine further issues with bleeding. There is minimal slough accumulation. Some hypertrophic granulation tissue present. 02/11/2023: She has had a lot more drainage over the past week and there has been more breakdown of the skin around her wound. The wound itself has expanded and there is quite a bit of slough on the surfaces. 02/18/2023: Her wound looks much better this week. The drainage has decreased and the periwound skin is in much improved condition. The wound is smaller and there is just 1 tiny satellite adjacent to the main wound. There is slough accumulation on the surfaces. The culture that I took last week returned with methicillin sensitive Staph aureus. She is currently taking Keflex for this. 02/25/2023: Her wound is smaller again this week with minimal slough accumulation. The periwound looks significantly improved. She has completed her oral Keflex. 03/04/2023: The wound continues to contract. The periwound is completely healed and looks like normal intact skin. There is some slough on the surface of the wound overlying good granulation tissue. 03/11/2023: The wound measured about the same size today, although visually it appears smaller. There is a little bit of slough on the surface with  good granulation tissue underneath. 03/17/2023: The wound is a little bit smaller today with light slough on the wound surface. Elaine erythema, induration, or significant drainage. 03/30/2023: The wound is a little bit smaller again today with minimal slough and eschar accumulation. 04/06/2023: Her skin irritation from the Zetuvit dressing has gotten worse and she has had a fair amount of skin breakdown in the distribution of the bandage. This is also caused some breakdown of the existing wound. In addition, when she was at a wedding this weekend, she fell and suffered a large skin tear to her [name], [name] ([id number]) [id number] Page 7 of 12 proximal left lower leg and left knee. The fat layer is exposed. There is slough accumulation on the surfaces. 04/13/2023: All of the wounds have deteriorated. They are red and angry-looking. There is slough on all of the surfaces. 04/20/2023: There has been significant improvement since last week. The erythema and irritation have resolved. There is minimal slough on the wound surfaces along with a little bit of light periwound eschar. She continues on Keflex. 04/27/2023: Continued improvement of her wounds. All of them are smaller. The knee wound is nearly closed. There is slough and eschar on all of the wound surfaces. She has completed her course of oral antibiotics. 05/04/2023: The wounds are all smaller today with slough and eschar present on all of the surfaces. The moisture balance is leaning a bit towards the dry side, however. 05/12/2023: The wound on her knee is healed. The other skin tear at the tibial tuberosity is nearly closed with just a small open area. The lower leg wounds are about the same size. Moisture balance is better. There is slough on all of the open wound surfaces. 05/20/2023: The wounds measured a little bit smaller today. They have slough accumulation and the patient says everything is quite a bit more tender. There  is more periwound erythema present today. Patient History Information obtained from Patient, Caregiver. Family History Unknown History. Social History Former smoker, Marital Status - Married, Alcohol Use - Never, Drug Use - Elaine History, Caffeine Use - Rarely. Medical History Eyes Patient has history of Cataracts - bil removed Denies history of Glaucoma, Optic Neuritis Ear/Nose/Mouth/Throat Denies history of Chronic sinus problems/congestion, Middle ear problems  Hematologic/Lymphatic Patient has history of Anemia Respiratory Patient has history of Chronic Obstructive Pulmonary Disease (COPD) Cardiovascular Patient has history of Coronary Artery Disease, Peripheral Venous Disease - varicose veins Endocrine Denies history of Type I Diabetes, Type II Diabetes Genitourinary Denies history of End Stage Renal Disease Immunological Patient has history of Raynauds Denies history of Lupus Erythematosus, Scleroderma Integumentary (Skin) Denies history of History of Burn Musculoskeletal Patient has history of Rheumatoid Arthritis, Osteoarthritis Denies history of Gout Oncologic Denies history of Received Chemotherapy, Received Radiation Psychiatric Denies history of Anorexia/bulimia, Confinement Anxiety Hospitalization/Surgery History - bil cataract removal. - left shoiulder replacement. - right rotator cuff repair. - multiple lumbar spine surgeries. - melanoma removed left arm and chest. - partial excision bone left 2nd toe. Medical A Surgical History Notes nd Hematologic/Lymphatic varicose vein of leg, mixed hyperlipidemia Respiratory dyspnea, chronic bronchitis, pulmonary hypertension, pulmonary emphysema Cardiovascular aortic calcification, tachycardia, left ventricular dysfunction, bradycardia Gastrointestinal Gerd Endocrine prediabetes, hypothyroidism Genitourinary bladder disorder Integumentary (Skin) contact dermatitis Musculoskeletal acquired plantar porokeratosis,  neurogenic claudication d/t lumbar spinal stenosis, supraspinatus tendinitis, restless leg syndrome, spinal stenosis of lumbar region Neurologic TIA, insomnia [name], [name] ([id number]) [id number] Page 8 of 12 Objective Constitutional Elaine acute distress. Vitals Time Taken: 8:19 AM, Height: 63 in, Weight: 115 lbs, BMI: 20.4, Temperature: 98.3 F, Pulse: 99 bpm, Respiratory Rate: 18 breaths/min, Blood Pressure: 124/65 mmHg. Respiratory Normal work of breathing on room air. General Notes: 05/20/2023: The wounds measured a little bit smaller today. They have slough accumulation and the patient says everything is quite a bit more tender. There is more periwound erythema present today. Integumentary (Hair, Skin) Wound #4 status is Open. Original cause of wound was Trauma. The date acquired was: 11/27/2022. The wound has been in treatment 16 weeks. The wound is located on the Left,Anterior Lower Leg. The wound measures 7.5cm length x 4cm width x 0.1cm depth; 23.562cm^2 area and 2.356cm^3 volume. There is Fat Layer (Subcutaneous Tissue) exposed. There is Elaine tunneling or undermining noted. There is a medium amount of serosanguineous drainage noted. The wound margin is distinct with the outline attached to the wound base. There is medium (34-66%) red granulation within the wound bed. There is a medium (34-66%) amount of necrotic tissue within the wound bed including Adherent Slough. The periwound skin appearance had Elaine abnormalities noted for moisture. The periwound skin appearance exhibited: Scarring, Hemosiderin Staining. The periwound skin appearance did not exhibit: Callus, Crepitus, Excoriation, Induration, Rash, Atrophie Blanche, Cyanosis, Ecchymosis, Mottled, Pallor, Rubor, Erythema. Periwound temperature was noted as Elaine Abnormality. Wound #5 status is Open. Original cause of wound was Skin T ear/Laceration. The date acquired was: 04/02/2023. The wound has been in treatment 6  weeks. The wound is located on the Left,Proximal,Anterior Lower Leg. The wound measures 0.4cm length x 0.5cm width x 0.1cm depth; 0.157cm^2 area and 0.016cm^3 volume. There is Fat Layer (Subcutaneous Tissue) exposed. There is Elaine tunneling or undermining noted. There is a small amount of serosanguineous drainage noted. The wound margin is distinct with the outline attached to the wound base. There is large (67-100%) red, hyper - granulation within the wound bed. There is Elaine necrotic tissue within the wound bed. The periwound skin appearance had Elaine abnormalities noted for texture. The periwound skin appearance had Elaine abnormalities noted for moisture. The periwound skin appearance exhibited: Hemosiderin Staining. The periwound skin appearance did not exhibit: Ecchymosis, Rubor. Periwound temperature was noted as Elaine Abnormality. The periwound has tenderness on palpation. Assessment Active Problems ICD-10 Non-pressure chronic ulcer  of other part of left lower leg with fat layer exposed Pulmonary hypertension, unspecified Heart disease, unspecified Procedures Wound #4 Pre-procedure diagnosis of Wound #4 is a Venous Leg Ulcer located on the Left,Anterior Lower Leg .Severity of Tissue Pre Debridement is: Fat layer exposed. There was a Selective/Open Wound Non-Viable Tissue Debridement with a total area of 11.78 sq cm performed by Duanne Guess, MD. With the following instrument(s): Curette to remove Non-Viable tissue/material. Material removed includes [facility] after achieving pain control using Lidocaine 4% Topical Solution. Elaine specimens were taken. A time out was conducted at 08:45, prior to the start of the procedure. A Minimum amount of bleeding was controlled with Pressure. The procedure was tolerated well with a pain level of 2 throughout and a pain level of 1 following the procedure. Post Debridement Measurements: 7.5cm length x 4cm width x 0.1cm depth; 2.356cm^3 volume. Character of Wound/Ulcer  Post Debridement is improved. Severity of Tissue Post Debridement is: Fat layer exposed. Post procedure Diagnosis Wound #4: Same as Pre-Procedure General Notes: Scribed for Dr. Lady Gary by Zenaida Deed, RN. Plan Follow-up Appointments: Return Appointment in 1 week. - Dr. Lady Gary Room 1 Wed. 05/20/23 at 8:15am Anesthetic: Wound #4 Left,Anterior Lower Leg: (In clinic) Topical Lidocaine 4% applied to wound bed Wound #5 Left,Proximal,Anterior Lower Leg: (In clinic) Topical Lidocaine 4% applied to wound bed Bathing/ Shower/ Hygiene: May shower and wash wound with soap and water. - with dressing changes Edema Control - Lymphedema / SCD / Other: Elevate legs to the level of the heart or above for 30 minutes daily and/or when sitting for 3-4 times a day throughout the day. Avoid standing for long periods of time. Exercise regularly If compression wraps slide down please call wound center and speak with a nurse. The following medication(s) was prescribed: cephalexin oral 500 mg capsule 1 capsule p.o. 4 times daily x 7 days starting 05/20/2023 WOUND #4: - Lower Leg Wound Laterality: Left, Anterior Cleanser: Soap and Water 1 x Per Week/30 Days [name], [name] ([id number]) [phone].pdf Page 9 of 12 Discharge Instructions: May shower and wash wound with dial antibacterial soap and water prior to dressing change. Cleanser: Vashe 5.8 (oz) 1 x Per Week/30 Days Discharge Instructions: Cleanse the wound with Vashe prior to applying a clean dressing using gauze sponges, not tissue or cotton balls. Peri-Wound Care: Triamcinolone 15 (g) 1 x Per Week/30 Days Discharge Instructions: Use triamcinolone 15 (g) as directed Peri-Wound Care: Sween Lotion (Moisturizing lotion) 1 x Per Week/30 Days Discharge Instructions: Apply moisturizing lotion as directed Prim Dressing: MediHoney Gel, tube 1.5 (oz) 1 x Per Week/30 Days ary Discharge Instructions: Apply to wound bed as instructed Secondary  Dressing: T Non-adherent Dressing, 2x3 in 1 x Per Week/30 Days elfa Discharge Instructions: Apply over primary dressing as directed. Com pression Wrap: Kerlix Roll 4.5x3.1 (in/yd) 1 x Per Week/30 Days Discharge Instructions: Apply Kerlix and Coban compression as directed. Com pression Wrap: Coban Self-Adherent Wrap 4x5 (in/yd) 1 x Per Week/30 Days Discharge Instructions: Apply over Kerlix as directed. WOUND #5: - Lower Leg Wound Laterality: Left, Anterior, Proximal Cleanser: Soap and Water 1 x Per Week/30 Days Discharge Instructions: May shower and wash wound with dial antibacterial soap and water prior to dressing change. Cleanser: Vashe 5.8 (oz) 1 x Per Week/30 Days Discharge Instructions: Cleanse the wound with Vashe prior to applying a clean dressing using gauze sponges, not tissue or cotton balls. Peri-Wound Care: Triamcinolone 15 (g) 1 x Per Week/30 Days Discharge Instructions: Use triamcinolone 15 (g) as  directed Peri-Wound Care: Sween Lotion (Moisturizing lotion) 1 x Per Week/30 Days Discharge Instructions: Apply moisturizing lotion as directed Prim Dressing: MediHoney Gel, tube 1.5 (oz) 1 x Per Week/30 Days ary Discharge Instructions: Apply to wound bed as instructed Secondary Dressing: T Non-adherent Dressing, 2x3 in 1 x Per Week/30 Days elfa Discharge Instructions: Apply over primary dressing as directed. Com pression Wrap: Kerlix Roll 4.5x3.1 (in/yd) 1 x Per Week/30 Days Discharge Instructions: Apply Kerlix and Coban compression as directed. Com pression Wrap: Coban Self-Adherent Wrap 4x5 (in/yd) 1 x Per Week/30 Days Discharge Instructions: Apply over Kerlix as directed. 05/20/2023: The wounds measured a little bit smaller today. They have slough accumulation and the patient says everything is quite a bit more tender. There is more periwound erythema present today. I used a curette to debride slough from her wounds. The patient and her husband asked about using manuka honey or  Medihoney. I think it is worth a try. They will change the dressing at home every other day using Medihoney or manuka, covering with T and wrapping her legs with Kerlix and Coban. Due to elfa the increased tenderness and erythema, I have prescribed a 7-day course of Keflex, as she has responded well to this antibiotic in the past. Follow-up in 1 week. Electronic Signature(s) Signed: 05/20/2023 9:05:59 AM By: Duanne Guess MD FACS Entered By: Duanne Guess on 05/20/2023 09:05:59 -------------------------------------------------------------------------------- HxROS Details Patient Name: Date of Service: [name], Elaine RMA Long. 05/20/2023 8:15 A M Medical Record Number: [id number] Patient Account Number: [id number] Date of Birth/Sex: Treating RN: 12-12-1945 (77 y.o. F) Primary Care Provider: Dina Rich Other Clinician: Referring Provider: Treating Provider/Extender: Pincus Badder in Treatment: 16 Information Obtained From Patient Caregiver Eyes Medical History: Positive for: Cataracts - bil removed Negative for: Glaucoma; Optic Neuritis Ear/Nose/Mouth/Throat Medical History: Negative for: Chronic sinus problems/congestion; Middle ear problems Hematologic/Lymphatic [name], [name] ([id number]) [id number] Page 10 of 12 Medical History: Positive for: Anemia Past Medical History Notes: varicose vein of leg, mixed hyperlipidemia Respiratory Medical History: Positive for: Chronic Obstructive Pulmonary Disease (COPD) Past Medical History Notes: dyspnea, chronic bronchitis, pulmonary hypertension, pulmonary emphysema Cardiovascular Medical History: Positive for: Coronary Artery Disease; Peripheral Venous Disease - varicose veins Past Medical History Notes: aortic calcification, tachycardia, left ventricular dysfunction, bradycardia Gastrointestinal Medical History: Past Medical History Notes: Gerd Endocrine Medical  History: Negative for: Type I Diabetes; Type II Diabetes Past Medical History Notes: prediabetes, hypothyroidism Genitourinary Medical History: Negative for: End Stage Renal Disease Past Medical History Notes: bladder disorder Immunological Medical History: Positive for: Raynauds Negative for: Lupus Erythematosus; Scleroderma Integumentary (Skin) Medical History: Negative for: History of Burn Past Medical History Notes: contact dermatitis Musculoskeletal Medical History: Positive for: Rheumatoid Arthritis; Osteoarthritis Negative for: Gout Past Medical History Notes: acquired plantar porokeratosis, neurogenic claudication d/t lumbar spinal stenosis, supraspinatus tendinitis, restless leg syndrome, spinal stenosis of lumbar region Neurologic Medical History: Past Medical History Notes: TIA, insomnia Oncologic Medical History: Negative for: Received Chemotherapy; Received Radiation Psychiatric Medical History: Negative for: Anorexia/bulimia; Confinement Anxiety HBO Extended History Items Eyes: Cataracts [name], REAS ([id number]) [phone].pdf Page 11 of 12 Immunizations Pneumococcal Vaccine: Received Pneumococcal Vaccination: Yes Received Pneumococcal Vaccination On or After 60th Birthday: Yes Implantable Devices None Hospitalization / Surgery History Type of Hospitalization/Surgery bil cataract removal left shoiulder replacement right rotator cuff repair multiple lumbar spine surgeries melanoma removed left arm and chest partial excision bone left 2nd toe Family and Social History Unknown History: Yes; Former smoker; Marital Status - Married; Alcohol Use: Never;  Drug Use: Elaine History; Caffeine Use: Rarely; Financial Concerns: Elaine; Food, Clothing or Shelter Needs: Elaine; Support System Lacking: Elaine; Transportation Concerns: Elaine Electronic Signature(s) Signed: 05/20/2023 9:34:18 AM By: Duanne Guess MD FACS Entered By: Duanne Guess on  05/20/2023 09:03:00 -------------------------------------------------------------------------------- SuperBill Details Patient Name: Date of Service: Elaine Hurst RMA Long. 05/20/2023 Medical Record Number: [id number] Patient Account Number: [id number] Date of Birth/Sex: Treating RN: 09-10-1946 (77 y.o. F) Primary Care Provider: Dina Rich Other Clinician: Referring Provider: Treating Provider/Extender: Pincus Badder in Treatment: 16 Diagnosis Coding ICD-10 Codes Code Description [phone] Non-pressure chronic ulcer of other part of left lower leg with fat layer exposed I27.20 Pulmonary hypertension, unspecified I51.9 Heart disease, unspecified Facility Procedures : CPT4 Code: [id number] Description: 97597 - DEBRIDE WOUND 1ST 20 SQ CM OR < ICD-10 Diagnosis Description L97.822 Non-pressure chronic ulcer of other part of left lower leg with fat layer expose Modifier: d Quantity: 1 Physician Procedures : CPT4 Code Description Modifier [id number] 99214 - WC PHYS LEVEL 4 - EST PT 25 ICD-10 Diagnosis Description L97.822 Non-pressure chronic ulcer of other part of left lower leg with fat layer exposed I27.20 Pulmonary hypertension, unspecified I51.9 Heart  disease, unspecified Quantity: 1 : [id number] 97597 - WC PHYS DEBR WO ANESTH 20 SQ CM ICD-10 Diagnosis Description L97.822 Non-pressure chronic ulcer of other part of left lower leg with fat layer exposed ALANYA, VUKELICH Long ([id number]) [id number] Quantity: 1 df Page 12 of 12 Electronic Signature(s) Signed: 05/20/2023 9:06:13 AM By: Duanne Guess MD FACS Entered By: Duanne Guess on 05/20/2023 09:06:13

## 2023-05-20 NOTE — Progress Notes (Signed)
Elaine, Long ([id number]) [id number] Page 1 of 8 Visit Report for 05/20/2023 Arrival Information Details Patient Name: Date of Service: [name], [name] [name] 05/20/2023 8:15 A M Medical Record Number: [id number] Patient Account Number: [id number] Date of Birth/Sex: Treating RN: 01-09-1946 (77 y.o. [name], [name] Primary Care Blaze Nylund: Dina Rich Other Clinician: Referring Mariame Rybolt: Treating Abigale Dorow/Extender: Pincus Badder in Treatment: 16 Visit Information History Since Last Visit Added or deleted any medications: No Patient Arrived: [name] Any new allergies or adverse reactions: No Arrival Time: 08:15 Had a fall or experienced change in No Accompanied By: spouse activities of daily living that may affect Transfer Assistance: None risk of falls: Patient Requires Transmission-Based Precautions: No Signs or symptoms of abuse/neglect since last visito No Patient Has Alerts: Yes Hospitalized since last visit: No Patient Alerts: ABI L 1.16 02/19/22 Implantable device outside of the clinic excluding No cellular tissue based products placed in the center since last visit: Has Dressing in Place as Prescribed: Yes Has Compression in Place as Prescribed: Yes Pain Present Now: Yes Electronic Signature(s) Signed: 05/20/2023 5:09:00 PM By: Zenaida Deed RN, BSN Entered By: Zenaida Deed on 05/20/2023 08:18:17 -------------------------------------------------------------------------------- Lower Extremity Assessment Details Patient Name: Date of Service: [name] [name] 05/20/2023 8:15 A M Medical Record Number: [id number] Patient Account Number: [id number] Date of Birth/Sex: Treating RN: July 17, 1946 (77 y.o. [name] Primary Care Montay Vanvoorhis: Dina Rich Other Clinician: Referring Lorenso Quirino: Treating Lavine Hargrove/Extender: Pincus Badder in Treatment: 16 Edema Assessment Assessed: [name]: No] [name]: No] Edema:  [Left: N] [Right: o] Calf Left: Right: Point of Measurement: 25 cm From Medial Instep 30 cm Ankle Left: Right: Point of Measurement: 10 cm From Medial Instep 18.5 cm Vascular Assessment Pulses: Dorsalis Pedis Palpable: [Left:No] Paulhus, Nykole K ([id number]) [Right:[id number] Page 2 of 8] Extremity colors, hair growth, and conditions: Extremity Color: [Left:Hyperpigmented] Hair Growth on Extremity: [Left:No] Temperature of Extremity: [Left:Warm < 3 seconds] Electronic Signature(s) Signed: 05/20/2023 5:09:00 PM By: Zenaida Deed RN, BSN Entered By: Zenaida Deed on 05/20/2023 08:25:16 -------------------------------------------------------------------------------- Multi Wound Chart Details Patient Name: Date of Service: [name] [name] 05/20/2023 8:15 A M Medical Record Number: [id number] Patient Account Number: [id number] Date of Birth/Sex: Treating RN: 06-Jul-1946 (77 y.o. F) Primary Care Evie Croston: Dina Rich Other Clinician: Referring Luisa Louk: Treating Landy Dunnavant/Extender: Pincus Badder in Treatment: 16 Vital Signs Height(in): 63 Pulse(bpm): 99 Weight(lbs): 115 Blood Pressure(mmHg): 124/65 Body Mass Index(BMI): 20.4 Temperature(F): 98.3 Respiratory Rate(breaths/min): 18 [4:Photos:] [N/A:N/A] Left, Anterior Lower Leg Left, Proximal, Anterior Lower Leg N/A Wound Location: Trauma Skin Tear/Laceration N/A Wounding Event: Venous Leg Ulcer Skin Tear N/A Primary Etiology: Cataracts, Anemia, Chronic Cataracts, Anemia, Chronic N/A Comorbid History: Obstructive Pulmonary Disease Obstructive Pulmonary Disease (COPD), Coronary Artery Disease, (COPD), Coronary Artery Disease, Peripheral Venous Disease, Peripheral Venous Disease, Raynauds, Rheumatoid Arthritis, Raynauds, Rheumatoid Arthritis, Osteoarthritis Osteoarthritis 11/27/2022 04/02/2023 N/A Date Acquired: 16 6 N/A Weeks of Treatment: Open Open N/A Wound Status: No No  N/A Wound Recurrence: Yes No N/A Clustered Wound: 4 N/A N/A Clustered Quantity: 7.5x4x0.1 0.4x0.5x0.1 N/A Measurements L x W x D (cm) 23.562 0.157 N/A A (cm) : rea 2.356 0.016 N/A Volume (cm) : -170.30% 99.40% N/A % Reduction in Area: -170.20% 99.40% N/A % Reduction in Volume: Full Thickness Without Exposed Full Thickness Without Exposed N/A Classification: Support Structures Support Structures Medium Small N/A Exudate Amount: Serosanguineous Serosanguineous N/A Exudate Type: red, brown red, brown N/A Exudate Color: Distinct, outline attached Distinct, outline attached N/A Wound Margin: Medium (34-66%)  Large (67-100%) N/A Granulation Amount: Red Red, Hyper-granulation N/A Granulation Quality: Medium (34-66%) None Present (0%) N/A Necrotic Amount: Fat Layer (Subcutaneous Tissue): Yes Fat Layer (Subcutaneous Tissue): Yes N/A Exposed Structures: Fascia: No Fascia: No Tendon: No Tendon: No Muscle: No Muscle: No Breau, Curtisha K ([id number]) [id number] Page 3 of 8 Joint: No Joint: No Bone: No Bone: No Medium (34-66%) Medium (34-66%) N/A Epithelialization: Debridement - Selective/Open Wound N/A N/A Debridement: Pre-procedure Verification/Time Out 08:45 N/A N/A Taken: Lidocaine 4% Topical Solution N/A N/A Pain Control: Slough N/A N/A Tissue Debrided: Non-Viable Tissue N/A N/A Level: 11.78 N/A N/A Debridement A (sq cm): rea Curette N/A N/A Instrument: Minimum N/A N/A Bleeding: Pressure N/A N/A Hemostasis A chieved: 2 N/A N/A Procedural Pain: 1 N/A N/A Post Procedural Pain: Procedure was tolerated well N/A N/A Debridement Treatment Response: 7.5x4x0.1 N/A N/A Post Debridement Measurements L x W x D (cm) 2.356 N/A N/A Post Debridement Volume: (cm) Scarring: Yes No Abnormalities Noted N/A Periwound Skin Texture: Excoriation: No Induration: No Callus: No Crepitus: No Rash: No Maceration: Yes No Abnormalities Noted  N/A Periwound Skin Moisture: Dry/Scaly: No Hemosiderin Staining: Yes Hemosiderin Staining: Yes N/A Periwound Skin Color: Atrophie Blanche: No Ecchymosis: No Cyanosis: No Rubor: No Ecchymosis: No Erythema: No Mottled: No Pallor: No Rubor: No No Abnormality No Abnormality N/A Temperature: N/A Yes N/A Tenderness on Palpation: Debridement N/A N/A Procedures Performed: Treatment Notes Electronic Signature(s) Signed: 05/20/2023 9:01:45 AM By: Duanne Guess MD FACS Entered By: Duanne Guess on 05/20/2023 09:01:45 -------------------------------------------------------------------------------- Multi-Disciplinary Care Plan Details Patient Name: Date of Service: [name] [name] 05/20/2023 8:15 A M Medical Record Number: [id number] Patient Account Number: [id number] Date of Birth/Sex: Treating RN: 12-Jan-1946 (77 y.o. [name] Primary Care Dayson Aboud: Dina Rich Other Clinician: Referring Sayvion Vigen: Treating Zeyad Delaguila/Extender: Pincus Badder in Treatment: 16 Active Inactive Nutrition Nursing Diagnoses: Potential for alteratiion in Nutrition/Potential for imbalanced nutrition Goals: Patient/caregiver agrees to and verbalizes understanding of need to obtain nutritional consultation Date Initiated: 01/26/2023 Target Resolution Date: 05/29/2023 Goal Status: Active Patient/caregiver will maintain therapeutic glucose control Date Initiated: 01/26/2023 Target Resolution Date: 05/29/2023 Goal Status: Active Interventions: JRUE, YAMBAO ([id number]) [phone].pdf Page 4 of 8 Assess patient nutrition upon admission and as needed per policy Provide education on nutrition Treatment Activities: Giving encouragement to exercise : 01/26/2023 Notes: Pain, Acute or Chronic Nursing Diagnoses: Pain, acute or chronic: actual or potential Potential alteration in comfort, pain Goals: Patient will verbalize adequate pain control and receive  pain control interventions during procedures as needed Date Initiated: 01/26/2023 Target Resolution Date: 05/29/2023 Goal Status: Active Patient/caregiver will verbalize comfort level met Date Initiated: 01/26/2023 Target Resolution Date: 05/29/2023 Goal Status: Active Interventions: Complete pain assessment as per visit requirements Encourage patient to take pain medications as prescribed Provide education on pain management Treatment Activities: Administer pain control measures as ordered : 01/26/2023 Notes: Electronic Signature(s) Signed: 05/20/2023 5:09:00 PM By: Zenaida Deed RN, BSN Entered By: Zenaida Deed on 05/20/2023 08:34:24 -------------------------------------------------------------------------------- Pain Assessment Details Patient Name: Date of Service: [name] [name] 05/20/2023 8:15 A M Medical Record Number: [id number] Patient Account Number: [id number] Date of Birth/Sex: Treating RN: [birth year] (77 y.o. [name] Primary Care Alysson Geist: Dina Rich Other Clinician: Referring Ashanta Amoroso: Treating Ltanya Bayley/Extender: Pincus Badder in Treatment: 16 Active Problems Location of Pain Severity and Description of Pain Patient Has Paino Yes Site Locations Pain Location: Pain in Ulcers With Dressing Change: Yes Duration of the Pain. Constant / Intermittento Intermittent Rate the pain.  Current Pain Level: 4 Worst Pain Level: 5 Least Pain Level: 0 Galentine, Laquilla K ([id number]) [phone].pdf Page 5 of 8 Character of Pain Describe the Pain: Burning Pain Management and Medication Current Pain Management: Medication: Yes Other: tolerable Is the Current Pain Management Adequate: Adequate How does your wound impact your activities of daily livingo Sleep: No Bathing: No Appetite: No Relationship With Others: No Bladder Continence: No Emotions: No Bowel Continence: No Work: No Toileting: No Drive: No Dressing:  No Hobbies: No Psychologist, prison and probation services) Signed: 05/20/2023 5:09:00 PM By: Zenaida Deed RN, BSN Entered By: Zenaida Deed on 05/20/2023 08:20:45 -------------------------------------------------------------------------------- Patient/Caregiver Education Details Patient Name: Date of Service: [name] [name] 7/24/2024andnbsp8:15 A M Medical Record Number: [id number] Patient Account Number: [id number] Date of Birth/Gender: Treating RN: 1946/10/01 (77 y.o. [name] Primary Care Physician: Dina Rich Other Clinician: Referring Physician: Treating Physician/Extender: Pincus Badder in Treatment: 16 Education Assessment Education Provided To: Patient Education Topics Provided Venous: Methods: Explain/Verbal Responses: Reinforcements needed, State content correctly Wound/Skin Impairment: Methods: Explain/Verbal Responses: Reinforcements needed, State content correctly Electronic Signature(s) Signed: 05/20/2023 5:09:00 PM By: Zenaida Deed RN, BSN Entered By: Zenaida Deed on 05/20/2023 08:35:37 -------------------------------------------------------------------------------- Wound Assessment Details Patient Name: Date of Service: [name] [name] 05/20/2023 8:15 A M Medical Record Number: [id number] Patient Account Number: [id number] Date of Birth/Sex: Treating RN: [birth year] (77 y.o. [name] Primary Care Shaye Elling: Dina Rich Other Clinician: Referring Aria Jarrard: Treating Lacy Taglieri/Extender: Ethelene Hal Rossiter, [name] ([id number]) [id number] Page 6 of 8 Weeks in Treatment: 16 Wound Status Wound Number: 4 Primary Venous Leg Ulcer Etiology: Wound Location: Left, Anterior Lower Leg Wound Open Wounding Event: Trauma Status: Date Acquired: 11/27/2022 Comorbid Cataracts, Anemia, Chronic Obstructive Pulmonary Disease Weeks Of Treatment: 16 History: (COPD), Coronary Artery Disease, Peripheral  Venous Disease, Clustered Wound: Yes Raynauds, Rheumatoid Arthritis, Osteoarthritis Photos Wound Measurements Length: (cm) Width: (cm) Depth: (cm) Clustered Quantity: Area: (cm) Volume: (cm) 7.5 % Reduction in Area: -170.3% 4 % Reduction in Volume: -170.2% 0.1 Epithelialization: Medium (34-66%) 4 Tunneling: No 23.562 Undermining: No 2.356 Wound Description Classification: Full Thickness Without Exposed Sup Wound Margin: Distinct, outline attached Exudate Amount: Medium Exudate Type: Serosanguineous Exudate Color: red, brown port Structures Foul Odor After Cleansing: No Slough/Fibrino Yes Wound Bed Granulation Amount: Medium (34-66%) Exposed Structure Granulation Quality: Red Fascia Exposed: No Necrotic Amount: Medium (34-66%) Fat Layer (Subcutaneous Tissue) Exposed: Yes Necrotic Quality: Adherent Slough Tendon Exposed: No Muscle Exposed: No Joint Exposed: No Bone Exposed: No Periwound Skin Texture Texture Color No Abnormalities Noted: No No Abnormalities Noted: No Callus: No Atrophie Blanche: No Crepitus: No Cyanosis: No Excoriation: No Ecchymosis: No Induration: No Erythema: No Rash: No Hemosiderin Staining: Yes Scarring: Yes Mottled: No Pallor: No Moisture Rubor: No No Abnormalities Noted: Yes Temperature / Pain Temperature: No Abnormality Electronic Signature(s) Signed: 05/20/2023 5:09:00 PM By: Zenaida Deed RN, BSN Entered By: Zenaida Deed on 05/20/2023 08:32:42 [name] ([id number]) [id number] Page 7 of 8 -------------------------------------------------------------------------------- Wound Assessment Details Patient Name: Date of Service: MACK, THURMON [name] 05/20/2023 8:15 A M Medical Record Number: [id number] Patient Account Number: [id number] Date of Birth/Sex: Treating RN: 27-Dec-1945 (77 y.o. [name] Primary Care Aryelle Figg: Dina Rich Other Clinician: Referring Raife Lizer: Treating  Rosaria Kubin/Extender: Pincus Badder in Treatment: 16 Wound Status Wound Number: 5 Primary Skin Tear Etiology: Wound Location: Left, Proximal, Anterior Lower Leg Wound Open Wounding Event: Skin Tear/Laceration Status: Date Acquired: 04/02/2023 Comorbid Cataracts, Anemia, Chronic Obstructive Pulmonary Disease Weeks  Of Treatment: 6 History: (COPD), Coronary Artery Disease, Peripheral Venous Disease, Clustered Wound: No Raynauds, Rheumatoid Arthritis, Osteoarthritis Photos Wound Measurements Length: (cm) 0 Width: (cm) 0 Depth: (cm) 0 Area: (cm) Volume: (cm) .4 % Reduction in Area: 99.4% .5 % Reduction in Volume: 99.4% .1 Epithelialization: Medium (34-66%) 0.157 Tunneling: No 0.016 Undermining: No Wound Description Classification: Full Thickness Without Exposed Suppor Wound Margin: Distinct, outline attached Exudate Amount: Small Exudate Type: Serosanguineous Exudate Color: red, brown t Structures Foul Odor After Cleansing: No Slough/Fibrino No Wound Bed Granulation Amount: Large (67-100%) Exposed Structure Granulation Quality: Red, Hyper-granulation Fascia Exposed: No Necrotic Amount: None Present (0%) Fat Layer (Subcutaneous Tissue) Exposed: Yes Tendon Exposed: No Muscle Exposed: No Joint Exposed: No Bone Exposed: No Periwound Skin Texture Texture Color No Abnormalities Noted: Yes No Abnormalities Noted: No Ecchymosis: No Moisture Hemosiderin Staining: Yes No Abnormalities Noted: Yes Rubor: No Temperature / Pain Temperature: No Abnormality Tenderness on Palpation: Yes Electronic Signature(s) Signed: 05/20/2023 5:09:00 PM By: Zenaida Deed RN, BSN Cashion, Josephyne K ([id number]) [id number] Page 8 of 8 Entered By: Zenaida Deed on 05/20/2023 08:33:53 -------------------------------------------------------------------------------- Vitals Details Patient Name: Date of Service: JONISE, WEIGHTMAN [name] 05/20/2023 8:15 A  M Medical Record Number: [id number] Patient Account Number: [id number] Date of Birth/Sex: Treating RN: 04-29-1946 (77 y.o. [name] Primary Care Shamir Tuzzolino: Dina Rich Other Clinician: Referring Ludwig Tugwell: Treating Sathvika Ojo/Extender: Pincus Badder in Treatment: 16 Vital Signs Time Taken: 08:19 Temperature (F): 98.3 Height (in): 63 Pulse (bpm): 99 Weight (lbs): 115 Respiratory Rate (breaths/min): 18 Body Mass Index (BMI): 20.4 Blood Pressure (mmHg): 124/65 Reference Range: 80 - 120 mg / dl Electronic Signature(s) Signed: 05/20/2023 5:09:00 PM By: Zenaida Deed RN, BSN Entered By: Zenaida Deed on 05/20/2023 08:20:01

## 2023-05-29 ENCOUNTER — Encounter (HOSPITAL_BASED_OUTPATIENT_CLINIC_OR_DEPARTMENT_OTHER): Payer: PPO | Attending: General Surgery | Admitting: General Surgery

## 2023-05-29 DIAGNOSIS — L97122 Non-pressure chronic ulcer of left thigh with fat layer exposed: Secondary | ICD-10-CM | POA: Diagnosis not present

## 2023-05-29 DIAGNOSIS — I7300 Raynaud's syndrome without gangrene: Secondary | ICD-10-CM | POA: Insufficient documentation

## 2023-05-29 DIAGNOSIS — I2510 Atherosclerotic heart disease of native coronary artery without angina pectoris: Secondary | ICD-10-CM | POA: Diagnosis not present

## 2023-05-29 DIAGNOSIS — J449 Chronic obstructive pulmonary disease, unspecified: Secondary | ICD-10-CM | POA: Diagnosis not present

## 2023-05-29 DIAGNOSIS — Z87891 Personal history of nicotine dependence: Secondary | ICD-10-CM | POA: Diagnosis not present

## 2023-05-29 DIAGNOSIS — L97822 Non-pressure chronic ulcer of other part of left lower leg with fat layer exposed: Secondary | ICD-10-CM | POA: Insufficient documentation

## 2023-05-29 DIAGNOSIS — E782 Mixed hyperlipidemia: Secondary | ICD-10-CM | POA: Insufficient documentation

## 2023-05-29 DIAGNOSIS — I272 Pulmonary hypertension, unspecified: Secondary | ICD-10-CM | POA: Diagnosis not present

## 2023-05-29 DIAGNOSIS — M069 Rheumatoid arthritis, unspecified: Secondary | ICD-10-CM | POA: Diagnosis not present

## 2023-05-29 NOTE — Progress Notes (Signed)
[name], [name] ([id number]) [id number] Page 1 of 9 Visit Report for 05/29/2023 Arrival Information Details Patient Name: Date of Service: [name] RMA Long. 05/29/2023 9:45 A M Medical Record Number: [id number] Patient Account Number: [id number] Date of Birth/Sex: Treating RN: 1945/12/29 (77 y.o. [name] Primary Care Alder Murri: Dina Rich Other Clinician: Referring Lynden Flemmer: Treating Onyx Schirmer/Extender: Pincus Badder in Treatment: 17 Visit Information History Since Last Visit Added or deleted any medications: No Patient Arrived: [name] Any new allergies or adverse reactions: No Arrival Time: 09:38 Had a fall or experienced change in No Accompanied By: husband activities of daily living that may affect Transfer Assistance: None risk of falls: Patient Identification Verified: Yes Signs or symptoms of abuse/neglect since last visito No Secondary Verification Process Completed: Yes Hospitalized since last visit: No Patient Requires Transmission-Based Precautions: No Implantable device outside of the clinic excluding No Patient Has Alerts: Yes cellular tissue based products placed in the center Patient Alerts: ABI L 1.16 02/19/22 since last visit: Has Dressing in Place as Prescribed: Yes Has Compression in Place as Prescribed: Yes Pain Present Now: No Electronic Signature(s) Signed: 05/29/2023 11:32:35 AM By: Samuella Bruin Entered By: Samuella Bruin on 05/29/2023 09:38:46 -------------------------------------------------------------------------------- Encounter Discharge Information Details Patient Name: Date of Service: [name] RMA Long. 05/29/2023 9:45 A M Medical Record Number: [id number] Patient Account Number: [id number] Date of Birth/Sex: Treating RN: [birth year] (77 y.o. [name] Primary Care Raif Chachere: Dina Rich Other Clinician: Referring Lydia Meng: Treating Antwuan Eckley/Extender: Pincus Badder in Treatment: 17 Encounter Discharge Information Items Post Procedure Vitals Discharge Condition: Stable Temperature (F): 98.6 Ambulatory Status: Cane Pulse (bpm): 116 Discharge Destination: Home Respiratory Rate (breaths/min): 18 Transportation: Private Auto Blood Pressure (mmHg): 145/67 Accompanied By: husband Schedule Follow-up Appointment: Yes Clinical Summary of Care: Patient Declined Electronic Signature(s) Signed: 05/29/2023 11:32:35 AM By: Samuella Bruin Entered By: Samuella Bruin on 05/29/2023 10:07:53 [name], [name] ([id number]) [id number] Page 2 of 9 -------------------------------------------------------------------------------- Lower Extremity Assessment Details Patient Name: Date of Service: [name], [name] RMA Long. 05/29/2023 9:45 A M Medical Record Number: [id number] Patient Account Number: [id number] Date of Birth/Sex: Treating RN: [birth year] (77 y.o. [name] Primary Care Ascher Schroepfer: Dina Rich Other Clinician: Referring Sausha Raymond: Treating Cindee Mclester/Extender: Pincus Badder in Treatment: 17 Edema Assessment Assessed: [name]: No] [name]: No] Edema: [Left: N] [Right: o] Calf Left: Right: Point of Measurement: 25 cm From Medial Instep 29.6 cm Ankle Left: Right: Point of Measurement: 10 cm From Medial Instep 18.5 cm Vascular Assessment Pulses: Dorsalis Pedis Palpable: [Left:Yes] Extremity colors, hair growth, and conditions: Extremity Color: [Left:Hyperpigmented] Hair Growth on Extremity: [Left:No] Temperature of Extremity: [Left:Warm < 3 seconds] Electronic Signature(s) Signed: 05/29/2023 11:32:35 AM By: Samuella Bruin Entered By: Samuella Bruin on 05/29/2023 09:44:10 -------------------------------------------------------------------------------- Multi Wound Chart Details Patient Name: Date of Service: [name] RMA Long. 05/29/2023 9:45 A M Medical Record Number: [id number] Patient  Account Number: [id number] Date of Birth/Sex: Treating RN: 1946-08-18 (77 y.o. F) Primary Care Zaul Hubers: Dina Rich Other Clinician: Referring Tiphanie Vo: Treating Kaydence Baba/Extender: Pincus Badder in Treatment: 17 Vital Signs Height(in): 63 Pulse(bpm): 116 Weight(lbs): 115 Blood Pressure(mmHg): 145/67 Body Mass Index(BMI): 20.4 Temperature(F): 98.6 Respiratory Rate(breaths/min): 18 [4:Photos:] [N/A:N/A [id number] Page 3 of 9] Left, Anterior Lower Leg Left, Proximal, Anterior Lower Leg N/A Wound Location: Trauma Skin T ear/Laceration N/A Wounding Event: Venous Leg Ulcer Skin T ear N/A Primary Etiology: Cataracts, Anemia, Chronic Cataracts, Anemia, Chronic N/A Comorbid History: Obstructive Pulmonary Disease Obstructive Pulmonary Disease (COPD), Coronary  Artery Disease, (COPD), Coronary Artery Disease, Peripheral Venous Disease, Peripheral Venous Disease, Raynauds, Rheumatoid Arthritis, Raynauds, Rheumatoid Arthritis, Osteoarthritis Osteoarthritis 11/27/2022 04/02/2023 N/A Date Acquired: 17 7 N/A Weeks of Treatment: Open Open N/A Wound Status: No No N/A Wound Recurrence: Yes No N/A Clustered Wound: 4 N/A N/A Clustered Quantity: 8.3x5.8x0.1 0x0x0 N/A Measurements L x W x D (cm) 37.809 0 N/A A (cm) : rea 3.781 0 N/A Volume (cm) : -333.70% 100.00% N/A % Reduction in A rea: -333.60% 100.00% N/A % Reduction in Volume: Full Thickness Without Exposed Full Thickness Without Exposed N/A Classification: Support Structures Support Structures Medium None Present N/A Exudate A mount: Serosanguineous N/A N/A Exudate Type: red, brown N/A N/A Exudate Color: Distinct, outline attached Distinct, outline attached N/A Wound Margin: Small (1-33%) None Present (0%) N/A Granulation A mount: Red N/A N/A Granulation Quality: Large (67-100%) None Present (0%) N/A Necrotic A mount: Fat Layer (Subcutaneous Tissue): Yes Fascia: No  N/A Exposed Structures: Fascia: No Fat Layer (Subcutaneous Tissue): No Tendon: No Tendon: No Muscle: No Muscle: No Joint: No Joint: No Bone: No Bone: No Medium (34-66%) Large (67-100%) N/A Epithelialization: Debridement - Selective/Open Wound N/A N/A Debridement: Pre-procedure Verification/Time Out 09:57 N/A N/A Taken: Lidocaine 4% Topical Solution N/A N/A Pain Control: Slough N/A N/A Tissue Debrided: Non-Viable Tissue N/A N/A Level: 11.34 N/A N/A Debridement A (sq cm): rea Curette N/A N/A Instrument: Minimum N/A N/A Bleeding: Pressure N/A N/A Hemostasis A chieved: Procedure was tolerated well N/A N/A Debridement Treatment Response: 8.3x5.8x0.1 N/A N/A Post Debridement Measurements L x W x D (cm) 3.781 N/A N/A Post Debridement Volume: (cm) Scarring: Yes No Abnormalities Noted N/A Periwound Skin Texture: Excoriation: No Induration: No Callus: No Crepitus: No Rash: No Maceration: Yes No Abnormalities Noted N/A Periwound Skin Moisture: Dry/Scaly: No Hemosiderin Staining: Yes Hemosiderin Staining: Yes N/A Periwound Skin Color: Atrophie Blanche: No Ecchymosis: No Cyanosis: No Rubor: No Ecchymosis: No Erythema: No Mottled: No Pallor: No Rubor: No No Abnormality No Abnormality N/A Temperature: Debridement N/A N/A Procedures Performed: Treatment Notes Wound #4 (Lower Leg) Wound Laterality: Left, Anterior Cleanser Soap and Water Discharge Instruction: May shower and wash wound with dial antibacterial soap and water prior to dressing change. Vashe 5.8 (oz) Discharge Instruction: Cleanse the wound with Vashe prior to applying a clean dressing using gauze sponges, not tissue or cotton balls. [name], [name] ([id number]) [id number] Page 4 of 9 Peri-Wound Care Triamcinolone 15 (g) Discharge Instruction: Use triamcinolone 15 (g) as directed Sween Lotion (Moisturizing lotion) Discharge Instruction: Apply moisturizing lotion as  directed Topical Primary Dressing MediHoney Gel, tube 1.5 (oz) Discharge Instruction: Apply to wound bed as instructed Secondary Dressing T Non-adherent Dressing, 2x3 in elfa Discharge Instruction: Apply over primary dressing as directed. Secured With Compression Wrap Kerlix Roll 4.5x3.1 (in/yd) Discharge Instruction: Apply Kerlix and Coban compression as directed. Coban Self-Adherent Wrap 4x5 (in/yd) Discharge Instruction: Apply over Kerlix as directed. Compression Stockings Add-Ons Wound #5 (Lower Leg) Wound Laterality: Left, Anterior, Proximal Cleanser Peri-Wound Care Topical Primary Dressing Secondary Dressing Secured With Compression Wrap Compression Stockings Add-Ons Electronic Signature(s) Signed: 05/29/2023 10:13:23 AM By: Duanne Guess MD FACS Entered By: Duanne Guess on 05/29/2023 10:13:22 -------------------------------------------------------------------------------- Multi-Disciplinary Care Plan Details Patient Name: Date of Service: [name] RMA Long. 05/29/2023 9:45 A M Medical Record Number: [id number] Patient Account Number: [id number] Date of Birth/Sex: Treating RN: [birth year] (77 y.o. [name] Primary Care Arilla Hice: Dina Rich Other Clinician: Referring Gretel Cantu: Treating Kashana Breach/Extender: Pincus Badder in Treatment: 17 Active Inactive Nutrition Nursing  DiagnosesSHALAYNE, [name] ([id number]) [id number] Page 5 of 9 Potential for alteratiion in Nutrition/Potential for imbalanced nutrition Goals: Patient/caregiver agrees to and verbalizes understanding of need to obtain nutritional consultation Date Initiated: 01/26/2023 Target Resolution Date: 07/24/2023 Goal Status: Active Patient/caregiver will maintain therapeutic glucose control Date Initiated: 01/26/2023 Target Resolution Date: 07/24/2023 Goal Status: Active Interventions: Assess patient nutrition upon admission and as needed per  policy Provide education on nutrition Treatment Activities: Giving encouragement to exercise : 01/26/2023 Notes: Pain, Acute or Chronic Nursing Diagnoses: Pain, acute or chronic: actual or potential Potential alteration in comfort, pain Goals: Patient will verbalize adequate pain control and receive pain control interventions during procedures as needed Date Initiated: 01/26/2023 Date Inactivated: 05/29/2023 Target Resolution Date: 05/29/2023 Goal Status: Met Patient/caregiver will verbalize comfort level met Date Initiated: 01/26/2023 Target Resolution Date: 06/26/2023 Goal Status: Active Interventions: Complete pain assessment as per visit requirements Encourage patient to take pain medications as prescribed Provide education on pain management Treatment Activities: Administer pain control measures as ordered : 01/26/2023 Notes: Electronic Signature(s) Signed: 05/29/2023 11:32:35 AM By: Samuella Bruin Entered By: Samuella Bruin on 05/29/2023 09:50:37 -------------------------------------------------------------------------------- Pain Assessment Details Patient Name: Date of Service: [name] RMA Long. 05/29/2023 9:45 A M Medical Record Number: [id number] Patient Account Number: [id number] Date of Birth/Sex: Treating RN: [birth year] (77 y.o. [name] Primary Care Jaeven Wanzer: Dina Rich Other Clinician: Referring Dejanee Thibeaux: Treating Evelyn Aguinaldo/Extender: Pincus Badder in Treatment: 17 Active Problems Location of Pain Severity and Description of Pain Patient Has Paino No Site Locations Rate the pain. [name], [name] ([id number]) [id number] Page 6 of 9 Rate the pain. Current Pain Level: 0 Pain Management and Medication Current Pain Management: Electronic Signature(s) Signed: 05/29/2023 11:32:35 AM By: Samuella Bruin Entered By: Samuella Bruin on 05/29/2023  09:39:41 -------------------------------------------------------------------------------- Patient/Caregiver Education Details Patient Name: Date of Service: [name] RMA Long. 8/2/2024andnbsp9:45 A M Medical Record Number: [id number] Patient Account Number: [id number] Date of Birth/Gender: Treating RN: August 15, 1946 (77 y.o. [name] Primary Care Physician: Dina Rich Other Clinician: Referring Physician: Treating Physician/Extender: Pincus Badder in Treatment: 17 Education Assessment Education Provided To: Patient Education Topics Provided Wound/Skin Impairment: Methods: Explain/Verbal Responses: Reinforcements needed, State content correctly Electronic Signature(s) Signed: 05/29/2023 11:32:35 AM By: Samuella Bruin Entered By: Samuella Bruin on 05/29/2023 09:50:48 -------------------------------------------------------------------------------- Wound Assessment Details Patient Name: Date of Service: [name] RMA Long. 05/29/2023 9:45 A M Medical Record Number: [id number] Patient Account Number: [id number] Date of Birth/Sex: Treating RN: 12-Dec-1945 (77 y.o. [name] Primary Care Lecia Esperanza: Dina Rich Other Clinician: Referring Francee Setzer: Treating Lexa Coronado/Extender: Ethelene Hal Jackpot, [name] ([id number]) [id number] Page 7 of 9 Weeks in Treatment: 17 Wound Status Wound Number: 4 Primary Venous Leg Ulcer Etiology: Wound Location: Left, Anterior Lower Leg Wound Open Wounding Event: Trauma Status: Date Acquired: 11/27/2022 Comorbid Cataracts, Anemia, Chronic Obstructive Pulmonary Disease Weeks Of Treatment: 17 History: (COPD), Coronary Artery Disease, Peripheral Venous Disease, Clustered Wound: Yes Raynauds, Rheumatoid Arthritis, Osteoarthritis Photos Wound Measurements Length: (cm) Width: (cm) Depth: (cm) Clustered Quantity: Area: (cm) Volume: (cm) 8.3 % Reduction in Area:  -333.7% 5.8 % Reduction in Volume: -333.6% 0.1 Epithelialization: Medium (34-66%) 4 Tunneling: No 37.809 Undermining: No 3.781 Wound Description Classification: Full Thickness Without Exposed Sup Wound Margin: Distinct, outline attached Exudate Amount: Medium Exudate Type: Serosanguineous Exudate Color: red, brown port Structures Foul Odor After Cleansing: No Slough/Fibrino Yes Wound Bed Granulation Amount: Small (1-33%) Exposed Structure Granulation Quality: Red Fascia Exposed: No Necrotic Amount: Large (67-100%)  Fat Layer (Subcutaneous Tissue) Exposed: Yes Necrotic Quality: Adherent Slough Tendon Exposed: No Muscle Exposed: No Joint Exposed: No Bone Exposed: No Periwound Skin Texture Texture Color No Abnormalities Noted: No No Abnormalities Noted: No Callus: No Atrophie Blanche: No Crepitus: No Cyanosis: No Excoriation: No Ecchymosis: No Induration: No Erythema: No Rash: No Hemosiderin Staining: Yes Scarring: Yes Mottled: No Pallor: No Moisture Rubor: No No Abnormalities Noted: Yes Temperature / Pain Temperature: No Abnormality Treatment Notes Wound #4 (Lower Leg) Wound Laterality: Left, Anterior Cleanser Soap and Water Discharge Instruction: May shower and wash wound with dial antibacterial soap and water prior to dressing change. Vashe 5.8 (oz) Discharge Instruction: Cleanse the wound with Vashe prior to applying a clean dressing using gauze sponges, not tissue or cotton balls. [name], [name] ([id number]) [id number] Page 8 of 9 Peri-Wound Care Triamcinolone 15 (g) Discharge Instruction: Use triamcinolone 15 (g) as directed Sween Lotion (Moisturizing lotion) Discharge Instruction: Apply moisturizing lotion as directed Topical Primary Dressing MediHoney Gel, tube 1.5 (oz) Discharge Instruction: Apply to wound bed as instructed Secondary Dressing T Non-adherent Dressing, 2x3 in elfa Discharge Instruction: Apply over primary  dressing as directed. Secured With Compression Wrap Kerlix Roll 4.5x3.1 (in/yd) Discharge Instruction: Apply Kerlix and Coban compression as directed. Coban Self-Adherent Wrap 4x5 (in/yd) Discharge Instruction: Apply over Kerlix as directed. Compression Stockings Add-Ons Electronic Signature(s) Signed: 05/29/2023 11:32:35 AM By: Samuella Bruin Entered By: Samuella Bruin on 05/29/2023 09:47:01 -------------------------------------------------------------------------------- Wound Assessment Details Patient Name: Date of Service: [name] RMA Long. 05/29/2023 9:45 A M Medical Record Number: [id number] Patient Account Number: [id number] Date of Birth/Sex: Treating RN: [birth year] (77 y.o. [name] Primary Care Clayton Bosserman: Dina Rich Other Clinician: Referring Ruthann Angulo: Treating Liv Rallis/Extender: Pincus Badder in Treatment: 17 Wound Status Wound Number: 5 Primary Skin Tear Etiology: Wound Location: Left, Proximal, Anterior Lower Leg Wound Open Wounding Event: Skin Tear/Laceration Status: Date Acquired: 04/02/2023 Comorbid Cataracts, Anemia, Chronic Obstructive Pulmonary Disease Weeks Of Treatment: 7 History: (COPD), Coronary Artery Disease, Peripheral Venous Disease, Clustered Wound: No Raynauds, Rheumatoid Arthritis, Osteoarthritis Photos Wound Measurements Length: (cm) 0 Width: (cm) 0 Zieske, [name] ([id number]) Depth: (cm) 0 Area: (cm) 0 Volume: (cm) 0 % Reduction in Area: 100% % Reduction in Volume: 100% [id number] Page 9 of 9 Epithelialization: Large (67-100%) Tunneling: No Undermining: No Wound Description Classification: Full Thickness Without Exposed Support Structures Wound Margin: Distinct, outline attached Exudate Amount: None Present Foul Odor After Cleansing: No Slough/Fibrino No Wound Bed Granulation Amount: None Present (0%) Exposed Structure Necrotic Amount: None Present (0%) Fascia  Exposed: No Fat Layer (Subcutaneous Tissue) Exposed: No Tendon Exposed: No Muscle Exposed: No Joint Exposed: No Bone Exposed: No Periwound Skin Texture Texture Color No Abnormalities Noted: Yes No Abnormalities Noted: No Ecchymosis: No Moisture Hemosiderin Staining: Yes No Abnormalities Noted: Yes Rubor: No Temperature / Pain Temperature: No Abnormality Electronic Signature(s) Signed: 05/29/2023 11:32:35 AM By: Samuella Bruin Entered By: Samuella Bruin on 05/29/2023 09:47:18 -------------------------------------------------------------------------------- Vitals Details Patient Name: Date of Service: [name] RMA Long. 05/29/2023 9:45 A M Medical Record Number: [id number] Patient Account Number: [id number] Date of Birth/Sex: Treating RN: 1946-08-10 (77 y.o. [name] Primary Care Ollivander See: Dina Rich Other Clinician: Referring Vian Fluegel: Treating Renardo Cheatum/Extender: Pincus Badder in Treatment: 17 Vital Signs Time Taken: 09:38 Temperature (F): 98.6 Height (in): 63 Pulse (bpm): 116 Weight (lbs): 115 Respiratory Rate (breaths/min): 18 Body Mass Index (BMI): 20.4 Blood Pressure (mmHg): 145/67 Reference Range: 80 - 120 mg / dl Electronic Signature(s) Signed: 05/29/2023  11:32:35 AM By: Gelene Mink By: Samuella Bruin on 05/29/2023 09:39:35

## 2023-05-29 NOTE — Progress Notes (Signed)
[name], [name] ([id number]) [id number] Page 1 of 11 Visit Report for 05/29/2023 Chief Complaint Document Details Patient Name: Date of Service: [name], [name] [name] 05/29/2023 9:45 A M Medical Record Number: [id number] Patient Account Number: [id number] Date of Birth/Sex: Treating RN: November 04, 1945 (77 y.o. F) Primary Care Provider: Dina Rich Other Clinician: Referring Provider: Treating Provider/Extender: Pincus Badder in Treatment: 17 Information Obtained from: Patient Chief Complaint Patient seen for complaints of Non-Healing Wound. Electronic Signature(s) Signed: 05/29/2023 10:13:31 AM By: Duanne Guess MD FACS Entered By: Duanne Guess on 05/29/2023 10:13:31 -------------------------------------------------------------------------------- Debridement Details Patient Name: Date of Service: Elaine Hurst [name] 05/29/2023 9:45 A M Medical Record Number: [id number] Patient Account Number: [id number] Date of Birth/Sex: Treating RN: 1946-06-17 (77 y.o. [name] Primary Care Provider: Dina Rich Other Clinician: Referring Provider: Treating Provider/Extender: Pincus Badder in Treatment: 17 Debridement Performed for Assessment: Wound #4 Left,Anterior Lower Leg Performed By: Physician Duanne Guess, MD Debridement Type: Debridement Severity of Tissue Pre Debridement: Fat layer exposed Level of Consciousness (Pre-procedure): Awake and Alert Pre-procedure Verification/Time Out Yes - 09:57 Taken: Start Time: 09:57 Pain Control: Lidocaine 4% T opical Solution Percent of Wound Bed Debrided: 30% T Area Debrided (cm): otal 11.34 Tissue and other material debrided: Non-Viable, Slough, Slough Level: Non-Viable Tissue Debridement Description: Selective/Open Wound Instrument: Curette Bleeding: Minimum Hemostasis Achieved: Pressure Response to Treatment: Procedure was tolerated well Level of Consciousness  (Post- Awake and Alert procedure): Post Debridement Measurements of Total Wound Length: (cm) 8.3 Width: (cm) 5.8 Depth: (cm) 0.1 Volume: (cm) 3.781 Character of Wound/Ulcer Post Debridement: Improved Severity of Tissue Post Debridement: Fat layer exposed Post Procedure Diagnosis [name], [name] K ([id number]) [id number] Page 2 of 11 Same as Pre-procedure Notes scribed for Dr. Lady Gary by Samuella Bruin, RN Electronic Signature(s) Signed: 05/29/2023 10:22:54 AM By: Duanne Guess MD FACS Signed: 05/29/2023 11:32:35 AM By: Gelene Mink By: Samuella Bruin on 05/29/2023 09:59:41 -------------------------------------------------------------------------------- HPI Details Patient Name: Date of Service: Elaine Hurst [name] 05/29/2023 9:45 A M Medical Record Number: [id number] Patient Account Number: [id number] Date of Birth/Sex: Treating RN: 31-Jul-1946 (77 y.o. F) Primary Care Provider: Dina Rich Other Clinician: Referring Provider: Treating Provider/Extender: Pincus Badder in Treatment: 17 History of Present Illness HPI Description: ADMISSION 02/19/2022 This is a 77 year old woman with minimal pertinent medical history. She does have COPD and pulmonary hypertension, but is not diabetic and is not a smoker. About 6 months ago, she and her husband got a new beagle puppy. The puppy scratched her leg and the scratches ultimately deteriorated into ulcerations. Apparently she sought care with a dermatologist who recommended applying a one-to-one mixture of peroxide and water to the wounds followed by a thick layer of Vaseline. She continue this for some time but then saw her primary care provider who told her to discontinue the peroxide. She has not been on any antibiotics for the scratches. Today, there are 3 separate wounds on her anterior tibial surface on the left. They are tender and her leg has localized swelling. There is  yellow slough buildup in each of the wound bases. ABI in clinic today was normal at 1.16. 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 02/25/2022: Over the past week, the wounds themselves have not improved tremendously but she has noticed some stinging and the periwound skin is quite inflamed. I took a culture last week that had low levels of methicillin-resistant Staph aureus. Because it was fairly low level, I did  not prescribe an oral antibiotic. I had planned to change her to mupirocin today, however. We have been using Santyl under Hydrofera Blue with 3 layer compression. 03/04/2022: The wounds have improved quite a bit over the past week. They are less painful and red. She has been taking the prescribed doxycycline but says that it gives her a stomachache. We have been using mupirocin with Hydrofera Blue and 3 layer compression. 03/11/2022: Continued improvement of the wounds. They have contracted quite a bit and have a good surface of healthy granulation tissue present. There is some dried eschar present around all 3 of the lesions. 03/17/2022: No significant change in the wounds from last week. The surface is clean with just a little bit of eschar and slough accumulation. No odor or drainage. No concern for infection. 03/26/2022: All of her wounds are smaller this week. There is good granulation tissue on the surface of each. No slough accumulation. There is just some dry skin around the wounds but not actually involving the wounds. No concern for infection. 04/04/2022: The 2 proximal wounds have healed. The distal wound is much smaller and just has a little bit of dry eschar around the edges. 04/11/2022: Her wound is nearly healed. It is clean without concern for infection. 04/18/2022: Her wound has healed. READMISSION 01/26/2023 About 8 weeks ago, she sustained another scratch from her Beagle on her left anterior tibial surface.. She has been trying to treat the wound at home  with Vaseline. There is thick slough buildup on the wound surface and the periwound skin looks a bit macerated. No overt sign of infection. 01/29/2023: She came in for an unscheduled visit today because her wound began bleeding. It apparently bled quite profusely and her husband used an over-the- counter topical agent to get it to stop. On inspection today, he managed to achieve good hemostasis and there is no ongoing blood loss. 02/03/2023: No further issues with bleeding. There is minimal slough accumulation. Some hypertrophic granulation tissue present. 02/11/2023: She has had a lot more drainage over the past week and there has been more breakdown of the skin around her wound. The wound itself has expanded and there is quite a bit of slough on the surfaces. 02/18/2023: Her wound looks much better this week. The drainage has decreased and the periwound skin is in much improved condition. The wound is smaller and there is just 1 tiny satellite adjacent to the main wound. There is slough accumulation on the surfaces. The culture that I took last week returned with methicillin sensitive Staph aureus. She is currently taking Keflex for this. 02/25/2023: Her wound is smaller again this week with minimal slough accumulation. The periwound looks significantly improved. She has completed her oral Keflex. 03/04/2023: The wound continues to contract. The periwound is completely healed and looks like normal intact skin. There is some slough on the surface of the wound overlying good granulation tissue. [name], [name] ([id number]) [id number] Page 3 of 11 03/11/2023: The wound measured about the same size today, although visually it appears smaller. There is a little bit of slough on the surface with good granulation tissue underneath. 03/17/2023: The wound is a little bit smaller today with light slough on the wound surface. No erythema, induration, or significant drainage. 03/30/2023: The  wound is a little bit smaller again today with minimal slough and eschar accumulation. 04/06/2023: Her skin irritation from the Zetuvit dressing has gotten worse and she has had a fair amount of skin breakdown in the distribution  of the bandage. This is also caused some breakdown of the existing wound. In addition, when she was at a wedding this weekend, she fell and suffered a large skin tear to her proximal left lower leg and left knee. The fat layer is exposed. There is slough accumulation on the surfaces. 04/13/2023: All of the wounds have deteriorated. They are red and angry-looking. There is slough on all of the surfaces. 04/20/2023: There has been significant improvement since last week. The erythema and irritation have resolved. There is minimal slough on the wound surfaces along with a little bit of light periwound eschar. She continues on Keflex. 04/27/2023: Continued improvement of her wounds. All of them are smaller. The knee wound is nearly closed. There is slough and eschar on all of the wound surfaces. She has completed her course of oral antibiotics. 05/04/2023: The wounds are all smaller today with slough and eschar present on all of the surfaces. The moisture balance is leaning a bit towards the dry side, however. 05/12/2023: The wound on her knee is healed. The other skin tear at the tibial tuberosity is nearly closed with just a small open area. The lower leg wounds are about the same size. Moisture balance is better. There is slough on all of the open wound surfaces. 05/20/2023: The wounds measured a little bit smaller today. They have slough accumulation and the patient says everything is quite a bit more tender. There is more periwound erythema present today. 05/29/2023: The skin tears on her upper leg have healed. The lower wounds are all smaller with just some thin slough on the surface. Her tenderness and erythema have resolved. She completed her course of Keflex yesterday. Electronic  Signature(s) Signed: 05/29/2023 10:14:37 AM By: Duanne Guess MD FACS Entered By: Duanne Guess on 05/29/2023 10:14:37 -------------------------------------------------------------------------------- Physical Exam Details Patient Name: Date of Service: Elaine Hurst [name] 05/29/2023 9:45 A M Medical Record Number: [id number] Patient Account Number: [id number] Date of Birth/Sex: Treating RN: 1946-04-14 (77 y.o. F) Primary Care Provider: Dina Rich Other Clinician: Referring Provider: Treating Provider/Extender: Pincus Badder in Treatment: 17 Constitutional Slightly hypertensive. Tachycardic, asymptomatic. . . no acute distress. Respiratory Normal work of breathing on room air. Notes 05/29/2023: The skin tears on her upper leg have healed. The lower wounds are all smaller with just some thin slough on the surface. Her tenderness and erythema have resolved. Electronic Signature(s) Signed: 05/29/2023 10:18:59 AM By: Duanne Guess MD FACS Entered By: Duanne Guess on 05/29/2023 10:18:59 -------------------------------------------------------------------------------- Physician Orders Details Patient Name: Date of Service: Elaine Hurst [name] 05/29/2023 9:45 A M Medical Record Number: [id number] Patient Account Number: [id number] [name], [name] ([id number]) [id number] Page 4 of 11 Date of Birth/Sex: Treating RN: Oct 21, 1946 (77 y.o. [name] Primary Care Provider: Other Clinician: Dina Rich Referring Provider: Treating Provider/Extender: Pincus Badder in Treatment: [phone] Verbal / Phone Orders: No Diagnosis Coding ICD-10 Coding Code Description [phone] Non-pressure chronic ulcer of other part of left lower leg with fat layer exposed I27.20 Pulmonary hypertension, unspecified I51.9 Heart disease, unspecified Follow-up Appointments ppointment in 1 week. - Dr. Lady Gary Room 2 Return A Anesthetic (In  clinic) Topical Lidocaine 4% applied to wound bed Bathing/ Shower/ Hygiene May shower and wash wound with soap and water. - with dressing changes Edema Control - Lymphedema / SCD / Other Elevate legs to the level of the heart or above for 30 minutes daily and/or when sitting for 3-4 times a day throughout the day. A  void standing for long periods of time. Exercise regularly If compression wraps slide down please call wound center and speak with a nurse. Wound Treatment Wound #4 - Lower Leg Wound Laterality: Left, Anterior Cleanser: Soap and Water 1 x Per Week/30 Days Discharge Instructions: May shower and wash wound with dial antibacterial soap and water prior to dressing change. Cleanser: Vashe 5.8 (oz) 1 x Per Week/30 Days Discharge Instructions: Cleanse the wound with Vashe prior to applying a clean dressing using gauze sponges, not tissue or cotton balls. Peri-Wound Care: Triamcinolone 15 (g) 1 x Per Week/30 Days Discharge Instructions: Use triamcinolone 15 (g) as directed Peri-Wound Care: Sween Lotion (Moisturizing lotion) 1 x Per Week/30 Days Discharge Instructions: Apply moisturizing lotion as directed Prim Dressing: MediHoney Gel, tube 1.5 (oz) 1 x Per Week/30 Days ary Discharge Instructions: Apply to wound bed as instructed Secondary Dressing: T Non-adherent Dressing, 2x3 in 1 x Per Week/30 Days elfa Discharge Instructions: Apply over primary dressing as directed. Compression Wrap: Kerlix Roll 4.5x3.1 (in/yd) 1 x Per Week/30 Days Discharge Instructions: Apply Kerlix and Coban compression as directed. Compression Wrap: Coban Self-Adherent Wrap 4x5 (in/yd) 1 x Per Week/30 Days Discharge Instructions: Apply over Kerlix as directed. Patient Medications llergies: Sulfa (Sulfonamide Antibiotics), adhesive, amoxicillin, ciprofloxacin, Statins-Hmg-Coa Reductase Inhibitors A Notifications Medication Indication Start End 05/29/2023 lidocaine DOSE topical 4 % cream - cream  topical Electronic Signature(s) Signed: 05/29/2023 10:22:54 AM By: Duanne Guess MD FACS Entered By: Duanne Guess on 05/29/2023 10:21:10 [name] ([id number]) [id number] Page 5 of 11 -------------------------------------------------------------------------------- Problem List Details Patient Name: Date of Service: [name], [name] [name] 05/29/2023 9:45 A M Medical Record Number: [id number] Patient Account Number: [id number] Date of Birth/Sex: Treating RN: Feb 17, 1946 (77 y.o. F) Primary Care Provider: Dina Rich Other Clinician: Referring Provider: Treating Provider/Extender: Pincus Badder in Treatment: 17 Active Problems ICD-10 Encounter Code Description Active Date MDM Diagnosis [phone] Non-pressure chronic ulcer of other part of left lower leg with fat layer exposed4/10/2022 No Yes I27.20 Pulmonary hypertension, unspecified 01/26/2023 No Yes I51.9 Heart disease, unspecified 01/26/2023 No Yes Inactive Problems Resolved Problems ICD-10 Code Description Active Date Resolved Date L97.122 Non-pressure chronic ulcer of left thigh with fat layer exposed 04/06/2023 04/06/2023 Electronic Signature(s) Signed: 05/29/2023 10:12:39 AM By: Duanne Guess MD FACS Entered By: Duanne Guess on 05/29/2023 10:12:39 -------------------------------------------------------------------------------- Progress Note Details Patient Name: Date of Service: Elaine Hurst [name] 05/29/2023 9:45 A M Medical Record Number: [id number] Patient Account Number: [id number] Date of Birth/Sex: Treating RN: [birth year] (77 y.o. F) Primary Care Provider: Dina Rich Other Clinician: Referring Provider: Treating Provider/Extender: Pincus Badder in Treatment: 17 Subjective Chief Complaint Information obtained from Patient Patient seen for complaints of Non-Healing Wound. History of Present Illness (HPI) ADMISSION 02/19/2022 This is a  77 year old woman with minimal pertinent medical history. She does have COPD and pulmonary hypertension, but is not diabetic and is not a smoker. [name], [name] ([id number]) [id number] Page 6 of 11 About 6 months ago, she and her husband got a new beagle puppy. The puppy scratched her leg and the scratches ultimately deteriorated into ulcerations. Apparently she sought care with a dermatologist who recommended applying a one-to-one mixture of peroxide and water to the wounds followed by a thick layer of Vaseline. She continue this for some time but then saw her primary care provider who told her to discontinue the peroxide. She has not been on any antibiotics for the scratches. Today, there are 3 separate wounds on her anterior  tibial surface on the left. They are tender and her leg has localized swelling. There is yellow slough buildup in each of the wound bases. ABI in clinic today was normal at 1.16. 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 02/25/2022: Over the past week, the wounds themselves have not improved tremendously but she has noticed some stinging and the periwound skin is quite inflamed. I took a culture last week that had low levels of methicillin-resistant Staph aureus. Because it was fairly low level, I did not prescribe an oral antibiotic. I had planned to change her to mupirocin today, however. We have been using Santyl under Hydrofera Blue with 3 layer compression. 03/04/2022: The wounds have improved quite a bit over the past week. They are less painful and red. She has been taking the prescribed doxycycline but says that it gives her a stomachache. We have been using mupirocin with Hydrofera Blue and 3 layer compression. 03/11/2022: Continued improvement of the wounds. They have contracted quite a bit and have a good surface of healthy granulation tissue present. There is some dried eschar present around all 3 of the  lesions. 03/17/2022: No significant change in the wounds from last week. The surface is clean with just a little bit of eschar and slough accumulation. No odor or drainage. No concern for infection. 03/26/2022: All of her wounds are smaller this week. There is good granulation tissue on the surface of each. No slough accumulation. There is just some dry skin around the wounds but not actually involving the wounds. No concern for infection. 04/04/2022: The 2 proximal wounds have healed. The distal wound is much smaller and just has a little bit of dry eschar around the edges. 04/11/2022: Her wound is nearly healed. It is clean without concern for infection. 04/18/2022: Her wound has healed. READMISSION 01/26/2023 About 8 weeks ago, she sustained another scratch from her Beagle on her left anterior tibial surface.. She has been trying to treat the wound at home with Vaseline. There is thick slough buildup on the wound surface and the periwound skin looks a bit macerated. No overt sign of infection. 01/29/2023: She came in for an unscheduled visit today because her wound began bleeding. It apparently bled quite profusely and her husband used an over-the- counter topical agent to get it to stop. On inspection today, he managed to achieve good hemostasis and there is no ongoing blood loss. 02/03/2023: No further issues with bleeding. There is minimal slough accumulation. Some hypertrophic granulation tissue present. 02/11/2023: She has had a lot more drainage over the past week and there has been more breakdown of the skin around her wound. The wound itself has expanded and there is quite a bit of slough on the surfaces. 02/18/2023: Her wound looks much better this week. The drainage has decreased and the periwound skin is in much improved condition. The wound is smaller and there is just 1 tiny satellite adjacent to the main wound. There is slough accumulation on the surfaces. The culture that I took last week  returned with methicillin sensitive Staph aureus. She is currently taking Keflex for this. 02/25/2023: Her wound is smaller again this week with minimal slough accumulation. The periwound looks significantly improved. She has completed her oral Keflex. 03/04/2023: The wound continues to contract. The periwound is completely healed and looks like normal intact skin. There is some slough on the surface of the wound overlying good granulation tissue. 03/11/2023: The wound measured about the same size today, although  visually it appears smaller. There is a little bit of slough on the surface with good granulation tissue underneath. 03/17/2023: The wound is a little bit smaller today with light slough on the wound surface. No erythema, induration, or significant drainage. 03/30/2023: The wound is a little bit smaller again today with minimal slough and eschar accumulation. 04/06/2023: Her skin irritation from the Zetuvit dressing has gotten worse and she has had a fair amount of skin breakdown in the distribution of the bandage. This is also caused some breakdown of the existing wound. In addition, when she was at a wedding this weekend, she fell and suffered a large skin tear to her proximal left lower leg and left knee. The fat layer is exposed. There is slough accumulation on the surfaces. 04/13/2023: All of the wounds have deteriorated. They are red and angry-looking. There is slough on all of the surfaces. 04/20/2023: There has been significant improvement since last week. The erythema and irritation have resolved. There is minimal slough on the wound surfaces along with a little bit of light periwound eschar. She continues on Keflex. 04/27/2023: Continued improvement of her wounds. All of them are smaller. The knee wound is nearly closed. There is slough and eschar on all of the wound surfaces. She has completed her course of oral antibiotics. 05/04/2023: The wounds are all smaller today with slough and eschar  present on all of the surfaces. The moisture balance is leaning a bit towards the dry side, however. 05/12/2023: The wound on her knee is healed. The other skin tear at the tibial tuberosity is nearly closed with just a small open area. The lower leg wounds are about the same size. Moisture balance is better. There is slough on all of the open wound surfaces. 05/20/2023: The wounds measured a little bit smaller today. They have slough accumulation and the patient says everything is quite a bit more tender. There is more periwound erythema present today. 05/29/2023: The skin tears on her upper leg have healed. The lower wounds are all smaller with just some thin slough on the surface. Her tenderness and erythema have resolved. She completed her course of Keflex yesterday. Patient History Information obtained from Patient, Caregiver. Family History Unknown History. Social History Former smoker, Marital Status - Married, Alcohol Use - Never, Drug Use - No History, Caffeine Use - Rarely. [name], [name] ([id number]) [id number] Page 7 of 11 Medical History Eyes Patient has history of Cataracts - bil removed Denies history of Glaucoma, Optic Neuritis Ear/Nose/Mouth/Throat Denies history of Chronic sinus problems/congestion, Middle ear problems Hematologic/Lymphatic Patient has history of Anemia Respiratory Patient has history of Chronic Obstructive Pulmonary Disease (COPD) Cardiovascular Patient has history of Coronary Artery Disease, Peripheral Venous Disease - varicose veins Endocrine Denies history of Type I Diabetes, Type II Diabetes Genitourinary Denies history of End Stage Renal Disease Immunological Patient has history of Raynauds Denies history of Lupus Erythematosus, Scleroderma Integumentary (Skin) Denies history of History of Burn Musculoskeletal Patient has history of Rheumatoid Arthritis, Osteoarthritis Denies history of Gout Oncologic Denies  history of Received Chemotherapy, Received Radiation Psychiatric Denies history of Anorexia/bulimia, Confinement Anxiety Hospitalization/Surgery History - bil cataract removal. - left shoiulder replacement. - right rotator cuff repair. - multiple lumbar spine surgeries. - melanoma removed left arm and chest. - partial excision bone left 2nd toe. Medical A Surgical History Notes nd Hematologic/Lymphatic varicose vein of leg, mixed hyperlipidemia Respiratory dyspnea, chronic bronchitis, pulmonary hypertension, pulmonary emphysema Cardiovascular aortic calcification, tachycardia, left ventricular dysfunction, bradycardia Gastrointestinal [name]  Endocrine prediabetes, hypothyroidism Genitourinary bladder disorder Integumentary (Skin) contact dermatitis Musculoskeletal acquired plantar porokeratosis, neurogenic claudication d/t lumbar spinal stenosis, supraspinatus tendinitis, restless leg syndrome, spinal stenosis of lumbar region Neurologic TIA, insomnia Objective Constitutional Slightly hypertensive. Tachycardic, asymptomatic. no acute distress. Vitals Time Taken: 9:38 AM, Height: 63 in, Weight: 115 lbs, BMI: 20.4, Temperature: 98.6 F, Pulse: 116 bpm, Respiratory Rate: 18 breaths/min, Blood Pressure: 145/67 mmHg. Respiratory Normal work of breathing on room air. General Notes: 05/29/2023: The skin tears on her upper leg have healed. The lower wounds are all smaller with just some thin slough on the surface. Her tenderness and erythema have resolved. Integumentary (Hair, Skin) Wound #4 status is Open. Original cause of wound was Trauma. The date acquired was: 11/27/2022. The wound has been in treatment 17 weeks. The wound is located on the Left,Anterior Lower Leg. The wound measures 8.3cm length x 5.8cm width x 0.1cm depth; 37.809cm^2 area and 3.781cm^3 volume. There is Fat Layer (Subcutaneous Tissue) exposed. There is no tunneling or undermining noted. There is a medium amount of  serosanguineous drainage noted. The wound margin is distinct with the outline attached to the wound base. There is small (1-33%) red granulation within the wound bed. There is a large (67-100%) amount of necrotic tissue within the wound bed including Adherent Slough. The periwound skin appearance had no abnormalities noted for moisture. The periwound skin appearance exhibited: Scarring, Hemosiderin Staining. The periwound skin appearance did not exhibit: Callus, Crepitus, Excoriation, Induration, Rash, Atrophie Blanche, Cyanosis, Ecchymosis, Mottled, Pallor, Rubor, Erythema. Periwound temperature was noted as No Abnormality. Wound #5 status is Open. Original cause of wound was Skin T ear/Laceration. The date acquired was: 04/02/2023. The wound has been in treatment 7 weeks. The wound is located on the Left,Proximal,Anterior Lower Leg. The wound measures 0cm length x 0cm width x 0cm depth; 0cm^2 area and 0cm^3 volume. There is ZAYAH, KEILMAN ([id number]) [id number] Page 8 of 11 no tunneling or undermining noted. There is a none present amount of drainage noted. The wound margin is distinct with the outline attached to the wound base. There is no granulation within the wound bed. There is no necrotic tissue within the wound bed. The periwound skin appearance had no abnormalities noted for texture. The periwound skin appearance had no abnormalities noted for moisture. The periwound skin appearance exhibited: Hemosiderin Staining. The periwound skin appearance did not exhibit: Ecchymosis, Rubor. Periwound temperature was noted as No Abnormality. Assessment Active Problems ICD-10 Non-pressure chronic ulcer of other part of left lower leg with fat layer exposed Pulmonary hypertension, unspecified Heart disease, unspecified Procedures Wound #4 Pre-procedure diagnosis of Wound #4 is a Venous Leg Ulcer located on the Left,Anterior Lower Leg .Severity of Tissue Pre Debridement is:  Fat layer exposed. There was a Selective/Open Wound Non-Viable Tissue Debridement with a total area of 11.34 sq cm performed by Duanne Guess, MD. With the following instrument(s): Curette to remove Non-Viable tissue/material. Material removed includes [facility] after achieving pain control using Lidocaine 4% Topical Solution. No specimens were taken. A time out was conducted at 09:57, prior to the start of the procedure. A Minimum amount of bleeding was controlled with Pressure. The procedure was tolerated well. Post Debridement Measurements: 8.3cm length x 5.8cm width x 0.1cm depth; 3.781cm^3 volume. Character of Wound/Ulcer Post Debridement is improved. Severity of Tissue Post Debridement is: Fat layer exposed. Post procedure Diagnosis Wound #4: Same as Pre-Procedure General Notes: scribed for Dr. Lady Gary by Samuella Bruin, RN. Plan Follow-up Appointments: Return Appointment in 1  week. - Dr. Lady Gary Room 2 Anesthetic: (In clinic) Topical Lidocaine 4% applied to wound bed Bathing/ Shower/ Hygiene: May shower and wash wound with soap and water. - with dressing changes Edema Control - Lymphedema / SCD / Other: Elevate legs to the level of the heart or above for 30 minutes daily and/or when sitting for 3-4 times a day throughout the day. Avoid standing for long periods of time. Exercise regularly If compression wraps slide down please call wound center and speak with a nurse. The following medication(s) was prescribed: lidocaine topical 4 % cream cream topical was prescribed at facility WOUND #4: - Lower Leg Wound Laterality: Left, Anterior Cleanser: Soap and Water 1 x Per Week/30 Days Discharge Instructions: May shower and wash wound with dial antibacterial soap and water prior to dressing change. Cleanser: Vashe 5.8 (oz) 1 x Per Week/30 Days Discharge Instructions: Cleanse the wound with Vashe prior to applying a clean dressing using gauze sponges, not tissue or cotton balls. Peri-Wound  Care: Triamcinolone 15 (g) 1 x Per Week/30 Days Discharge Instructions: Use triamcinolone 15 (g) as directed Peri-Wound Care: Sween Lotion (Moisturizing lotion) 1 x Per Week/30 Days Discharge Instructions: Apply moisturizing lotion as directed Prim Dressing: MediHoney Gel, tube 1.5 (oz) 1 x Per Week/30 Days ary Discharge Instructions: Apply to wound bed as instructed Secondary Dressing: T Non-adherent Dressing, 2x3 in 1 x Per Week/30 Days elfa Discharge Instructions: Apply over primary dressing as directed. Com pression Wrap: Kerlix Roll 4.5x3.1 (in/yd) 1 x Per Week/30 Days Discharge Instructions: Apply Kerlix and Coban compression as directed. Com pression Wrap: Coban Self-Adherent Wrap 4x5 (in/yd) 1 x Per Week/30 Days Discharge Instructions: Apply over Kerlix as directed. 05/29/2023: The skin tears on her upper leg have healed. The lower wounds are all smaller with just some thin slough on the surface. Her tenderness and erythema have resolved. She completed her course of Keflex yesterday. I used a curette to debride slough from each of her wounds. We will continue Medihoney to each site, covered with T and her leg wrapped in Kerlix and elfa Coban. Her husband has been changing the dressings regularly at home I think this has been an improvement. Follow-up in 1 week. Electronic Signature(s) Signed: 05/29/2023 10:21:58 AM By: Duanne Guess MD FACS Entered By: Duanne Guess on 05/29/2023 10:21:58 [name] ([id number]) [id number] Page 9 of 11 -------------------------------------------------------------------------------- HxROS Details Patient Name: Date of Service: [name], [name] [name] 05/29/2023 9:45 A M Medical Record Number: [id number] Patient Account Number: [id number] Date of Birth/Sex: Treating RN: 10-09-1946 (77 y.o. F) Primary Care Provider: Dina Rich Other Clinician: Referring Provider: Treating Provider/Extender: Pincus Badder in Treatment: 17 Information Obtained From Patient Caregiver Eyes Medical History: Positive for: Cataracts - bil removed Negative for: Glaucoma; Optic Neuritis Ear/Nose/Mouth/Throat Medical History: Negative for: Chronic sinus problems/congestion; Middle ear problems Hematologic/Lymphatic Medical History: Positive for: Anemia Past Medical History Notes: varicose vein of leg, mixed hyperlipidemia Respiratory Medical History: Positive for: Chronic Obstructive Pulmonary Disease (COPD) Past Medical History Notes: dyspnea, chronic bronchitis, pulmonary hypertension, pulmonary emphysema Cardiovascular Medical History: Positive for: Coronary Artery Disease; Peripheral Venous Disease - varicose veins Past Medical History Notes: aortic calcification, tachycardia, left ventricular dysfunction, bradycardia Gastrointestinal Medical History: Past Medical History Notes: Gerd Endocrine Medical History: Negative for: Type I Diabetes; Type II Diabetes Past Medical History Notes: prediabetes, hypothyroidism Genitourinary Medical History: Negative for: End Stage Renal Disease Past Medical History Notes: bladder disorder Immunological Medical History: Positive for: Raynauds Negative for: Lupus Erythematosus;  Scleroderma Integumentary (Skin) [name], [name] ([id number]) [id number] Page 10 of 11 Medical History: Negative for: History of Burn Past Medical History Notes: contact dermatitis Musculoskeletal Medical History: Positive for: Rheumatoid Arthritis; Osteoarthritis Negative for: Gout Past Medical History Notes: acquired plantar porokeratosis, neurogenic claudication d/t lumbar spinal stenosis, supraspinatus tendinitis, restless leg syndrome, spinal stenosis of lumbar region Neurologic Medical History: Past Medical History Notes: TIA, insomnia Oncologic Medical History: Negative for: Received Chemotherapy; Received  Radiation Psychiatric Medical History: Negative for: Anorexia/bulimia; Confinement Anxiety HBO Extended History Items Eyes: Cataracts Immunizations Pneumococcal Vaccine: Received Pneumococcal Vaccination: Yes Received Pneumococcal Vaccination On or After 60th Birthday: Yes Implantable Devices None Hospitalization / Surgery History Type of Hospitalization/Surgery bil cataract removal left shoiulder replacement right rotator cuff repair multiple lumbar spine surgeries melanoma removed left arm and chest partial excision bone left 2nd toe Family and Social History Unknown History: Yes; Former smoker; Marital Status - Married; Alcohol Use: Never; Drug Use: No History; Caffeine Use: Rarely; Financial Concerns: No; Food, Clothing or Shelter Needs: No; Support System Lacking: No; Transportation Concerns: No Electronic Signature(s) Signed: 05/29/2023 10:22:54 AM By: Duanne Guess MD FACS Entered By: Duanne Guess on 05/29/2023 10:18:24 -------------------------------------------------------------------------------- SuperBill Details Patient Name: Date of Service: Elaine Hurst [name] 05/29/2023 Medical Record Number: [id number] Patient Account Number: [id number] Date of Birth/Sex: Treating RN: 1946/10/08 (77 y.o. F) Primary Care Provider: Dina Rich Other Clinician: Referring Provider: Treating Provider/Extender: Pincus Badder in Treatment: [address] [place] K ([id number]) [id number] Page 11 of 11 Diagnosis Coding ICD-10 Codes Code Description [phone] Non-pressure chronic ulcer of other part of left lower leg with fat layer exposed I27.20 Pulmonary hypertension, unspecified I51.9 Heart disease, unspecified Facility Procedures : CPT4 Code: [id number] Description: 97597 - DEBRIDE WOUND 1ST 20 SQ CM OR < ICD-10 Diagnosis Description L97.822 Non-pressure chronic ulcer of other part of left lower leg with fat layer expose Modifier:  d Quantity: 1 Physician Procedures : CPT4 Code Description Modifier [id number] - WC PHYS LEVEL 3 - EST PT 25 ICD-10 Diagnosis Description L97.822 Non-pressure chronic ulcer of other part of left lower leg with fat layer exposed I27.20 Pulmonary hypertension, unspecified I51.9 Heart  disease, unspecified Quantity: 1 : [id number] 97597 - WC PHYS DEBR WO ANESTH 20 SQ CM ICD-10 Diagnosis Description L97.822 Non-pressure chronic ulcer of other part of left lower leg with fat layer exposed Quantity: 1 Electronic Signature(s) Signed: 05/29/2023 10:22:23 AM By: Duanne Guess MD FACS Entered By: Duanne Guess on 05/29/2023 10:22:23

## 2023-06-04 ENCOUNTER — Encounter (HOSPITAL_BASED_OUTPATIENT_CLINIC_OR_DEPARTMENT_OTHER): Payer: PPO | Admitting: General Surgery

## 2023-06-04 DIAGNOSIS — L97822 Non-pressure chronic ulcer of other part of left lower leg with fat layer exposed: Secondary | ICD-10-CM | POA: Diagnosis not present

## 2023-06-05 NOTE — Progress Notes (Addendum)
LARAY, RUELL ([id number]) [id number] Page 1 of 8 Visit Report for 06/04/2023 Arrival Information Details Patient Name: Date of Service: [name], [name] [name] 06/04/2023 10:45 A M Medical Record Number: [id number] Patient Account Number: [id number] Date of Birth/Sex: Treating RN: 1946/01/10 (77 y.o. F) Primary Care Russell Engelstad: Dina Rich Other Clinician: Referring Kenly Xiao: Treating Ikran Patman/Extender: Pincus Badder in Treatment: 18 Visit Information History Since Last Visit All ordered tests and consults were completed: No Patient Arrived: Ambulatory Added or deleted any medications: No Arrival Time: 10:37 Any new allergies or adverse reactions: No Accompanied By: husband Had a fall or experienced change in No Transfer Assistance: None activities of daily living that may affect Patient Identification Verified: Yes risk of falls: Secondary Verification Process Completed: Yes Signs or symptoms of abuse/neglect since last visito No Patient Requires Transmission-Based Precautions: No Hospitalized since last visit: No Patient Has Alerts: Yes Implantable device outside of the clinic excluding No Patient Alerts: ABI L 1.16 02/19/22 cellular tissue based products placed in the center since last visit: Pain Present Now: No Electronic Signature(s) Signed: 06/05/2023 7:48:16 AM By: Dayton Scrape Entered By: Dayton Scrape on 06/04/2023 10:38:03 -------------------------------------------------------------------------------- Encounter Discharge Information Details Patient Name: Date of Service: [name] [name] 06/04/2023 10:45 A M Medical Record Number: [id number] Patient Account Number: [id number] Date of Birth/Sex: Treating RN: [birth year] (77 y.o. [name] Primary Care Imanie Darrow: Dina Rich Other Clinician: Referring Keyshawna Prouse: Treating Ardeth Repetto/Extender: Pincus Badder in Treatment: 18 Encounter Discharge  Information Items Post Procedure Vitals Discharge Condition: Stable Temperature (F): 98.4 Ambulatory Status: Cane Pulse (bpm): 106 Discharge Destination: Home Respiratory Rate (breaths/min): 18 Transportation: Private Auto Blood Pressure (mmHg): 132/62 Accompanied By: husband Schedule Follow-up Appointment: Yes Clinical Summary of Care: Patient Declined Electronic Signature(s) Signed: 06/04/2023 11:59:38 AM By: Samuella Bruin Entered By: Samuella Bruin on 06/04/2023 11:53:55 Fleig, Myishia K ([id number]) [id number] Page 2 of 8 -------------------------------------------------------------------------------- Lower Extremity Assessment Details Patient Name: Date of Service: [name], BOYLAN [name] 06/04/2023 10:45 A M Medical Record Number: [id number] Patient Account Number: [id number] Date of Birth/Sex: Treating RN: [birth year] (77 y.o. [name] Primary Care Roselia Snipe: Dina Rich Other Clinician: Referring Trudy Kory: Treating Itzael Liptak/Extender: Pincus Badder in Treatment: 18 Edema Assessment Assessed: [name]: No] [name]: No] Edema: [Left: N] [Right: o] Calf Left: Right: Point of Measurement: 25 cm From Medial Instep 29.5 cm Ankle Left: Right: Point of Measurement: 10 cm From Medial Instep 19.8 cm Vascular Assessment Pulses: Dorsalis Pedis Palpable: [Left:Yes] Extremity colors, hair growth, and conditions: Extremity Color: [Left:Hyperpigmented] Hair Growth on Extremity: [Left:No] Temperature of Extremity: [Left:Warm < 3 seconds] Electronic Signature(s) Signed: 06/04/2023 11:59:38 AM By: Samuella Bruin Entered By: Samuella Bruin on 06/04/2023 10:44:24 -------------------------------------------------------------------------------- Multi Wound Chart Details Patient Name: Date of Service: [name] [name] 06/04/2023 10:45 A M Medical Record Number: [id number] Patient Account Number: [id number] Date of Birth/Sex:  Treating RN: 11-27-1945 (77 y.o. F) Primary Care Jensen Cheramie: Dina Rich Other Clinician: Referring Bradley Bostelman: Treating Taiwo Fish/Extender: Pincus Badder in Treatment: 18 Vital Signs Height(in): 63 Pulse(bpm): 106 Weight(lbs): 115 Blood Pressure(mmHg): 132/65 Body Mass Index(BMI): 20.4 Temperature(F): 98.4 Respiratory Rate(breaths/min): 18 [4:Photos:] [N/A:N/A] Left, Anterior Lower Leg N/A N/A Wound Location: Trauma N/A N/A Wounding Event: Venous Leg Ulcer N/A N/A Primary Etiology: Cataracts, Anemia, Chronic N/A N/A Comorbid History: Obstructive Pulmonary Disease (COPD), Coronary Artery Disease, Peripheral Venous Disease, Raynauds, Rheumatoid Arthritis, Osteoarthritis 11/27/2022 N/A N/A Date Acquired: 18 N/A N/A Weeks of Treatment: Open N/A N/A Wound Status: No  N/A N/A Wound Recurrence: Yes N/A N/A Clustered Wound: 4 N/A N/A Clustered Quantity: 7.8x5.5x0.1 N/A N/A Measurements L x W x D (cm) 33.694 N/A N/A A (cm) : rea 3.369 N/A N/A Volume (cm) : -286.50% N/A N/A % Reduction in A rea: -286.40% N/A N/A % Reduction in Volume: Full Thickness Without Exposed N/A N/A Classification: Support Structures Medium N/A N/A Exudate A mount: Serosanguineous N/A N/A Exudate Type: red, brown N/A N/A Exudate Color: Distinct, outline attached N/A N/A Wound Margin: Small (1-33%) N/A N/A Granulation A mount: Red N/A N/A Granulation Quality: Large (67-100%) N/A N/A Necrotic A mount: Fat Layer (Subcutaneous Tissue): Yes N/A N/A Exposed Structures: Fascia: No Tendon: No Muscle: No Joint: No Bone: No Medium (34-66%) N/A N/A Epithelialization: Debridement - Selective/Open Wound N/A N/A Debridement: Pre-procedure Verification/Time Out 10:51 N/A N/A Taken: Lidocaine 5% topical ointment N/A N/A Pain Control: Slough N/A N/A Tissue Debrided: Non-Viable Tissue N/A N/A Level: 16.84 N/A N/A Debridement A (sq cm): rea Curette N/A  N/A Instrument: Minimum N/A N/A Bleeding: Pressure N/A N/A Hemostasis A chieved: Procedure was tolerated well N/A N/A Debridement Treatment Response: 7.8x5.5x0.1 N/A N/A Post Debridement Measurements L x W x D (cm) 3.369 N/A N/A Post Debridement Volume: (cm) Scarring: Yes N/A N/A Periwound Skin Texture: Excoriation: No Induration: No Callus: No Crepitus: No Rash: No Maceration: Yes N/A N/A Periwound Skin Moisture: Dry/Scaly: No Hemosiderin Staining: Yes N/A N/A Periwound Skin Color: Atrophie Blanche: No Cyanosis: No Ecchymosis: No Erythema: No Mottled: No Pallor: No Rubor: No No Abnormality N/A N/A Temperature: Debridement N/A N/A Procedures Performed: Treatment Notes Wound #4 (Lower Leg) Wound Laterality: Left, Anterior Cleanser Soap and Water Discharge Instruction: May shower and wash wound with dial antibacterial soap and water prior to dressing change. Vashe 5.8 (oz) Discharge Instruction: Cleanse the wound with Vashe prior to applying a clean dressing using gauze sponges, not tissue or cotton balls. JYA, BOGACKI ([id number]) [id number] Page 4 of 8 Peri-Wound Care Triamcinolone 15 (g) Discharge Instruction: Use triamcinolone 15 (g) as directed Sween Lotion (Moisturizing lotion) Discharge Instruction: Apply moisturizing lotion as directed Topical Primary Dressing MediHoney Gel, tube 1.5 (oz) Discharge Instruction: Apply to wound bed as instructed Secondary Dressing T Non-adherent Dressing, 2x3 in elfa Discharge Instruction: Apply over primary dressing as directed. Secured With Compression Wrap Kerlix Roll 4.5x3.1 (in/yd) Discharge Instruction: Apply Kerlix and Coban compression as directed. Coban Self-Adherent Wrap 4x5 (in/yd) Discharge Instruction: Apply over Kerlix as directed. Compression Stockings Add-Ons Electronic Signature(s) Signed: 06/05/2023 7:58:13 AM By: Duanne Guess MD FACS Entered By: Duanne Guess on  06/05/2023 07:58:13 -------------------------------------------------------------------------------- Multi-Disciplinary Care Plan Details Patient Name: Date of Service: [name] [name] 06/04/2023 10:45 A M Medical Record Number: [id number] Patient Account Number: [id number] Date of Birth/Sex: Treating RN: December 17, 1945 (77 y.o. [name] Primary Care Latiffany Harwick: Dina Rich Other Clinician: Referring Adonia Porada: Treating Shae Augello/Extender: Pincus Badder in Treatment: 18 Active Inactive Nutrition Nursing Diagnoses: Potential for alteratiion in Nutrition/Potential for imbalanced nutrition Goals: Patient/caregiver agrees to and verbalizes understanding of need to obtain nutritional consultation Date Initiated: 01/26/2023 Target Resolution Date: 07/24/2023 Goal Status: Active Patient/caregiver will maintain therapeutic glucose control Date Initiated: 01/26/2023 Target Resolution Date: 07/24/2023 Goal Status: Active Interventions: Assess patient nutrition upon admission and as needed per policy Provide education on nutrition Treatment Activities: Giving encouragement to exercise : 01/26/2023 Notes: SKYLARR, NIM ([id number]) [id number] Page 5 of 8 Pain, Acute or Chronic Nursing Diagnoses: Pain, acute or chronic: actual or potential Potential alteration in comfort, pain  Goals: Patient will verbalize adequate pain control and receive pain control interventions during procedures as needed Date Initiated: 01/26/2023 Date Inactivated: 05/29/2023 Target Resolution Date: 05/29/2023 Goal Status: Met Patient/caregiver will verbalize comfort level met Date Initiated: 01/26/2023 Target Resolution Date: 06/26/2023 Goal Status: Active Interventions: Complete pain assessment as per visit requirements Encourage patient to take pain medications as prescribed Provide education on pain management Treatment Activities: Administer pain control measures as  ordered : 01/26/2023 Notes: Electronic Signature(s) Signed: 06/04/2023 11:59:38 AM By: Samuella Bruin Entered By: Samuella Bruin on 06/04/2023 11:52:39 -------------------------------------------------------------------------------- Pain Assessment Details Patient Name: Date of Service: [name] [name] 06/04/2023 10:45 A M Medical Record Number: [id number] Patient Account Number: [id number] Date of Birth/Sex: Treating RN: 03-24-1946 (77 y.o. F) Primary Care Stachia Slutsky: Dina Rich Other Clinician: Referring Hanako Tipping: Treating Eastyn Dattilo/Extender: Pincus Badder in Treatment: 18 Active Problems Location of Pain Severity and Description of Pain Patient Has Paino No Site Locations Pain Management and Medication Current Pain Management: Electronic Signature(s) Signed: 06/05/2023 7:48:16 AM By: Deniece Portela, Kaylana K 06/05/2023 7:48:16 AM By: Dayton Scrape Signed: ([id number]) [id number] Page 6 of 8 Entered By: Dayton Scrape on 06/04/2023 10:38:37 -------------------------------------------------------------------------------- Patient/Caregiver Education Details Patient Name: Date of Service: SIDDALEE, BARRERAS [name] 8/8/2024andnbsp10:45 A M Medical Record Number: [id number] Patient Account Number: [id number] Date of Birth/Gender: Treating RN: 1946/07/29 (77 y.o. [name] Primary Care Physician: Dina Rich Other Clinician: Referring Physician: Treating Physician/Extender: Pincus Badder in Treatment: 18 Education Assessment Education Provided To: Patient Education Topics Provided Wound/Skin Impairment: Methods: Explain/Verbal Responses: Reinforcements needed, State content correctly Electronic Signature(s) Signed: 06/04/2023 11:59:38 AM By: Samuella Bruin Entered By: Samuella Bruin on 06/04/2023 11:52:56 -------------------------------------------------------------------------------- Wound  Assessment Details Patient Name: Date of Service: [name] [name] 06/04/2023 10:45 A M Medical Record Number: [id number] Patient Account Number: [id number] Date of Birth/Sex: Treating RN: 1945-12-13 (77 y.o. [name] Primary Care Damyn Weitzel: Dina Rich Other Clinician: Referring Khylee Algeo: Treating Floretta Petro/Extender: Pincus Badder in Treatment: 18 Wound Status Wound Number: 4 Primary Venous Leg Ulcer Etiology: Wound Location: Left, Anterior Lower Leg Wound Open Wounding Event: Trauma Status: Date Acquired: 11/27/2022 Comorbid Cataracts, Anemia, Chronic Obstructive Pulmonary Disease Weeks Of Treatment: 18 History: (COPD), Coronary Artery Disease, Peripheral Venous Disease, Clustered Wound: Yes Raynauds, Rheumatoid Arthritis, Osteoarthritis Photos [name], [name] ([id number]) [id number] Page 7 of 8 Wound Measurements Length: (cm) Width: (cm) Depth: (cm) Clustered Quantity: Area: (cm) Volume: (cm) 7.8 % Reduction in Area: -286.5% 5.5 % Reduction in Volume: -286.4% 0.1 Epithelialization: Medium (34-66%) 4 Tunneling: No 33.694 Undermining: No 3.369 Wound Description Classification: Full Thickness Without Exposed Sup Wound Margin: Distinct, outline attached Exudate Amount: Medium Exudate Type: Serosanguineous Exudate Color: red, brown port Structures Foul Odor After Cleansing: No Slough/Fibrino Yes Wound Bed Granulation Amount: Small (1-33%) Exposed Structure Granulation Quality: Red Fascia Exposed: No Necrotic Amount: Large (67-100%) Fat Layer (Subcutaneous Tissue) Exposed: Yes Necrotic Quality: Adherent Slough Tendon Exposed: No Muscle Exposed: No Joint Exposed: No Bone Exposed: No Periwound Skin Texture Texture Color No Abnormalities Noted: No No Abnormalities Noted: No Callus: No Atrophie Blanche: No Crepitus: No Cyanosis: No Excoriation: No Ecchymosis: No Induration: No Erythema: No Rash:  No Hemosiderin Staining: Yes Scarring: Yes Mottled: No Pallor: No Moisture Rubor: No No Abnormalities Noted: Yes Temperature / Pain Temperature: No Abnormality Treatment Notes Wound #4 (Lower Leg) Wound Laterality: Left, Anterior Cleanser Soap and Water Discharge Instruction: May shower and wash wound with dial antibacterial soap and water prior  to dressing change. Vashe 5.8 (oz) Discharge Instruction: Cleanse the wound with Vashe prior to applying a clean dressing using gauze sponges, not tissue or cotton balls. Peri-Wound Care Triamcinolone 15 (g) Discharge Instruction: Use triamcinolone 15 (g) as directed Sween Lotion (Moisturizing lotion) Discharge Instruction: Apply moisturizing lotion as directed Topical Primary Dressing MediHoney Gel, tube 1.5 (oz) Discharge Instruction: Apply to wound bed as instructed Secondary Dressing T Non-adherent Dressing, 2x3 in elfa Discharge Instruction: Apply over primary dressing as directed. Secured With Compression Wrap Kerlix Roll 4.5x3.1 (in/yd) Discharge Instruction: Apply Kerlix and Coban compression as directed. Coban Self-Adherent Wrap 4x5 (in/yd) Discharge Instruction: Apply over Kerlix as directed. [name], [name] ([id number]) [id number] Page 8 of 8 Compression Stockings Add-Ons Electronic Signature(s) Signed: 06/04/2023 11:59:38 AM By: Samuella Bruin Entered By: Samuella Bruin on 06/04/2023 10:47:06 -------------------------------------------------------------------------------- Vitals Details Patient Name: Date of Service: [name] [name] 06/04/2023 10:45 A M Medical Record Number: [id number] Patient Account Number: [id number] Date of Birth/Sex: Treating RN: 1945-12-18 (77 y.o. F) Primary Care Keevan Wolz: Dina Rich Other Clinician: Referring Roshard Rezabek: Treating Kayven Aldaco/Extender: Pincus Badder in Treatment: 18 Vital Signs Time Taken: 10:38 Temperature (F):  98.4 Height (in): 63 Pulse (bpm): 106 Weight (lbs): 115 Respiratory Rate (breaths/min): 18 Body Mass Index (BMI): 20.4 Blood Pressure (mmHg): 132/65 Reference Range: 80 - 120 mg / dl Electronic Signature(s) Signed: 06/05/2023 7:48:16 AM By: Dayton Scrape Entered By: Dayton Scrape on 06/04/2023 10:38:30

## 2023-06-05 NOTE — Progress Notes (Signed)
HALAYA, DENNE ([id number]) [id number] Page 1 of 11 Visit Report for 06/04/2023 Chief Complaint Document Details Patient Name: Date of Service: [name], [name] RMA Long. 06/04/2023 10:45 A M Medical Record Number: [id number] Patient Account Number: [id number] Date of Birth/Sex: Treating RN: 1945/12/25 (77 y.o. F) Primary Care Provider: Dina Rich Other Clinician: Referring Provider: Treating Provider/Extender: Pincus Badder in Treatment: 18 Information Obtained from: Patient Chief Complaint Patient seen for complaints of Non-Healing Wound. Electronic Signature(s) Signed: 06/05/2023 7:58:22 AM By: Duanne Guess MD FACS Entered By: Duanne Guess on 06/05/2023 07:58:21 -------------------------------------------------------------------------------- Debridement Details Patient Name: Date of Service: Elaine Hurst RMA Long. 06/04/2023 10:45 A M Medical Record Number: [id number] Patient Account Number: [id number] Date of Birth/Sex: Treating RN: 09/22/1946 (77 y.o. [name] Primary Care Provider: Dina Rich Other Clinician: Referring Provider: Treating Provider/Extender: Pincus Badder in Treatment: 18 Debridement Performed for Assessment: Wound #4 Left,Anterior Lower Leg Performed By: Physician Duanne Guess, MD Debridement Type: Debridement Severity of Tissue Pre Debridement: Fat layer exposed Level of Consciousness (Pre-procedure): Awake and Alert Pre-procedure Verification/Time Out Yes - 10:51 Taken: Start Time: 10:51 Pain Control: Lidocaine 5% topical ointment Percent of Wound Bed Debrided: 50% T Area Debrided (cm): otal 16.84 Tissue and other material debrided: Non-Viable, Slough, Slough Level: Non-Viable Tissue Debridement Description: Selective/Open Wound Instrument: Curette Bleeding: Minimum Hemostasis Achieved: Pressure Response to Treatment: Procedure was tolerated well Level of Consciousness  (Post- Awake and Alert procedure): Post Debridement Measurements of Total Wound Length: (cm) 7.8 Width: (cm) 5.5 Depth: (cm) 0.1 Volume: (cm) 3.369 Character of Wound/Ulcer Post Debridement: Improved Severity of Tissue Post Debridement: Fat layer exposed Post Procedure Diagnosis [name], [name] Long ([id number]) [id number] Page 2 of 11 Same as Pre-procedure Notes scribed for Dr. Lady Gary by Samuella Bruin, RN Electronic Signature(s) Signed: 06/04/2023 11:59:38 AM By: Samuella Bruin Signed: 06/05/2023 9:04:34 AM By: Duanne Guess MD FACS Entered By: Samuella Bruin on 06/04/2023 10:53:55 -------------------------------------------------------------------------------- HPI Details Patient Name: Date of Service: Elaine Hurst RMA Long. 06/04/2023 10:45 A M Medical Record Number: [id number] Patient Account Number: [id number] Date of Birth/Sex: Treating RN: October 01, 1946 (77 y.o. F) Primary Care Provider: Dina Rich Other Clinician: Referring Provider: Treating Provider/Extender: Pincus Badder in Treatment: 18 History of Present Illness HPI Description: ADMISSION 02/19/2022 This is a 77 year old woman with minimal pertinent medical history. She does have COPD and pulmonary hypertension, but is not diabetic and is not a smoker. About 6 months ago, she and her husband got a new beagle puppy. The puppy scratched her leg and the scratches ultimately deteriorated into ulcerations. Apparently she sought care with a dermatologist who recommended applying a one-to-one mixture of peroxide and water to the wounds followed by a thick layer of Vaseline. She continue this for some time but then saw her primary care provider who told her to discontinue the peroxide. She has not been on any antibiotics for the scratches. Today, there are 3 separate wounds on her anterior tibial surface on the left. They are tender and her leg has localized swelling. There is  yellow slough buildup in each of the wound bases. ABI in clinic today was normal at 1.16. 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 02/25/2022: Over the past week, the wounds themselves have not improved tremendously but she has noticed some stinging and the periwound skin is quite inflamed. I took a culture last week that had low levels of methicillin-resistant Staph aureus. Because it was fairly low level, I did not  prescribe an oral antibiotic. I had planned to change her to mupirocin today, however. We have been using Santyl under Hydrofera Blue with 3 layer compression. 03/04/2022: The wounds have improved quite a bit over the past week. They are less painful and red. She has been taking the prescribed doxycycline but says that it gives her a stomachache. We have been using mupirocin with Hydrofera Blue and 3 layer compression. 03/11/2022: Continued improvement of the wounds. They have contracted quite a bit and have a good surface of healthy granulation tissue present. There is some dried eschar present around all 3 of the lesions. 03/17/2022: No significant change in the wounds from last week. The surface is clean with just a little bit of eschar and slough accumulation. No odor or drainage. No concern for infection. 03/26/2022: All of her wounds are smaller this week. There is good granulation tissue on the surface of each. No slough accumulation. There is just some dry skin around the wounds but not actually involving the wounds. No concern for infection. 04/04/2022: The 2 proximal wounds have healed. The distal wound is much smaller and just has a little bit of dry eschar around the edges. 04/11/2022: Her wound is nearly healed. It is clean without concern for infection. 04/18/2022: Her wound has healed. READMISSION 01/26/2023 About 8 weeks ago, she sustained another scratch from her Beagle on her left anterior tibial surface.. She has been trying to treat the wound at home  with Vaseline. There is thick slough buildup on the wound surface and the periwound skin looks a bit macerated. No overt sign of infection. 01/29/2023: She came in for an unscheduled visit today because her wound began bleeding. It apparently bled quite profusely and her husband used an over-the- counter topical agent to get it to stop. On inspection today, he managed to achieve good hemostasis and there is no ongoing blood loss. 02/03/2023: No further issues with bleeding. There is minimal slough accumulation. Some hypertrophic granulation tissue present. 02/11/2023: She has had a lot more drainage over the past week and there has been more breakdown of the skin around her wound. The wound itself has expanded and there is quite a bit of slough on the surfaces. 02/18/2023: Her wound looks much better this week. The drainage has decreased and the periwound skin is in much improved condition. The wound is smaller and there is just 1 tiny satellite adjacent to the main wound. There is slough accumulation on the surfaces. The culture that I took last week returned with methicillin sensitive Staph aureus. She is currently taking Keflex for this. 02/25/2023: Her wound is smaller again this week with minimal slough accumulation. The periwound looks significantly improved. She has completed her oral Keflex. 03/04/2023: The wound continues to contract. The periwound is completely healed and looks like normal intact skin. There is some slough on the surface of the wound overlying good granulation tissue. [name], [name] ([id number]) [id number] Page 3 of 11 03/11/2023: The wound measured about the same size today, although visually it appears smaller. There is a little bit of slough on the surface with good granulation tissue underneath. 03/17/2023: The wound is a little bit smaller today with light slough on the wound surface. No erythema, induration, or significant drainage. 03/30/2023: The  wound is a little bit smaller again today with minimal slough and eschar accumulation. 04/06/2023: Her skin irritation from the Zetuvit dressing has gotten worse and she has had a fair amount of skin breakdown in the distribution of  the bandage. This is also caused some breakdown of the existing wound. In addition, when she was at a wedding this weekend, she fell and suffered a large skin tear to her proximal left lower leg and left knee. The fat layer is exposed. There is slough accumulation on the surfaces. 04/13/2023: All of the wounds have deteriorated. They are red and angry-looking. There is slough on all of the surfaces. 04/20/2023: There has been significant improvement since last week. The erythema and irritation have resolved. There is minimal slough on the wound surfaces along with a little bit of light periwound eschar. She continues on Keflex. 04/27/2023: Continued improvement of her wounds. All of them are smaller. The knee wound is nearly closed. There is slough and eschar on all of the wound surfaces. She has completed her course of oral antibiotics. 05/04/2023: The wounds are all smaller today with slough and eschar present on all of the surfaces. The moisture balance is leaning a bit towards the dry side, however. 05/12/2023: The wound on her knee is healed. The other skin tear at the tibial tuberosity is nearly closed with just a small open area. The lower leg wounds are about the same size. Moisture balance is better. There is slough on all of the open wound surfaces. 05/20/2023: The wounds measured a little bit smaller today. They have slough accumulation and the patient says everything is quite a bit more tender. There is more periwound erythema present today. 05/29/2023: The skin tears on her upper leg have healed. The lower wounds are all smaller with just some thin slough on the surface. Her tenderness and erythema have resolved. She completed her course of Keflex yesterday. 06/04/2023:  All of the lower leg wounds are smaller again today. There is no erythema surrounding the wounds. Light slough on the surfaces. Electronic Signature(s) Signed: 06/05/2023 7:58:55 AM By: Duanne Guess MD FACS Entered By: Duanne Guess on 06/05/2023 07:58:55 -------------------------------------------------------------------------------- Physical Exam Details Patient Name: Date of Service: Elaine Hurst RMA Long. 06/04/2023 10:45 A M Medical Record Number: [id number] Patient Account Number: [id number] Date of Birth/Sex: Treating RN: 1946-08-15 (77 y.o. F) Primary Care Provider: Dina Rich Other Clinician: Referring Provider: Treating Provider/Extender: Pincus Badder in Treatment: 18 Constitutional . Slightly tachycardic. . . no acute distress. Respiratory Normal work of breathing on room air. Notes 06/04/2023: All of the lower leg wounds are smaller again today. There is no erythema surrounding the wounds. Light slough on the surfaces. Electronic Signature(s) Signed: 06/05/2023 7:59:24 AM By: Duanne Guess MD FACS Entered By: Duanne Guess on 06/05/2023 07:59:24 -------------------------------------------------------------------------------- Physician [name] Details Patient Name: Date of Service: Elaine Hurst RMA Long. 06/04/2023 10:45 A M Medical Record Number: [id number] Patient Account Number: [id number] [name], [name] ([id number]) [id number] Page 4 of 11 Date of Birth/Sex: Treating RN: [birth year] (77 y.o. [name] Primary Care Provider: Other Clinician: Dina Rich Referring Provider: Treating Provider/Extender: Pincus Badder in Treatment: [phone] Verbal / Phone [name]: No Diagnosis Coding ICD-10 Coding Code Description [phone] Non-pressure chronic ulcer of other part of left lower leg with fat layer exposed I27.20 Pulmonary hypertension, unspecified I51.9 Heart disease, unspecified Follow-up  Appointments ppointment in 2 weeks. - Dr. Lady Gary - room 2 Return A Anesthetic (In clinic) Topical Lidocaine 5% applied to wound bed Bathing/ Shower/ Hygiene May shower and wash wound with soap and water. - with dressing changes Edema Control - Lymphedema / SCD / Other Elevate legs to the level of the heart or above for  30 minutes daily and/or when sitting for 3-4 times a day throughout the day. A void standing for long periods of time. Exercise regularly If compression wraps slide down please call wound center and speak with a nurse. Wound Treatment Wound #4 - Lower Leg Wound Laterality: Left, Anterior Cleanser: Soap and Water 1 x Per Week/30 Days Discharge Instructions: May shower and wash wound with dial antibacterial soap and water prior to dressing change. Cleanser: Vashe 5.8 (oz) 1 x Per Week/30 Days Discharge Instructions: Cleanse the wound with Vashe prior to applying a clean dressing using gauze sponges, not tissue or cotton balls. Peri-Wound Care: Triamcinolone 15 (g) 1 x Per Week/30 Days Discharge Instructions: Use triamcinolone 15 (g) as directed Peri-Wound Care: Sween Lotion (Moisturizing lotion) 1 x Per Week/30 Days Discharge Instructions: Apply moisturizing lotion as directed Prim Dressing: MediHoney Gel, tube 1.5 (oz) 1 x Per Week/30 Days ary Discharge Instructions: Apply to wound bed as instructed Secondary Dressing: T Non-adherent Dressing, 2x3 in 1 x Per Week/30 Days elfa Discharge Instructions: Apply over primary dressing as directed. Compression Wrap: Kerlix Roll 4.5x3.1 (in/yd) 1 x Per Week/30 Days Discharge Instructions: Apply Kerlix and Coban compression as directed. Compression Wrap: Coban Self-Adherent Wrap 4x5 (in/yd) 1 x Per Week/30 Days Discharge Instructions: Apply over Kerlix as directed. Patient Medications llergies: Sulfa (Sulfonamide Antibiotics), adhesive, amoxicillin, ciprofloxacin, Statins-Hmg-Coa Reductase Inhibitors A Notifications Medication  Indication Start End 06/04/2023 lidocaine DOSE topical 4 % cream - cream topical Electronic Signature(s) Signed: 06/05/2023 9:04:34 AM By: Duanne Guess MD FACS Previous Signature: 06/04/2023 11:59:38 AM Version By: Samuella Bruin Entered By: Duanne Guess on 06/05/2023 07:59:38 Crapps, [name] ([id number]) [id number] Page 5 of 11 -------------------------------------------------------------------------------- Problem List Details Patient Name: Date of Service: [name], [name] RMA Long. 06/04/2023 10:45 A M Medical Record Number: [id number] Patient Account Number: [id number] Date of Birth/Sex: Treating RN: 12-18-1945 (77 y.o. F) Primary Care Provider: Dina Rich Other Clinician: Referring Provider: Treating Provider/Extender: Pincus Badder in Treatment: 18 Active Problems ICD-10 Encounter Code Description Active Date MDM Diagnosis [phone] Non-pressure chronic ulcer of other part of left lower leg with fat layer exposed4/10/2022 No Yes I27.20 Pulmonary hypertension, unspecified 01/26/2023 No Yes I51.9 Heart disease, unspecified 01/26/2023 No Yes Inactive Problems Resolved Problems ICD-10 Code Description Active Date Resolved Date L97.122 Non-pressure chronic ulcer of left thigh with fat layer exposed 04/06/2023 04/06/2023 Electronic Signature(s) Signed: 06/05/2023 7:57:58 AM By: Duanne Guess MD FACS Entered By: Duanne Guess on 06/05/2023 07:57:58 -------------------------------------------------------------------------------- Progress Note Details Patient Name: Date of Service: Elaine Hurst RMA Long. 06/04/2023 10:45 A M Medical Record Number: [id number] Patient Account Number: [id number] Date of Birth/Sex: Treating RN: 09/15/1946 (77 y.o. F) Primary Care Provider: Dina Rich Other Clinician: Referring Provider: Treating Provider/Extender: Pincus Badder in Treatment: 18 Subjective Chief Complaint Information  obtained from Patient Patient seen for complaints of Non-Healing Wound. History of Present Illness (HPI) ADMISSION 02/19/2022 This is a 77 year old woman with minimal pertinent medical history. She does have COPD and pulmonary hypertension, but is not diabetic and is not a smoker. [name], [name] ([id number]) [id number] Page 6 of 11 About 6 months ago, she and her husband got a new beagle puppy. The puppy scratched her leg and the scratches ultimately deteriorated into ulcerations. Apparently she sought care with a dermatologist who recommended applying a one-to-one mixture of peroxide and water to the wounds followed by a thick layer of Vaseline. She continue this for some time but then saw her primary care provider who told  her to discontinue the peroxide. She has not been on any antibiotics for the scratches. Today, there are 3 separate wounds on her anterior tibial surface on the left. They are tender and her leg has localized swelling. There is yellow slough buildup in each of the wound bases. ABI in clinic today was normal at 1.16. 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 02/25/2022: Over the past week, the wounds themselves have not improved tremendously but she has noticed some stinging and the periwound skin is quite inflamed. I took a culture last week that had low levels of methicillin-resistant Staph aureus. Because it was fairly low level, I did not prescribe an oral antibiotic. I had planned to change her to mupirocin today, however. We have been using Santyl under Hydrofera Blue with 3 layer compression. 03/04/2022: The wounds have improved quite a bit over the past week. They are less painful and red. She has been taking the prescribed doxycycline but says that it gives her a stomachache. We have been using mupirocin with Hydrofera Blue and 3 layer compression. 03/11/2022: Continued improvement of the wounds. They have contracted quite  a bit and have a good surface of healthy granulation tissue present. There is some dried eschar present around all 3 of the lesions. 03/17/2022: No significant change in the wounds from last week. The surface is clean with just a little bit of eschar and slough accumulation. No odor or drainage. No concern for infection. 03/26/2022: All of her wounds are smaller this week. There is good granulation tissue on the surface of each. No slough accumulation. There is just some dry skin around the wounds but not actually involving the wounds. No concern for infection. 04/04/2022: The 2 proximal wounds have healed. The distal wound is much smaller and just has a little bit of dry eschar around the edges. 04/11/2022: Her wound is nearly healed. It is clean without concern for infection. 04/18/2022: Her wound has healed. READMISSION 01/26/2023 About 8 weeks ago, she sustained another scratch from her Beagle on her left anterior tibial surface.. She has been trying to treat the wound at home with Vaseline. There is thick slough buildup on the wound surface and the periwound skin looks a bit macerated. No overt sign of infection. 01/29/2023: She came in for an unscheduled visit today because her wound began bleeding. It apparently bled quite profusely and her husband used an over-the- counter topical agent to get it to stop. On inspection today, he managed to achieve good hemostasis and there is no ongoing blood loss. 02/03/2023: No further issues with bleeding. There is minimal slough accumulation. Some hypertrophic granulation tissue present. 02/11/2023: She has had a lot more drainage over the past week and there has been more breakdown of the skin around her wound. The wound itself has expanded and there is quite a bit of slough on the surfaces. 02/18/2023: Her wound looks much better this week. The drainage has decreased and the periwound skin is in much improved condition. The wound is smaller and there is just 1  tiny satellite adjacent to the main wound. There is slough accumulation on the surfaces. The culture that I took last week returned with methicillin sensitive Staph aureus. She is currently taking Keflex for this. 02/25/2023: Her wound is smaller again this week with minimal slough accumulation. The periwound looks significantly improved. She has completed her oral Keflex. 03/04/2023: The wound continues to contract. The periwound is completely healed and looks like normal intact skin.  There is some slough on the surface of the wound overlying good granulation tissue. 03/11/2023: The wound measured about the same size today, although visually it appears smaller. There is a little bit of slough on the surface with good granulation tissue underneath. 03/17/2023: The wound is a little bit smaller today with light slough on the wound surface. No erythema, induration, or significant drainage. 03/30/2023: The wound is a little bit smaller again today with minimal slough and eschar accumulation. 04/06/2023: Her skin irritation from the Zetuvit dressing has gotten worse and she has had a fair amount of skin breakdown in the distribution of the bandage. This is also caused some breakdown of the existing wound. In addition, when she was at a wedding this weekend, she fell and suffered a large skin tear to her proximal left lower leg and left knee. The fat layer is exposed. There is slough accumulation on the surfaces. 04/13/2023: All of the wounds have deteriorated. They are red and angry-looking. There is slough on all of the surfaces. 04/20/2023: There has been significant improvement since last week. The erythema and irritation have resolved. There is minimal slough on the wound surfaces along with a little bit of light periwound eschar. She continues on Keflex. 04/27/2023: Continued improvement of her wounds. All of them are smaller. The knee wound is nearly closed. There is slough and eschar on all of the  wound surfaces. She has completed her course of oral antibiotics. 05/04/2023: The wounds are all smaller today with slough and eschar present on all of the surfaces. The moisture balance is leaning a bit towards the dry side, however. 05/12/2023: The wound on her knee is healed. The other skin tear at the tibial tuberosity is nearly closed with just a small open area. The lower leg wounds are about the same size. Moisture balance is better. There is slough on all of the open wound surfaces. 05/20/2023: The wounds measured a little bit smaller today. They have slough accumulation and the patient says everything is quite a bit more tender. There is more periwound erythema present today. 05/29/2023: The skin tears on her upper leg have healed. The lower wounds are all smaller with just some thin slough on the surface. Her tenderness and erythema have resolved. She completed her course of Keflex yesterday. 06/04/2023: All of the lower leg wounds are smaller again today. There is no erythema surrounding the wounds. Light slough on the surfaces. Patient History Information obtained from Patient, Caregiver. Family History Unknown History. SHAWNTAL, CONGO ([id number]) [id number] Page 7 of 20 Social History Former smoker, Marital Status - Married, Alcohol Use - Never, Drug Use - No History, Caffeine Use - Rarely. Medical History Eyes Patient has history of Cataracts - bil removed Denies history of Glaucoma, Optic Neuritis Ear/Nose/Mouth/Throat Denies history of Chronic sinus problems/congestion, Middle ear problems Hematologic/Lymphatic Patient has history of Anemia Respiratory Patient has history of Chronic Obstructive Pulmonary Disease (COPD) Cardiovascular Patient has history of Coronary Artery Disease, Peripheral Venous Disease - varicose veins Endocrine Denies history of Type I Diabetes, Type II Diabetes Genitourinary Denies history of End Stage Renal  Disease Immunological Patient has history of Raynauds Denies history of Lupus Erythematosus, Scleroderma Integumentary (Skin) Denies history of History of Burn Musculoskeletal Patient has history of Rheumatoid Arthritis, Osteoarthritis Denies history of Gout Oncologic Denies history of Received Chemotherapy, Received Radiation Psychiatric Denies history of Anorexia/bulimia, Confinement Anxiety Hospitalization/Surgery History - bil cataract removal. - left shoiulder replacement. - right rotator cuff repair. - multiple lumbar  spine surgeries. - melanoma removed left arm and chest. - partial excision bone left 2nd toe. Medical A Surgical History Notes nd Hematologic/Lymphatic varicose vein of leg, mixed hyperlipidemia Respiratory dyspnea, chronic bronchitis, pulmonary hypertension, pulmonary emphysema Cardiovascular aortic calcification, tachycardia, left ventricular dysfunction, bradycardia Gastrointestinal Gerd Endocrine prediabetes, hypothyroidism Genitourinary bladder disorder Integumentary (Skin) contact dermatitis Musculoskeletal acquired plantar porokeratosis, neurogenic claudication d/t lumbar spinal stenosis, supraspinatus tendinitis, restless leg syndrome, spinal stenosis of lumbar region Neurologic TIA, insomnia Objective Constitutional Slightly tachycardic. no acute distress. Vitals Time Taken: 10:38 AM, Height: 63 in, Weight: 115 lbs, BMI: 20.4, Temperature: 98.4 F, Pulse: 106 bpm, Respiratory Rate: 18 breaths/min, Blood Pressure: 132/65 mmHg. Respiratory Normal work of breathing on room air. General Notes: 06/04/2023: All of the lower leg wounds are smaller again today. There is no erythema surrounding the wounds. Light slough on the surfaces. Integumentary (Hair, Skin) Wound #4 status is Open. Original cause of wound was Trauma. The date acquired was: 11/27/2022. The wound has been in treatment 18 weeks. The wound is located on the Left,Anterior Lower Leg. The  wound measures 7.8cm length x 5.5cm width x 0.1cm depth; 33.694cm^2 area and 3.369cm^3 volume. There is Fat Layer (Subcutaneous Tissue) exposed. There is no tunneling or undermining noted. There is a medium amount of serosanguineous drainage noted. The wound margin is distinct with the outline attached to the wound base. There is small (1-33%) red granulation within the wound bed. There is a large (67-100%) amount of necrotic tissue within the wound bed including Adherent Slough. The periwound skin appearance had no abnormalities noted for moisture. The periwound skin appearance exhibited: Scarring, Hemosiderin Staining. The periwound skin appearance did not exhibit: Callus, Crepitus, Excoriation, Induration, Rash, Atrophie Blanche, Cyanosis, Ecchymosis, Mottled, Pallor, Rubor, Erythema. Periwound temperature was noted as No Abnormality. [name], [name] ([id number]) [id number] Page 8 of 11 Assessment Active Problems ICD-10 Non-pressure chronic ulcer of other part of left lower leg with fat layer exposed Pulmonary hypertension, unspecified Heart disease, unspecified Procedures Wound #4 Pre-procedure diagnosis of Wound #4 is a Venous Leg Ulcer located on the Left,Anterior Lower Leg .Severity of Tissue Pre Debridement is: Fat layer exposed. There was a Selective/Open Wound Non-Viable Tissue Debridement with a total area of 16.84 sq cm performed by Duanne Guess, MD. With the following instrument(s): Curette to remove Non-Viable tissue/material. Material removed includes [facility] after achieving pain control using Lidocaine 5% topical ointment. No specimens were taken. A time out was conducted at 10:51, prior to the start of the procedure. A Minimum amount of bleeding was controlled with Pressure. The procedure was tolerated well. Post Debridement Measurements: 7.8cm length x 5.5cm width x 0.1cm depth; 3.369cm^3 volume. Character of Wound/Ulcer Post Debridement is improved.  Severity of Tissue Post Debridement is: Fat layer exposed. Post procedure Diagnosis Wound #4: Same as Pre-Procedure General Notes: scribed for Dr. Lady Gary by Samuella Bruin, RN. Plan Follow-up Appointments: Return Appointment in 2 weeks. - Dr. Lady Gary - room 2 Anesthetic: (In clinic) Topical Lidocaine 5% applied to wound bed Bathing/ Shower/ Hygiene: May shower and wash wound with soap and water. - with dressing changes Edema Control - Lymphedema / SCD / Other: Elevate legs to the level of the heart or above for 30 minutes daily and/or when sitting for 3-4 times a day throughout the day. Avoid standing for long periods of time. Exercise regularly If compression wraps slide down please call wound center and speak with a nurse. The following medication(s) was prescribed: lidocaine topical 4 % cream cream topical was  prescribed at facility WOUND #4: - Lower Leg Wound Laterality: Left, Anterior Cleanser: Soap and Water 1 x Per Week/30 Days Discharge Instructions: May shower and wash wound with dial antibacterial soap and water prior to dressing change. Cleanser: Vashe 5.8 (oz) 1 x Per Week/30 Days Discharge Instructions: Cleanse the wound with Vashe prior to applying a clean dressing using gauze sponges, not tissue or cotton balls. Peri-Wound Care: Triamcinolone 15 (g) 1 x Per Week/30 Days Discharge Instructions: Use triamcinolone 15 (g) as directed Peri-Wound Care: Sween Lotion (Moisturizing lotion) 1 x Per Week/30 Days Discharge Instructions: Apply moisturizing lotion as directed Prim Dressing: MediHoney Gel, tube 1.5 (oz) 1 x Per Week/30 Days ary Discharge Instructions: Apply to wound bed as instructed Secondary Dressing: T Non-adherent Dressing, 2x3 in 1 x Per Week/30 Days elfa Discharge Instructions: Apply over primary dressing as directed. Com pression Wrap: Kerlix Roll 4.5x3.1 (in/yd) 1 x Per Week/30 Days Discharge Instructions: Apply Kerlix and Coban compression as directed. Com  pression Wrap: Coban Self-Adherent Wrap 4x5 (in/yd) 1 x Per Week/30 Days Discharge Instructions: Apply over Kerlix as directed. 06/04/2023: All of the lower leg wounds are smaller again today. There is no erythema surrounding the wounds. Light slough on the surfaces. I used a curette to debride slough off of the wound surfaces. We will continue using Medihoney with Telfa, Kerlix, and Coban. Due to the financial expense of weekly visits, she and her husband have requested to come every other week, so I will see her in 2 weeks. Electronic Signature(s) Signed: 06/05/2023 8:00:25 AM By: Duanne Guess MD FACS Entered By: Duanne Guess on 06/05/2023 08:00:25 [name] ([id number]) [id number] Page 9 of 11 -------------------------------------------------------------------------------- HxROS Details Patient Name: Date of Service: [name], [name] RMA Long. 06/04/2023 10:45 A M Medical Record Number: [id number] Patient Account Number: [id number] Date of Birth/Sex: Treating RN: 21-May-1946 (77 y.o. F) Primary Care Provider: Dina Rich Other Clinician: Referring Provider: Treating Provider/Extender: Pincus Badder in Treatment: 18 Information Obtained From Patient Caregiver Eyes Medical History: Positive for: Cataracts - bil removed Negative for: Glaucoma; Optic Neuritis Ear/Nose/Mouth/Throat Medical History: Negative for: Chronic sinus problems/congestion; Middle ear problems Hematologic/Lymphatic Medical History: Positive for: Anemia Past Medical History Notes: varicose vein of leg, mixed hyperlipidemia Respiratory Medical History: Positive for: Chronic Obstructive Pulmonary Disease (COPD) Past Medical History Notes: dyspnea, chronic bronchitis, pulmonary hypertension, pulmonary emphysema Cardiovascular Medical History: Positive for: Coronary Artery Disease; Peripheral Venous Disease - varicose veins Past Medical History Notes: aortic  calcification, tachycardia, left ventricular dysfunction, bradycardia Gastrointestinal Medical History: Past Medical History Notes: Gerd Endocrine Medical History: Negative for: Type I Diabetes; Type II Diabetes Past Medical History Notes: prediabetes, hypothyroidism Genitourinary Medical History: Negative for: End Stage Renal Disease Past Medical History Notes: bladder disorder Immunological Medical History: Positive for: Raynauds Negative for: Lupus Erythematosus; Scleroderma Integumentary (Skin) Medical History: Negative for: History of Burn [name], [name] ([id number]) [id number] Page 10 of 11 Past Medical History Notes: contact dermatitis Musculoskeletal Medical History: Positive for: Rheumatoid Arthritis; Osteoarthritis Negative for: Gout Past Medical History Notes: acquired plantar porokeratosis, neurogenic claudication d/t lumbar spinal stenosis, supraspinatus tendinitis, restless leg syndrome, spinal stenosis of lumbar region Neurologic Medical History: Past Medical History Notes: TIA, insomnia Oncologic Medical History: Negative for: Received Chemotherapy; Received Radiation Psychiatric Medical History: Negative for: Anorexia/bulimia; Confinement Anxiety HBO Extended History Items Eyes: Cataracts Immunizations Pneumococcal Vaccine: Received Pneumococcal Vaccination: Yes Received Pneumococcal Vaccination On or After 60th Birthday: Yes Implantable Devices None Hospitalization / Surgery History Type of Hospitalization/Surgery bil  cataract removal left shoiulder replacement right rotator cuff repair multiple lumbar spine surgeries melanoma removed left arm and chest partial excision bone left 2nd toe Family and Social History Unknown History: Yes; Former smoker; Marital Status - Married; Alcohol Use: Never; Drug Use: No History; Caffeine Use: Rarely; Financial Concerns: No; Food, Clothing or Shelter Needs: No; Support System  Lacking: No; Transportation Concerns: No Electronic Signature(s) Signed: 06/05/2023 9:04:34 AM By: Duanne Guess MD FACS Entered By: Duanne Guess on 06/05/2023 07:59:01 -------------------------------------------------------------------------------- SuperBill Details Patient Name: Date of Service: Elaine Hurst RMA Long. 06/04/2023 Medical Record Number: [id number] Patient Account Number: [id number] Date of Birth/Sex: Treating RN: 12-Jun-1946 (77 y.o. [name] Primary Care Provider: Dina Rich Other Clinician: Referring Provider: Treating Provider/Extender: Pincus Badder in Treatment: [address] [name] ([id number]) [id number] Page 11 of 11 Diagnosis Coding ICD-10 Codes Code Description [phone] Non-pressure chronic ulcer of other part of left lower leg with fat layer exposed I27.20 Pulmonary hypertension, unspecified I51.9 Heart disease, unspecified Facility Procedures : CPT4 Code: [id number] Description: 97597 - DEBRIDE WOUND 1ST 20 SQ CM OR < ICD-10 Diagnosis Description L97.822 Non-pressure chronic ulcer of other part of left lower leg with fat layer expose Modifier: d Quantity: 1 Physician Procedures : CPT4 Code Description Modifier [id number] - WC PHYS LEVEL 3 - EST PT 25 ICD-10 Diagnosis Description L97.822 Non-pressure chronic ulcer of other part of left lower leg with fat layer exposed I27.20 Pulmonary hypertension, unspecified I51.9 Heart  disease, unspecified Quantity: 1 : [id number] 97597 - WC PHYS DEBR WO ANESTH 20 SQ CM ICD-10 Diagnosis Description L97.822 Non-pressure chronic ulcer of other part of left lower leg with fat layer exposed Quantity: 1 Electronic Signature(s) Signed: 06/05/2023 8:01:48 AM By: Duanne Guess MD FACS Previous Signature: 06/04/2023 11:59:38 AM Version By: Samuella Bruin Entered By: Duanne Guess on 06/05/2023 08:01:48

## 2023-06-11 ENCOUNTER — Ambulatory Visit (HOSPITAL_BASED_OUTPATIENT_CLINIC_OR_DEPARTMENT_OTHER): Payer: PPO | Admitting: General Surgery

## 2023-06-18 ENCOUNTER — Encounter (HOSPITAL_BASED_OUTPATIENT_CLINIC_OR_DEPARTMENT_OTHER): Payer: PPO | Admitting: General Surgery

## 2023-06-18 DIAGNOSIS — L97822 Non-pressure chronic ulcer of other part of left lower leg with fat layer exposed: Secondary | ICD-10-CM | POA: Diagnosis not present

## 2023-06-18 NOTE — Progress Notes (Signed)
CHARNEA, VALDIVIEZO ([id number]) [id number] Page 1 of 11 Visit Report for 06/18/2023 Chief Complaint Document Details Patient Name: Date of Service: [name], Elaine [name] 06/18/2023 10:15 A M Medical Record Number: [id number] Patient Account Number: [id number] Date of Birth/Sex: Treating RN: 1946/04/19 (77 y.o. F) Primary Care Provider: Dina Rich Other Clinician: Referring Provider: Treating Provider/Extender: Pincus Badder in Treatment: 20 Information Obtained from: Patient Chief Complaint Patient seen for complaints of Non-Healing Wound. Electronic Signature(s) Signed: 06/18/2023 11:24:04 AM By: Duanne Guess MD FACS Entered By: Duanne Guess on 06/18/2023 08:24:04 -------------------------------------------------------------------------------- Debridement Details Patient Name: Date of Service: [name] [name] 06/18/2023 10:15 A M Medical Record Number: [id number] Patient Account Number: [id number] Date of Birth/Sex: Treating RN: 03-30-1946 (77 y.o. [name] Primary Care Provider: Dina Rich Other Clinician: Referring Provider: Treating Provider/Extender: Pincus Badder in Treatment: 20 Debridement Performed for Assessment: Wound #4 Left,Anterior Lower Leg Performed By: Physician Duanne Guess, MD Debridement Type: Debridement Severity of Tissue Pre Debridement: Fat layer exposed Level of Consciousness (Pre-procedure): Awake and Alert Pre-procedure Verification/Time Out Yes - 10:27 Taken: Start Time: 10:28 Pain Control: Lidocaine 5% topical ointment Percent of Wound Bed Debrided: 100% T Area Debrided (cm): otal 7.46 Tissue and other material debrided: Non-Viable, Slough, Slough Level: Non-Viable Tissue Debridement Description: Selective/Open Wound Instrument: Curette Bleeding: Minimum Response to Treatment: Procedure was tolerated well Level of Consciousness (Post- Awake and  Alert procedure): Post Debridement Measurements of Total Wound Length: (cm) 5 Width: (cm) 1.9 Depth: (cm) 0.1 Volume: (cm) 0.746 Character of Wound/Ulcer Post Debridement: Requires Further Debridement Severity of Tissue Post Debridement: Fat layer exposed Post Procedure Diagnosis Same as Pre-procedure [name], LOAIZA K ([id number]) [id number] Page 2 of 11 Notes Scribed for Dr. Lady Gary by Tommie Ard, RN Electronic Signature(s) Signed: 06/18/2023 12:59:10 PM By: Duanne Guess MD FACS Signed: 06/18/2023 1:05:02 PM By: Tommie Ard RN Entered By: Tommie Ard on 06/18/2023 07:32:47 -------------------------------------------------------------------------------- HPI Details Patient Name: Date of Service: [name] [name] 06/18/2023 10:15 A M Medical Record Number: [id number] Patient Account Number: [id number] Date of Birth/Sex: Treating RN: [birth year] (77 y.o. F) Primary Care Provider: Dina Rich Other Clinician: Referring Provider: Treating Provider/Extender: Pincus Badder in Treatment: 20 History of Present Illness HPI Description: ADMISSION 02/19/2022 This is a 77 year old woman with minimal pertinent medical history. She does have COPD and pulmonary hypertension, but is not diabetic and is not a smoker. About 6 months ago, she and her husband got a new beagle puppy. The puppy scratched her leg and the scratches ultimately deteriorated into ulcerations. Apparently she sought care with a dermatologist who recommended applying a one-to-one mixture of peroxide and water to the wounds followed by a thick layer of Vaseline. She continue this for some time but then saw her primary care provider who told her to discontinue the peroxide. She has not been on any antibiotics for the scratches. Today, there are 3 separate wounds on her anterior tibial surface on the left. They are tender and her leg has localized swelling. There is yellow  slough buildup in each of the wound bases. ABI in clinic today was normal at 1.16. 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 02/25/2022: Over the past week, the wounds themselves have not improved tremendously but she has noticed some stinging and the periwound skin is quite inflamed. I took a culture last week that had low levels of methicillin-resistant Staph aureus. Because it was fairly low level, I did not  prescribe an oral antibiotic. I had planned to change her to mupirocin today, however. We have been using Santyl under Hydrofera Blue with 3 layer compression. 03/04/2022: The wounds have improved quite a bit over the past week. They are less painful and red. She has been taking the prescribed doxycycline but says that it gives her a stomachache. We have been using mupirocin with Hydrofera Blue and 3 layer compression. 03/11/2022: Continued improvement of the wounds. They have contracted quite a bit and have a good surface of healthy granulation tissue present. There is some dried eschar present around all 3 of the lesions. 03/17/2022: No significant change in the wounds from last week. The surface is clean with just a little bit of eschar and slough accumulation. No odor or drainage. No concern for infection. 03/26/2022: All of her wounds are smaller this week. There is good granulation tissue on the surface of each. No slough accumulation. There is just some dry skin around the wounds but not actually involving the wounds. No concern for infection. 04/04/2022: The 2 proximal wounds have healed. The distal wound is much smaller and just has a little bit of dry eschar around the edges. 04/11/2022: Her wound is nearly healed. It is clean without concern for infection. 04/18/2022: Her wound has healed. READMISSION 01/26/2023 About 8 weeks ago, she sustained another scratch from her Beagle on her left anterior tibial surface.. She has been trying to treat the wound at home  with Vaseline. There is thick slough buildup on the wound surface and the periwound skin looks a bit macerated. No overt sign of infection. 01/29/2023: She came in for an unscheduled visit today because her wound began bleeding. It apparently bled quite profusely and her husband used an over-the- counter topical agent to get it to stop. On inspection today, he managed to achieve good hemostasis and there is no ongoing blood loss. 02/03/2023: No further issues with bleeding. There is minimal slough accumulation. Some hypertrophic granulation tissue present. 02/11/2023: She has had a lot more drainage over the past week and there has been more breakdown of the skin around her wound. The wound itself has expanded and there is quite a bit of slough on the surfaces. 02/18/2023: Her wound looks much better this week. The drainage has decreased and the periwound skin is in much improved condition. The wound is smaller and there is just 1 tiny satellite adjacent to the main wound. There is slough accumulation on the surfaces. The culture that I took last week returned with methicillin sensitive Staph aureus. She is currently taking Keflex for this. 02/25/2023: Her wound is smaller again this week with minimal slough accumulation. The periwound looks significantly improved. She has completed her oral Keflex. 03/04/2023: The wound continues to contract. The periwound is completely healed and looks like normal intact skin. There is some slough on the surface of the wound overlying good granulation tissue. [name], [name] ([id number]) [id number] Page 3 of 11 03/11/2023: The wound measured about the same size today, although visually it appears smaller. There is a little bit of slough on the surface with good granulation tissue underneath. 03/17/2023: The wound is a little bit smaller today with light slough on the wound surface. No erythema, induration, or significant drainage. 03/30/2023: The  wound is a little bit smaller again today with minimal slough and eschar accumulation. 04/06/2023: Her skin irritation from the Zetuvit dressing has gotten worse and she has had a fair amount of skin breakdown in the distribution of  the bandage. This is also caused some breakdown of the existing wound. In addition, when she was at a wedding this weekend, she fell and suffered a large skin tear to her proximal left lower leg and left knee. The fat layer is exposed. There is slough accumulation on the surfaces. 04/13/2023: All of the wounds have deteriorated. They are red and angry-looking. There is slough on all of the surfaces. 04/20/2023: There has been significant improvement since last week. The erythema and irritation have resolved. There is minimal slough on the wound surfaces along with a little bit of light periwound eschar. She continues on Keflex. 04/27/2023: Continued improvement of her wounds. All of them are smaller. The knee wound is nearly closed. There is slough and eschar on all of the wound surfaces. She has completed her course of oral antibiotics. 05/04/2023: The wounds are all smaller today with slough and eschar present on all of the surfaces. The moisture balance is leaning a bit towards the dry side, however. 05/12/2023: The wound on her knee is healed. The other skin tear at the tibial tuberosity is nearly closed with just a small open area. The lower leg wounds are about the same size. Moisture balance is better. There is slough on all of the open wound surfaces. 05/20/2023: The wounds measured a little bit smaller today. They have slough accumulation and the patient says everything is quite a bit more tender. There is more periwound erythema present today. 05/29/2023: The skin tears on her upper leg have healed. The lower wounds are all smaller with just some thin slough on the surface. Her tenderness and erythema have resolved. She completed her course of Keflex yesterday. 06/04/2023:  All of the lower leg wounds are smaller again today. There is no erythema surrounding the wounds. Light slough on the surfaces. 06/18/2023: The lower leg wounds are stable. There is some slough on the surfaces. Both the patient and her husband are a little bit frustrated that she has not made more rapid improvement. Electronic Signature(s) Signed: 06/18/2023 11:24:52 AM By: Duanne Guess MD FACS Entered By: Duanne Guess on 06/18/2023 08:24:52 -------------------------------------------------------------------------------- Physical Exam Details Patient Name: Date of Service: [name] [name] 06/18/2023 10:15 A M Medical Record Number: [id number] Patient Account Number: [id number] Date of Birth/Sex: Treating RN: 1946/04/02 (77 y.o. F) Primary Care Provider: Dina Rich Other Clinician: Referring Provider: Treating Provider/Extender: Pincus Badder in Treatment: 20 Constitutional . . . . no acute distress. Respiratory Normal work of breathing on room air. Notes 06/18/2023: The lower leg wounds are stable. There is some slough on the surfaces. Electronic Signature(s) Signed: 06/18/2023 11:29:52 AM By: Duanne Guess MD FACS Entered By: Duanne Guess on 06/18/2023 08:29:52 Physician Orders Details -------------------------------------------------------------------------------- [name] ([id number]) [id number] Page 4 of 11 Patient Name: Date of Service: [name], [name] [name] 06/18/2023 10:15 A M Medical Record Number: [id number] Patient Account Number: [id number] Date of Birth/Sex: Treating RN: 1946-08-31 (77 y.o. [name] Primary Care Provider: Dina Rich Other Clinician: Referring Provider: Treating Provider/Extender: Pincus Badder in Treatment: 20 Verbal / Phone Orders: No Diagnosis Coding ICD-10 Coding Code Description [phone] Non-pressure chronic ulcer of other part of left lower leg with  fat layer exposed I27.20 Pulmonary hypertension, unspecified I51.9 Heart disease, unspecified Follow-up Appointments ppointment in 2 weeks. - Dr. Lady Gary - room 2 Return A Anesthetic (In clinic) Topical Lidocaine 5% applied to wound bed Bathing/ Shower/ Hygiene May shower and wash wound with soap and water. -  with dressing changes Edema Control - Lymphedema / SCD / Other Elevate legs to the level of the heart or above for 30 minutes daily and/or when sitting for 3-4 times a day throughout the day. A void standing for long periods of time. Exercise regularly If compression wraps slide down please call wound center and speak with a nurse. Wound Treatment Wound #4 - Lower Leg Wound Laterality: Left, Anterior Cleanser: Soap and Water Every Other Day/30 Days Discharge Instructions: May shower and wash wound with dial antibacterial soap and water prior to dressing change. Cleanser: Vashe 5.8 (oz) Every Other Day/30 Days Discharge Instructions: Cleanse the wound with Vashe prior to applying a clean dressing using gauze sponges, not tissue or cotton balls. Peri-Wound Care: Triamcinolone 15 (g) Every Other Day/30 Days Discharge Instructions: Use triamcinolone 15 (g) as directed Peri-Wound Care: Sween Lotion (Moisturizing lotion) Every Other Day/30 Days Discharge Instructions: Apply moisturizing lotion as directed Topical: Gentamicin Every Other Day/30 Days Discharge Instructions: As directed by physician Topical: Mupirocin Ointment Every Other Day/30 Days Discharge Instructions: Apply Mupirocin (Bactroban) as instructed Secondary Dressing: T Non-adherent Dressing, 2x3 in Every Other Day/30 Days elfa Discharge Instructions: Apply over primary dressing as directed. Compression Wrap: Kerlix Roll 4.5x3.1 (in/yd) Every Other Day/30 Days Discharge Instructions: Apply Kerlix and Coban compression as directed. Compression Wrap: Coban Self-Adherent Wrap 4x5 (in/yd) Every Other Day/30 Days Discharge  Instructions: Apply over Kerlix as directed. Patient Medications llergies: Sulfa (Sulfonamide Antibiotics), adhesive, amoxicillin, ciprofloxacin, Statins-Hmg-Coa Reductase Inhibitors A Notifications Medication Indication Start End 06/18/2023 gentamicin DOSE topical 0.1 % ointment - Apply a thin layer to wound surfaces as directed with dressing changes 06/18/2023 mupirocin DOSE topical 2 % ointment - Apply thin layer as directed to wound surfaces with dressing changes Electronic Signature(s) Signed: 06/18/2023 12:59:10 PM By: Duanne Guess MD FACS Previous Signature: 06/18/2023 11:34:45 AM Version By: Duanne Guess MD FACS [place], [name] ([id number]) [id number] Page 5 of 11 Entered By: Duanne Guess on 06/18/2023 08:39:01 -------------------------------------------------------------------------------- Problem List Details Patient Name: Date of Service: [name], [name] [name] 06/18/2023 10:15 A M Medical Record Number: [id number] Patient Account Number: [id number] Date of Birth/Sex: Treating RN: August 03, 1946 (77 y.o. F) Primary Care Provider: Dina Rich Other Clinician: Referring Provider: Treating Provider/Extender: Pincus Badder in Treatment: 20 Active Problems ICD-10 Encounter Code Description Active Date MDM Diagnosis [phone] Non-pressure chronic ulcer of other part of left lower leg with fat layer exposed4/10/2022 No Yes I27.20 Pulmonary hypertension, unspecified 01/26/2023 No Yes I51.9 Heart disease, unspecified 01/26/2023 No Yes Inactive Problems Resolved Problems ICD-10 Code Description Active Date Resolved Date L97.122 Non-pressure chronic ulcer of left thigh with fat layer exposed 04/06/2023 04/06/2023 Electronic Signature(s) Signed: 06/18/2023 11:08:38 AM By: Duanne Guess MD FACS Entered By: Duanne Guess on 06/18/2023 08:08:38 -------------------------------------------------------------------------------- Progress  Note Details Patient Name: Date of Service: [name] [name] 06/18/2023 10:15 A M Medical Record Number: [id number] Patient Account Number: [id number] Date of Birth/Sex: Treating RN: 1946-08-15 (77 y.o. F) Primary Care Provider: Dina Rich Other Clinician: Referring Provider: Treating Provider/Extender: Pincus Badder in Treatment: 20 Subjective Chief Complaint Information obtained from Patient Patient seen for complaints of Non-Healing Wound. [name], [name] ([id number]) [id number] Page 6 of 11 History of Present Illness (HPI) ADMISSION 02/19/2022 This is a 77 year old woman with minimal pertinent medical history. She does have COPD and pulmonary hypertension, but is not diabetic and is not a smoker. About 6 months ago, she and her husband got a new beagle puppy. The puppy scratched  her leg and the scratches ultimately deteriorated into ulcerations. Apparently she sought care with a dermatologist who recommended applying a one-to-one mixture of peroxide and water to the wounds followed by a thick layer of Vaseline. She continue this for some time but then saw her primary care provider who told her to discontinue the peroxide. She has not been on any antibiotics for the scratches. Today, there are 3 separate wounds on her anterior tibial surface on the left. They are tender and her leg has localized swelling. There is yellow slough buildup in each of the wound bases. ABI in clinic today was normal at 1.16. 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 02/25/2022: Over the past week, the wounds themselves have not improved tremendously but she has noticed some stinging and the periwound skin is quite inflamed. I took a culture last week that had low levels of methicillin-resistant Staph aureus. Because it was fairly low level, I did not prescribe an oral antibiotic. I had planned to change her to mupirocin today, however. We  have been using Santyl under Hydrofera Blue with 3 layer compression. 03/04/2022: The wounds have improved quite a bit over the past week. They are less painful and red. She has been taking the prescribed doxycycline but says that it gives her a stomachache. We have been using mupirocin with Hydrofera Blue and 3 layer compression. 03/11/2022: Continued improvement of the wounds. They have contracted quite a bit and have a good surface of healthy granulation tissue present. There is some dried eschar present around all 3 of the lesions. 03/17/2022: No significant change in the wounds from last week. The surface is clean with just a little bit of eschar and slough accumulation. No odor or drainage. No concern for infection. 03/26/2022: All of her wounds are smaller this week. There is good granulation tissue on the surface of each. No slough accumulation. There is just some dry skin around the wounds but not actually involving the wounds. No concern for infection. 04/04/2022: The 2 proximal wounds have healed. The distal wound is much smaller and just has a little bit of dry eschar around the edges. 04/11/2022: Her wound is nearly healed. It is clean without concern for infection. 04/18/2022: Her wound has healed. READMISSION 01/26/2023 About 8 weeks ago, she sustained another scratch from her Beagle on her left anterior tibial surface.. She has been trying to treat the wound at home with Vaseline. There is thick slough buildup on the wound surface and the periwound skin looks a bit macerated. No overt sign of infection. 01/29/2023: She came in for an unscheduled visit today because her wound began bleeding. It apparently bled quite profusely and her husband used an over-the- counter topical agent to get it to stop. On inspection today, he managed to achieve good hemostasis and there is no ongoing blood loss. 02/03/2023: No further issues with bleeding. There is minimal slough accumulation. Some hypertrophic  granulation tissue present. 02/11/2023: She has had a lot more drainage over the past week and there has been more breakdown of the skin around her wound. The wound itself has expanded and there is quite a bit of slough on the surfaces. 02/18/2023: Her wound looks much better this week. The drainage has decreased and the periwound skin is in much improved condition. The wound is smaller and there is just 1 tiny satellite adjacent to the main wound. There is slough accumulation on the surfaces. The culture that I took last week returned with  methicillin sensitive Staph aureus. She is currently taking Keflex for this. 02/25/2023: Her wound is smaller again this week with minimal slough accumulation. The periwound looks significantly improved. She has completed her oral Keflex. 03/04/2023: The wound continues to contract. The periwound is completely healed and looks like normal intact skin. There is some slough on the surface of the wound overlying good granulation tissue. 03/11/2023: The wound measured about the same size today, although visually it appears smaller. There is a little bit of slough on the surface with good granulation tissue underneath. 03/17/2023: The wound is a little bit smaller today with light slough on the wound surface. No erythema, induration, or significant drainage. 03/30/2023: The wound is a little bit smaller again today with minimal slough and eschar accumulation. 04/06/2023: Her skin irritation from the Zetuvit dressing has gotten worse and she has had a fair amount of skin breakdown in the distribution of the bandage. This is also caused some breakdown of the existing wound. In addition, when she was at a wedding this weekend, she fell and suffered a large skin tear to her proximal left lower leg and left knee. The fat layer is exposed. There is slough accumulation on the surfaces. 04/13/2023: All of the wounds have deteriorated. They are red and angry-looking. There is slough on  all of the surfaces. 04/20/2023: There has been significant improvement since last week. The erythema and irritation have resolved. There is minimal slough on the wound surfaces along with a little bit of light periwound eschar. She continues on Keflex. 04/27/2023: Continued improvement of her wounds. All of them are smaller. The knee wound is nearly closed. There is slough and eschar on all of the wound surfaces. She has completed her course of oral antibiotics. 05/04/2023: The wounds are all smaller today with slough and eschar present on all of the surfaces. The moisture balance is leaning a bit towards the dry side, however. 05/12/2023: The wound on her knee is healed. The other skin tear at the tibial tuberosity is nearly closed with just a small open area. The lower leg wounds are about the same size. Moisture balance is better. There is slough on all of the open wound surfaces. 05/20/2023: The wounds measured a little bit smaller today. They have slough accumulation and the patient says everything is quite a bit more tender. There is more periwound erythema present today. 05/29/2023: The skin tears on her upper leg have healed. The lower wounds are all smaller with just some thin slough on the surface. Her tenderness and erythema have resolved. She completed her course of Keflex yesterday. 06/04/2023: All of the lower leg wounds are smaller again today. There is no erythema surrounding the wounds. Light slough on the surfaces. 06/18/2023: The lower leg wounds are stable. There is some slough on the surfaces. Both the patient and her husband are a little bit frustrated that she has not made more rapid improvement. [name], [name] ([id number]) [id number] Page 7 of 11 Patient History Information obtained from Patient, Caregiver. Family History Unknown History. Social History Former smoker, Marital Status - Married, Alcohol Use - Never, Drug Use - No History, Caffeine Use -  Rarely. Medical History Eyes Patient has history of Cataracts - bil removed Denies history of Glaucoma, Optic Neuritis Ear/Nose/Mouth/Throat Denies history of Chronic sinus problems/congestion, Middle ear problems Hematologic/Lymphatic Patient has history of Anemia Respiratory Patient has history of Chronic Obstructive Pulmonary Disease (COPD) Cardiovascular Patient has history of Coronary Artery Disease, Peripheral Venous Disease - varicose  veins Endocrine Denies history of Type I Diabetes, Type II Diabetes Genitourinary Denies history of End Stage Renal Disease Immunological Patient has history of Raynauds Denies history of Lupus Erythematosus, Scleroderma Integumentary (Skin) Denies history of History of Burn Musculoskeletal Patient has history of Rheumatoid Arthritis, Osteoarthritis Denies history of Gout Oncologic Denies history of Received Chemotherapy, Received Radiation Psychiatric Denies history of Anorexia/bulimia, Confinement Anxiety Hospitalization/Surgery History - bil cataract removal. - left shoiulder replacement. - right rotator cuff repair. - multiple lumbar spine surgeries. - melanoma removed left arm and chest. - partial excision bone left 2nd toe. Medical A Surgical History Notes nd Hematologic/Lymphatic varicose vein of leg, mixed hyperlipidemia Respiratory dyspnea, chronic bronchitis, pulmonary hypertension, pulmonary emphysema Cardiovascular aortic calcification, tachycardia, left ventricular dysfunction, bradycardia Gastrointestinal Gerd Endocrine prediabetes, hypothyroidism Genitourinary bladder disorder Integumentary (Skin) contact dermatitis Musculoskeletal acquired plantar porokeratosis, neurogenic claudication d/t lumbar spinal stenosis, supraspinatus tendinitis, restless leg syndrome, spinal stenosis of lumbar region Neurologic TIA, insomnia Objective Constitutional no acute distress. Vitals Time Taken: 10:08 AM, Height: 63 in,  Weight: 115 lbs, BMI: 20.4, Temperature: 99.1 F, Pulse: 100 bpm, Respiratory Rate: 18 breaths/min, Blood Pressure: 129/55 mmHg. Respiratory Normal work of breathing on room air. General Notes: 06/18/2023: The lower leg wounds are stable. There is some slough on the surfaces. Integumentary (Hair, Skin) Wound #4 status is Open. Original cause of wound was Trauma. The date acquired was: 11/27/2022. The wound has been in treatment 20 weeks. The wound is located on the Left,Anterior Lower Leg. The wound measures 5cm length x 1.9cm width x 0.1cm depth; 7.461cm^2 area and 0.746cm^3 volume. There is Fat [name], [name] ([id number]) [id number] Page 8 of 11 Layer (Subcutaneous Tissue) exposed. There is a medium amount of serosanguineous drainage noted. The wound margin is distinct with the outline attached to the wound base. There is small (1-33%) red granulation within the wound bed. There is a large (67-100%) amount of necrotic tissue within the wound bed including Adherent Slough. The periwound skin appearance had no abnormalities noted for moisture. The periwound skin appearance exhibited: Scarring, Hemosiderin Staining. The periwound skin appearance did not exhibit: Callus, Crepitus, Excoriation, Induration, Rash, Atrophie Blanche, Cyanosis, Ecchymosis, Mottled, Pallor, Rubor, Erythema. Periwound temperature was noted as No Abnormality. Wound #7 status is Open. Original cause of wound was Blister. The date acquired was: 06/18/2023. The wound is located on the Plantar T Great. The wound oe measures 1.1cm length x 0.3cm width x 0.1cm depth; 0.259cm^2 area and 0.026cm^3 volume. There is no tunneling or undermining noted. There is a medium amount of drainage noted. There is large (67-100%) pink granulation within the wound bed. There is no necrotic tissue within the wound bed. The periwound skin appearance had no abnormalities noted for texture. The periwound skin appearance had no  abnormalities noted for color. The periwound skin appearance did not exhibit: Dry/Scaly, Maceration. Periwound temperature was noted as No Abnormality. Assessment Active Problems ICD-10 Non-pressure chronic ulcer of other part of left lower leg with fat layer exposed Pulmonary hypertension, unspecified Heart disease, unspecified Procedures Wound #4 Pre-procedure diagnosis of Wound #4 is a Venous Leg Ulcer located on the Left,Anterior Lower Leg .Severity of Tissue Pre Debridement is: Fat layer exposed. There was a Selective/Open Wound Non-Viable Tissue Debridement with a total area of 7.46 sq cm performed by Duanne Guess, MD. With the following instrument(s): Curette to remove Non-Viable tissue/material. Material removed includes [facility] after achieving pain control using Lidocaine 5% topical ointment. No specimens were taken. A time out was conducted at  10:27, prior to the start of the procedure. A Minimum amount of bleeding was controlled with N/A. The procedure was tolerated well. Post Debridement Measurements: 5cm length x 1.9cm width x 0.1cm depth; 0.746cm^3 volume. Character of Wound/Ulcer Post Debridement requires further debridement. Severity of Tissue Post Debridement is: Fat layer exposed. Post procedure Diagnosis Wound #4: Same as Pre-Procedure General Notes: Scribed for Dr. Lady Gary by Tommie Ard, RN. Plan Follow-up Appointments: Return Appointment in 2 weeks. - Dr. Lady Gary - room 2 Anesthetic: (In clinic) Topical Lidocaine 5% applied to wound bed Bathing/ Shower/ Hygiene: May shower and wash wound with soap and water. - with dressing changes Edema Control - Lymphedema / SCD / Other: Elevate legs to the level of the heart or above for 30 minutes daily and/or when sitting for 3-4 times a day throughout the day. Avoid standing for long periods of time. Exercise regularly If compression wraps slide down please call wound center and speak with a nurse. The following  medication(s) was prescribed: gentamicin topical 0.1 % ointment Apply a thin layer to wound surfaces as directed with dressing changes starting 06/18/2023 mupirocin topical 2 % ointment Apply thin layer as directed to wound surfaces with dressing changes starting 06/18/2023 WOUND #4: - Lower Leg Wound Laterality: Left, Anterior Cleanser: Soap and Water Every Other Day/30 Days Discharge Instructions: May shower and wash wound with dial antibacterial soap and water prior to dressing change. Cleanser: Vashe 5.8 (oz) Every Other Day/30 Days Discharge Instructions: Cleanse the wound with Vashe prior to applying a clean dressing using gauze sponges, not tissue or cotton balls. Peri-Wound Care: Triamcinolone 15 (g) Every Other Day/30 Days Discharge Instructions: Use triamcinolone 15 (g) as directed Peri-Wound Care: Sween Lotion (Moisturizing lotion) Every Other Day/30 Days Discharge Instructions: Apply moisturizing lotion as directed Topical: Gentamicin Every Other Day/30 Days Discharge Instructions: As directed by physician Topical: Mupirocin Ointment Every Other Day/30 Days Discharge Instructions: Apply Mupirocin (Bactroban) as instructed Secondary Dressing: T Non-adherent Dressing, 2x3 in Every Other Day/30 Days elfa Discharge Instructions: Apply over primary dressing as directed. Com pression Wrap: Kerlix Roll 4.5x3.1 (in/yd) Every Other Day/30 Days Discharge Instructions: Apply Kerlix and Coban compression as directed. Com pression Wrap: Coban Self-Adherent Wrap 4x5 (in/yd) Every Other Day/30 Days Discharge Instructions: Apply over Kerlix as directed. 06/18/2023: The lower leg wounds are stable. There is some slough on the surfaces. Both the patient and her husband are a little bit frustrated that she has not made more rapid improvement. [name], [name] ([id number]) [id number] Page 9 of 11 I used a curette to debride the slough from the wound surfaces. Both the patient  and her husband mentioned she seems to make her best improvement when she is on Keflex, but I am somewhat loathe to prescribe a systemic antibiotic without more concrete evidence of infection. I am going to add topical gentamicin and mupirocin and skip the Medihoney for now to see if just suppression of normal skin flora makes any difference in her healing. Continue Kerlix and Coban wrap. Per patient preference, she will follow-up in 2 weeks. Electronic Signature(s) Signed: 06/18/2023 11:39:22 AM By: Duanne Guess MD FACS Previous Signature: 06/18/2023 11:31:45 AM Version By: Duanne Guess MD FACS Entered By: Duanne Guess on 06/18/2023 08:39:22 -------------------------------------------------------------------------------- HxROS Details Patient Name: Date of Service: [name] [name] 06/18/2023 10:15 A M Medical Record Number: [id number] Patient Account Number: [id number] Date of Birth/Sex: Treating RN: [birth year] (77 y.o. F) Primary Care Provider: Dina Rich Other Clinician: Referring Provider: Treating Provider/Extender: Duanne Guess  [place] Pillar in Treatment: 20 Information Obtained From Patient Caregiver Eyes Medical History: Positive for: Cataracts - bil removed Negative for: Glaucoma; Optic Neuritis Ear/Nose/Mouth/Throat Medical History: Negative for: Chronic sinus problems/congestion; Middle ear problems Hematologic/Lymphatic Medical History: Positive for: Anemia Past Medical History Notes: varicose vein of leg, mixed hyperlipidemia Respiratory Medical History: Positive for: Chronic Obstructive Pulmonary Disease (COPD) Past Medical History Notes: dyspnea, chronic bronchitis, pulmonary hypertension, pulmonary emphysema Cardiovascular Medical History: Positive for: Coronary Artery Disease; Peripheral Venous Disease - varicose veins Past Medical History Notes: aortic calcification, tachycardia, left ventricular dysfunction,  bradycardia Gastrointestinal Medical History: Past Medical History Notes: Gerd Endocrine Medical History: Negative for: Type I Diabetes; Type II Diabetes Past Medical History Notes: prediabetes, hypothyroidism Genitourinary Medical HistoryCALII, MCMANIGLE ([id number]) [id number] Page 10 of 11 Negative for: End Stage Renal Disease Past Medical History Notes: bladder disorder Immunological Medical History: Positive for: Raynauds Negative for: Lupus Erythematosus; Scleroderma Integumentary (Skin) Medical History: Negative for: History of Burn Past Medical History Notes: contact dermatitis Musculoskeletal Medical History: Positive for: Rheumatoid Arthritis; Osteoarthritis Negative for: Gout Past Medical History Notes: acquired plantar porokeratosis, neurogenic claudication d/t lumbar spinal stenosis, supraspinatus tendinitis, restless leg syndrome, spinal stenosis of lumbar region Neurologic Medical History: Past Medical History Notes: TIA, insomnia Oncologic Medical History: Negative for: Received Chemotherapy; Received Radiation Psychiatric Medical History: Negative for: Anorexia/bulimia; Confinement Anxiety HBO Extended History Items Eyes: Cataracts Immunizations Pneumococcal Vaccine: Received Pneumococcal Vaccination: Yes Received Pneumococcal Vaccination On or After 60th Birthday: Yes Implantable Devices None Hospitalization / Surgery History Type of Hospitalization/Surgery bil cataract removal left shoiulder replacement right rotator cuff repair multiple lumbar spine surgeries melanoma removed left arm and chest partial excision bone left 2nd toe Family and Social History Unknown History: Yes; Former smoker; Marital Status - Married; Alcohol Use: Never; Drug Use: No History; Caffeine Use: Rarely; Financial Concerns: No; Food, Clothing or Shelter Needs: No; Support System Lacking: No; Transportation Concerns: No Electronic  Signature(s) Signed: 06/18/2023 12:59:10 PM By: Duanne Guess MD FACS Entered By: Duanne Guess on 06/18/2023 47:42:59 [name] ([id number]) [id number] Page 11 of 11 -------------------------------------------------------------------------------- SuperBill Details Patient Name: Date of Service: SHALU, JANKOWSKI 06/18/2023 Medical Record Number: [id number] Patient Account Number: [id number] Date of Birth/Sex: Treating RN: 08/23/1946 (77 y.o. F) Primary Care Provider: Dina Rich Other Clinician: Referring Provider: Treating Provider/Extender: Pincus Badder in Treatment: 20 Diagnosis Coding ICD-10 Codes Code Description [phone] Non-pressure chronic ulcer of other part of left lower leg with fat layer exposed I27.20 Pulmonary hypertension, unspecified I51.9 Heart disease, unspecified Facility Procedures : CPT4 Code: [id number] Description: 97597 - DEBRIDE WOUND 1ST 20 SQ CM OR < ICD-10 Diagnosis Description L97.822 Non-pressure chronic ulcer of other part of left lower leg with fat layer expose Modifier: d Quantity: 1 Physician Procedures : CPT4 Code Description Modifier [id number] - WC PHYS LEVEL 4 - EST PT 25 ICD-10 Diagnosis Description L97.822 Non-pressure chronic ulcer of other part of left lower leg with fat layer exposed I27.20 Pulmonary hypertension, unspecified I51.9 Heart  disease, unspecified Quantity: 1 : [id number] - WC PHYS DEBR WO ANESTH 20 SQ CM ICD-10 Diagnosis Description L97.822 Non-pressure chronic ulcer of other part of left lower leg with fat layer exposed Quantity: 1 Electronic Signature(s) Signed: 06/18/2023 11:39:42 AM By: Duanne Guess MD FACS Entered By: Duanne Guess on 06/18/2023 08:39:41

## 2023-06-18 NOTE — Progress Notes (Signed)
Elaine, Long ([id number]) [id number] Page 1 of 9 Visit Report for 06/18/2023 Arrival Information Details Patient Name: Date of Service: [name], Elaine [name] 06/18/2023 10:15 A M Medical Record Number: [id number] Patient Account Number: [id number] Date of Birth/Sex: Treating RN: [birth year] (77 y.o. F) Primary Care Brax Walen: Dina Rich Other Clinician: Referring Kalleigh Harbor: Treating Amirr Achord/Extender: Pincus Badder in Treatment: 20 Visit Information History Since Last Visit All ordered tests and consults were completed: No Patient Arrived: [name] Added or deleted any medications: No Arrival Time: 10:07 Any new allergies or adverse reactions: No Accompanied By: husband Had a fall or experienced change in No Transfer Assistance: None activities of daily living that may affect Patient Identification Verified: Yes risk of falls: Secondary Verification Process Completed: Yes Signs or symptoms of abuse/neglect since last visito No Patient Requires Transmission-Based Precautions: No Hospitalized since last visit: No Patient Has Alerts: Yes Implantable device outside of the clinic excluding No Patient Alerts: ABI L 1.16 02/19/22 cellular tissue based products placed in the center since last visit: Pain Present Now: No Electronic Signature(s) Signed: 06/18/2023 10:38:42 AM By: Dayton Scrape Entered By: Dayton Scrape on 06/18/2023 07:08:02 -------------------------------------------------------------------------------- Encounter Discharge Information Details Patient Name: Date of Service: [name] [name] 06/18/2023 10:15 A M Medical Record Number: [id number] Patient Account Number: [id number] Date of Birth/Sex: Treating RN: 10/12/1946 (77 y.o. [name] Primary Care Bridger Pizzi: Dina Rich Other Clinician: Referring Jewelene Mairena: Treating Ezio Wieck/Extender: Pincus Badder in Treatment: 20 Encounter Discharge Information  Items Post Procedure Vitals Discharge Condition: Stable Temperature (F): 99.1 Ambulatory Status: Ambulatory Pulse (bpm): 100 Discharge Destination: Home Respiratory Rate (breaths/min): 18 Transportation: Private Auto Blood Pressure (mmHg): 129/55 Accompanied By: spouse Schedule Follow-up Appointment: Yes Clinical Summary of Care: Electronic Signature(s) Signed: 06/18/2023 11:20:19 AM By: Tommie Ard RN Entered By: Tommie Ard on 06/18/2023 08:20:19 [name] ([id number]) [id number] Page 2 of 9 -------------------------------------------------------------------------------- Lower Extremity Assessment Details Patient Name: Date of Service: [name], [name] [name] 06/18/2023 10:15 A M Medical Record Number: [id number] Patient Account Number: [id number] Date of Birth/Sex: Treating RN: 21-Jul-1946 (77 y.o. Roselee Nova, Jamie Primary Care Luna Audia: Dina Rich Other Clinician: Referring Aamya Orellana: Treating Anamari Galeas/Extender: Pincus Badder in Treatment: 20 Edema Assessment Assessed: [name]: No] [name]: No] Edema: [Left: N] [Right: o] Calf Left: Right: Point of Measurement: 25 cm From Medial Instep 29.4 cm Ankle Left: Right: Point of Measurement: 10 cm From Medial Instep 18 cm Vascular Assessment Pulses: Dorsalis Pedis Palpable: [Left:Yes] Doppler Audible: [Left:Yes] Extremity colors, hair growth, and conditions: Extremity Color: [Left:Hyperpigmented] Hair Growth on Extremity: [Left:No] Temperature of Extremity: [Left:Warm] Capillary Refill: [Left:< 3 seconds] Blood Pressure: Brachial: [Left:129] Dorsalis Pedis: 120 Ankle: Posterior Tibial: 130 Ankle Brachial Index: [Left:1.01] Electronic Signature(s) Signed: 06/18/2023 1:05:02 PM By: Tommie Ard RN Entered By: Tommie Ard on 06/18/2023 07:42:35 -------------------------------------------------------------------------------- Multi Wound Chart Details Patient Name: Date of  Service: [name] [name] 06/18/2023 10:15 A M Medical Record Number: [id number] Patient Account Number: [id number] Date of Birth/Sex: Treating RN: Nov 24, 1945 (77 y.o. F) Primary Care Floy Angert: Dina Rich Other Clinician: Referring Shaylynne Lunt: Treating Rashan Patient/Extender: Pincus Badder in Treatment: 20 Vital Signs Height(in): 63 Pulse(bpm): 100 Weight(lbs): 115 Blood Pressure(mmHg): 129/55 Body Mass Index(BMI): 20.4 Temperature(F): 99.1 Respiratory Rate(breaths/min): 18 Cromie, Isaly K ([id number]) [id number].pdf Page 3 of 9 [4:Photos:] [N/A:N/A] Left, Anterior Lower Leg N/A N/A Wound Location: Trauma N/A N/A Wounding Event: Venous Leg Ulcer N/A N/A Primary Etiology: Cataracts, Anemia, Chronic N/A N/A Comorbid History: Obstructive Pulmonary  Disease (COPD), Coronary Artery Disease, Peripheral Venous Disease, Raynauds, Rheumatoid Arthritis, Osteoarthritis 11/27/2022 N/A N/A Date Acquired: 20 N/A N/A Weeks of Treatment: Open N/A N/A Wound Status: No N/A N/A Wound Recurrence: Yes N/A N/A Clustered Wound: 4 N/A N/A Clustered Quantity: 5x1.9x0.1 N/A N/A Measurements L x W x D (cm) 7.461 N/A N/A A (cm) : rea 0.746 N/A N/A Volume (cm) : 14.40% N/A N/A % Reduction in A rea: 14.40% N/A N/A % Reduction in Volume: Full Thickness Without Exposed N/A N/A Classification: Support Structures Medium N/A N/A Exudate A mount: Serosanguineous N/A N/A Exudate Type: red, brown N/A N/A Exudate Color: Distinct, outline attached N/A N/A Wound Margin: Small (1-33%) N/A N/A Granulation A mount: Red N/A N/A Granulation Quality: Large (67-100%) N/A N/A Necrotic A mount: Fat Layer (Subcutaneous Tissue): Yes N/A N/A Exposed Structures: Fascia: No Tendon: No Muscle: No Joint: No Bone: No Medium (34-66%) N/A N/A Epithelialization: Debridement - Selective/Open Wound N/A N/A Debridement: Pre-procedure Verification/Time Out 10:27 N/A  N/A Taken: Lidocaine 5% topical ointment N/A N/A Pain Control: Slough N/A N/A Tissue Debrided: Non-Viable Tissue N/A N/A Level: 7.46 N/A N/A Debridement A (sq cm): rea Curette N/A N/A Instrument: Minimum N/A N/A Bleeding: Procedure was tolerated well N/A N/A Debridement Treatment Response: 5x1.9x0.1 N/A N/A Post Debridement Measurements L x W x D (cm) 0.746 N/A N/A Post Debridement Volume: (cm) Scarring: Yes N/A N/A Periwound Skin Texture: Excoriation: No Induration: No Callus: No Crepitus: No Rash: No Maceration: Yes N/A N/A Periwound Skin Moisture: Dry/Scaly: No Hemosiderin Staining: Yes N/A N/A Periwound Skin Color: Atrophie Blanche: No Cyanosis: No Ecchymosis: No Erythema: No Mottled: No Pallor: No Rubor: No No Abnormality N/A N/A Temperature: Debridement N/A N/A Procedures Performed: Treatment Notes Electronic Signature(sTENASIA, TOMANEK K ([id number]) [phone].pdf Page 4 of 9 Signed: 06/18/2023 11:08:47 AM By: Duanne Guess MD FACS Entered By: Duanne Guess on 06/18/2023 08:08:47 -------------------------------------------------------------------------------- Multi-Disciplinary Care Plan Details Patient Name: Date of Service: [name] [name] 06/18/2023 10:15 A M Medical Record Number: [id number] Patient Account Number: [id number] Date of Birth/Sex: Treating RN: 1946-05-09 (77 y.o. [name] Primary Care Natalio Salois: Dina Rich Other Clinician: Referring Sharlon Pfohl: Treating Karver Fadden/Extender: Pincus Badder in Treatment: 20 Active Inactive Nutrition Nursing Diagnoses: Potential for alteratiion in Nutrition/Potential for imbalanced nutrition Goals: Patient/caregiver agrees to and verbalizes understanding of need to obtain nutritional consultation Date Initiated: 01/26/2023 Target Resolution Date: 07/24/2023 Goal Status: Active Patient/caregiver will maintain therapeutic glucose control Date  Initiated: 01/26/2023 Target Resolution Date: 07/24/2023 Goal Status: Active Interventions: Assess patient nutrition upon admission and as needed per policy Provide education on nutrition Treatment Activities: Giving encouragement to exercise : 01/26/2023 Notes: Pain, Acute or Chronic Nursing Diagnoses: Pain, acute or chronic: actual or potential Potential alteration in comfort, pain Goals: Patient will verbalize adequate pain control and receive pain control interventions during procedures as needed Date Initiated: 01/26/2023 Date Inactivated: 05/29/2023 Target Resolution Date: 05/29/2023 Goal Status: Met Patient/caregiver will verbalize comfort level met Date Initiated: 01/26/2023 Target Resolution Date: 06/26/2023 Goal Status: Active Interventions: Complete pain assessment as per visit requirements Encourage patient to take pain medications as prescribed Provide education on pain management Treatment Activities: Administer pain control measures as ordered : 01/26/2023 Notes: Electronic Signature(s) Signed: 06/18/2023 1:05:02 PM By: Tommie Ard RN Entered By: Tommie Ard on 06/18/2023 07:27:49 [name] ([id number]) [id number] Page 5 of 9 -------------------------------------------------------------------------------- Pain Assessment Details Patient Name: Date of Service: CHANIE, EGLE [name] 06/18/2023 10:15 A M Medical Record Number: [id number] Patient Account Number:  [id number] Date of Birth/Sex: Treating RN: 04-08-1946 (77 y.o. F) Primary Care Tram Wrenn: Dina Rich Other Clinician: Referring Ronzell Laban: Treating Amana Bouska/Extender: Pincus Badder in Treatment: 20 Active Problems Location of Pain Severity and Description of Pain Patient Has Paino No Site Locations Pain Management and Medication Current Pain Management: Electronic Signature(s) Signed: 06/18/2023 10:38:42 AM By: Dayton Scrape Entered By: Dayton Scrape on 06/18/2023  07:08:27 -------------------------------------------------------------------------------- Patient/Caregiver Education Details Patient Name: Date of Service: [name] [name] 8/22/2024andnbsp10:15 A M Medical Record Number: [id number] Patient Account Number: [id number] Date of Birth/Gender: Treating RN: May 03, 1946 (77 y.o. [name] Primary Care Physician: Dina Rich Other Clinician: Referring Physician: Treating Physician/Extender: Pincus Badder in Treatment: 20 Education Assessment Education Provided To: Patient Education Topics Provided Wound Debridement: Methods: Explain/Verbal Responses: Reinforcements needed, State content correctly TRU, SAINZ K ([id number]) [id number] Page 6 of 9 Wound/Skin Impairment: Methods: Explain/Verbal Responses: Reinforcements needed, State content correctly Electronic Signature(s) Signed: 06/18/2023 1:05:02 PM By: Tommie Ard RN Entered By: Tommie Ard on 06/18/2023 07:33:02 -------------------------------------------------------------------------------- Wound Assessment Details Patient Name: Date of Service: [name] [name] 06/18/2023 10:15 A M Medical Record Number: [id number] Patient Account Number: [id number] Date of Birth/Sex: Treating RN: 1946/05/31 (77 y.o. F) Primary Care Atsushi Yom: Dina Rich Other Clinician: Referring Odyssey Vasbinder: Treating Tony Friscia/Extender: Pincus Badder in Treatment: 20 Wound Status Wound Number: 4 Primary Venous Leg Ulcer Etiology: Wound Location: Left, Anterior Lower Leg Wound Open Wounding Event: Trauma Status: Date Acquired: 11/27/2022 Comorbid Cataracts, Anemia, Chronic Obstructive Pulmonary Disease Weeks Of Treatment: 20 History: (COPD), Coronary Artery Disease, Peripheral Venous Disease, Clustered Wound: Yes Raynauds, Rheumatoid Arthritis, Osteoarthritis Photos Wound Measurements Length: (cm) Width: (cm) Depth:  (cm) Clustered Quantity: Area: (cm) Volume: (cm) 5 % Reduction in Area: 14.4% 1.9 % Reduction in Volume: 14.4% 0.1 Epithelialization: Medium (34-66%) 4 7.461 0.746 Wound Description Classification: Full Thickness Without Exposed Sup Wound Margin: Distinct, outline attached Exudate Amount: Medium Exudate Type: Serosanguineous Exudate Color: red, brown port Structures Foul Odor After Cleansing: No Slough/Fibrino Yes Wound Bed Granulation Amount: Small (1-33%) Exposed Structure Granulation Quality: Red Fascia Exposed: No Necrotic Amount: Large (67-100%) Fat Layer (Subcutaneous Tissue) Exposed: Yes Necrotic Quality: Adherent Slough Tendon Exposed: No Muscle Exposed: No Joint Exposed: No Bone Exposed: No [address] PRICELLA, VOSKUIL K ([id number]) [phone].pdf Page 7 of 9 Texture Color No Abnormalities Noted: No No Abnormalities Noted: No Callus: No Atrophie Blanche: No Crepitus: No Cyanosis: No Excoriation: No Ecchymosis: No Induration: No Erythema: No Rash: No Hemosiderin Staining: Yes Scarring: Yes Mottled: No Pallor: No Moisture Rubor: No No Abnormalities Noted: Yes Temperature / Pain Temperature: No Abnormality Treatment Notes Wound #4 (Lower Leg) Wound Laterality: Left, Anterior Cleanser Soap and Water Discharge Instruction: May shower and wash wound with dial antibacterial soap and water prior to dressing change. Vashe 5.8 (oz) Discharge Instruction: Cleanse the wound with Vashe prior to applying a clean dressing using gauze sponges, not tissue or cotton balls. Peri-Wound Care Triamcinolone 15 (g) Discharge Instruction: Use triamcinolone 15 (g) as directed Sween Lotion (Moisturizing lotion) Discharge Instruction: Apply moisturizing lotion as directed Topical Gentamicin Discharge Instruction: As directed by physician Mupirocin Ointment Discharge Instruction: Apply Mupirocin (Bactroban) as instructed Primary  Dressing Secondary Dressing T Non-adherent Dressing, 2x3 in elfa Discharge Instruction: Apply over primary dressing as directed. Secured With Compression Wrap Kerlix Roll 4.5x3.1 (in/yd) Discharge Instruction: Apply Kerlix and Coban compression as directed. Coban Self-Adherent Wrap 4x5 (in/yd) Discharge Instruction: Apply over Kerlix as directed. Compression Stockings Add-Ons Electronic  Signature(s) Signed: 06/18/2023 10:38:42 AM By: Dayton Scrape Entered By: Dayton Scrape on 06/18/2023 07:14:17 -------------------------------------------------------------------------------- Wound Assessment Details Patient Name: Date of Service: KARMANN, BLAUSTEIN [name] 06/18/2023 10:15 A M Medical Record Number: [id number] Patient Account Number: [id number] Date of Birth/Sex: Treating RN: 08/04/1946 (77 y.o. [name] Primary Care Evan Osburn: Dina Rich Other Clinician: Referring Carrie Usery: Treating Shabana Armentrout/Extender: Pincus Badder in Treatment: [address], Maxwell K ([id number]) [id number] Page 8 of 9 Wound Status Wound Number: 7 Primary T be determined o Etiology: Wound Location: Plantar T Great oe Wound Open Wounding Event: Blister Status: Date Acquired: 06/18/2023 Comorbid Cataracts, Anemia, Chronic Obstructive Pulmonary Disease Weeks Of Treatment: 0 History: (COPD), Coronary Artery Disease, Peripheral Venous Disease, Clustered Wound: No Raynauds, Rheumatoid Arthritis, Osteoarthritis Wound Measurements Length: (cm) 1.1 Width: (cm) 0.3 Depth: (cm) 0.1 Area: (cm) 0.259 Volume: (cm) 0.026 % Reduction in Area: % Reduction in Volume: Epithelialization: Small (1-33%) Tunneling: No Undermining: No Wound Description Classification: Full Thickness Without Exposed Suppor Exudate Amount: Medium t Structures Foul Odor After Cleansing: No Slough/Fibrino No Wound Bed Granulation Amount: Large (67-100%) Exposed Structure Granulation Quality:  Pink Fascia Exposed: No Necrotic Amount: None Present (0%) Fat Layer (Subcutaneous Tissue) Exposed: No Tendon Exposed: No Muscle Exposed: No Joint Exposed: No Bone Exposed: No Periwound Skin Texture Texture Color No Abnormalities Noted: Yes No Abnormalities Noted: Yes Moisture Temperature / Pain No Abnormalities Noted: No Temperature: No Abnormality Dry / Scaly: No Maceration: No Treatment Notes Wound #7 (Toe Great) Wound Laterality: Plantar Cleanser Peri-Wound Care Topical Primary Dressing Secondary Dressing Secured With Compression Wrap Compression Stockings Add-Ons Electronic Signature(s) Signed: 06/18/2023 11:09:17 AM By: Tommie Ard RN Entered By: Tommie Ard on 06/18/2023 08:09:17 -------------------------------------------------------------------------------- Vitals Details Patient Name: Date of Service: [name] [name] 06/18/2023 10:15 A M Medical Record Number: [id number] Patient Account Number: [id number] Date of Birth/Sex: Treating RN: June 22, 1946 (77 y.o. Ellivia Pommier, Bryah K ([id number]) [id number] Page 9 of 9 Primary Care Axil Copeman: Dina Rich Other Clinician: Referring Marlicia Sroka: Treating Embry Manrique/Extender: Pincus Badder in Treatment: 20 Vital Signs Time Taken: 10:08 Temperature (F): 99.1 Height (in): 63 Pulse (bpm): 100 Weight (lbs): 115 Respiratory Rate (breaths/min): 18 Body Mass Index (BMI): 20.4 Blood Pressure (mmHg): 129/55 Reference Range: 80 - 120 mg / dl Electronic Signature(s) Signed: 06/18/2023 10:38:42 AM By: Dayton Scrape Entered By: Dayton Scrape on 06/18/2023 07:08:20

## 2023-07-02 ENCOUNTER — Encounter (HOSPITAL_BASED_OUTPATIENT_CLINIC_OR_DEPARTMENT_OTHER): Payer: PPO | Attending: General Surgery | Admitting: General Surgery

## 2023-07-02 DIAGNOSIS — E782 Mixed hyperlipidemia: Secondary | ICD-10-CM | POA: Diagnosis not present

## 2023-07-02 DIAGNOSIS — Z87891 Personal history of nicotine dependence: Secondary | ICD-10-CM | POA: Diagnosis not present

## 2023-07-02 DIAGNOSIS — I272 Pulmonary hypertension, unspecified: Secondary | ICD-10-CM | POA: Insufficient documentation

## 2023-07-02 DIAGNOSIS — Z96612 Presence of left artificial shoulder joint: Secondary | ICD-10-CM | POA: Diagnosis not present

## 2023-07-02 DIAGNOSIS — I519 Heart disease, unspecified: Secondary | ICD-10-CM | POA: Diagnosis not present

## 2023-07-02 DIAGNOSIS — Z8673 Personal history of transient ischemic attack (TIA), and cerebral infarction without residual deficits: Secondary | ICD-10-CM | POA: Insufficient documentation

## 2023-07-02 DIAGNOSIS — I2510 Atherosclerotic heart disease of native coronary artery without angina pectoris: Secondary | ICD-10-CM | POA: Insufficient documentation

## 2023-07-02 DIAGNOSIS — L97822 Non-pressure chronic ulcer of other part of left lower leg with fat layer exposed: Secondary | ICD-10-CM | POA: Diagnosis present

## 2023-07-02 DIAGNOSIS — J449 Chronic obstructive pulmonary disease, unspecified: Secondary | ICD-10-CM | POA: Diagnosis not present

## 2023-07-02 DIAGNOSIS — L97522 Non-pressure chronic ulcer of other part of left foot with fat layer exposed: Secondary | ICD-10-CM | POA: Insufficient documentation

## 2023-07-02 NOTE — Progress Notes (Addendum)
[name] ([id number]) [id number] Page 1 of 13 Visit Report for 07/02/2023 Chief Complaint Document Details Patient Name: Date of Service: [name], [name] RMA Long. 07/02/2023 11:30 A M Medical Record Number: [id number] Patient Account Number: [id number] Date of Birth/Sex: Treating RN: 08-21-1946 (77 y.o. F) Primary Care Provider: Dina Rich Other Clinician: Referring Provider: Treating Provider/Extender: Pincus Badder in Treatment: 22 Information Obtained from: Patient Chief Complaint Patient seen for complaints of Non-Healing Wound. Electronic Signature(s) Signed: 07/02/2023 12:12:26 PM By: Duanne Guess MD FACS Entered By: Duanne Guess on 07/02/2023 09:12:26 -------------------------------------------------------------------------------- Debridement Details Patient Name: Date of Service: [name] RMA Long. 07/02/2023 11:30 A M Medical Record Number: [id number] Patient Account Number: [id number] Date of Birth/Sex: Treating RN: November 12, 1945 (77 y.o. [name] Primary Care Provider: Dina Rich Other Clinician: Referring Provider: Treating Provider/Extender: Pincus Badder in Treatment: 22 Debridement Performed for Assessment: Wound #4 Left,Anterior Lower Leg Performed By: Physician Duanne Guess, MD Debridement Type: Debridement Severity of Tissue Pre Debridement: Fat layer exposed Level of Consciousness (Pre-procedure): Awake and Alert Pre-procedure Verification/Time Out Yes - 12:10 Taken: Start Time: 12:10 Pain Control: Lidocaine 4% T opical Solution Percent of Wound Bed Debrided: 100% T Area Debrided (cm): otal 6.83 Tissue and other material debrided: Non-Viable, Slough, Slough Level: Non-Viable Tissue Debridement Description: Selective/Open Wound Instrument: Curette Bleeding: Minimum Hemostasis Achieved: Pressure Response to Treatment: Procedure was tolerated well Level of Consciousness  (Post- Awake and Alert procedure): Post Debridement Measurements of Total Wound Length: (cm) 3 Width: (cm) 2.9 Depth: (cm) 0.1 Volume: (cm) 0.683 Character of Wound/Ulcer Post Debridement: Requires Further Debridement Severity of Tissue Post Debridement: Fat layer exposed Post Procedure Diagnosis [name] ([id number]) [id number] Page 2 of 13 Same as Pre-procedure Notes Scribed for Dr. Lady Gary by Tommie Ard, RN Electronic Signature(s) Signed: 07/02/2023 12:20:22 PM By: Tommie Ard RN Signed: 07/02/2023 12:27:20 PM By: Duanne Guess MD FACS Entered By: Tommie Ard on 07/02/2023 09:20:22 -------------------------------------------------------------------------------- Debridement Details Patient Name: Date of Service: [name] RMA Long. 07/02/2023 11:30 A M Medical Record Number: [id number] Patient Account Number: [id number] Date of Birth/Sex: Treating RN: December 31, 1945 (77 y.o. [name] Primary Care Provider: Dina Rich Other Clinician: Referring Provider: Treating Provider/Extender: Pincus Badder in Treatment: 22 Debridement Performed for Assessment: Wound #7 Plantar T Great oe Performed By: Physician Duanne Guess, MD Debridement Type: Debridement Level of Consciousness (Pre-procedure): Awake and Alert Pre-procedure Verification/Time Out Yes - 12:10 Taken: Start Time: 12:10 Pain Control: Lidocaine 4% T opical Solution Percent of Wound Bed Debrided: 100% T Area Debrided (cm): otal 0.12 Tissue and other material debrided: Non-Viable, Slough, Slough Level: Non-Viable Tissue Debridement Description: Selective/Open Wound Instrument: Curette Bleeding: Minimum Hemostasis Achieved: Pressure Response to Treatment: Procedure was tolerated well Level of Consciousness (Post- Awake and Alert procedure): Post Debridement Measurements of Total Wound Length: (cm) 0.5 Width: (cm) 0.3 Depth: (cm) 0.1 Volume: (cm)  0.012 Character of Wound/Ulcer Post Debridement: Requires Further Debridement Post Procedure Diagnosis Same as Pre-procedure Notes Scribed for Dr. Lady Gary by Tommie Ard, RN Electronic Signature(s) Signed: 07/02/2023 12:21:00 PM By: Tommie Ard RN Signed: 07/02/2023 12:27:20 PM By: Duanne Guess MD FACS Entered By: Tommie Ard on 07/02/2023 09:21:00 HPI Details -------------------------------------------------------------------------------- [name] ([id number]) [id number] Page 3 of 13 Patient Name: Date of Service: [name], [name] RMA Long. 07/02/2023 11:30 A M Medical Record Number: [id number] Patient Account Number: [id number] Date of Birth/Sex: Treating RN: [birth year] (77 y.o. F) Primary Care Provider: Dina Rich Other Clinician: Referring Provider:  Treating Provider/Extender: Pincus Badder in Treatment: 22 History of Present Illness HPI Description: ADMISSION 02/19/2022 This is a 77 year old woman with minimal pertinent medical history. She does have COPD and pulmonary hypertension, but is not diabetic and is not a smoker. About 6 months ago, she and her husband got a new beagle puppy. The puppy scratched her leg and the scratches ultimately deteriorated into ulcerations. Apparently she sought care with a dermatologist who recommended applying a one-to-one mixture of peroxide and water to the wounds followed by a thick layer of Vaseline. She continue this for some time but then saw her primary care provider who told her to discontinue the peroxide. She has not been on any antibiotics for the scratches. Today, there are 3 separate wounds on her anterior tibial surface on the left. They are tender and her leg has localized swelling. There is yellow slough buildup in each of the wound bases. ABI in clinic today was normal at 1.16. 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 02/25/2022: Over the past week, the  wounds themselves have not improved tremendously but she has noticed some stinging and the periwound skin is quite inflamed. I took a culture last week that had low levels of methicillin-resistant Staph aureus. Because it was fairly low level, I did not prescribe an oral antibiotic. I had planned to change her to mupirocin today, however. We have been using Santyl under Hydrofera Blue with 3 layer compression. 03/04/2022: The wounds have improved quite a bit over the past week. They are less painful and red. She has been taking the prescribed doxycycline but says that it gives her a stomachache. We have been using mupirocin with Hydrofera Blue and 3 layer compression. 03/11/2022: Continued improvement of the wounds. They have contracted quite a bit and have a good surface of healthy granulation tissue present. There is some dried eschar present around all 3 of the lesions. 03/17/2022: No significant change in the wounds from last week. The surface is clean with just a little bit of eschar and slough accumulation. No odor or drainage. No concern for infection. 03/26/2022: All of her wounds are smaller this week. There is good granulation tissue on the surface of each. No slough accumulation. There is just some dry skin around the wounds but not actually involving the wounds. No concern for infection. 04/04/2022: The 2 proximal wounds have healed. The distal wound is much smaller and just has a little bit of dry eschar around the edges. 04/11/2022: Her wound is nearly healed. It is clean without concern for infection. 04/18/2022: Her wound has healed. READMISSION 01/26/2023 About 8 weeks ago, she sustained another scratch from her Beagle on her left anterior tibial surface.. She has been trying to treat the wound at home with Vaseline. There is thick slough buildup on the wound surface and the periwound skin looks a bit macerated. No overt sign of infection. 01/29/2023: She came in for an unscheduled visit today  because her wound began bleeding. It apparently bled quite profusely and her husband used an over-the- counter topical agent to get it to stop. On inspection today, he managed to achieve good hemostasis and there is no ongoing blood loss. 02/03/2023: No further issues with bleeding. There is minimal slough accumulation. Some hypertrophic granulation tissue present. 02/11/2023: She has had a lot more drainage over the past week and there has been more breakdown of the skin around her wound. The wound itself has expanded and there is quite  a bit of slough on the surfaces. 02/18/2023: Her wound looks much better this week. The drainage has decreased and the periwound skin is in much improved condition. The wound is smaller and there is just 1 tiny satellite adjacent to the main wound. There is slough accumulation on the surfaces. The culture that I took last week returned with methicillin sensitive Staph aureus. She is currently taking Keflex for this. 02/25/2023: Her wound is smaller again this week with minimal slough accumulation. The periwound looks significantly improved. She has completed her oral Keflex. 03/04/2023: The wound continues to contract. The periwound is completely healed and looks like normal intact skin. There is some slough on the surface of the wound overlying good granulation tissue. 03/11/2023: The wound measured about the same size today, although visually it appears smaller. There is a little bit of slough on the surface with good granulation tissue underneath. 03/17/2023: The wound is a little bit smaller today with light slough on the wound surface. No erythema, induration, or significant drainage. 03/30/2023: The wound is a little bit smaller again today with minimal slough and eschar accumulation. 04/06/2023: Her skin irritation from the Zetuvit dressing has gotten worse and she has had a fair amount of skin breakdown in the distribution of the bandage. This is also caused some  breakdown of the existing wound. In addition, when she was at a wedding this weekend, she fell and suffered a large skin tear to her proximal left lower leg and left knee. The fat layer is exposed. There is slough accumulation on the surfaces. 04/13/2023: All of the wounds have deteriorated. They are red and angry-looking. There is slough on all of the surfaces. 04/20/2023: There has been significant improvement since last week. The erythema and irritation have resolved. There is minimal slough on the wound surfaces along with a little bit of light periwound eschar. She continues on Keflex. 04/27/2023: Continued improvement of her wounds. All of them are smaller. The knee wound is nearly closed. There is slough and eschar on all of the wound surfaces. She has completed her course of oral antibiotics. 05/04/2023: The wounds are all smaller today with slough and eschar present on all of the surfaces. The moisture balance is leaning a bit towards the dry side, however. 05/12/2023: The wound on her knee is healed. The other skin tear at the tibial tuberosity is nearly closed with just a small open area. The lower leg wounds are about the same size. Moisture balance is better. There is slough on all of the open wound surfaces. [name], [name] ([id number]) [id number] Page 4 of 13 05/20/2023: The wounds measured a little bit smaller today. They have slough accumulation and the patient says everything is quite a bit more tender. There is more periwound erythema present today. 05/29/2023: The skin tears on her upper leg have healed. The lower wounds are all smaller with just some thin slough on the surface. Her tenderness and erythema have resolved. She completed her course of Keflex yesterday. 06/04/2023: All of the lower leg wounds are smaller again today. There is no erythema surrounding the wounds. Light slough on the surfaces. 06/18/2023: The lower leg wounds are stable. There is some  slough on the surfaces. Both the patient and her husband are a little bit frustrated that she has not made more rapid improvement. 07/02/2023: Her wounds look better this week. The periwound erythema and irritation that have typically been present are not there today. There is slough on the wound surfaces.  The small wound on the plantar surface of her great toe has contracted, as well. Very thin biofilm on the surface here. Electronic Signature(s) Signed: 07/02/2023 12:13:31 PM By: Duanne Guess MD FACS Entered By: Duanne Guess on 07/02/2023 09:13:31 -------------------------------------------------------------------------------- Physical Exam Details Patient Name: Date of Service: [name] RMA Long. 07/02/2023 11:30 A M Medical Record Number: [id number] Patient Account Number: [id number] Date of Birth/Sex: Treating RN: 08-24-1946 (77 y.o. F) Primary Care Provider: Dina Rich Other Clinician: Referring Provider: Treating Provider/Extender: Pincus Badder in Treatment: 22 Constitutional Slightly hypertensive. Slightly tachycardic. . . no acute distress. Respiratory Normal work of breathing on room air. Notes 07/02/2023: Her wounds look better this week. The periwound erythema and irritation that have typically been present are not there today. There is slough on the wound surfaces. The small wound on the plantar surface of her great toe has contracted, as well. Very thin biofilm on the surface here. Electronic Signature(s) Signed: 07/02/2023 12:15:27 PM By: Duanne Guess MD FACS Entered By: Duanne Guess on 07/02/2023 09:15:27 -------------------------------------------------------------------------------- Physician Orders Details Patient Name: Date of Service: [name] RMA Long. 07/02/2023 11:30 A M Medical Record Number: [id number] Patient Account Number: [id number] Date of Birth/Sex: Treating RN: 31-Dec-1945 (77 y.o. [name] Primary Care Provider: Dina Rich Other Clinician: Referring Provider: Treating Provider/Extender: Pincus Badder in Treatment: 22 Verbal / Phone Orders: No Diagnosis Coding ICD-10 Coding Code Description [phone] Non-pressure chronic ulcer of other part of left lower leg with fat layer exposed L97.522 Non-pressure chronic ulcer of other part of left foot with fat layer exposed I27.20 Pulmonary hypertension, unspecified I51.9 Heart disease, unspecified Derksen, Wanda Long ([id number]) [phone].pdf Page 5 of 13 Follow-up Appointments Return appointment in 3 weeks. Anesthetic (In clinic) Topical Lidocaine 5% applied to wound bed Bathing/ Shower/ Hygiene May shower and wash wound with soap and water. - with dressing changes Edema Control - Lymphedema / SCD / Other Elevate legs to the level of the heart or above for 30 minutes daily and/or when sitting for 3-4 times a day throughout the day. A void standing for long periods of time. Exercise regularly If compression wraps slide down please call wound center and speak with a nurse. Wound Treatment Wound #4 - Lower Leg Wound Laterality: Left, Anterior Cleanser: Soap and Water Every Other Day/30 Days Discharge Instructions: May shower and wash wound with dial antibacterial soap and water prior to dressing change. Cleanser: Vashe 5.8 (oz) Every Other Day/30 Days Discharge Instructions: Cleanse the wound with Vashe prior to applying a clean dressing using gauze sponges, not tissue or cotton balls. Peri-Wound Care: Triamcinolone 15 (g) Every Other Day/30 Days Discharge Instructions: Use triamcinolone 15 (g) as directed Peri-Wound Care: Sween Lotion (Moisturizing lotion) Every Other Day/30 Days Discharge Instructions: Apply moisturizing lotion as directed Topical: Gentamicin Every Other Day/30 Days Discharge Instructions: As directed by physician Topical: Mupirocin Ointment Every Other Day/30 Days Discharge Instructions:  Apply Mupirocin (Bactroban) as instructed Secondary Dressing: T Non-adherent Dressing, 2x3 in Every Other Day/30 Days elfa Discharge Instructions: Apply over primary dressing as directed. Compression Wrap: Kerlix Roll 4.5x3.1 (in/yd) Every Other Day/30 Days Discharge Instructions: Apply Kerlix and Coban compression as directed. Compression Wrap: Coban Self-Adherent Wrap 4x5 (in/yd) Every Other Day/30 Days Discharge Instructions: Apply over Kerlix as directed. Wound #7 - T Great oe Wound Laterality: Plantar Cleanser: Soap and Water Every Other Day/30 Days Discharge Instructions: May shower and wash wound with dial antibacterial soap and water prior to dressing change.  Cleanser: Vashe 5.8 (oz) Every Other Day/30 Days Discharge Instructions: Cleanse the wound with Vashe prior to applying a clean dressing using gauze sponges, not tissue or cotton balls. Topical: Gentamicin Every Other Day/30 Days Discharge Instructions: As directed by physician Topical: Mupirocin Ointment Every Other Day/30 Days Discharge Instructions: Apply Mupirocin (Bactroban) as instructed Secondary Dressing: T Non-adherent Dressing, 2x3 in Every Other Day/30 Days elfa Discharge Instructions: Apply over primary dressing as directed. Secured With: Insurance underwriter, Sterile 2x75 (in/in) Every Other Day/30 Days Discharge Instructions: Secure with stretch gauze as directed. Secured With: 58M [employer] Scientist, research (life sciences) Surgical T 2x10 (in/yd) Every Other Day/30 Days ape Discharge Instructions: Secure with tape as directed. Electronic Signature(s) Signed: 07/02/2023 12:27:20 PM By: Duanne Guess MD FACS Signed: 07/02/2023 12:40:06 PM By: Tommie Ard RN Previous Signature: 07/02/2023 12:15:14 PM Version By: Tommie Ard RN Entered By: Tommie Ard on 07/02/2023 09:22:06 [name] ([id number]) [id number] Page 6 of  13 -------------------------------------------------------------------------------- Problem List Details Patient Name: Date of Service: [name], [name] RMA Long. 07/02/2023 11:30 A M Medical Record Number: [id number] Patient Account Number: [id number] Date of Birth/Sex: Treating RN: 06-Jan-1946 (77 y.o. F) Primary Care Provider: Dina Rich Other Clinician: Referring Provider: Treating Provider/Extender: Pincus Badder in Treatment: 22 Active Problems ICD-10 Encounter Code Description Active Date MDM Diagnosis [phone] Non-pressure chronic ulcer of other part of left lower leg with fat layer exposed4/10/2022 No Yes L97.522 Non-pressure chronic ulcer of other part of left foot with fat layer exposed 07/02/2023 No Yes I27.20 Pulmonary hypertension, unspecified 01/26/2023 No Yes I51.9 Heart disease, unspecified 01/26/2023 No Yes Inactive Problems Resolved Problems ICD-10 Code Description Active Date Resolved Date L97.122 Non-pressure chronic ulcer of left thigh with fat layer exposed 04/06/2023 04/06/2023 Electronic Signature(s) Signed: 07/02/2023 12:11:57 PM By: Duanne Guess MD FACS Entered By: Duanne Guess on 07/02/2023 09:11:57 -------------------------------------------------------------------------------- Progress Note Details Patient Name: Date of Service: [name] RMA Long. 07/02/2023 11:30 A M Medical Record Number: [id number] Patient Account Number: [id number] Date of Birth/Sex: Treating RN: [birth year] (77 y.o. F) Primary Care Provider: Dina Rich Other Clinician: Referring Provider: Treating Provider/Extender: Pincus Badder in Treatment: 22 Subjective Chief Complaint Information obtained from Patient Patient seen for complaints of Non-Healing Wound. ZAKARIA, PLISKA ([id number]) [id number] Page 7 of 13 History of Present Illness (HPI) ADMISSION 02/19/2022 This is a 77 year old woman with minimal pertinent medical  history. She does have COPD and pulmonary hypertension, but is not diabetic and is not a smoker. About 6 months ago, she and her husband got a new beagle puppy. The puppy scratched her leg and the scratches ultimately deteriorated into ulcerations. Apparently she sought care with a dermatologist who recommended applying a one-to-one mixture of peroxide and water to the wounds followed by a thick layer of Vaseline. She continue this for some time but then saw her primary care provider who told her to discontinue the peroxide. She has not been on any antibiotics for the scratches. Today, there are 3 separate wounds on her anterior tibial surface on the left. They are tender and her leg has localized swelling. There is yellow slough buildup in each of the wound bases. ABI in clinic today was normal at 1.16. 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 02/25/2022: Over the past week, the wounds themselves have not improved tremendously but she has noticed some stinging and the periwound skin is quite inflamed. I took a culture last week that had low levels of methicillin-resistant Staph aureus.  Because it was fairly low level, I did not prescribe an oral antibiotic. I had planned to change her to mupirocin today, however. We have been using Santyl under Hydrofera Blue with 3 layer compression. 03/04/2022: The wounds have improved quite a bit over the past week. They are less painful and red. She has been taking the prescribed doxycycline but says that it gives her a stomachache. We have been using mupirocin with Hydrofera Blue and 3 layer compression. 03/11/2022: Continued improvement of the wounds. They have contracted quite a bit and have a good surface of healthy granulation tissue present. There is some dried eschar present around all 3 of the lesions. 03/17/2022: No significant change in the wounds from last week. The surface is clean with just a little bit of eschar and slough  accumulation. No odor or drainage. No concern for infection. 03/26/2022: All of her wounds are smaller this week. There is good granulation tissue on the surface of each. No slough accumulation. There is just some dry skin around the wounds but not actually involving the wounds. No concern for infection. 04/04/2022: The 2 proximal wounds have healed. The distal wound is much smaller and just has a little bit of dry eschar around the edges. 04/11/2022: Her wound is nearly healed. It is clean without concern for infection. 04/18/2022: Her wound has healed. READMISSION 01/26/2023 About 8 weeks ago, she sustained another scratch from her Beagle on her left anterior tibial surface.. She has been trying to treat the wound at home with Vaseline. There is thick slough buildup on the wound surface and the periwound skin looks a bit macerated. No overt sign of infection. 01/29/2023: She came in for an unscheduled visit today because her wound began bleeding. It apparently bled quite profusely and her husband used an over-the- counter topical agent to get it to stop. On inspection today, he managed to achieve good hemostasis and there is no ongoing blood loss. 02/03/2023: No further issues with bleeding. There is minimal slough accumulation. Some hypertrophic granulation tissue present. 02/11/2023: She has had a lot more drainage over the past week and there has been more breakdown of the skin around her wound. The wound itself has expanded and there is quite a bit of slough on the surfaces. 02/18/2023: Her wound looks much better this week. The drainage has decreased and the periwound skin is in much improved condition. The wound is smaller and there is just 1 tiny satellite adjacent to the main wound. There is slough accumulation on the surfaces. The culture that I took last week returned with methicillin sensitive Staph aureus. She is currently taking Keflex for this. 02/25/2023: Her wound is smaller again this week  with minimal slough accumulation. The periwound looks significantly improved. She has completed her oral Keflex. 03/04/2023: The wound continues to contract. The periwound is completely healed and looks like normal intact skin. There is some slough on the surface of the wound overlying good granulation tissue. 03/11/2023: The wound measured about the same size today, although visually it appears smaller. There is a little bit of slough on the surface with good granulation tissue underneath. 03/17/2023: The wound is a little bit smaller today with light slough on the wound surface. No erythema, induration, or significant drainage. 03/30/2023: The wound is a little bit smaller again today with minimal slough and eschar accumulation. 04/06/2023: Her skin irritation from the Zetuvit dressing has gotten worse and she has had a fair amount of skin breakdown in the distribution of  the bandage. This is also caused some breakdown of the existing wound. In addition, when she was at a wedding this weekend, she fell and suffered a large skin tear to her proximal left lower leg and left knee. The fat layer is exposed. There is slough accumulation on the surfaces. 04/13/2023: All of the wounds have deteriorated. They are red and angry-looking. There is slough on all of the surfaces. 04/20/2023: There has been significant improvement since last week. The erythema and irritation have resolved. There is minimal slough on the wound surfaces along with a little bit of light periwound eschar. She continues on Keflex. 04/27/2023: Continued improvement of her wounds. All of them are smaller. The knee wound is nearly closed. There is slough and eschar on all of the wound surfaces. She has completed her course of oral antibiotics. 05/04/2023: The wounds are all smaller today with slough and eschar present on all of the surfaces. The moisture balance is leaning a bit towards the dry side, however. 05/12/2023: The wound on her knee is  healed. The other skin tear at the tibial tuberosity is nearly closed with just a small open area. The lower leg wounds are about the same size. Moisture balance is better. There is slough on all of the open wound surfaces. 05/20/2023: The wounds measured a little bit smaller today. They have slough accumulation and the patient says everything is quite a bit more tender. There is more periwound erythema present today. 05/29/2023: The skin tears on her upper leg have healed. The lower wounds are all smaller with just some thin slough on the surface. Her tenderness and erythema have resolved. She completed her course of Keflex yesterday. 06/04/2023: All of the lower leg wounds are smaller again today. There is no erythema surrounding the wounds. Light slough on the surfaces. 06/18/2023: The lower leg wounds are stable. There is some slough on the surfaces. Both the patient and her husband are a little bit frustrated that she has not made more rapid improvement. [name], [name] ([id number]) [id number] Page 8 of 13 07/02/2023: Her wounds look better this week. The periwound erythema and irritation that have typically been present are not there today. There is slough on the wound surfaces. The small wound on the plantar surface of her great toe has contracted, as well. Very thin biofilm on the surface here. Patient History Information obtained from Patient, Caregiver. Family History Unknown History. Social History Former smoker, Marital Status - Married, Alcohol Use - Never, Drug Use - No History, Caffeine Use - Rarely. Medical History Eyes Patient has history of Cataracts - bil removed Denies history of Glaucoma, Optic Neuritis Ear/Nose/Mouth/Throat Denies history of Chronic sinus problems/congestion, Middle ear problems Hematologic/Lymphatic Patient has history of Anemia Respiratory Patient has history of Chronic Obstructive Pulmonary Disease (COPD) Cardiovascular Patient  has history of Coronary Artery Disease, Peripheral Venous Disease - varicose veins Endocrine Denies history of Type I Diabetes, Type II Diabetes Genitourinary Denies history of End Stage Renal Disease Immunological Patient has history of Raynauds Denies history of Lupus Erythematosus, Scleroderma Integumentary (Skin) Denies history of History of Burn Musculoskeletal Patient has history of Rheumatoid Arthritis, Osteoarthritis Denies history of Gout Oncologic Denies history of Received Chemotherapy, Received Radiation Psychiatric Denies history of Anorexia/bulimia, Confinement Anxiety Hospitalization/Surgery History - bil cataract removal. - left shoiulder replacement. - right rotator cuff repair. - multiple lumbar spine surgeries. - melanoma removed left arm and chest. - partial excision bone left 2nd toe. Medical A Surgical History Notes nd Hematologic/Lymphatic  varicose vein of leg, mixed hyperlipidemia Respiratory dyspnea, chronic bronchitis, pulmonary hypertension, pulmonary emphysema Cardiovascular aortic calcification, tachycardia, left ventricular dysfunction, bradycardia Gastrointestinal Gerd Endocrine prediabetes, hypothyroidism Genitourinary bladder disorder Integumentary (Skin) contact dermatitis Musculoskeletal acquired plantar porokeratosis, neurogenic claudication d/t lumbar spinal stenosis, supraspinatus tendinitis, restless leg syndrome, spinal stenosis of lumbar region Neurologic TIA, insomnia Objective Constitutional Slightly hypertensive. Slightly tachycardic. no acute distress. Vitals Time Taken: 11:24 AM, Height: 63 in, Weight: 115 lbs, BMI: 20.4, Temperature: 97.9 F, Pulse: 105 bpm, Respiratory Rate: 18 breaths/min, Blood Pressure: 149/75 mmHg. Respiratory Normal work of breathing on room air. General Notes: 07/02/2023: Her wounds look better this week. The periwound erythema and irritation that have typically been present are not there today. There  is [name], [name] ([id number]) [id number] Page 9 of 13 slough on the wound surfaces. The small wound on the plantar surface of her great toe has contracted, as well. Very thin biofilm on the surface here. Integumentary (Hair, Skin) Wound #4 status is Open. Original cause of wound was Trauma. The date acquired was: 11/27/2022. The wound has been in treatment 22 weeks. The wound is located on the Left,Anterior Lower Leg. The wound measures 3cm length x 2.9cm width x 0.1cm depth; 6.833cm^2 area and 0.683cm^3 volume. There is Fat Layer (Subcutaneous Tissue) exposed. There is no tunneling or undermining noted. There is a medium amount of serosanguineous drainage noted. The wound margin is distinct with the outline attached to the wound base. There is small (1-33%) red granulation within the wound bed. There is a large (67-100%) amount of necrotic tissue within the wound bed including Adherent Slough. The periwound skin appearance had no abnormalities noted for moisture. The periwound skin appearance exhibited: Scarring, Hemosiderin Staining. The periwound skin appearance did not exhibit: Callus, Crepitus, Excoriation, Induration, Rash, Atrophie Blanche, Cyanosis, Ecchymosis, Mottled, Pallor, Rubor, Erythema. Periwound temperature was noted as No Abnormality. Wound #7 status is Open. Original cause of wound was Blister. The date acquired was: 06/18/2023. The wound has been in treatment 2 weeks. The wound is located on the Plantar T Great. The wound measures 0.5cm length x 0.3cm width x 0.1cm depth; 0.118cm^2 area and 0.012cm^3 volume. There is no oe tunneling or undermining noted. There is a medium amount of drainage noted. There is large (67-100%) pink granulation within the wound bed. There is no necrotic tissue within the wound bed. The periwound skin appearance had no abnormalities noted for texture. The periwound skin appearance had no abnormalities noted for color. The  periwound skin appearance did not exhibit: Dry/Scaly, Maceration. Periwound temperature was noted as No Abnormality. 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 There was a Selective/Open Wound Non-Viable Tissue Debridement with a total area of 6.83 sq cm performed by Duanne Guess, MD. With the following instrument(s): Curette to remove Non-Viable tissue/material. Material removed includes [facility] after achieving pain control using Lidocaine 4% Topical Solution. No specimens were taken. A time out was conducted at 12:10, prior to the start of the procedure. A Minimum amount of bleeding was controlled with Pressure. The procedure was tolerated well. Post Debridement Measurements: 3cm length x 2.9cm width x 0.1cm depth; 0.683cm^3 volume. Character of Wound/Ulcer Post  Debridement requires further debridement. Severity of Tissue Post Debridement is: Fat layer exposed. Post procedure Diagnosis Wound #4: Same as Pre-Procedure General Notes: Scribed for Dr. Lady Gary by Tommie Ard, RN. Wound #7 Pre-procedure diagnosis of Wound #7 is a T be determined located on the Plantar T Great . There was a Selective/Open Wound Non-Viable Tissue o oe Debridement with a total area of 0.12 sq cm performed by Duanne Guess, MD. With the following instrument(s): Curette to remove Non-Viable tissue/material. Material removed includes [facility] after achieving pain control using Lidocaine 4% Topical Solution. No specimens were taken. A time out was conducted at 12:10, prior to the start of the procedure. A Minimum amount of bleeding was  controlled with Pressure. The procedure was tolerated well. Post Debridement Measurements: 0.5cm length x 0.3cm width x 0.1cm depth; 0.012cm^3 volume. Character of Wound/Ulcer Post Debridement requires further debridement. Post procedure Diagnosis Wound #7: Same as Pre-Procedure General Notes: Scribed for Dr. Lady Gary by Tommie Ard, RN. Plan Follow-up Appointments: Return appointment in 3 weeks. Anesthetic: (In clinic) Topical Lidocaine 5% applied to wound bed Bathing/ Shower/ Hygiene: May shower and wash wound with soap and water. - with dressing changes Edema Control - Lymphedema / SCD / Other: Elevate legs to the level of the heart or above for 30 minutes daily and/or when sitting for 3-4 times a day throughout the day. Avoid standing for long periods of time. Exercise regularly If compression wraps slide down please call wound center and speak with a nurse. WOUND #4: - Lower Leg Wound Laterality: Left, Anterior Cleanser: Soap and Water Every Other Day/30 Days Discharge Instructions: May shower and wash wound with dial antibacterial soap and water prior to dressing change. Cleanser: Vashe 5.8 (oz) Every Other Day/30 Days Discharge Instructions: Cleanse the wound with Vashe prior to applying a clean dressing using gauze sponges, not tissue or cotton balls. Peri-Wound Care: Triamcinolone 15 (g) Every Other Day/30 Days Discharge Instructions: Use triamcinolone 15 (g) as directed Peri-Wound Care: Sween Lotion (Moisturizing lotion) Every Other Day/30 Days Discharge Instructions: Apply moisturizing lotion as directed Topical: Gentamicin Every Other Day/30 Days Discharge Instructions: As directed by physician Topical: Mupirocin Ointment Every Other Day/30 Days Discharge Instructions: Apply Mupirocin (Bactroban) as instructed Secondary Dressing: T Non-adherent Dressing, 2x3 in Every Other Day/30 Days elfa [name], [name] ([id number]) [id number].pdf Page 10 of  13 Discharge Instructions: Apply over primary dressing as directed. Com pression Wrap: Kerlix Roll 4.5x3.1 (in/yd) Every Other Day/30 Days Discharge Instructions: Apply Kerlix and Coban compression as directed. Com pression Wrap: Coban Self-Adherent Wrap 4x5 (in/yd) Every Other Day/30 Days Discharge Instructions: Apply over Kerlix as directed. WOUND #7: - T Great Wound Laterality: Plantar oe Cleanser: Soap and Water Every Other Day/30 Days Discharge Instructions: May shower and wash wound with dial antibacterial soap and water prior to dressing change. Cleanser: Vashe 5.8 (oz) Every Other Day/30 Days Discharge Instructions: Cleanse the wound with Vashe prior to applying a clean dressing using gauze sponges, not tissue or cotton balls. Topical: Gentamicin Every Other Day/30 Days Discharge Instructions: As directed by physician Topical: Mupirocin Ointment Every Other Day/30 Days Discharge Instructions: Apply Mupirocin (Bactroban) as instructed Secondary Dressing: T Non-adherent Dressing, 2x3 in Every Other Day/30 Days elfa Discharge Instructions: Apply over primary dressing as directed. Secured With: Insurance underwriter, Sterile 2x75 (in/in) Every Other Day/30 Days Discharge Instructions: Secure with stretch gauze as directed. Secured With: 20M [employer] Scientist, research (life sciences) Surgical T 2x10 (in/yd) Every Other Day/30 Days ape Discharge Instructions: Secure with tape as  directed. 07/02/2023: Her wounds look better this week. The periwound erythema and irritation that have typically been present are not there today. There is slough on the wound surfaces. The small wound on the plantar surface of her great toe has contracted, as well. Very thin biofilm on the surface here. I used a curette to debride slough off of all of the leg wound surfaces and some biofilm from the toe site. We will continue the mixture of topical gentamicin and mupirocin with T elfa, Kerlix and Coban. The patient's husband  is changing her dressings at home and doing an excellent job. Due to the expense of clinic visits, they would like to extend the interval a bit more, so I will see them in 3 weeks. Electronic Signature(s) Signed: 07/03/2023 2:31:04 PM By: Shawn Stall RN, BSN Signed: 07/06/2023 9:11:23 AM By: Duanne Guess MD FACS Previous Signature: 07/02/2023 12:17:11 PM Version By: Duanne Guess MD FACS Entered By: Shawn Stall on 07/03/2023 11:23:31 -------------------------------------------------------------------------------- HxROS Details Patient Name: Date of Service: [name] RMA Long. 07/02/2023 11:30 A M Medical Record Number: [id number] Patient Account Number: [id number] Date of Birth/Sex: Treating RN: 01/31/1946 (77 y.o. F) Primary Care Provider: Dina Rich Other Clinician: Referring Provider: Treating Provider/Extender: Pincus Badder in Treatment: 22 Information Obtained From Patient Caregiver Eyes Medical History: Positive for: Cataracts - bil removed Negative for: Glaucoma; Optic Neuritis Ear/Nose/Mouth/Throat Medical History: Negative for: Chronic sinus problems/congestion; Middle ear problems Hematologic/Lymphatic Medical History: Positive for: Anemia Past Medical History Notes: varicose vein of leg, mixed hyperlipidemia Respiratory Medical History: Positive for: Chronic Obstructive Pulmonary Disease (COPD) Past Medical History NotesLAVONA, [name] ([id number]) [id number] Page 11 of 13 dyspnea, chronic bronchitis, pulmonary hypertension, pulmonary emphysema Cardiovascular Medical History: Positive for: Coronary Artery Disease; Peripheral Venous Disease - varicose veins Past Medical History Notes: aortic calcification, tachycardia, left ventricular dysfunction, bradycardia Gastrointestinal Medical History: Past Medical History Notes: Gerd Endocrine Medical History: Negative for: Type I Diabetes; Type II Diabetes Past  Medical History Notes: prediabetes, hypothyroidism Genitourinary Medical History: Negative for: End Stage Renal Disease Past Medical History Notes: bladder disorder Immunological Medical History: Positive for: Raynauds Negative for: Lupus Erythematosus; Scleroderma Integumentary (Skin) Medical History: Negative for: History of Burn Past Medical History Notes: contact dermatitis Musculoskeletal Medical History: Positive for: Rheumatoid Arthritis; Osteoarthritis Negative for: Gout Past Medical History Notes: acquired plantar porokeratosis, neurogenic claudication d/t lumbar spinal stenosis, supraspinatus tendinitis, restless leg syndrome, spinal stenosis of lumbar region Neurologic Medical History: Past Medical History Notes: TIA, insomnia Oncologic Medical History: Negative for: Received Chemotherapy; Received Radiation Psychiatric Medical History: Negative for: Anorexia/bulimia; Confinement Anxiety HBO Extended History Items Eyes: Cataracts Immunizations Pneumococcal Vaccine: Received Pneumococcal Vaccination: Yes Received Pneumococcal Vaccination On or After 60th Birthday: Yes Implantable Devices None [name], [name] ([id number]) [id number] Page 12 of 13 Hospitalization / Surgery History Type of Hospitalization/Surgery bil cataract removal left shoiulder replacement right rotator cuff repair multiple lumbar spine surgeries melanoma removed left arm and chest partial excision bone left 2nd toe Family and Social History Unknown History: Yes; Former smoker; Marital Status - Married; Alcohol Use: Never; Drug Use: No History; Caffeine Use: Rarely; Financial Concerns: No; Food, Clothing or Shelter Needs: No; Support System Lacking: No; Transportation Concerns: No Electronic Signature(s) Signed: 07/02/2023 12:27:20 PM By: Duanne Guess MD FACS Entered By: Duanne Guess on 07/02/2023  09:15:06 -------------------------------------------------------------------------------- SuperBill Details Patient Name: Date of Service: [name] RMA Long. 07/02/2023 Medical Record Number: [id number] Patient Account Number: [id number] Date of Birth/Sex: Treating RN: 06-09-1946 (77  y.o. F) Primary Care Provider: Dina Rich Other Clinician: Referring Provider: Treating Provider/Extender: Pincus Badder in Treatment: 22 Diagnosis Coding ICD-10 Codes Code Description [phone] Non-pressure chronic ulcer of other part of left lower leg with fat layer exposed L97.522 Non-pressure chronic ulcer of other part of left foot with fat layer exposed I27.20 Pulmonary hypertension, unspecified I51.9 Heart disease, unspecified Facility Procedures : CPT4 Code: [id number] Description: 97597 - DEBRIDE WOUND 1ST 20 SQ CM OR < ICD-10 Diagnosis Description L97.822 Non-pressure chronic ulcer of other part of left lower leg with fat layer expose L97.522 Non-pressure chronic ulcer of other part of left foot with fat layer  exposed Modifier: d Quantity: 1 Physician Procedures : CPT4 Code Description Modifier [id number] - WC PHYS LEVEL 4 - EST PT 25 ICD-10 Diagnosis Description L97.822 Non-pressure chronic ulcer of other part of left lower leg with fat layer exposed L97.522 Non-pressure chronic ulcer of other part of left  foot with fat layer exposed I27.20 Pulmonary hypertension, unspecified I51.9 Heart disease, unspecified Quantity: 1 : [id number] 97597 - WC PHYS DEBR WO ANESTH 20 SQ CM ICD-10 Diagnosis Description L97.822 Non-pressure chronic ulcer of other part of left lower leg with fat layer exposed L97.522 Non-pressure chronic ulcer of other part of left foot with fat layer exposed Quantity: 1 Electronic Signature(s) Signed: 07/02/2023 12:17:33 PM By: Duanne Guess MD FACS Entered By: Duanne Guess on 07/02/2023 09:17:33 Dains, [name] ([id number])  [id number] Page 13 of 13

## 2023-07-02 NOTE — Progress Notes (Addendum)
Long, Elaine ([id number]) [id number] Page 1 of 9 Visit Report for 07/02/2023 Arrival Information Details Patient Name: Date of Service: [name], [name] Long. 07/02/2023 11:30 A M Medical Record Number: [id number] Patient Account Number: [id number] Date of Birth/Sex: Treating RN: 1946/04/18 (77 y.o. F) Primary Care Lyra Alaimo: Dina Rich Other Clinician: Referring Ermon Sagan: Treating Anastashia Westerfeld/Extender: Pincus Badder in Treatment: 22 Visit Information History Since Last Visit All ordered tests and consults were completed: No Patient Arrived: Ambulatory Added or deleted any medications: No Arrival Time: 11:23 Any new allergies or adverse reactions: No Accompanied By: husband Had a fall or experienced change in No Transfer Assistance: None activities of daily living that may affect Patient Identification Verified: Yes risk of falls: Secondary Verification Process Completed: Yes Signs or symptoms of abuse/neglect since last visito No Patient Requires Transmission-Based Precautions: No Hospitalized since last visit: No Patient Has Alerts: Yes Implantable device outside of the clinic excluding No Patient Alerts: ABI L 1.16 02/19/22 cellular tissue based products placed in the center since last visit: Pain Present Now: No Electronic Signature(s) Signed: 07/02/2023 11:42:15 AM By: Dayton Scrape Entered By: Dayton Scrape on 07/02/2023 08:24:38 -------------------------------------------------------------------------------- Encounter Discharge Information Details Patient Name: Date of Service: [name] Long. 07/02/2023 11:30 A M Medical Record Number: [id number] Patient Account Number: [id number] Date of Birth/Sex: Treating RN: [birth year] (77 y.o. [name] Primary Care Seena Long: Dina Rich Other Clinician: Referring Elaine Neuroth: Treating Patrica Long/Extender: Pincus Badder in Treatment: 22 Encounter Discharge Information  Items Post Procedure Vitals Discharge Condition: Stable Temperature (F): 97.9 Ambulatory Status: Ambulatory Pulse (bpm): 105 Discharge Destination: Home Respiratory Rate (breaths/min): 18 Transportation: Private Auto Blood Pressure (mmHg): 149/75 Accompanied By: spouse Schedule Follow-up Appointment: Yes Clinical Summary of Care: Electronic Signature(s) Signed: 07/02/2023 12:23:34 PM By: Tommie Ard RN Entered By: Tommie Ard on 07/02/2023 09:23:34 [name] ([id number]) [id number] Page 2 of 9 -------------------------------------------------------------------------------- Lower Extremity Assessment Details Patient Name: Date of Service: [name], [name] Long. 07/02/2023 11:30 A M Medical Record Number: [id number] Patient Account Number: [id number] Date of Birth/Sex: Treating RN: 04-19-1946 (77 y.o. [name] Primary Care [name]: Dina Rich Other Clinician: Referring Kaydan Long: Treating Ramere Downs/Extender: Pincus Badder in Treatment: 22 Edema Assessment Assessed: [name]: No] [name]: No] Edema: [Left: N] [Right: o] Calf Left: Right: Point of Measurement: 25 cm From Medial Instep 28 cm Ankle Left: Right: Point of Measurement: 10 cm From Medial Instep 18 cm Vascular Assessment Pulses: Dorsalis Pedis Palpable: [Left:Yes] Extremity colors, hair growth, and conditions: Extremity Color: [Left:Hyperpigmented] Hair Growth on Extremity: [Left:No] Temperature of Extremity: [Left:Warm < 3 seconds] Electronic Signature(s) Signed: 07/02/2023 12:18:32 PM By: Tommie Ard RN Entered By: Tommie Ard on 07/02/2023 09:18:31 -------------------------------------------------------------------------------- Multi Wound Chart Details Patient Name: Date of Service: [name] Long. 07/02/2023 11:30 A M Medical Record Number: [id number] Patient Account Number: [id number] Date of Birth/Sex: Treating RN: 08-11-1946 (77 y.o. F) Primary Care  [name]: Dina Rich Other Clinician: Referring [name]: Treating [name]/Extender: Pincus Badder in Treatment: 22 Vital Signs Height(in): 63 Pulse(bpm): 105 Weight(lbs): 115 Blood Pressure(mmHg): 149/75 Body Mass Index(BMI): 20.4 Temperature(F): 97.9 Respiratory Rate(breaths/min): 18 [4:Photos:] [N/A:N/A [id number] Page 3 of 9] Left, Anterior Lower Leg Plantar T Great oe N/A Wound Location: Trauma Blister N/A Wounding Event: Venous Leg Ulcer T be determined o N/A Primary Etiology: Cataracts, Anemia, Chronic Cataracts, Anemia, Chronic N/A Comorbid History: Obstructive Pulmonary Disease Obstructive Pulmonary Disease (COPD), Coronary Artery Disease, (COPD), Coronary Artery Disease, Peripheral Venous Disease, Peripheral  Venous Disease, Raynauds, Rheumatoid Arthritis, Raynauds, Rheumatoid Arthritis, Osteoarthritis Osteoarthritis 11/27/2022 06/18/2023 N/A Date Acquired: 22 2 N/A Weeks of Treatment: Open Open N/A Wound Status: No No N/A Wound Recurrence: Yes No N/A Clustered Wound: 4 N/A N/A Clustered Quantity: 3x2.9x0.1 0.5x0.3x0.1 N/A Measurements L x W x D (cm) 6.833 0.118 N/A A (cm) : rea 0.683 0.012 N/A Volume (cm) : 21.60% 54.40% N/A % Reduction in Area: 21.70% 53.80% N/A % Reduction in Volume: Full Thickness Without Exposed Full Thickness Without Exposed N/A Classification: Support Structures Support Structures Medium Medium N/A Exudate Amount: Serosanguineous N/A N/A Exudate Type: red, brown N/A N/A Exudate Color: Distinct, outline attached N/A N/A Wound Margin: Small (1-33%) Large (67-100%) N/A Granulation Amount: Red Pink N/A Granulation Quality: Large (67-100%) None Present (0%) N/A Necrotic Amount: Fat Layer (Subcutaneous Tissue): Yes Fascia: No N/A Exposed Structures: Fascia: No Fat Layer (Subcutaneous Tissue): No Tendon: No Tendon: No Muscle: No Muscle: No Joint: No Joint:  No Bone: No Bone: No Medium (34-66%) Small (1-33%) N/A Epithelialization: Scarring: Yes No Abnormalities Noted N/A Periwound Skin Texture: Excoriation: No Induration: No Callus: No Crepitus: No Rash: No Maceration: Yes Maceration: No N/A Periwound Skin Moisture: Dry/Scaly: No Dry/Scaly: No Hemosiderin Staining: Yes No Abnormalities Noted N/A Periwound Skin Color: Atrophie Blanche: No Cyanosis: No Ecchymosis: No Erythema: No Mottled: No Pallor: No Rubor: No No Abnormality No Abnormality N/A Temperature: Treatment Notes Electronic Signature(s) Signed: 07/02/2023 12:12:08 PM By: Duanne Guess MD FACS Entered By: Duanne Guess on 07/02/2023 09:12:08 -------------------------------------------------------------------------------- Multi-Disciplinary Care Plan Details Patient Name: Date of Service: [name] Long. 07/02/2023 11:30 A M Medical Record Number: [id number] Patient Account Number: [id number] Date of Birth/Sex: Treating RN: 1946/06/27 (77 y.o. [name] Primary Care Margareth Kanner: Dina Rich Other Clinician: Referring Azaleah Usman: Treating Ellisa Devivo/Extender: Pincus Badder in Treatment: [address], [place] Long ([id number]) [id number] Page 4 of 9 Active Inactive Nutrition Nursing Diagnoses: Potential for alteratiion in Nutrition/Potential for imbalanced nutrition Goals: Patient/caregiver agrees to and verbalizes understanding of need to obtain nutritional consultation Date Initiated: 01/26/2023 Target Resolution Date: 07/24/2023 Goal Status: Active Patient/caregiver will maintain therapeutic glucose control Date Initiated: 01/26/2023 Target Resolution Date: 07/24/2023 Goal Status: Active Interventions: Assess patient nutrition upon admission and as needed per policy Provide education on nutrition Treatment Activities: Giving encouragement to exercise : 01/26/2023 Notes: Pain, Acute or Chronic Nursing  Diagnoses: Pain, acute or chronic: actual or potential Potential alteration in comfort, pain Goals: Patient will verbalize adequate pain control and receive pain control interventions during procedures as needed Date Initiated: 01/26/2023 Date Inactivated: 05/29/2023 Target Resolution Date: 05/29/2023 Goal Status: Met Patient/caregiver will verbalize comfort level met Date Initiated: 01/26/2023 Target Resolution Date: 06/26/2023 Goal Status: Active Interventions: Complete pain assessment as per visit requirements Encourage patient to take pain medications as prescribed Provide education on pain management Treatment Activities: Administer pain control measures as ordered : 01/26/2023 Notes: Electronic Signature(s) Signed: 07/02/2023 12:22:14 PM By: Tommie Ard RN Previous Signature: 07/02/2023 12:21:52 PM Version By: Tommie Ard RN Entered By: Tommie Ard on 07/02/2023 09:22:13 -------------------------------------------------------------------------------- Pain Assessment Details Patient Name: Date of Service: [name] Long. 07/02/2023 11:30 A M Medical Record Number: [id number] Patient Account Number: [id number] Date of Birth/Sex: Treating RN: 05/24/1946 (77 y.o. F) Primary Care Makoto Sellitto: Dina Rich Other Clinician: Referring Shiane Wenberg: Treating Terrah Decoster/Extender: Pincus Badder in Treatment: 22 Active Problems Location of Pain Severity and Description of Pain Patient Has Paino No SHARIS, FESPERMAN ([id number]) [id number] Page 5 of 9 Patient Has Paino  No Site Locations Pain Management and Medication Current Pain Management: Electronic Signature(s) Signed: 07/02/2023 11:42:15 AM By: Dayton Scrape Entered By: Dayton Scrape on 07/02/2023 08:25:18 -------------------------------------------------------------------------------- Patient/Caregiver Education Details Patient Name: Date of Service: [name] Long. 9/5/2024andnbsp11:30 A M Medical  Record Number: [id number] Patient Account Number: [id number] Date of Birth/Gender: Treating RN: Jul 29, 1946 (77 y.o. [name] Primary Care Physician: Dina Rich Other Clinician: Referring Physician: Treating Physician/Extender: Pincus Badder in Treatment: 22 Education Assessment Education Provided To: Patient Education Topics Provided Wound Debridement: Methods: Explain/Verbal Responses: Reinforcements needed, State content correctly Electronic Signature(s) Signed: 07/02/2023 12:40:06 PM By: Tommie Ard RN Entered By: Tommie Ard on 07/02/2023 09:22:25 -------------------------------------------------------------------------------- Wound Assessment Details Patient Name: Date of Service: [name] Long. 07/02/2023 11:30 A M Medical Record Number: [id number] Patient Account Number: [id number] Date of Birth/Sex: Treating RN: 1945/12/05 (77 y.o. Nkechi Smathers, Lamae Long ([id number]) [id number] Page 6 of 9 Primary Care [name]: Dina Rich Other Clinician: Referring Bedelia Pong: Treating Zamarah Ullmer/Extender: Pincus Badder in Treatment: 22 Wound Status Wound Number: 4 Primary Venous Leg Ulcer Etiology: Wound Location: Left, Anterior Lower Leg Wound Open Wounding Event: Trauma Status: Date Acquired: 11/27/2022 Comorbid Cataracts, Anemia, Chronic Obstructive Pulmonary Disease Weeks Of Treatment: 22 History: (COPD), Coronary Artery Disease, Peripheral Venous Disease, Clustered Wound: Yes Raynauds, Rheumatoid Arthritis, Osteoarthritis Photos Wound Measurements Length: (cm) Width: (cm) Depth: (cm) Clustered Quantity: Area: (cm) Volume: (cm) 3 % Reduction in Area: 21.6% 2.9 % Reduction in Volume: 21.7% 0.1 Epithelialization: Medium (34-66%) 4 Tunneling: No 6.833 Undermining: No 0.683 Wound Description Classification: Full Thickness Without Exposed Sup Wound Margin: Distinct, outline attached Exudate  Amount: Medium Exudate Type: Serosanguineous Exudate Color: red, brown port Structures Foul Odor After Cleansing: No Slough/Fibrino Yes Wound Bed Granulation Amount: Small (1-33%) Exposed Structure Granulation Quality: Red Fascia Exposed: No Necrotic Amount: Large (67-100%) Fat Layer (Subcutaneous Tissue) Exposed: Yes Necrotic Quality: Adherent Slough Tendon Exposed: No Muscle Exposed: No Joint Exposed: No Bone Exposed: No Periwound Skin Texture Texture Color No Abnormalities Noted: No No Abnormalities Noted: No Callus: No Atrophie Blanche: No Crepitus: No Cyanosis: No Excoriation: No Ecchymosis: No Induration: No Erythema: No Rash: No Hemosiderin Staining: Yes Scarring: Yes Mottled: No Pallor: No Moisture Rubor: No No Abnormalities Noted: Yes Temperature / Pain Temperature: No Abnormality Treatment Notes Wound #4 (Lower Leg) Wound Laterality: Left, Anterior Cleanser Soap and Water Discharge Instruction: May shower and wash wound with dial antibacterial soap and water prior to dressing change. Vashe 5.8 (oz) Discharge Instruction: Cleanse the wound with Vashe prior to applying a clean dressing using gauze sponges, not tissue or cotton balls. [name], [name] ([id number]) [id number] Page 7 of 9 Peri-Wound Care Triamcinolone 15 (g) Discharge Instruction: Use triamcinolone 15 (g) as directed Sween Lotion (Moisturizing lotion) Discharge Instruction: Apply moisturizing lotion as directed Topical Gentamicin Discharge Instruction: As directed by physician Mupirocin Ointment Discharge Instruction: Apply Mupirocin (Bactroban) as instructed Primary Dressing Secondary Dressing T Non-adherent Dressing, 2x3 in elfa Discharge Instruction: Apply over primary dressing as directed. Secured With Compression Wrap Kerlix Roll 4.5x3.1 (in/yd) Discharge Instruction: Apply Kerlix and Coban compression as directed. Coban Self-Adherent Wrap 4x5  (in/yd) Discharge Instruction: Apply over Kerlix as directed. Compression Stockings Add-Ons Electronic Signature(s) Signed: 07/02/2023 12:18:54 PM By: Tommie Ard RN Previous Signature: 07/02/2023 11:42:15 AM Version By: Dayton Scrape Entered By: Tommie Ard on 07/02/2023 09:18:54 -------------------------------------------------------------------------------- Wound Assessment Details Patient Name: Date of Service: [name] Long. 07/02/2023 11:30 A M Medical Record Number: [id number] Patient Account  Number: [id number] Date of Birth/Sex: Treating RN: 1946-02-01 (77 y.o. F) Primary Care Zach Tietje: Dina Rich Other Clinician: Referring Rjay Revolorio: Treating Shaterra Sanzone/Extender: Pincus Badder in Treatment: 22 Wound Status Wound Number: 7 Primary T be determined o Etiology: Wound Location: Plantar T Great oe Wound Open Wounding Event: Blister Status: Date Acquired: 06/18/2023 Comorbid Cataracts, Anemia, Chronic Obstructive Pulmonary Disease Weeks Of Treatment: 2 History: (COPD), Coronary Artery Disease, Peripheral Venous Disease, Clustered Wound: No Raynauds, Rheumatoid Arthritis, Osteoarthritis Photos [name], [name] ([id number]) [id number] Page 8 of 9 Wound Measurements Length: (cm) 0.5 Width: (cm) 0.3 Depth: (cm) 0.1 Area: (cm) 0.118 Volume: (cm) 0.012 % Reduction in Area: 54.4% % Reduction in Volume: 53.8% Epithelialization: Small (1-33%) Tunneling: No Undermining: No Wound Description Classification: Full Thickness Without Exposed Support Structures Exudate Amount: Medium Foul Odor After Cleansing: No Slough/Fibrino No Wound Bed Granulation Amount: Large (67-100%) Exposed Structure Granulation Quality: Pink Fascia Exposed: No Necrotic Amount: None Present (0%) Fat Layer (Subcutaneous Tissue) Exposed: No Tendon Exposed: No Muscle Exposed: No Joint Exposed: No Bone Exposed: No Periwound Skin Texture Texture Color No  Abnormalities Noted: Yes No Abnormalities Noted: Yes Moisture Temperature / Pain No Abnormalities Noted: No Temperature: No Abnormality Dry / Scaly: No Maceration: No Treatment Notes Wound #7 (Toe Great) Wound Laterality: Plantar Cleanser Soap and Water Discharge Instruction: May shower and wash wound with dial antibacterial soap and water prior to dressing change. Vashe 5.8 (oz) Discharge Instruction: Cleanse the wound with Vashe prior to applying a clean dressing using gauze sponges, not tissue or cotton balls. Peri-Wound Care Topical Gentamicin Discharge Instruction: As directed by physician Mupirocin Ointment Discharge Instruction: Apply Mupirocin (Bactroban) as instructed Primary Dressing Secondary Dressing T Non-adherent Dressing, 2x3 in elfa Discharge Instruction: Apply over primary dressing as directed. Secured With Conforming Stretch Gauze Bandage, Sterile 2x75 (in/in) Discharge Instruction: Secure with stretch gauze as directed. 29M Medipore Soft Cloth Surgical T 2x10 (in/yd) ape Discharge Instruction: Secure with tape as directed. Compression Wrap Compression Stockings Add-Ons Electronic Signature(s) Signed: 07/02/2023 12:19:07 PM By: Tommie Ard RN Previous Signature: 07/02/2023 11:42:15 AM Version By: Dayton Scrape Entered By: Tommie Ard on 07/02/2023 09:19:06 [name] ([id number]) [id number] Page 9 of 9 -------------------------------------------------------------------------------- Vitals Details Patient Name: Date of Service: [name], [name] Long Long. 07/02/2023 11:30 A M Medical Record Number: [id number] Patient Account Number: [id number] Date of Birth/Sex: Treating RN: [birth year] (77 y.o. F) Primary Care Jayma Volpi: Dina Rich Other Clinician: Referring Dejour Vos: Treating Nila Winker/Extender: Pincus Badder in Treatment: 22 Vital Signs Time Taken: 11:24 Temperature (F): 97.9 Height (in): 63 Pulse (bpm):  105 Weight (lbs): 115 Respiratory Rate (breaths/min): 18 Body Mass Index (BMI): 20.4 Blood Pressure (mmHg): 149/75 Reference Range: 80 - 120 mg / dl Electronic Signature(s) Signed: 07/02/2023 11:42:15 AM By: Dayton Scrape Entered By: Dayton Scrape on 07/02/2023 08:25:09

## 2023-07-16 ENCOUNTER — Ambulatory Visit (HOSPITAL_BASED_OUTPATIENT_CLINIC_OR_DEPARTMENT_OTHER): Payer: PPO | Admitting: General Surgery

## 2023-07-20 ENCOUNTER — Encounter (HOSPITAL_BASED_OUTPATIENT_CLINIC_OR_DEPARTMENT_OTHER): Payer: PPO | Admitting: General Surgery

## 2023-07-20 DIAGNOSIS — L97522 Non-pressure chronic ulcer of other part of left foot with fat layer exposed: Secondary | ICD-10-CM | POA: Diagnosis not present

## 2023-07-20 NOTE — Progress Notes (Signed)
Number: [id number] Patient Account Number: [id number] Date of Birth/Sex: Treating RN: 1946/06/25 (77 y.o. [name], [name] ([id number]) [id number] Page 6 of 11 Primary Care Provider: Dina Rich Other Clinician: Referring Provider: Treating Provider/Extender: Pincus Badder in Treatment: 25 Subjective Chief Complaint Information obtained from Patient Patient seen for complaints of Non-Healing Wound. History of Present Illness (HPI) ADMISSION 02/19/2022 This is a 77 year old woman with minimal pertinent medical history. She does have COPD and pulmonary hypertension, but is not diabetic and is not a smoker. About 6 months ago, she and her husband got a new beagle puppy. The puppy scratched her leg and the scratches ultimately deteriorated into ulcerations. Apparently she sought care with a dermatologist who recommended applying a one-to-one mixture of peroxide and water to the wounds followed by a thick layer of Vaseline. She continue this for some time but then saw her primary care provider who told her to discontinue  the peroxide. She has not been on any antibiotics for the scratches. Today, there are 3 separate wounds on her anterior tibial surface on the left. They are tender and her leg has localized swelling. There is yellow slough buildup in each of the wound bases. ABI in clinic today was normal at 1.16. 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 02/25/2022: Over the past week, the wounds themselves have not improved tremendously but she has noticed some stinging and the periwound skin is quite inflamed. I took a culture last week that had low levels of methicillin-resistant Staph aureus. Because it was fairly low level, I did not prescribe an oral antibiotic. I had planned to change her to mupirocin today, however. We have been using Santyl under Hydrofera Blue with 3 layer compression. 03/04/2022: The wounds have improved quite a bit over the past week. They are less painful and red. She has been taking the prescribed doxycycline but says that it gives her a stomachache. We have been using mupirocin with Hydrofera Blue and 3 layer compression. 03/11/2022: Continued improvement of the wounds. They have contracted quite a bit and have a good surface of healthy granulation tissue present. There is some dried eschar present around all 3 of the lesions. 03/17/2022: Elaine significant change in the wounds from last week. The surface is clean with just a little bit of eschar and slough accumulation. Elaine odor or drainage. Elaine concern for infection. 03/26/2022: All of her wounds are smaller this week. There is good granulation tissue on the surface of each. Elaine slough accumulation. There is just some dry skin around the wounds but not actually involving the wounds. Elaine concern for infection. 04/04/2022: The 2 proximal wounds have healed. The distal wound is much smaller and just has a little bit of dry eschar around the edges. 04/11/2022: Her wound is nearly healed. It is clean without concern for  infection. 04/18/2022: Her wound has healed. READMISSION 01/26/2023 About 8 weeks ago, she sustained another scratch from her Beagle on her left anterior tibial surface.. She has been trying to treat the wound at home with Vaseline. There is thick slough buildup on the wound surface and the periwound skin looks a bit macerated. Elaine overt sign of infection. 01/29/2023: She came in for an unscheduled visit today because her wound began bleeding. It apparently bled quite profusely and her husband used an over-the- counter topical agent to get it to stop. On inspection today, he managed to achieve good hemostasis and there is Elaine ongoing blood loss. 02/03/2023: Elaine further  ANA, SCHMUHL ([id number]) [phone].pdf Page 1 of 11 Visit Report for 07/20/2023 Chief Complaint Document Details Patient Name: Date of Service: [name], Elaine RMA Long. 07/20/2023 10:45 A M Medical Record Number: [id number] Patient Account Number: [id number] Date of Birth/Sex: Treating RN: [birth year] (77 y.o. F) Primary Care Provider: Dina Rich Other Clinician: Referring Provider: Treating Provider/Extender: Pincus Badder in Treatment: 25 Information Obtained from: Patient Chief Complaint Patient seen for complaints of Non-Healing Wound. Electronic Signature(s) Signed: 07/20/2023 11:16:49 AM By: Duanne Guess MD FACS Entered By: Duanne Guess on 07/20/2023 08:16:48 -------------------------------------------------------------------------------- Debridement Details Patient Name: Date of Service: Elaine Hurst RMA Long. 07/20/2023 10:45 A M Medical Record Number: [id number] Patient Account Number: [id number] Date of Birth/Sex: Treating RN: 01-17-1946 (77 y.o. [name] Primary Care Provider: Dina Rich Other Clinician: Referring Provider: Treating Provider/Extender: Pincus Badder in Treatment: 25 Debridement Performed for Assessment: Wound #4 Left,Anterior Lower Leg Performed By: Physician Duanne Guess, MD The following information was scribed by: Samuella Bruin The information was scribed for: Duanne Guess Debridement Type: Debridement Severity of Tissue Pre Debridement: Fat layer exposed Level of Consciousness (Pre-procedure): Awake and Alert Pre-procedure Verification/Time Out Yes - 10:55 Taken: Start Time: 10:55 Pain Control: Lidocaine 4% Topical Solution Percent of Wound Bed Debrided: 80% T Area Debrided (cm): otal 5.65 Tissue and other material debrided: Non-Viable, Eschar, Slough, Slough Level: Non-Viable Tissue Debridement Description: Selective/Open Wound Instrument:  Curette Bleeding: Minimum Hemostasis Achieved: Pressure Response to Treatment: Procedure was tolerated well Level of Consciousness (Post- Awake and Alert procedure): Post Debridement Measurements of Total Wound Length: (cm) 3 Width: (cm) 3 Depth: (cm) 0.1 Volume: (cm) 0.707 Character of Wound/Ulcer Post Debridement: Improved Severity of Tissue Post Debridement: Fat layer exposed Potash, [name] ([id number]) [id number] Page 2 of 11 Post Procedure Diagnosis Same as Pre-procedure Electronic Signature(s) Signed: 07/20/2023 12:36:43 PM By: Duanne Guess MD FACS Signed: 07/20/2023 4:30:21 PM By: Gelene Mink By: Samuella Bruin on 07/20/2023 07:59:12 -------------------------------------------------------------------------------- HPI Details Patient Name: Date of Service: Elaine Hurst RMA Long. 07/20/2023 10:45 A M Medical Record Number: [id number] Patient Account Number: [id number] Date of Birth/Sex: Treating RN: 12/20/1945 (77 y.o. F) Primary Care Provider: Dina Rich Other Clinician: Referring Provider: Treating Provider/Extender: Pincus Badder in Treatment: 25 History of Present Illness HPI Description: ADMISSION 02/19/2022 This is a 77 year old woman with minimal pertinent medical history. She does have COPD and pulmonary hypertension, but is not diabetic and is not a smoker. About 6 months ago, she and her husband got a new beagle puppy. The puppy scratched her leg and the scratches ultimately deteriorated into ulcerations. Apparently she sought care with a dermatologist who recommended applying a one-to-one mixture of peroxide and water to the wounds followed by a thick layer of Vaseline. She continue this for some time but then saw her primary care provider who told her to discontinue the peroxide. She has not been on any antibiotics for the scratches. Today, there are 3 separate wounds on her anterior tibial surface  on the left. They are tender and her leg has localized swelling. There is yellow slough buildup in each of the wound bases. ABI in clinic today was normal at 1.16. 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 02/25/2022: Over the past week, the wounds themselves have not improved tremendously but she has noticed some stinging and the periwound skin is quite inflamed. I took a culture last week that had low levels of methicillin-resistant Staph aureus. Because it  Number: [id number] Patient Account Number: [id number] Date of Birth/Sex: Treating RN: 1946/06/25 (77 y.o. [name], [name] ([id number]) [id number] Page 6 of 11 Primary Care Provider: Dina Rich Other Clinician: Referring Provider: Treating Provider/Extender: Pincus Badder in Treatment: 25 Subjective Chief Complaint Information obtained from Patient Patient seen for complaints of Non-Healing Wound. History of Present Illness (HPI) ADMISSION 02/19/2022 This is a 77 year old woman with minimal pertinent medical history. She does have COPD and pulmonary hypertension, but is not diabetic and is not a smoker. About 6 months ago, she and her husband got a new beagle puppy. The puppy scratched her leg and the scratches ultimately deteriorated into ulcerations. Apparently she sought care with a dermatologist who recommended applying a one-to-one mixture of peroxide and water to the wounds followed by a thick layer of Vaseline. She continue this for some time but then saw her primary care provider who told her to discontinue  the peroxide. She has not been on any antibiotics for the scratches. Today, there are 3 separate wounds on her anterior tibial surface on the left. They are tender and her leg has localized swelling. There is yellow slough buildup in each of the wound bases. ABI in clinic today was normal at 1.16. 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 02/25/2022: Over the past week, the wounds themselves have not improved tremendously but she has noticed some stinging and the periwound skin is quite inflamed. I took a culture last week that had low levels of methicillin-resistant Staph aureus. Because it was fairly low level, I did not prescribe an oral antibiotic. I had planned to change her to mupirocin today, however. We have been using Santyl under Hydrofera Blue with 3 layer compression. 03/04/2022: The wounds have improved quite a bit over the past week. They are less painful and red. She has been taking the prescribed doxycycline but says that it gives her a stomachache. We have been using mupirocin with Hydrofera Blue and 3 layer compression. 03/11/2022: Continued improvement of the wounds. They have contracted quite a bit and have a good surface of healthy granulation tissue present. There is some dried eschar present around all 3 of the lesions. 03/17/2022: Elaine significant change in the wounds from last week. The surface is clean with just a little bit of eschar and slough accumulation. Elaine odor or drainage. Elaine concern for infection. 03/26/2022: All of her wounds are smaller this week. There is good granulation tissue on the surface of each. Elaine slough accumulation. There is just some dry skin around the wounds but not actually involving the wounds. Elaine concern for infection. 04/04/2022: The 2 proximal wounds have healed. The distal wound is much smaller and just has a little bit of dry eschar around the edges. 04/11/2022: Her wound is nearly healed. It is clean without concern for  infection. 04/18/2022: Her wound has healed. READMISSION 01/26/2023 About 8 weeks ago, she sustained another scratch from her Beagle on her left anterior tibial surface.. She has been trying to treat the wound at home with Vaseline. There is thick slough buildup on the wound surface and the periwound skin looks a bit macerated. Elaine overt sign of infection. 01/29/2023: She came in for an unscheduled visit today because her wound began bleeding. It apparently bled quite profusely and her husband used an over-the- counter topical agent to get it to stop. On inspection today, he managed to achieve good hemostasis and there is Elaine ongoing blood loss. 02/03/2023: Elaine further  ANA, SCHMUHL ([id number]) [phone].pdf Page 1 of 11 Visit Report for 07/20/2023 Chief Complaint Document Details Patient Name: Date of Service: [name], Elaine RMA Long. 07/20/2023 10:45 A M Medical Record Number: [id number] Patient Account Number: [id number] Date of Birth/Sex: Treating RN: [birth year] (77 y.o. F) Primary Care Provider: Dina Rich Other Clinician: Referring Provider: Treating Provider/Extender: Pincus Badder in Treatment: 25 Information Obtained from: Patient Chief Complaint Patient seen for complaints of Non-Healing Wound. Electronic Signature(s) Signed: 07/20/2023 11:16:49 AM By: Duanne Guess MD FACS Entered By: Duanne Guess on 07/20/2023 08:16:48 -------------------------------------------------------------------------------- Debridement Details Patient Name: Date of Service: Elaine Hurst RMA Long. 07/20/2023 10:45 A M Medical Record Number: [id number] Patient Account Number: [id number] Date of Birth/Sex: Treating RN: 01-17-1946 (77 y.o. [name] Primary Care Provider: Dina Rich Other Clinician: Referring Provider: Treating Provider/Extender: Pincus Badder in Treatment: 25 Debridement Performed for Assessment: Wound #4 Left,Anterior Lower Leg Performed By: Physician Duanne Guess, MD The following information was scribed by: Samuella Bruin The information was scribed for: Duanne Guess Debridement Type: Debridement Severity of Tissue Pre Debridement: Fat layer exposed Level of Consciousness (Pre-procedure): Awake and Alert Pre-procedure Verification/Time Out Yes - 10:55 Taken: Start Time: 10:55 Pain Control: Lidocaine 4% Topical Solution Percent of Wound Bed Debrided: 80% T Area Debrided (cm): otal 5.65 Tissue and other material debrided: Non-Viable, Eschar, Slough, Slough Level: Non-Viable Tissue Debridement Description: Selective/Open Wound Instrument:  Curette Bleeding: Minimum Hemostasis Achieved: Pressure Response to Treatment: Procedure was tolerated well Level of Consciousness (Post- Awake and Alert procedure): Post Debridement Measurements of Total Wound Length: (cm) 3 Width: (cm) 3 Depth: (cm) 0.1 Volume: (cm) 0.707 Character of Wound/Ulcer Post Debridement: Improved Severity of Tissue Post Debridement: Fat layer exposed Potash, [name] ([id number]) [id number] Page 2 of 11 Post Procedure Diagnosis Same as Pre-procedure Electronic Signature(s) Signed: 07/20/2023 12:36:43 PM By: Duanne Guess MD FACS Signed: 07/20/2023 4:30:21 PM By: Gelene Mink By: Samuella Bruin on 07/20/2023 07:59:12 -------------------------------------------------------------------------------- HPI Details Patient Name: Date of Service: Elaine Hurst RMA Long. 07/20/2023 10:45 A M Medical Record Number: [id number] Patient Account Number: [id number] Date of Birth/Sex: Treating RN: 12/20/1945 (77 y.o. F) Primary Care Provider: Dina Rich Other Clinician: Referring Provider: Treating Provider/Extender: Pincus Badder in Treatment: 25 History of Present Illness HPI Description: ADMISSION 02/19/2022 This is a 77 year old woman with minimal pertinent medical history. She does have COPD and pulmonary hypertension, but is not diabetic and is not a smoker. About 6 months ago, she and her husband got a new beagle puppy. The puppy scratched her leg and the scratches ultimately deteriorated into ulcerations. Apparently she sought care with a dermatologist who recommended applying a one-to-one mixture of peroxide and water to the wounds followed by a thick layer of Vaseline. She continue this for some time but then saw her primary care provider who told her to discontinue the peroxide. She has not been on any antibiotics for the scratches. Today, there are 3 separate wounds on her anterior tibial surface  on the left. They are tender and her leg has localized swelling. There is yellow slough buildup in each of the wound bases. ABI in clinic today was normal at 1.16. 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 02/25/2022: Over the past week, the wounds themselves have not improved tremendously but she has noticed some stinging and the periwound skin is quite inflamed. I took a culture last week that had low levels of methicillin-resistant Staph aureus. Because it  of skin breakdown in the distribution of the bandage. This is also caused some breakdown of the existing wound. In addition, when she was at a wedding this weekend, she fell and suffered a large skin tear to her proximal left lower leg and left knee. The fat layer is exposed. There is slough accumulation on the surfaces. 04/13/2023: All of the wounds have deteriorated. They are red and angry-looking. There is slough on all of the surfaces. 04/20/2023: There has been significant improvement since last week. The erythema and irritation have resolved. There is minimal slough on the wound surfaces along with a little bit of light periwound eschar. She continues on Keflex. 04/27/2023: Continued improvement of her wounds. All of them are smaller. The knee wound is nearly closed. There is slough and eschar on all of the wound surfaces. She has completed her course of oral antibiotics. 05/04/2023: The wounds are all smaller today with slough and eschar present on all of the surfaces. The moisture balance is leaning a bit towards the dry side, however. 05/12/2023: The wound on her knee is healed. The other skin tear at the tibial tuberosity is nearly closed with just a small open area. The lower leg wounds are about the same size. Moisture balance is better. There is slough on all of the open wound surfaces. 05/20/2023: The wounds measured a little bit smaller today. They have slough accumulation and the patient says everything is quite a bit more tender. There is more periwound erythema present today. 05/29/2023: The skin tears on her upper leg have healed. The lower wounds are all smaller with just some thin slough on the surface. Her tenderness  and erythema have resolved. She completed her course of Keflex yesterday. 06/04/2023: All of the lower leg wounds are smaller again today. There is Elaine erythema surrounding the wounds. Light slough on the surfaces. 06/18/2023: The lower leg wounds are stable. There is some slough on the surfaces. Both the patient and her husband are a little bit frustrated that she has not made more rapid improvement. 07/02/2023: Her wounds look better this week. The periwound erythema and irritation that have typically been present are not there today. There is slough on the wound surfaces. The small wound on the plantar surface of her great toe has contracted, as well. Very thin biofilm on the surface here. 07/20/2023: The wound on the plantar surface of her great toe is healed. Although the wound measurements were apparently the same, they appear smaller to me visually. There is slough and eschar accumulation. Elaine periwound erythema. Electronic Signature(s) Signed: 07/20/2023 11:17:41 AM By: Duanne Guess MD FACS Entered By: Duanne Guess on 07/20/2023 08:17:41 -------------------------------------------------------------------------------- Physical Exam Details Patient Name: Date of Service: Elaine Hurst RMA Long. 07/20/2023 10:45 A M Medical Record Number: [id number] Patient Account Number: [id number] Date of Birth/Sex: Treating RN: 04-Jul-1946 (77 y.o. F) Primary Care Provider: Dina Rich Other Clinician: Referring Provider: Treating Provider/Extender: Pincus Badder in Treatment: 25 Constitutional .Tachycardic, asymptomatic. . . Elaine acute distress. Respiratory Normal work of breathing on room air. Notes 07/20/2023: The wound on the plantar surface of her great toe is healed. Although the wound measurements were apparently the same, they appear smaller to me visually. There is slough and eschar accumulation. Elaine periwound erythema. Electronic Signature(s) Signed: 07/20/2023 11:18:41 AM  By: Duanne Guess MD FACS Entered By: Duanne Guess on 07/20/2023 08:18:41 [name] ([id number]) [id number] Page 4 of 11 -------------------------------------------------------------------------------- Physician Orders Details Patient Name: Date of Service: [name],  Elaine RMA Long. 07/20/2023 10:45 A M Medical Record Number: [id number] Patient Account Number: [id number] Date of Birth/Sex: Treating RN: 09-07-1946 (77 y.o. [name] Primary Care Provider: Dina Rich Other Clinician: Referring Provider: Treating Provider/Extender: Pincus Badder in Treatment: 25 The following information was scribed by: Samuella Bruin The information was scribed for: Duanne Guess Verbal / Phone Orders: Elaine Diagnosis Coding ICD-10 Coding Code Description [phone] Non-pressure chronic ulcer of other part of left lower leg with fat layer exposed I27.20 Pulmonary hypertension, unspecified I51.9 Heart disease, unspecified Follow-up Appointments Return appointment in 3 weeks. - Dr. Lady Gary - room 2 Anesthetic (In clinic) Topical Lidocaine 4% applied to wound bed Bathing/ Shower/ Hygiene May shower and wash wound with soap and water. - with dressing changes Edema Control - Lymphedema / SCD / Other Elevate legs to the level of the heart or above for 30 minutes daily and/or when sitting for 3-4 times a day throughout the day. A void standing for long periods of time. Exercise regularly If compression wraps slide down please call wound center and speak with a nurse. Wound Treatment Wound #4 - Lower Leg Wound Laterality: Left, Anterior Cleanser: Soap and Water Every Other Day/30 Days Discharge Instructions: May shower and wash wound with dial antibacterial soap and water prior to dressing change. Cleanser: Vashe 5.8 (oz) Every Other Day/30 Days Discharge Instructions: Cleanse the wound with Vashe prior to applying a clean dressing using  gauze sponges, not tissue or cotton balls. Peri-Wound Care: Triamcinolone 15 (g) Every Other Day/30 Days Discharge Instructions: Use triamcinolone 15 (g) as directed Peri-Wound Care: Sween Lotion (Moisturizing lotion) Every Other Day/30 Days Discharge Instructions: Apply moisturizing lotion as directed Topical: Gentamicin Every Other Day/30 Days Discharge Instructions: As directed by physician Topical: Mupirocin Ointment Every Other Day/30 Days Discharge Instructions: Apply Mupirocin (Bactroban) as instructed Secondary Dressing: T Non-adherent Dressing, 2x3 in Every Other Day/30 Days elfa Discharge Instructions: Apply over primary dressing as directed. Compression Wrap: Kerlix Roll 4.5x3.1 (in/yd) Every Other Day/30 Days Discharge Instructions: Apply Kerlix and Coban compression as directed. Compression Wrap: Coban Self-Adherent Wrap 4x5 (in/yd) Every Other Day/30 Days Discharge Instructions: Apply over Kerlix as directed. Patient Medications llergies: Sulfa (Sulfonamide Antibiotics), adhesive, amoxicillin, ciprofloxacin, Statins-Hmg-Coa Reductase Inhibitors A Notifications Medication Indication Start End [name], [name] ([id number]) [id number] Page 5 of 11 07/20/2023 lidocaine DOSE topical 4 % cream - cream topical Electronic Signature(s) Signed: 07/20/2023 12:36:43 PM By: Duanne Guess MD FACS Entered By: Duanne Guess on 07/20/2023 08:18:53 -------------------------------------------------------------------------------- Problem List Details Patient Name: Date of Service: Elaine Hurst RMA Long. 07/20/2023 10:45 A M Medical Record Number: [id number] Patient Account Number: [id number] Date of Birth/Sex: Treating RN: 1945-11-24 (77 y.o. F) Primary Care Provider: Dina Rich Other Clinician: Referring Provider: Treating Provider/Extender: Pincus Badder in Treatment: 25 Active Problems ICD-10 Encounter Code Description Active Date  MDM Diagnosis [id number] Non-pressure chronic ulcer of other part of left lower leg with fat layer exposed4/10/2022 Elaine Yes I27.20 Pulmonary hypertension, unspecified 01/26/2023 Elaine Yes I51.9 Heart disease, unspecified 01/26/2023 Elaine Yes Inactive Problems Resolved Problems ICD-10 Code Description Active Date Resolved Date L97.122 Non-pressure chronic ulcer of left thigh with fat layer exposed 04/06/2023 04/06/2023 L97.522 Non-pressure chronic ulcer of other part of left foot with fat layer exposed 07/02/2023 07/02/2023 Electronic Signature(s) Signed: 07/20/2023 11:14:43 AM By: Duanne Guess MD FACS Entered By: Duanne Guess on 07/20/2023 08:14:43 -------------------------------------------------------------------------------- Progress Note Details Patient Name: Date of Service: [name], Elaine RMA Long. 07/20/2023 10:45 A M Medical Record  ANA, SCHMUHL ([id number]) [phone].pdf Page 1 of 11 Visit Report for 07/20/2023 Chief Complaint Document Details Patient Name: Date of Service: [name], Elaine RMA Long. 07/20/2023 10:45 A M Medical Record Number: [id number] Patient Account Number: [id number] Date of Birth/Sex: Treating RN: [birth year] (77 y.o. F) Primary Care Provider: Dina Rich Other Clinician: Referring Provider: Treating Provider/Extender: Pincus Badder in Treatment: 25 Information Obtained from: Patient Chief Complaint Patient seen for complaints of Non-Healing Wound. Electronic Signature(s) Signed: 07/20/2023 11:16:49 AM By: Duanne Guess MD FACS Entered By: Duanne Guess on 07/20/2023 08:16:48 -------------------------------------------------------------------------------- Debridement Details Patient Name: Date of Service: Elaine Hurst RMA Long. 07/20/2023 10:45 A M Medical Record Number: [id number] Patient Account Number: [id number] Date of Birth/Sex: Treating RN: 01-17-1946 (77 y.o. [name] Primary Care Provider: Dina Rich Other Clinician: Referring Provider: Treating Provider/Extender: Pincus Badder in Treatment: 25 Debridement Performed for Assessment: Wound #4 Left,Anterior Lower Leg Performed By: Physician Duanne Guess, MD The following information was scribed by: Samuella Bruin The information was scribed for: Duanne Guess Debridement Type: Debridement Severity of Tissue Pre Debridement: Fat layer exposed Level of Consciousness (Pre-procedure): Awake and Alert Pre-procedure Verification/Time Out Yes - 10:55 Taken: Start Time: 10:55 Pain Control: Lidocaine 4% Topical Solution Percent of Wound Bed Debrided: 80% T Area Debrided (cm): otal 5.65 Tissue and other material debrided: Non-Viable, Eschar, Slough, Slough Level: Non-Viable Tissue Debridement Description: Selective/Open Wound Instrument:  Curette Bleeding: Minimum Hemostasis Achieved: Pressure Response to Treatment: Procedure was tolerated well Level of Consciousness (Post- Awake and Alert procedure): Post Debridement Measurements of Total Wound Length: (cm) 3 Width: (cm) 3 Depth: (cm) 0.1 Volume: (cm) 0.707 Character of Wound/Ulcer Post Debridement: Improved Severity of Tissue Post Debridement: Fat layer exposed Potash, [name] ([id number]) [id number] Page 2 of 11 Post Procedure Diagnosis Same as Pre-procedure Electronic Signature(s) Signed: 07/20/2023 12:36:43 PM By: Duanne Guess MD FACS Signed: 07/20/2023 4:30:21 PM By: Gelene Mink By: Samuella Bruin on 07/20/2023 07:59:12 -------------------------------------------------------------------------------- HPI Details Patient Name: Date of Service: Elaine Hurst RMA Long. 07/20/2023 10:45 A M Medical Record Number: [id number] Patient Account Number: [id number] Date of Birth/Sex: Treating RN: 12/20/1945 (77 y.o. F) Primary Care Provider: Dina Rich Other Clinician: Referring Provider: Treating Provider/Extender: Pincus Badder in Treatment: 25 History of Present Illness HPI Description: ADMISSION 02/19/2022 This is a 77 year old woman with minimal pertinent medical history. She does have COPD and pulmonary hypertension, but is not diabetic and is not a smoker. About 6 months ago, she and her husband got a new beagle puppy. The puppy scratched her leg and the scratches ultimately deteriorated into ulcerations. Apparently she sought care with a dermatologist who recommended applying a one-to-one mixture of peroxide and water to the wounds followed by a thick layer of Vaseline. She continue this for some time but then saw her primary care provider who told her to discontinue the peroxide. She has not been on any antibiotics for the scratches. Today, there are 3 separate wounds on her anterior tibial surface  on the left. They are tender and her leg has localized swelling. There is yellow slough buildup in each of the wound bases. ABI in clinic today was normal at 1.16. 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 02/25/2022: Over the past week, the wounds themselves have not improved tremendously but she has noticed some stinging and the periwound skin is quite inflamed. I took a culture last week that had low levels of methicillin-resistant Staph aureus. Because it  of skin breakdown in the distribution of the bandage. This is also caused some breakdown of the existing wound. In addition, when she was at a wedding this weekend, she fell and suffered a large skin tear to her proximal left lower leg and left knee. The fat layer is exposed. There is slough accumulation on the surfaces. 04/13/2023: All of the wounds have deteriorated. They are red and angry-looking. There is slough on all of the surfaces. 04/20/2023: There has been significant improvement since last week. The erythema and irritation have resolved. There is minimal slough on the wound surfaces along with a little bit of light periwound eschar. She continues on Keflex. 04/27/2023: Continued improvement of her wounds. All of them are smaller. The knee wound is nearly closed. There is slough and eschar on all of the wound surfaces. She has completed her course of oral antibiotics. 05/04/2023: The wounds are all smaller today with slough and eschar present on all of the surfaces. The moisture balance is leaning a bit towards the dry side, however. 05/12/2023: The wound on her knee is healed. The other skin tear at the tibial tuberosity is nearly closed with just a small open area. The lower leg wounds are about the same size. Moisture balance is better. There is slough on all of the open wound surfaces. 05/20/2023: The wounds measured a little bit smaller today. They have slough accumulation and the patient says everything is quite a bit more tender. There is more periwound erythema present today. 05/29/2023: The skin tears on her upper leg have healed. The lower wounds are all smaller with just some thin slough on the surface. Her tenderness  and erythema have resolved. She completed her course of Keflex yesterday. 06/04/2023: All of the lower leg wounds are smaller again today. There is Elaine erythema surrounding the wounds. Light slough on the surfaces. 06/18/2023: The lower leg wounds are stable. There is some slough on the surfaces. Both the patient and her husband are a little bit frustrated that she has not made more rapid improvement. 07/02/2023: Her wounds look better this week. The periwound erythema and irritation that have typically been present are not there today. There is slough on the wound surfaces. The small wound on the plantar surface of her great toe has contracted, as well. Very thin biofilm on the surface here. 07/20/2023: The wound on the plantar surface of her great toe is healed. Although the wound measurements were apparently the same, they appear smaller to me visually. There is slough and eschar accumulation. Elaine periwound erythema. Electronic Signature(s) Signed: 07/20/2023 11:17:41 AM By: Duanne Guess MD FACS Entered By: Duanne Guess on 07/20/2023 08:17:41 -------------------------------------------------------------------------------- Physical Exam Details Patient Name: Date of Service: Elaine Hurst RMA Long. 07/20/2023 10:45 A M Medical Record Number: [id number] Patient Account Number: [id number] Date of Birth/Sex: Treating RN: 04-Jul-1946 (77 y.o. F) Primary Care Provider: Dina Rich Other Clinician: Referring Provider: Treating Provider/Extender: Pincus Badder in Treatment: 25 Constitutional .Tachycardic, asymptomatic. . . Elaine acute distress. Respiratory Normal work of breathing on room air. Notes 07/20/2023: The wound on the plantar surface of her great toe is healed. Although the wound measurements were apparently the same, they appear smaller to me visually. There is slough and eschar accumulation. Elaine periwound erythema. Electronic Signature(s) Signed: 07/20/2023 11:18:41 AM  By: Duanne Guess MD FACS Entered By: Duanne Guess on 07/20/2023 08:18:41 [name] ([id number]) [id number] Page 4 of 11 -------------------------------------------------------------------------------- Physician Orders Details Patient Name: Date of Service: [name],  of skin breakdown in the distribution of the bandage. This is also caused some breakdown of the existing wound. In addition, when she was at a wedding this weekend, she fell and suffered a large skin tear to her proximal left lower leg and left knee. The fat layer is exposed. There is slough accumulation on the surfaces. 04/13/2023: All of the wounds have deteriorated. They are red and angry-looking. There is slough on all of the surfaces. 04/20/2023: There has been significant improvement since last week. The erythema and irritation have resolved. There is minimal slough on the wound surfaces along with a little bit of light periwound eschar. She continues on Keflex. 04/27/2023: Continued improvement of her wounds. All of them are smaller. The knee wound is nearly closed. There is slough and eschar on all of the wound surfaces. She has completed her course of oral antibiotics. 05/04/2023: The wounds are all smaller today with slough and eschar present on all of the surfaces. The moisture balance is leaning a bit towards the dry side, however. 05/12/2023: The wound on her knee is healed. The other skin tear at the tibial tuberosity is nearly closed with just a small open area. The lower leg wounds are about the same size. Moisture balance is better. There is slough on all of the open wound surfaces. 05/20/2023: The wounds measured a little bit smaller today. They have slough accumulation and the patient says everything is quite a bit more tender. There is more periwound erythema present today. 05/29/2023: The skin tears on her upper leg have healed. The lower wounds are all smaller with just some thin slough on the surface. Her tenderness  and erythema have resolved. She completed her course of Keflex yesterday. 06/04/2023: All of the lower leg wounds are smaller again today. There is Elaine erythema surrounding the wounds. Light slough on the surfaces. 06/18/2023: The lower leg wounds are stable. There is some slough on the surfaces. Both the patient and her husband are a little bit frustrated that she has not made more rapid improvement. 07/02/2023: Her wounds look better this week. The periwound erythema and irritation that have typically been present are not there today. There is slough on the wound surfaces. The small wound on the plantar surface of her great toe has contracted, as well. Very thin biofilm on the surface here. 07/20/2023: The wound on the plantar surface of her great toe is healed. Although the wound measurements were apparently the same, they appear smaller to me visually. There is slough and eschar accumulation. Elaine periwound erythema. Electronic Signature(s) Signed: 07/20/2023 11:17:41 AM By: Duanne Guess MD FACS Entered By: Duanne Guess on 07/20/2023 08:17:41 -------------------------------------------------------------------------------- Physical Exam Details Patient Name: Date of Service: Elaine Hurst RMA Long. 07/20/2023 10:45 A M Medical Record Number: [id number] Patient Account Number: [id number] Date of Birth/Sex: Treating RN: 04-Jul-1946 (77 y.o. F) Primary Care Provider: Dina Rich Other Clinician: Referring Provider: Treating Provider/Extender: Pincus Badder in Treatment: 25 Constitutional .Tachycardic, asymptomatic. . . Elaine acute distress. Respiratory Normal work of breathing on room air. Notes 07/20/2023: The wound on the plantar surface of her great toe is healed. Although the wound measurements were apparently the same, they appear smaller to me visually. There is slough and eschar accumulation. Elaine periwound erythema. Electronic Signature(s) Signed: 07/20/2023 11:18:41 AM  By: Duanne Guess MD FACS Entered By: Duanne Guess on 07/20/2023 08:18:41 [name] ([id number]) [id number] Page 4 of 11 -------------------------------------------------------------------------------- Physician Orders Details Patient Name: Date of Service: [name],  Elaine RMA Long. 07/20/2023 10:45 A M Medical Record Number: [id number] Patient Account Number: [id number] Date of Birth/Sex: Treating RN: 09-07-1946 (77 y.o. [name] Primary Care Provider: Dina Rich Other Clinician: Referring Provider: Treating Provider/Extender: Pincus Badder in Treatment: 25 The following information was scribed by: Samuella Bruin The information was scribed for: Duanne Guess Verbal / Phone Orders: Elaine Diagnosis Coding ICD-10 Coding Code Description [phone] Non-pressure chronic ulcer of other part of left lower leg with fat layer exposed I27.20 Pulmonary hypertension, unspecified I51.9 Heart disease, unspecified Follow-up Appointments Return appointment in 3 weeks. - Dr. Lady Gary - room 2 Anesthetic (In clinic) Topical Lidocaine 4% applied to wound bed Bathing/ Shower/ Hygiene May shower and wash wound with soap and water. - with dressing changes Edema Control - Lymphedema / SCD / Other Elevate legs to the level of the heart or above for 30 minutes daily and/or when sitting for 3-4 times a day throughout the day. A void standing for long periods of time. Exercise regularly If compression wraps slide down please call wound center and speak with a nurse. Wound Treatment Wound #4 - Lower Leg Wound Laterality: Left, Anterior Cleanser: Soap and Water Every Other Day/30 Days Discharge Instructions: May shower and wash wound with dial antibacterial soap and water prior to dressing change. Cleanser: Vashe 5.8 (oz) Every Other Day/30 Days Discharge Instructions: Cleanse the wound with Vashe prior to applying a clean dressing using  gauze sponges, not tissue or cotton balls. Peri-Wound Care: Triamcinolone 15 (g) Every Other Day/30 Days Discharge Instructions: Use triamcinolone 15 (g) as directed Peri-Wound Care: Sween Lotion (Moisturizing lotion) Every Other Day/30 Days Discharge Instructions: Apply moisturizing lotion as directed Topical: Gentamicin Every Other Day/30 Days Discharge Instructions: As directed by physician Topical: Mupirocin Ointment Every Other Day/30 Days Discharge Instructions: Apply Mupirocin (Bactroban) as instructed Secondary Dressing: T Non-adherent Dressing, 2x3 in Every Other Day/30 Days elfa Discharge Instructions: Apply over primary dressing as directed. Compression Wrap: Kerlix Roll 4.5x3.1 (in/yd) Every Other Day/30 Days Discharge Instructions: Apply Kerlix and Coban compression as directed. Compression Wrap: Coban Self-Adherent Wrap 4x5 (in/yd) Every Other Day/30 Days Discharge Instructions: Apply over Kerlix as directed. Patient Medications llergies: Sulfa (Sulfonamide Antibiotics), adhesive, amoxicillin, ciprofloxacin, Statins-Hmg-Coa Reductase Inhibitors A Notifications Medication Indication Start End [name], [name] ([id number]) [id number] Page 5 of 11 07/20/2023 lidocaine DOSE topical 4 % cream - cream topical Electronic Signature(s) Signed: 07/20/2023 12:36:43 PM By: Duanne Guess MD FACS Entered By: Duanne Guess on 07/20/2023 08:18:53 -------------------------------------------------------------------------------- Problem List Details Patient Name: Date of Service: Elaine Hurst RMA Long. 07/20/2023 10:45 A M Medical Record Number: [id number] Patient Account Number: [id number] Date of Birth/Sex: Treating RN: 1945-11-24 (77 y.o. F) Primary Care Provider: Dina Rich Other Clinician: Referring Provider: Treating Provider/Extender: Pincus Badder in Treatment: 25 Active Problems ICD-10 Encounter Code Description Active Date  MDM Diagnosis [id number] Non-pressure chronic ulcer of other part of left lower leg with fat layer exposed4/10/2022 Elaine Yes I27.20 Pulmonary hypertension, unspecified 01/26/2023 Elaine Yes I51.9 Heart disease, unspecified 01/26/2023 Elaine Yes Inactive Problems Resolved Problems ICD-10 Code Description Active Date Resolved Date L97.122 Non-pressure chronic ulcer of left thigh with fat layer exposed 04/06/2023 04/06/2023 L97.522 Non-pressure chronic ulcer of other part of left foot with fat layer exposed 07/02/2023 07/02/2023 Electronic Signature(s) Signed: 07/20/2023 11:14:43 AM By: Duanne Guess MD FACS Entered By: Duanne Guess on 07/20/2023 08:14:43 -------------------------------------------------------------------------------- Progress Note Details Patient Name: Date of Service: [name], Elaine RMA Long. 07/20/2023 10:45 A M Medical Record

## 2023-07-20 NOTE — Progress Notes (Signed)
Tendon Exposed: No Muscle Exposed: No Joint Exposed: No Bone Exposed: No Periwound Skin Texture Texture Color No Abnormalities Noted: Yes No Abnormalities Noted: No Atrophie Blanche: No Moisture Cyanosis: No No Abnormalities Noted: Yes Ecchymosis: No Erythema: No Hemosiderin Staining: Yes Mottled: No Pallor: No Rubor: No Temperature / Pain Temperature: No Abnormality Treatment Notes Wound #4 (Lower Leg) Wound Laterality: Left, Anterior Cleanser Soap and Water Discharge Instruction: May shower and wash wound with dial antibacterial soap and water prior to dressing change. Vashe 5.8 (oz) Discharge Instruction: Cleanse the wound with Vashe prior to applying a clean dressing using gauze sponges, not tissue or cotton balls. Peri-Wound Care Triamcinolone 15 (g) Discharge Instruction: Use triamcinolone 15 (g) as directed Sween Lotion (Moisturizing lotion) Discharge Instruction: Apply moisturizing lotion as directed Topical Gentamicin Discharge Instruction: As directed by physician Mupirocin Ointment Discharge Instruction: Apply Mupirocin (Bactroban) as instructed ASTA, STEVENSON ([id number]) [id number] Page 8 of 9 Primary Dressing Secondary Dressing T Non-adherent Dressing, 2x3 in elfa Discharge Instruction: Apply over primary dressing as directed. Secured  With Compression Wrap Kerlix Roll 4.5x3.1 (in/yd) Discharge Instruction: Apply Kerlix and Coban compression as directed. Coban Self-Adherent Wrap 4x5 (in/yd) Discharge Instruction: Apply over Kerlix as directed. Compression Stockings Add-Ons Electronic Signature(s) Signed: 07/20/2023 4:30:21 PM By: Samuella Bruin Entered By: Samuella Bruin on 07/20/2023 07:50:57 -------------------------------------------------------------------------------- Wound Assessment Details Patient Name: Date of Service: [name] RMA Long. 07/20/2023 10:45 A M Medical Record Number: [id number] Patient Account Number: [id number] Date of Birth/Sex: Treating RN: October 21, 1946 (77 y.o. [name] Primary Care Tron Flythe: Dina Rich Other Clinician: Referring [name]: Treating Nelsy Madonna/Extender: Pincus Badder in Treatment: 25 Wound Status Wound Number: 7 Primary Abrasion Etiology: Wound Location: Plantar T Great oe Wound Healed - Epithelialized Wounding Event: Blister Status: Date Acquired: 06/18/2023 Comorbid Cataracts, Anemia, Chronic Obstructive Pulmonary Disease Weeks Of Treatment: 4 History: (COPD), Coronary Artery Disease, Peripheral Venous Disease, Clustered Wound: No Raynauds, Rheumatoid Arthritis, Osteoarthritis Photos Wound Measurements Length: (cm) Width: (cm) Depth: (cm) Area: (cm) Volume: (cm) 0 % Reduction in Area: 100% 0 % Reduction in Volume: 100% 0 Epithelialization: None 0 Tunneling: No 0 Undermining: No Wound Description Classification: Full Thickness Without Exposed Support Structures Wound Margin: Distinct, outline attached Exudate Amount: None Present Foul Odor After Cleansing: No Slough/Fibrino No Wound Bed [name], [name] ([id number]) [id number].pdf Page 9 of 9 Granulation Amount: None Present (0%) Exposed Structure Necrotic Amount: None Present (0%) Fascia Exposed: No Fat Layer (Subcutaneous Tissue) Exposed:  No Tendon Exposed: No Muscle Exposed: No Joint Exposed: No Bone Exposed: No Periwound Skin Texture Texture Color No Abnormalities Noted: No No Abnormalities Noted: Yes Callus: Yes Temperature / Pain Temperature: No Abnormality Moisture No Abnormalities Noted: Yes Electronic Signature(s) Signed: 07/20/2023 4:30:21 PM By: Samuella Bruin Entered By: Samuella Bruin on 07/20/2023 08:00:43 -------------------------------------------------------------------------------- Vitals Details Patient Name: Date of Service: [name] RMA Long. 07/20/2023 10:45 A M Medical Record Number: [id number] Patient Account Number: [id number] Date of Birth/Sex: Treating RN: 1946/07/28 (77 y.o. [name] Primary Care Cassidee Deats: Dina Rich Other Clinician: Referring Rhylan Gross: Treating Coe Angelos/Extender: Pincus Badder in Treatment: 25 Vital Signs Time Taken: 10:41 Temperature (F): 98.1 Height (in): 63 Pulse (bpm): 116 Weight (lbs): 115 Respiratory Rate (breaths/min): 16 Body Mass Index (BMI): 20.4 Blood Pressure (mmHg): 134/74 Reference Range: 80 - 120 mg / dl Electronic Signature(s) Signed: 07/20/2023 4:30:21 PM By: Samuella Bruin Entered By: Samuella Bruin on 07/20/2023 07:42:04  [name], [name] ([id number]) [id number].pdf Page 1 of 9 Visit Report for 07/20/2023 Arrival Information Details Patient Name: Date of Service: [name], [name] RMA Long. 07/20/2023 10:45 A M Medical Record Number: [id number] Patient Account Number: [id number] Date of Birth/Sex: Treating RN: [birth year] (77 y.o. [name] Primary Care Kortnee Bas: Dina Rich Other Clinician: Referring Teshara Moree: Treating Gracielynn Birkel/Extender: Pincus Badder in Treatment: 25 Visit Information History Since Last Visit Added or deleted any medications: No Patient Arrived: [name] Any new allergies or adverse reactions: No Arrival Time: 10:36 Had a fall or experienced change in No Accompanied By: husband activities of daily living that may affect Transfer Assistance: None risk of falls: Patient Identification Verified: Yes Signs or symptoms of abuse/neglect since last visito No Secondary Verification Process Completed: Yes Hospitalized since last visit: No Patient Requires Transmission-Based Precautions: No Implantable device outside of the clinic excluding No Patient Has Alerts: Yes cellular tissue based products placed in the center Patient Alerts: ABI L 1.16 02/19/22 since last visit: Has Dressing in Place as Prescribed: Yes Has Compression in Place as Prescribed: Yes Pain Present Now: Yes Electronic Signature(s) Signed: 07/20/2023 4:30:21 PM By: Samuella Bruin Entered By: Samuella Bruin on 07/20/2023 07:42:30 -------------------------------------------------------------------------------- Encounter Discharge Information Details Patient Name: Date of Service: [name] RMA Long. 07/20/2023 10:45 A M Medical Record Number: [id number] Patient Account Number: [id number] Date of Birth/Sex: Treating RN: May 29, 1946 (77 y.o. [name] Primary Care Sidonia Nutter: Dina Rich Other Clinician: Referring Zenda Herskowitz: Treating Korrine Sicard/Extender: Pincus Badder in Treatment: 25 Encounter Discharge Information Items Post Procedure Vitals Discharge Condition: Stable Temperature (F): 98.1 Ambulatory Status: Cane Pulse (bpm): 116 Discharge Destination: Home Respiratory Rate (breaths/min): 16 Transportation: Private Auto Blood Pressure (mmHg): 134/74 Accompanied By: husband Schedule Follow-up Appointment: Yes Clinical Summary of Care: Patient Declined Electronic Signature(s) Signed: 07/20/2023 4:30:21 PM By: Samuella Bruin Entered By: Samuella Bruin on 07/20/2023 08:29:37 [place], [name] ([id number]) [id number] Page 2 of 9 -------------------------------------------------------------------------------- Lower Extremity Assessment Details Patient Name: Date of Service: [name], [name] RMA Long. 07/20/2023 10:45 A M Medical Record Number: [id number] Patient Account Number: [id number] Date of Birth/Sex: Treating RN: [birth year] (77 y.o. [name] Primary Care Ashad Fawbush: Dina Rich Other Clinician: Referring Christain Niznik: Treating Verble Styron/Extender: Pincus Badder in Treatment: 25 Edema Assessment Assessed: [name]: No] [name]: No] Edema: [Left: N] [Right: o] Calf Left: Right: Point of Measurement: 25 cm From Medial Instep 30 cm Ankle Left: Right: Point of Measurement: 10 cm From Medial Instep 17.8 cm Vascular Assessment Pulses: Dorsalis Pedis Palpable: [Left:Yes] Extremity colors, hair growth, and conditions: Extremity Color: [Left:Hyperpigmented] Hair Growth on Extremity: [Left:No] Temperature of Extremity: [Left:Warm < 3 seconds] Electronic Signature(s) Signed: 07/20/2023 4:30:21 PM By: Samuella Bruin Entered By: Samuella Bruin on 07/20/2023 07:44:51 -------------------------------------------------------------------------------- Multi Wound Chart Details Patient Name: Date of Service: [name] RMA Long. 07/20/2023 10:45 A M Medical Record Number:  [id number] Patient Account Number: [id number] Date of Birth/Sex: Treating RN: 1945-12-20 (76 y.o. F) Primary Care Keniesha Adderly: Dina Rich Other Clinician: Referring Meryl Hubers: Treating Razia Screws/Extender: Pincus Badder in Treatment: 25 Vital Signs Height(in): 63 Pulse(bpm): 116 Weight(lbs): 115 Blood Pressure(mmHg): 134/74 Body Mass Index(BMI): 20.4 Temperature(F): 98.1 Respiratory Rate(breaths/min): 16 [4:Photos:] [N/A:N/A [id number].pdf Page 3 of 9] Left, Anterior Lower Leg Plantar T Great oe N/A Wound Location: Trauma Blister N/A Wounding Event: Venous Leg Ulcer Abrasion N/A Primary Etiology: Cataracts, Anemia, Chronic Cataracts, Anemia, Chronic N/A Comorbid History: Obstructive Pulmonary Disease Obstructive Pulmonary Disease (COPD), Coronary Artery Disease, (COPD), Coronary Artery  Tendon Exposed: No Muscle Exposed: No Joint Exposed: No Bone Exposed: No Periwound Skin Texture Texture Color No Abnormalities Noted: Yes No Abnormalities Noted: No Atrophie Blanche: No Moisture Cyanosis: No No Abnormalities Noted: Yes Ecchymosis: No Erythema: No Hemosiderin Staining: Yes Mottled: No Pallor: No Rubor: No Temperature / Pain Temperature: No Abnormality Treatment Notes Wound #4 (Lower Leg) Wound Laterality: Left, Anterior Cleanser Soap and Water Discharge Instruction: May shower and wash wound with dial antibacterial soap and water prior to dressing change. Vashe 5.8 (oz) Discharge Instruction: Cleanse the wound with Vashe prior to applying a clean dressing using gauze sponges, not tissue or cotton balls. Peri-Wound Care Triamcinolone 15 (g) Discharge Instruction: Use triamcinolone 15 (g) as directed Sween Lotion (Moisturizing lotion) Discharge Instruction: Apply moisturizing lotion as directed Topical Gentamicin Discharge Instruction: As directed by physician Mupirocin Ointment Discharge Instruction: Apply Mupirocin (Bactroban) as instructed ASTA, STEVENSON ([id number]) [id number] Page 8 of 9 Primary Dressing Secondary Dressing T Non-adherent Dressing, 2x3 in elfa Discharge Instruction: Apply over primary dressing as directed. Secured  With Compression Wrap Kerlix Roll 4.5x3.1 (in/yd) Discharge Instruction: Apply Kerlix and Coban compression as directed. Coban Self-Adherent Wrap 4x5 (in/yd) Discharge Instruction: Apply over Kerlix as directed. Compression Stockings Add-Ons Electronic Signature(s) Signed: 07/20/2023 4:30:21 PM By: Samuella Bruin Entered By: Samuella Bruin on 07/20/2023 07:50:57 -------------------------------------------------------------------------------- Wound Assessment Details Patient Name: Date of Service: [name] RMA Long. 07/20/2023 10:45 A M Medical Record Number: [id number] Patient Account Number: [id number] Date of Birth/Sex: Treating RN: October 21, 1946 (77 y.o. [name] Primary Care Tron Flythe: Dina Rich Other Clinician: Referring [name]: Treating Nelsy Madonna/Extender: Pincus Badder in Treatment: 25 Wound Status Wound Number: 7 Primary Abrasion Etiology: Wound Location: Plantar T Great oe Wound Healed - Epithelialized Wounding Event: Blister Status: Date Acquired: 06/18/2023 Comorbid Cataracts, Anemia, Chronic Obstructive Pulmonary Disease Weeks Of Treatment: 4 History: (COPD), Coronary Artery Disease, Peripheral Venous Disease, Clustered Wound: No Raynauds, Rheumatoid Arthritis, Osteoarthritis Photos Wound Measurements Length: (cm) Width: (cm) Depth: (cm) Area: (cm) Volume: (cm) 0 % Reduction in Area: 100% 0 % Reduction in Volume: 100% 0 Epithelialization: None 0 Tunneling: No 0 Undermining: No Wound Description Classification: Full Thickness Without Exposed Support Structures Wound Margin: Distinct, outline attached Exudate Amount: None Present Foul Odor After Cleansing: No Slough/Fibrino No Wound Bed [name], [name] ([id number]) [id number].pdf Page 9 of 9 Granulation Amount: None Present (0%) Exposed Structure Necrotic Amount: None Present (0%) Fascia Exposed: No Fat Layer (Subcutaneous Tissue) Exposed:  No Tendon Exposed: No Muscle Exposed: No Joint Exposed: No Bone Exposed: No Periwound Skin Texture Texture Color No Abnormalities Noted: No No Abnormalities Noted: Yes Callus: Yes Temperature / Pain Temperature: No Abnormality Moisture No Abnormalities Noted: Yes Electronic Signature(s) Signed: 07/20/2023 4:30:21 PM By: Samuella Bruin Entered By: Samuella Bruin on 07/20/2023 08:00:43 -------------------------------------------------------------------------------- Vitals Details Patient Name: Date of Service: [name] RMA Long. 07/20/2023 10:45 A M Medical Record Number: [id number] Patient Account Number: [id number] Date of Birth/Sex: Treating RN: 1946/07/28 (77 y.o. [name] Primary Care Cassidee Deats: Dina Rich Other Clinician: Referring Rhylan Gross: Treating Coe Angelos/Extender: Pincus Badder in Treatment: 25 Vital Signs Time Taken: 10:41 Temperature (F): 98.1 Height (in): 63 Pulse (bpm): 116 Weight (lbs): 115 Respiratory Rate (breaths/min): 16 Body Mass Index (BMI): 20.4 Blood Pressure (mmHg): 134/74 Reference Range: 80 - 120 mg / dl Electronic Signature(s) Signed: 07/20/2023 4:30:21 PM By: Samuella Bruin Entered By: Samuella Bruin on 07/20/2023 07:42:04  08/28/2023 Goal Status: Active Patient/caregiver will maintain therapeutic glucose control Date Initiated: 01/26/2023 Target Resolution Date: 08/28/2023 Goal Status: Active Interventions: Assess patient nutrition upon admission and as needed per policy Provide education on nutrition Treatment Activities: Giving encouragement to exercise : 01/26/2023 BRYAN, MOLDENHAUER ([id number]) [phone].pdf Page 5 of 9 Notes: Pain, Acute or  Chronic Nursing Diagnoses: Pain, acute or chronic: actual or potential Potential alteration in comfort, pain Goals: Patient will verbalize adequate pain control and receive pain control interventions during procedures as needed Date Initiated: 01/26/2023 Date Inactivated: 05/29/2023 Target Resolution Date: 05/29/2023 Goal Status: Met Patient/caregiver will verbalize comfort level met Date Initiated: 01/26/2023 Target Resolution Date: 08/28/2023 Goal Status: Active Interventions: Complete pain assessment as per visit requirements Encourage patient to take pain medications as prescribed Provide education on pain management Treatment Activities: Administer pain control measures as ordered : 01/26/2023 Notes: Electronic Signature(s) Signed: 07/20/2023 4:30:21 PM By: Samuella Bruin Entered By: Samuella Bruin on 07/20/2023 07:36:55 -------------------------------------------------------------------------------- Pain Assessment Details Patient Name: Date of Service: [name] RMA Long. 07/20/2023 10:45 A M Medical Record Number: [id number] Patient Account Number: [id number] Date of Birth/Sex: Treating RN: 12-26-1945 (77 y.o. [name] Primary Care Clydette Privitera: Dina Rich Other Clinician: Referring Adolpho Meenach: Treating Rexanne Inocencio/Extender: Pincus Badder in Treatment: 25 Active Problems Location of Pain Severity and Description of Pain Patient Has Paino Yes Site Locations Pain Location: Pain in Ulcers Duration of the Pain. Constant / Intermittento Intermittent Rate the pain. Current Pain Level: 4 Character of Pain Describe the Pain: Other: stinging Pain Management and Medication COZETTA, MCCLEES ([id number]) [id number].pdf Page 6 of 9 Current Pain Management: Electronic Signature(s) Signed: 07/20/2023 4:30:21 PM By: Gelene Mink By: Samuella Bruin on 07/20/2023  07:42:24 -------------------------------------------------------------------------------- Patient/Caregiver Education Details Patient Name: Date of Service: [name] RMA Long. 9/23/2024andnbsp10:45 A M Medical Record Number: [id number] Patient Account Number: [id number] Date of Birth/Gender: Treating RN: 04/07/1946 (77 y.o. [name] Primary Care Physician: Dina Rich Other Clinician: Referring Physician: Treating Physician/Extender: Pincus Badder in Treatment: 25 Education Assessment Education Provided To: Patient Education Topics Provided Wound/Skin Impairment: Methods: Explain/Verbal Responses: Reinforcements needed, State content correctly [employer]) Signed: 07/20/2023 4:30:21 PM By: Samuella Bruin Entered By: Samuella Bruin on 07/20/2023 07:37:13 -------------------------------------------------------------------------------- Wound Assessment Details Patient Name: Date of Service: [name] RMA Long. 07/20/2023 10:45 A M Medical Record Number: [id number] Patient Account Number: [id number] Date of Birth/Sex: Treating RN: 14-Aug-1946 (77 y.o. [name] Primary Care Melena Hayes: Dina Rich Other Clinician: Referring Pernell Dikes: Treating Semaya Vida/Extender: Pincus Badder in Treatment: 25 Wound Status Wound Number: 4 Primary Venous Leg Ulcer Etiology: Wound Location: Left, Anterior Lower Leg Wound Open Wounding Event: Trauma Status: Date Acquired: 11/27/2022 Comorbid Cataracts, Anemia, Chronic Obstructive Pulmonary Disease Weeks Of Treatment: 25 History: (COPD), Coronary Artery Disease, Peripheral Venous Disease, Clustered Wound: Yes Raynauds, Rheumatoid Arthritis, Osteoarthritis Photos SHERALYN, TREICHLER Long ([id number]) [id number] Page 7 of 9 Wound Measurements Length: (cm) Width: (cm) Depth: (cm) Clustered Quantity: Area: (cm) Volume: (cm) 3 % Reduction in Area:  18.9% 3 % Reduction in Volume: 18.9% 0.1 Epithelialization: Medium (34-66%) 4 Tunneling: No 7.069 Undermining: No 0.707 Wound Description Classification: Full Thickness Without Exposed Sup Wound Margin: Distinct, outline attached Exudate Amount: Medium Exudate Type: Serosanguineous Exudate Color: red, brown port Structures Foul Odor After Cleansing: No Slough/Fibrino Yes Wound Bed Granulation Amount: Small (1-33%) Exposed Structure Granulation Quality: Red Fascia Exposed: No Necrotic Amount: Large (67-100%) Fat Layer (Subcutaneous Tissue) Exposed: Yes Necrotic Quality: Adherent Slough

## 2023-08-10 ENCOUNTER — Encounter (HOSPITAL_BASED_OUTPATIENT_CLINIC_OR_DEPARTMENT_OTHER): Payer: PPO | Attending: General Surgery | Admitting: General Surgery

## 2023-08-10 DIAGNOSIS — I7300 Raynaud's syndrome without gangrene: Secondary | ICD-10-CM | POA: Insufficient documentation

## 2023-08-10 DIAGNOSIS — Z87891 Personal history of nicotine dependence: Secondary | ICD-10-CM | POA: Diagnosis not present

## 2023-08-10 DIAGNOSIS — E782 Mixed hyperlipidemia: Secondary | ICD-10-CM | POA: Diagnosis not present

## 2023-08-10 DIAGNOSIS — I2510 Atherosclerotic heart disease of native coronary artery without angina pectoris: Secondary | ICD-10-CM | POA: Insufficient documentation

## 2023-08-10 DIAGNOSIS — I272 Pulmonary hypertension, unspecified: Secondary | ICD-10-CM | POA: Insufficient documentation

## 2023-08-10 DIAGNOSIS — K219 Gastro-esophageal reflux disease without esophagitis: Secondary | ICD-10-CM | POA: Insufficient documentation

## 2023-08-10 DIAGNOSIS — M069 Rheumatoid arthritis, unspecified: Secondary | ICD-10-CM | POA: Diagnosis not present

## 2023-08-10 DIAGNOSIS — J449 Chronic obstructive pulmonary disease, unspecified: Secondary | ICD-10-CM | POA: Diagnosis not present

## 2023-08-10 DIAGNOSIS — Z8673 Personal history of transient ischemic attack (TIA), and cerebral infarction without residual deficits: Secondary | ICD-10-CM | POA: Diagnosis not present

## 2023-08-10 DIAGNOSIS — L97822 Non-pressure chronic ulcer of other part of left lower leg with fat layer exposed: Secondary | ICD-10-CM | POA: Insufficient documentation

## 2023-08-10 NOTE — Progress Notes (Signed)
NAKINA, SPATZ ([id number]) [id number] Page 1 of 8 Visit Report for 08/10/2023 Arrival Information Details Patient Name: Date of Service: [name], Elaine RMA Long. 08/10/2023 10:45 A M Medical Record Number: [id number] Patient Account Number: [id number] Date of Birth/Sex: Treating RN: Aug 18, 1946 (77 y.o. [name] Primary Care [name]: [name] Other Clinician: Referring [name]: Treating [name]/Extender: [name] in Treatment: 28 Visit Information History Since Last Visit Added or deleted any medications: No Patient Arrived: Ambulatory Any new allergies or adverse reactions: No Arrival Time: 10:52 Had a fall or experienced change in No Accompanied By: husband activities of daily living that may affect Transfer Assistance: None risk of falls: Patient Identification Verified: Yes Signs or symptoms of abuse/neglect since last visito No Secondary Verification Process Completed: Yes Hospitalized since last visit: No Patient Requires Transmission-Based Precautions: No Implantable device outside of the clinic excluding No Patient Has Alerts: Yes cellular tissue based products placed in the center Patient Alerts: ABI L 1.16 02/19/22 since last visit: Has Dressing in Place as Prescribed: Yes Has Compression in Place as Prescribed: Yes Pain Present Now: No Electronic Signature(s) Signed: 08/10/2023 3:34:06 PM By: [name] Entered By: [name] on 08/10/2023 07:53:35 -------------------------------------------------------------------------------- Encounter Discharge Information Details Patient Name: Date of Service: [name] RMA Long. 08/10/2023 10:45 A M Medical Record Number: [id number] Patient Account Number: [id number] Date of Birth/Sex: Treating RN: [birth year] (77 y.o. [name] Primary Care [name]: [name] Other Clinician: Referring [name]: Treating [name]/Extender: [name] in Treatment: 28 Encounter Discharge Information Items Post Procedure Vitals Discharge Condition: Stable Temperature (F): 97.7 Ambulatory Status: Ambulatory Pulse (bpm): 102 Discharge Destination: Home Respiratory Rate (breaths/min): 16 Transportation: Private Auto Blood Pressure (mmHg): 126/71 Accompanied By: husband Schedule Follow-up Appointment: Yes Clinical Summary of Care: Patient Declined Electronic Signature(s) Signed: 08/10/2023 3:34:06 PM By: [name] Entered By: [name] on 08/10/2023 08:15:04 [name] ([id number]) [id number] Page 2 of 8 -------------------------------------------------------------------------------- Lower Extremity Assessment Details Patient Name: Date of Service: [name], Elaine RMA Long. 08/10/2023 10:45 A M Medical Record Number: [id number] Patient Account Number: [id number] Date of Birth/Sex: Treating RN: [birth year] (77 y.o. [name] Primary Care [name]: [name] Other Clinician: Referring [name]: Treating [name]/Extender: [name] in Treatment: 28 Edema Assessment Assessed: [name]: No] [name]: No] Edema: [Left: N] [Right: o] Calf Left: Right: Point of Measurement: 25 cm From Medial Instep 31 cm Ankle Left: Right: Point of Measurement: 10 cm From Medial Instep 18 cm Vascular Assessment Pulses: Dorsalis Pedis Palpable: [Left:Yes] Extremity colors, hair growth, and conditions: Extremity Color: [Left:Hyperpigmented] Hair Growth on Extremity: [Left:No] Temperature of Extremity: [Left:Warm < 3 seconds] Electronic Signature(s) Signed: 08/10/2023 3:34:06 PM By: [name] Entered By: [name] on 08/10/2023 07:56:16 -------------------------------------------------------------------------------- Multi Wound Chart Details Patient Name: Date of Service: [name] RMA Long. 08/10/2023 10:45 A M Medical Record  Number: [id number] Patient Account Number: [id number] Date of Birth/Sex: Treating RN: 1946/07/19 (77 y.o. F) Primary Care [name]: [name] Other Clinician: Referring [name]: Treating [name]/Extender: [name] in Treatment: 28 Vital Signs Height(in): 63 Pulse(bpm): 102 Weight(lbs): 115 Blood Pressure(mmHg): 126/71 Body Mass Index(BMI): 20.4 Temperature(F): 97.7 Respiratory Rate(breaths/min): 16 [4:Photos:] [N/A:N/A] Left, Anterior Lower Leg N/A N/A Wound Location: Trauma N/A N/A Wounding Event: Venous Leg Ulcer N/A N/A Primary Etiology: Cataracts, Anemia, Chronic N/A N/A Comorbid History: Obstructive Pulmonary Disease (COPD), Coronary Artery Disease, Peripheral Venous Disease, Raynauds, Rheumatoid Arthritis, Osteoarthritis 11/27/2022 N/A N/A Date Acquired: 28 N/A N/A Weeks  of Treatment: Open N/A N/A Wound Status: No N/A N/A Wound Recurrence: Yes N/A N/A Clustered Wound: 4 N/A N/A Clustered Quantity: 2.7x2.6x0.1 N/A N/A Measurements L x W x D (cm) 5.513 N/A N/A A (cm) : rea 0.551 N/A N/A Volume (cm) : 36.80% N/A N/A % Reduction in A rea: 36.80% N/A N/A % Reduction in Volume: Full Thickness Without Exposed N/A N/A Classification: Support Structures Medium N/A N/A Exudate A mount: Serosanguineous N/A N/A Exudate Type: red, brown N/A N/A Exudate Color: Distinct, outline attached N/A N/A Wound Margin: Small (1-33%) N/A N/A Granulation A mount: Red N/A N/A Granulation Quality: Large (67-100%) N/A N/A Necrotic A mount: Eschar, Adherent Slough N/A N/A Necrotic Tissue: Fat Layer (Subcutaneous Tissue): Yes N/A N/A Exposed Structures: Fascia: No Tendon: No Muscle: No Joint: No Bone: No Medium (34-66%) N/A N/A Epithelialization: Debridement - Selective/Open Wound N/A N/A Debridement: Pre-procedure Verification/Time Out 11:02 N/A N/A Taken: Lidocaine 4% Topical Solution N/A N/A Pain Control: Necrotic/Eschar,  Slough N/A N/A Tissue Debrided: Non-Viable Tissue N/A N/A Level: 5.51 N/A N/A Debridement A (sq cm): rea Curette N/A N/A Instrument: Minimum N/A N/A Bleeding: Pressure N/A N/A Hemostasis A chieved: Procedure was tolerated well N/A N/A Debridement Treatment Response: 2.7x2.6x0.1 N/A N/A Post Debridement Measurements L x W x D (cm) 0.551 N/A N/A Post Debridement Volume: (cm) Scarring: Yes N/A N/A Periwound Skin Texture: Excoriation: No Induration: No Callus: No Crepitus: No Rash: No Maceration: Yes N/A N/A Periwound Skin Moisture: Dry/Scaly: No Hemosiderin Staining: Yes N/A N/A Periwound Skin Color: Atrophie Blanche: No Cyanosis: No Ecchymosis: No Erythema: No Mottled: No Pallor: No Rubor: No No Abnormality N/A N/A Temperature: Debridement N/A N/A Procedures Performed: Treatment Notes Wound #4 (Lower Leg) Wound Laterality: Left, Anterior Cleanser Soap and Water Discharge Instruction: May shower and wash wound with dial antibacterial soap and water prior to dressing change. Vashe 5.8 (oz) Discharge Instruction: Cleanse the wound with Vashe prior to applying a clean dressing using gauze sponges, not tissue or cotton balls. [name], [name] ([id number]) [id number] Page 4 of 8 Peri-Wound Care Triamcinolone 15 (g) Discharge Instruction: Use triamcinolone 15 (g) as directed Sween Lotion (Moisturizing lotion) Discharge Instruction: Apply moisturizing lotion as directed Topical Gentamicin Discharge Instruction: As directed by physician Mupirocin Ointment Discharge Instruction: Apply Mupirocin (Bactroban) as instructed Primary Dressing Secondary Dressing T Non-adherent Dressing, 2x3 in elfa Discharge Instruction: Apply over primary dressing as directed. Secured With Compression Wrap Kerlix Roll 4.5x3.1 (in/yd) Discharge Instruction: Apply Kerlix and Coban compression as directed. Coban Self-Adherent Wrap 4x5 (in/yd) Discharge  Instruction: Apply over Kerlix as directed. Compression Stockings Add-Ons Electronic Signature(s) Signed: 08/10/2023 11:32:36 AM By: Duanne Guess MD FACS Entered By: Duanne Guess on 08/10/2023 08:32:36 -------------------------------------------------------------------------------- Multi-Disciplinary Care Plan Details Patient Name: Date of Service: [name] RMA Long. 08/10/2023 10:45 A M Medical Record Number: [id number] Patient Account Number: [id number] Date of Birth/Sex: Treating RN: 09/04/1946 (77 y.o. [name] Primary Care Shay Bartoli: [name] Other Clinician: Referring Jerrid Forgette: Treating Jareb Radoncic/Extender: [name] in Treatment: 28 Active Inactive Nutrition Nursing Diagnoses: Potential for alteratiion in Nutrition/Potential for imbalanced nutrition Goals: Patient/caregiver agrees to and verbalizes understanding of need to obtain nutritional consultation Date Initiated: 01/26/2023 Target Resolution Date: 08/28/2023 Goal Status: Active Patient/caregiver will maintain therapeutic glucose control Date Initiated: 01/26/2023 Target Resolution Date: 08/28/2023 Goal Status: Active Interventions: Assess patient nutrition upon admission and as needed per policy Provide education on nutrition [name], [name] ([id number]) [phone].pdf Page 5 of 8 Treatment Activities: Giving encouragement to exercise : 01/26/2023  Notes: Pain, Acute or Chronic Nursing Diagnoses: Pain, acute or chronic: actual or potential Potential alteration in comfort, pain Goals: Patient will verbalize adequate pain control and receive pain control interventions during procedures as needed Date Initiated: 01/26/2023 Date Inactivated: 05/29/2023 Target Resolution Date: 05/29/2023 Goal Status: Met Patient/caregiver will verbalize comfort level met Date Initiated: 01/26/2023 Target Resolution Date: 08/28/2023 Goal Status: Active Interventions: Complete  pain assessment as per visit requirements Encourage patient to take pain medications as prescribed Provide education on pain management Treatment Activities: Administer pain control measures as ordered : 01/26/2023 Notes: Electronic Signature(s) Signed: 08/10/2023 3:34:06 PM By: [name] Entered By: [name] on 08/10/2023 08:04:03 -------------------------------------------------------------------------------- Pain Assessment Details Patient Name: Date of Service: [name] RMA Long. 08/10/2023 10:45 A M Medical Record Number: [id number] Patient Account Number: [id number] Date of Birth/Sex: Treating RN: Jun 22, 1946 (77 y.o. [name] Primary Care Louvenia Golomb: [name] Other Clinician: Referring Jeannette Maddy: Treating [name]/Extender: [name] in Treatment: 28 Active Problems Location of Pain Severity and Description of Pain Patient Has Paino No Site Locations Rate the pain. Current Pain Level: 0 Pain Management and Medication Current Pain Management: [name], [name] ([id number]) [id number] Page 6 of 8 Electronic Signature(s) Signed: 08/10/2023 3:34:06 PM By: Gelene Mink By: [name] on 08/10/2023 07:54:02 -------------------------------------------------------------------------------- Patient/Caregiver Education Details Patient Name: Date of Service: [name] RMA Long. 10/14/2024andnbsp10:45 A M Medical Record Number: [id number] Patient Account Number: [id number] Date of Birth/Gender: Treating RN: 01/01/1946 (77 y.o. [name] Primary Care Physician: [name] Other Clinician: Referring Physician: Treating Physician/Extender: [name] in Treatment: 28 Education Assessment Education Provided To: Patient Education Topics Provided Wound/Skin Impairment: Methods: Explain/Verbal Responses: Reinforcements needed, State content  correctly Electronic Signature(s) Signed: 08/10/2023 3:34:06 PM By: [name] Entered By: [name] on 08/10/2023 08:04:19 -------------------------------------------------------------------------------- Wound Assessment Details Patient Name: Date of Service: [name] RMA Long. 08/10/2023 10:45 A M Medical Record Number: [id number] Patient Account Number: [id number] Date of Birth/Sex: Treating RN: Nov 15, 1945 (77 y.o. [name] Primary Care Tijana Walder: [name] Other Clinician: Referring Anetra Czerwinski: Treating Hetty Linhart/Extender: [name] in Treatment: 28 Wound Status Wound Number: 4 Primary Venous Leg Ulcer Etiology: Wound Location: Left, Anterior Lower Leg Wound Open Wounding Event: Trauma Status: Date Acquired: 11/27/2022 Comorbid Cataracts, Anemia, Chronic Obstructive Pulmonary Disease Weeks Of Treatment: 28 History: (COPD), Coronary Artery Disease, Peripheral Venous Disease, Clustered Wound: Yes Raynauds, Rheumatoid Arthritis, Osteoarthritis Photos [name], [name] ([id number]) [id number] Page 7 of 8 Wound Measurements Length: (cm) Width: (cm) Depth: (cm) Clustered Quantity: Area: (cm) Volume: (cm) 2.7 % Reduction in Area: 36.8% 2.6 % Reduction in Volume: 36.8% 0.1 Epithelialization: Medium (34-66%) 4 Tunneling: No 5.513 Undermining: No 0.551 Wound Description Classification: Full Thickness Without Exposed Sup Wound Margin: Distinct, outline attached Exudate Amount: Medium Exudate Type: Serosanguineous Exudate Color: red, brown port Structures Foul Odor After Cleansing: No Slough/Fibrino Yes Wound Bed Granulation Amount: Small (1-33%) Exposed Structure Granulation Quality: Red Fascia Exposed: No Necrotic Amount: Large (67-100%) Fat Layer (Subcutaneous Tissue) Exposed: Yes Necrotic Quality: Eschar, Adherent Slough Tendon Exposed: No Muscle Exposed: No Joint Exposed: No Bone Exposed:  No Periwound Skin Texture Texture Color No Abnormalities Noted: Yes No Abnormalities Noted: No Atrophie Blanche: No Moisture Cyanosis: No No Abnormalities Noted: Yes Ecchymosis: No Erythema: No Hemosiderin Staining: Yes Mottled: No Pallor: No Rubor: No Temperature / Pain Temperature: No Abnormality Treatment Notes Wound #4 (Lower Leg) Wound Laterality: Left, Anterior Cleanser Soap and Water Discharge Instruction: May shower  and wash wound with dial antibacterial soap and water prior to dressing change. Vashe 5.8 (oz) Discharge Instruction: Cleanse the wound with Vashe prior to applying a clean dressing using gauze sponges, not tissue or cotton balls. Peri-Wound Care Triamcinolone 15 (g) Discharge Instruction: Use triamcinolone 15 (g) as directed Sween Lotion (Moisturizing lotion) Discharge Instruction: Apply moisturizing lotion as directed Topical Gentamicin Discharge Instruction: As directed by physician Mupirocin Ointment Discharge Instruction: Apply Mupirocin (Bactroban) as instructed [name], [name] ([id number]) [id number] Page 8 of 8 Primary Dressing Secondary Dressing T Non-adherent Dressing, 2x3 in elfa Discharge Instruction: Apply over primary dressing as directed. Secured With Compression Wrap Kerlix Roll 4.5x3.1 (in/yd) Discharge Instruction: Apply Kerlix and Coban compression as directed. Coban Self-Adherent Wrap 4x5 (in/yd) Discharge Instruction: Apply over Kerlix as directed. Compression Stockings Add-Ons Electronic Signature(s) Signed: 08/10/2023 3:34:06 PM By: [name] Entered By: [name] on 08/10/2023 07:59:09 -------------------------------------------------------------------------------- Vitals Details Patient Name: Date of Service: [name] RMA Long. 08/10/2023 10:45 A M Medical Record Number: [id number] Patient Account Number: [id number] Date of Birth/Sex: Treating RN: 1946/05/21 (77 y.o. [name] Primary Care Jeremy Ditullio: [name] Other Clinician: Referring Tashai Catino: Treating Daria Mcmeekin/Extender: [name] in Treatment: 28 Vital Signs Time Taken: 10:53 Temperature (F): 97.7 Height (in): 63 Pulse (bpm): 102 Weight (lbs): 115 Respiratory Rate (breaths/min): 16 Body Mass Index (BMI): 20.4 Blood Pressure (mmHg): 126/71 Reference Range: 80 - 120 mg / dl Electronic Signature(s) Signed: 08/10/2023 3:34:06 PM By: [name] Entered By: [name] on 08/10/2023 07:53:57

## 2023-08-10 NOTE — Progress Notes (Signed)
Elaine, Long ([id number]) [id number] Page 1 of 11 Visit Report for 08/10/2023 Chief Complaint Document Details Patient Name: Date of Service: [name], [name] RMA Long. 08/10/2023 10:45 A M Medical Record Number: [id number] Patient Account Number: [id number] Date of Birth/Sex: Treating RN: Jul 18, 1946 (77 y.o. F) Primary Care Provider: Dina Long Other Clinician: Referring Provider: Treating Provider/Extender: [name] in Treatment: 28 Information Obtained from: Patient Chief Complaint Patient seen for complaints of Non-Healing Wound. Electronic Signature(s) Signed: 08/10/2023 11:32:44 AM By: Elaine Guess MD FACS Entered By: [name] on 08/10/2023 11:32:44 -------------------------------------------------------------------------------- Debridement Details Patient Name: Date of Service: Elaine Hurst RMA Long. 08/10/2023 10:45 A M Medical Record Number: [id number] Patient Account Number: [id number] Date of Birth/Sex: Treating RN: [birth year] (77 y.o. [name] Primary Care Provider: Dina Long Other Clinician: Referring Provider: Treating Provider/Extender: [name] in Treatment: 28 Debridement Performed for Assessment: Wound #4 Left,Anterior Lower Leg Performed By: Physician Elaine Guess, MD The following information was scribed by: [name] The information was scribed for: [name] Debridement Type: Debridement Severity of Tissue Pre Debridement: Fat layer exposed Level of Consciousness (Pre-procedure): Awake and Alert Pre-procedure Verification/Time Out Yes - 11:02 Taken: Start Time: 11:02 Pain Control: Lidocaine 4% Topical Solution Percent of Wound Bed Debrided: 100% T Area Debrided (cm): otal 5.51 Tissue and other material debrided: Non-Viable, Eschar, Slough, Slough Level: Non-Viable Tissue Debridement Description: Selective/Open Wound Instrument:  Curette Bleeding: Minimum Hemostasis Achieved: Pressure Response to Treatment: Procedure was tolerated well Level of Consciousness (Post- Awake and Alert procedure): Post Debridement Measurements of Total Wound Length: (cm) 2.7 Width: (cm) 2.6 Depth: (cm) 0.1 Volume: (cm) 0.551 Character of Wound/Ulcer Post Debridement: Improved Severity of Tissue Post Debridement: Fat layer exposed [name], [name] ([id number]) [id number] Page 2 of 11 Post Procedure Diagnosis Same as Pre-procedure Electronic Signature(s) Signed: 08/10/2023 1:07:52 PM By: Elaine Guess MD FACS Signed: 08/10/2023 3:34:06 PM By: [name] By: [name] on 08/10/2023 11:06:27 -------------------------------------------------------------------------------- HPI Details Patient Name: Date of Service: Elaine Hurst RMA Long. 08/10/2023 10:45 A M Medical Record Number: [id number] Patient Account Number: [id number] Date of Birth/Sex: Treating RN: 09/23/1946 (77 y.o. F) Primary Care Provider: Dina Long Other Clinician: Referring Provider: Treating Provider/Extender: [name] in Treatment: 28 History of Present Illness HPI Description: ADMISSION 02/19/2022 This is a 77 year old woman with minimal pertinent medical history. She does have COPD and pulmonary hypertension, but is not diabetic and is not a smoker. About 6 months ago, she and her husband got a new beagle puppy. The puppy scratched her leg and the scratches ultimately deteriorated into ulcerations. Apparently she sought care with a dermatologist who recommended applying a one-to-one mixture of peroxide and water to the wounds followed by a thick layer of Vaseline. She continue this for some time but then saw her primary care provider who told her to discontinue the peroxide. She has not been on any antibiotics for the scratches. Today, there are 3 separate wounds on her anterior tibial  surface on the left. They are tender and her leg has localized swelling. There is yellow slough buildup in each of the wound bases. ABI in clinic today was normal at 1.16. 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 02/25/2022: Over the past week, the wounds themselves have not improved tremendously but she has noticed some stinging and the periwound skin is quite inflamed. I took a culture last week that had low levels of methicillin-resistant Staph aureus. Because it  was fairly low level, I did not prescribe an oral antibiotic. I had planned to change her to mupirocin today, however. We have been using Santyl under Hydrofera Blue with 3 layer compression. 03/04/2022: The wounds have improved quite a bit over the past week. They are less painful and red. She has been taking the prescribed doxycycline but says that it gives her a stomachache. We have been using mupirocin with Hydrofera Blue and 3 layer compression. 03/11/2022: Continued improvement of the wounds. They have contracted quite a bit and have a good surface of healthy granulation tissue present. There is some dried eschar present around all 3 of the lesions. 03/17/2022: No significant change in the wounds from last week. The surface is clean with just a little bit of eschar and slough accumulation. No odor or drainage. No concern for infection. 03/26/2022: All of her wounds are smaller this week. There is good granulation tissue on the surface of each. No slough accumulation. There is just some dry skin around the wounds but not actually involving the wounds. No concern for infection. 04/04/2022: The 2 proximal wounds have healed. The distal wound is much smaller and just has a little bit of dry eschar around the edges. 04/11/2022: Her wound is nearly healed. It is clean without concern for infection. 04/18/2022: Her wound has healed. READMISSION 01/26/2023 About 8 weeks ago, she sustained another scratch from her Beagle on  her left anterior tibial surface.. She has been trying to treat the wound at home with Vaseline. There is thick slough buildup on the wound surface and the periwound skin looks a bit macerated. No overt sign of infection. 01/29/2023: She came in for an unscheduled visit today because her wound began bleeding. It apparently bled quite profusely and her husband used an over-the- counter topical agent to get it to stop. On inspection today, he managed to achieve good hemostasis and there is no ongoing blood loss. 02/03/2023: No further issues with bleeding. There is minimal slough accumulation. Some hypertrophic granulation tissue present. 02/11/2023: She has had a lot more drainage over the past week and there has been more breakdown of the skin around her wound. The wound itself has expanded and there is quite a bit of slough on the surfaces. 02/18/2023: Her wound looks much better this week. The drainage has decreased and the periwound skin is in much improved condition. The wound is smaller and there is just 1 tiny satellite adjacent to the main wound. There is slough accumulation on the surfaces. The culture that I took last week returned with methicillin sensitive Staph aureus. She is currently taking Keflex for this. 02/25/2023: Her wound is smaller again this week with minimal slough accumulation. The periwound looks significantly improved. She has completed her oral Keflex. 03/04/2023: The wound continues to contract. The periwound is completely healed and looks like normal intact skin. There is some slough on the surface of the wound overlying good granulation tissue. NATALYA, DOMZALSKI ([id number]) [id number] Page 3 of 11 03/11/2023: The wound measured about the same size today, although visually it appears smaller. There is a little bit of slough on the surface with good granulation tissue underneath. 03/17/2023: The wound is a little bit smaller today with light slough on the  wound surface. No erythema, induration, or significant drainage. 03/30/2023: The wound is a little bit smaller again today with minimal slough and eschar accumulation. 04/06/2023: Her skin irritation from the Zetuvit dressing has gotten worse and she has had a fair amount  of skin breakdown in the distribution of the bandage. This is also caused some breakdown of the existing wound. In addition, when she was at a wedding this weekend, she fell and suffered a large skin tear to her proximal left lower leg and left knee. The fat layer is exposed. There is slough accumulation on the surfaces. 04/13/2023: All of the wounds have deteriorated. They are red and angry-looking. There is slough on all of the surfaces. 04/20/2023: There has been significant improvement since last week. The erythema and irritation have resolved. There is minimal slough on the wound surfaces along with a little bit of light periwound eschar. She continues on Keflex. 04/27/2023: Continued improvement of her wounds. All of them are smaller. The knee wound is nearly closed. There is slough and eschar on all of the wound surfaces. She has completed her course of oral antibiotics. 05/04/2023: The wounds are all smaller today with slough and eschar present on all of the surfaces. The moisture balance is leaning a bit towards the dry side, however. 05/12/2023: The wound on her knee is healed. The other skin tear at the tibial tuberosity is nearly closed with just a small open area. The lower leg wounds are about the same size. Moisture balance is better. There is slough on all of the open wound surfaces. 05/20/2023: The wounds measured a little bit smaller today. They have slough accumulation and the patient says everything is quite a bit more tender. There is more periwound erythema present today. 05/29/2023: The skin tears on her upper leg have healed. The lower wounds are all smaller with just some thin slough on the surface. Her tenderness  and erythema have resolved. She completed her course of Keflex yesterday. 06/04/2023: All of the lower leg wounds are smaller again today. There is no erythema surrounding the wounds. Light slough on the surfaces. 06/18/2023: The lower leg wounds are stable. There is some slough on the surfaces. Both the patient and her husband are a little bit frustrated that she has not made more rapid improvement. 07/02/2023: Her wounds look better this week. The periwound erythema and irritation that have typically been present are not there today. There is slough on the wound surfaces. The small wound on the plantar surface of her great toe has contracted, as well. Very thin biofilm on the surface here. 07/20/2023: The wound on the plantar surface of her great toe is healed. Although the wound measurements were apparently the same, they appear smaller to me visually. There is slough and eschar accumulation. No periwound erythema. 08/10/2023: We are down to just 2 open areas at this point. They are both smaller, with a little bit of slough and eschar on each surface. Electronic Signature(s) Signed: 08/10/2023 11:33:26 AM By: Elaine Guess MD FACS Entered By: [name] on 08/10/2023 11:33:26 -------------------------------------------------------------------------------- Physical Exam Details Patient Name: Date of Service: Elaine Hurst RMA Long. 08/10/2023 10:45 A M Medical Record Number: [id number] Patient Account Number: [id number] Date of Birth/Sex: Treating RN: Jan 19, 1946 (77 y.o. F) Primary Care Provider: Dina Long Other Clinician: Referring Provider: Treating Provider/Extender: [name] in Treatment: 28 Constitutional . Slightly tachycardic. . . no acute distress. Respiratory Normal work of breathing on room air. Notes 08/10/2023: We are down to just 2 open areas at this point. They are both smaller, with a little bit of slough and eschar on each surface. Electronic  Signature(s) Signed: 08/10/2023 11:33:59 AM By: Elaine Guess MD FACS Entered By: [name] on 08/10/2023 11:33:58  FRAN, NEISWONGER ([id number]) [id number] Page 4 of 11 -------------------------------------------------------------------------------- Physician Orders Details Patient Name: Date of Service: BETHANEE, REDONDO RMA Long. 08/10/2023 10:45 A M Medical Record Number: [id number] Patient Account Number: [id number] Date of Birth/Sex: Treating RN: 1945-11-17 (77 y.o. [name] Primary Care Provider: Dina Long Other Clinician: Referring Provider: Treating Provider/Extender: [name] in Treatment: 28 The following information was scribed by: [name] The information was scribed for: [name] Verbal / Phone Orders: No Diagnosis Coding ICD-10 Coding Code Description [phone] Non-pressure chronic ulcer of other part of left lower leg with fat layer exposed I27.20 Pulmonary hypertension, unspecified I51.9 Heart disease, unspecified Follow-up Appointments Return appointment in 1 month. - Dr. Lady Gary - room 2 Anesthetic (In clinic) Topical Lidocaine 4% applied to wound bed Bathing/ Shower/ Hygiene May shower and wash wound with soap and water. - with dressing changes Edema Control - Lymphedema / SCD / Other Elevate legs to the level of the heart or above for 30 minutes daily and/or when sitting for 3-4 times a day throughout the day. A void standing for long periods of time. Exercise regularly If compression wraps slide down please call wound center and speak with a nurse. Wound Treatment Wound #4 - Lower Leg Wound Laterality: Left, Anterior Cleanser: Soap and Water Every Other Day/30 Days Discharge Instructions: May shower and wash wound with dial antibacterial soap and water prior to dressing change. Cleanser: Vashe 5.8 (oz) Every Other Day/30 Days Discharge Instructions: Cleanse the wound with  Vashe prior to applying a clean dressing using gauze sponges, not tissue or cotton balls. Peri-Wound Care: Triamcinolone 15 (g) Every Other Day/30 Days Discharge Instructions: Use triamcinolone 15 (g) as directed Peri-Wound Care: Sween Lotion (Moisturizing lotion) Every Other Day/30 Days Discharge Instructions: Apply moisturizing lotion as directed Topical: Gentamicin Every Other Day/30 Days Discharge Instructions: As directed by physician Topical: Mupirocin Ointment Every Other Day/30 Days Discharge Instructions: Apply Mupirocin (Bactroban) as instructed Secondary Dressing: T Non-adherent Dressing, 2x3 in Every Other Day/30 Days elfa Discharge Instructions: Apply over primary dressing as directed. Compression Wrap: Kerlix Roll 4.5x3.1 (in/yd) Every Other Day/30 Days Discharge Instructions: Apply Kerlix and Coban compression as directed. Compression Wrap: Coban Self-Adherent Wrap 4x5 (in/yd) Every Other Day/30 Days Discharge Instructions: Apply over Kerlix as directed. Patient Medications llergies: Sulfa (Sulfonamide Antibiotics), adhesive, amoxicillin, ciprofloxacin, Statins-Hmg-Coa Reductase Inhibitors A Notifications Medication Indication Start End TREAZURE, NERY ([id number]) [id number] Page 5 of 11 08/10/2023 lidocaine DOSE topical 4 % cream - cream topical Electronic Signature(s) Signed: 08/10/2023 1:07:52 PM By: Elaine Guess MD FACS Entered By: [name] on 08/10/2023 11:34:13 -------------------------------------------------------------------------------- Problem List Details Patient Name: Date of Service: Elaine Hurst RMA Long. 08/10/2023 10:45 A M Medical Record Number: [id number] Patient Account Number: [id number] Date of Birth/Sex: Treating RN: 04-Jan-1946 (77 y.o. F) Primary Care Provider: Dina Long Other Clinician: Referring Provider: Treating Provider/Extender: [name] in Treatment: 28 Active  Problems ICD-10 Encounter Code Description Active Date MDM Diagnosis L97.822 Non-pressure chronic ulcer of other part of left lower leg with fat layer exposed4/10/2022 No Yes I27.20 Pulmonary hypertension, unspecified 01/26/2023 No Yes I51.9 Heart disease, unspecified 01/26/2023 No Yes Inactive Problems Resolved Problems ICD-10 Code Description Active Date Resolved Date L97.122 Non-pressure chronic ulcer of left thigh with fat layer exposed 04/06/2023 04/06/2023 L97.522 Non-pressure chronic ulcer of other part of left foot with fat layer exposed 07/02/2023 07/02/2023 Electronic Signature(s) Signed: 08/10/2023 11:32:19 AM By: Elaine Guess MD FACS Entered By: [name] on 08/10/2023 11:32:18 --------------------------------------------------------------------------------  Progress Note Details Patient Name: Date of Service: LAVILLA, DELAMORA RMA Long. 08/10/2023 10:45 A M Medical Record Number: [id number] Patient Account Number: [id number] Date of Birth/Sex: Treating RN: 07/02/1946 (77 y.o. Ermalinda Joubert, Mario Long ([id number]) [id number] Page 6 of 11 Primary Care Provider: Dina Long Other Clinician: Referring Provider: Treating Provider/Extender: [name] in Treatment: 28 Subjective Chief Complaint Information obtained from Patient Patient seen for complaints of Non-Healing Wound. History of Present Illness (HPI) ADMISSION 02/19/2022 This is a 77 year old woman with minimal pertinent medical history. She does have COPD and pulmonary hypertension, but is not diabetic and is not a smoker. About 6 months ago, she and her husband got a new beagle puppy. The puppy scratched her leg and the scratches ultimately deteriorated into ulcerations. Apparently she sought care with a dermatologist who recommended applying a one-to-one mixture of peroxide and water to the wounds followed by a thick layer of Vaseline. She continue this for some time but then  saw her primary care provider who told her to discontinue the peroxide. She has not been on any antibiotics for the scratches. Today, there are 3 separate wounds on her anterior tibial surface on the left. They are tender and her leg has localized swelling. There is yellow slough buildup in each of the wound bases. ABI in clinic today was normal at 1.16. 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 02/25/2022: Over the past week, the wounds themselves have not improved tremendously but she has noticed some stinging and the periwound skin is quite inflamed. I took a culture last week that had low levels of methicillin-resistant Staph aureus. Because it was fairly low level, I did not prescribe an oral antibiotic. I had planned to change her to mupirocin today, however. We have been using Santyl under Hydrofera Blue with 3 layer compression. 03/04/2022: The wounds have improved quite a bit over the past week. They are less painful and red. She has been taking the prescribed doxycycline but says that it gives her a stomachache. We have been using mupirocin with Hydrofera Blue and 3 layer compression. 03/11/2022: Continued improvement of the wounds. They have contracted quite a bit and have a good surface of healthy granulation tissue present. There is some dried eschar present around all 3 of the lesions. 03/17/2022: No significant change in the wounds from last week. The surface is clean with just a little bit of eschar and slough accumulation. No odor or drainage. No concern for infection. 03/26/2022: All of her wounds are smaller this week. There is good granulation tissue on the surface of each. No slough accumulation. There is just some dry skin around the wounds but not actually involving the wounds. No concern for infection. 04/04/2022: The 2 proximal wounds have healed. The distal wound is much smaller and just has a little bit of dry eschar around the edges. 04/11/2022: Her wound  is nearly healed. It is clean without concern for infection. 04/18/2022: Her wound has healed. READMISSION 01/26/2023 About 8 weeks ago, she sustained another scratch from her Beagle on her left anterior tibial surface.. She has been trying to treat the wound at home with Vaseline. There is thick slough buildup on the wound surface and the periwound skin looks a bit macerated. No overt sign of infection. 01/29/2023: She came in for an unscheduled visit today because her wound began bleeding. It apparently bled quite profusely and her husband used an over-the- counter topical agent to get it to stop. On  inspection today, he managed to achieve good hemostasis and there is no ongoing blood loss. 02/03/2023: No further issues with bleeding. There is minimal slough accumulation. Some hypertrophic granulation tissue present. 02/11/2023: She has had a lot more drainage over the past week and there has been more breakdown of the skin around her wound. The wound itself has expanded and there is quite a bit of slough on the surfaces. 02/18/2023: Her wound looks much better this week. The drainage has decreased and the periwound skin is in much improved condition. The wound is smaller and there is just 1 tiny satellite adjacent to the main wound. There is slough accumulation on the surfaces. The culture that I took last week returned with methicillin sensitive Staph aureus. She is currently taking Keflex for this. 02/25/2023: Her wound is smaller again this week with minimal slough accumulation. The periwound looks significantly improved. She has completed her oral Keflex. 03/04/2023: The wound continues to contract. The periwound is completely healed and looks like normal intact skin. There is some slough on the surface of the wound overlying good granulation tissue. 03/11/2023: The wound measured about the same size today, although visually it appears smaller. There is a little bit of slough on the surface with  good granulation tissue underneath. 03/17/2023: The wound is a little bit smaller today with light slough on the wound surface. No erythema, induration, or significant drainage. 03/30/2023: The wound is a little bit smaller again today with minimal slough and eschar accumulation. 04/06/2023: Her skin irritation from the Zetuvit dressing has gotten worse and she has had a fair amount of skin breakdown in the distribution of the bandage. This is also caused some breakdown of the existing wound. In addition, when she was at a wedding this weekend, she fell and suffered a large skin tear to her proximal left lower leg and left knee. The fat layer is exposed. There is slough accumulation on the surfaces. 04/13/2023: All of the wounds have deteriorated. They are red and angry-looking. There is slough on all of the surfaces. 04/20/2023: There has been significant improvement since last week. The erythema and irritation have resolved. There is minimal slough on the wound surfaces along with a little bit of light periwound eschar. She continues on Keflex. 04/27/2023: Continued improvement of her wounds. All of them are smaller. The knee wound is nearly closed. There is slough and eschar on all of the wound surfaces. She has completed her course of oral antibiotics. 05/04/2023: The wounds are all smaller today with slough and eschar present on all of the surfaces. The moisture balance is leaning a bit towards the dry side, however. 05/12/2023: The wound on her knee is healed. The other skin tear at the tibial tuberosity is nearly closed with just a small open area. The lower leg wounds are RAYAAN, LORAH ([id number]) [phone].pdf Page 7 of 11 about the same size. Moisture balance is better. There is slough on all of the open wound surfaces. 05/20/2023: The wounds measured a little bit smaller today. They have slough accumulation and the patient says everything is quite a bit more tender. There  is more periwound erythema present today. 05/29/2023: The skin tears on her upper leg have healed. The lower wounds are all smaller with just some thin slough on the surface. Her tenderness and erythema have resolved. She completed her course of Keflex yesterday. 06/04/2023: All of the lower leg wounds are smaller again today. There is no erythema surrounding the wounds. Light slough  on the surfaces. 06/18/2023: The lower leg wounds are stable. There is some slough on the surfaces. Both the patient and her husband are a little bit frustrated that she has not made more rapid improvement. 07/02/2023: Her wounds look better this week. The periwound erythema and irritation that have typically been present are not there today. There is slough on the wound surfaces. The small wound on the plantar surface of her great toe has contracted, as well. Very thin biofilm on the surface here. 07/20/2023: The wound on the plantar surface of her great toe is healed. Although the wound measurements were apparently the same, they appear smaller to me visually. There is slough and eschar accumulation. No periwound erythema. 08/10/2023: We are down to just 2 open areas at this point. They are both smaller, with a little bit of slough and eschar on each surface. Patient History Information obtained from Patient, Caregiver. Family History Unknown History. Social History Former smoker, Marital Status - Married, Alcohol Use - Never, Drug Use - No History, Caffeine Use - Rarely. Medical History Eyes Patient has history of Cataracts - bil removed Denies history of Glaucoma, Optic Neuritis Ear/Nose/Mouth/Throat Denies history of Chronic sinus problems/congestion, Middle ear problems Hematologic/Lymphatic Patient has history of Anemia Respiratory Patient has history of Chronic Obstructive Pulmonary Disease (COPD) Cardiovascular Patient has history of Coronary Artery Disease, Peripheral Venous Disease - varicose  veins Endocrine Denies history of Type I Diabetes, Type II Diabetes Genitourinary Denies history of End Stage Renal Disease Immunological Patient has history of Raynauds Denies history of Lupus Erythematosus, Scleroderma Integumentary (Skin) Denies history of History of Burn Musculoskeletal Patient has history of Rheumatoid Arthritis, Osteoarthritis Denies history of Gout Oncologic Denies history of Received Chemotherapy, Received Radiation Psychiatric Denies history of Anorexia/bulimia, Confinement Anxiety Hospitalization/Surgery History - bil cataract removal. - left shoiulder replacement. - right rotator cuff repair. - multiple lumbar spine surgeries. - melanoma removed left arm and chest. - partial excision bone left 2nd toe. Medical A Surgical History Notes nd Hematologic/Lymphatic varicose vein of leg, mixed hyperlipidemia Respiratory dyspnea, chronic bronchitis, pulmonary hypertension, pulmonary emphysema Cardiovascular aortic calcification, tachycardia, left ventricular dysfunction, bradycardia Gastrointestinal Gerd Endocrine prediabetes, hypothyroidism Genitourinary bladder disorder Integumentary (Skin) contact dermatitis Musculoskeletal acquired plantar porokeratosis, neurogenic claudication d/t lumbar spinal stenosis, supraspinatus tendinitis, restless leg syndrome, spinal stenosis of lumbar region Neurologic TIA, insomnia Bruhn, Petronella Long ([id number]) [id number] Page 8 of 11 Objective Constitutional Slightly tachycardic. no acute distress. Vitals Time Taken: 10:53 AM, Height: 63 in, Weight: 115 lbs, BMI: 20.4, Temperature: 97.7 F, Pulse: 102 bpm, Respiratory Rate: 16 breaths/min, Blood Pressure: 126/71 mmHg. Respiratory Normal work of breathing on room air. General Notes: 08/10/2023: We are down to just 2 open areas at this point. They are both smaller, with a little bit of slough and eschar on each surface. Integumentary (Hair,  Skin) Wound #4 status is Open. Original cause of wound was Trauma. The date acquired was: 11/27/2022. The wound has been in treatment 28 weeks. The wound is located on the Left,Anterior Lower Leg. The wound measures 2.7cm length x 2.6cm width x 0.1cm depth; 5.513cm^2 area and 0.551cm^3 volume. There is Fat Layer (Subcutaneous Tissue) exposed. There is no tunneling or undermining noted. There is a medium amount of serosanguineous drainage noted. The wound margin is distinct with the outline attached to the wound base. There is small (1-33%) red granulation within the wound bed. There is a large (67-100%) amount of necrotic tissue within the wound bed including Eschar and Adherent Slough. The  periwound skin appearance had no abnormalities noted for texture. The periwound skin appearance had no abnormalities noted for moisture. The periwound skin appearance exhibited: Hemosiderin Staining. The periwound skin appearance did not exhibit: Atrophie Blanche, Cyanosis, Ecchymosis, Mottled, Pallor, Rubor, Erythema. Periwound temperature was noted as No Abnormality. Assessment Active Problems ICD-10 Non-pressure chronic ulcer of other part of left lower leg with fat layer exposed Pulmonary hypertension, unspecified Heart disease, unspecified Procedures Wound #4 Pre-procedure diagnosis of Wound #4 is a Venous Leg Ulcer located on the Left,Anterior Lower Leg .Severity of Tissue Pre Debridement is: Fat layer exposed. There was a Selective/Open Wound Non-Viable Tissue Debridement with a total area of 5.51 sq cm performed by Elaine Guess, MD. With the following instrument(s): Curette to remove Non-Viable tissue/material. Material removed includes Eschar and Slough and after achieving pain control using Lidocaine 4% Topical Solution. No specimens were taken. A time out was conducted at 11:02, prior to the start of the procedure. A Minimum amount of bleeding was controlled with Pressure. The procedure was  tolerated well. Post Debridement Measurements: 2.7cm length x 2.6cm width x 0.1cm depth; 0.551cm^3 volume. Character of Wound/Ulcer Post Debridement is improved. Severity of Tissue Post Debridement is: Fat layer exposed. Post procedure Diagnosis Wound #4: Same as Pre-Procedure Plan Follow-up Appointments: Return appointment in 1 month. - Dr. Lady Gary - room 2 Anesthetic: (In clinic) Topical Lidocaine 4% applied to wound bed Bathing/ Shower/ Hygiene: May shower and wash wound with soap and water. - with dressing changes Edema Control - Lymphedema / SCD / Other: Elevate legs to the level of the heart or above for 30 minutes daily and/or when sitting for 3-4 times a day throughout the day. Avoid standing for long periods of time. Exercise regularly If compression wraps slide down please call wound center and speak with a nurse. The following medication(s) was prescribed: lidocaine topical 4 % cream cream topical was prescribed at facility WOUND #4: - Lower Leg Wound Laterality: Left, Anterior Cleanser: Soap and Water Every Other Day/30 Days Discharge Instructions: May shower and wash wound with dial antibacterial soap and water prior to dressing change. Cleanser: Vashe 5.8 (oz) Every Other Day/30 Days Discharge Instructions: Cleanse the wound with Vashe prior to applying a clean dressing using gauze sponges, not tissue or cotton balls. Peri-Wound Care: Triamcinolone 15 (g) Every Other Day/30 Days Discharge Instructions: Use triamcinolone 15 (g) as directed Peri-Wound Care: Sween Lotion (Moisturizing lotion) Every Other Day/30 Days Discharge Instructions: Apply moisturizing lotion as directed Topical: Gentamicin Every Other Day/30 Days Discharge Instructions: As directed by physician Topical: Mupirocin Ointment Every Other Day/30 Days Discharge Instructions: Apply Mupirocin (Bactroban) as instructed ELAINAH, RHYNE ([id number]) [id number] Page 9 of 11 Secondary  Dressing: T Non-adherent Dressing, 2x3 in Every Other Day/30 Days elfa Discharge Instructions: Apply over primary dressing as directed. Compression Wrap: Kerlix Roll 4.5x3.1 (in/yd) Every Other Day/30 Days Discharge Instructions: Apply Kerlix and Coban compression as directed. Compression Wrap: Coban Self-Adherent Wrap 4x5 (in/yd) Every Other Day/30 Days Discharge Instructions: Apply over Kerlix as directed. 08/10/2023: We are down to just 2 open areas at this point. They are both smaller, with a little bit of slough and eschar on each surface. I used a curette to debride the slough and eschar from the wounds. We will continue topical gentamicin and mupirocin with Telfa, Kerlix and Coban wrap. Follow-up in 1 month. Electronic Signature(s) Signed: 08/10/2023 11:34:47 AM By: Elaine Guess MD FACS Entered By: [name] on 08/10/2023 11:34:47 -------------------------------------------------------------------------------- HxROS Details Patient Name: Date of  Service: JOCI, DRESS RMA Long. 08/10/2023 10:45 A M Medical Record Number: [id number] Patient Account Number: [id number] Date of Birth/Sex: Treating RN: Oct 19, 1946 (77 y.o. F) Primary Care Provider: Dina Long Other Clinician: Referring Provider: Treating Provider/Extender: [name] in Treatment: 28 Information Obtained From Patient Caregiver Eyes Medical History: Positive for: Cataracts - bil removed Negative for: Glaucoma; Optic Neuritis Ear/Nose/Mouth/Throat Medical History: Negative for: Chronic sinus problems/congestion; Middle ear problems Hematologic/Lymphatic Medical History: Positive for: Anemia Past Medical History Notes: varicose vein of leg, mixed hyperlipidemia Respiratory Medical History: Positive for: Chronic Obstructive Pulmonary Disease (COPD) Past Medical History Notes: dyspnea, chronic bronchitis, pulmonary hypertension, pulmonary emphysema Cardiovascular Medical  History: Positive for: Coronary Artery Disease; Peripheral Venous Disease - varicose veins Past Medical History Notes: aortic calcification, tachycardia, left ventricular dysfunction, bradycardia Gastrointestinal Medical History: Past Medical History Notes: BLESS, LISENBY ([id number]) [id number] Page 10 of 11 Endocrine Medical History: Negative for: Type I Diabetes; Type II Diabetes Past Medical History Notes: prediabetes, hypothyroidism Genitourinary Medical History: Negative for: End Stage Renal Disease Past Medical History Notes: bladder disorder Immunological Medical History: Positive for: Raynauds Negative for: Lupus Erythematosus; Scleroderma Integumentary (Skin) Medical History: Negative for: History of Burn Past Medical History Notes: contact dermatitis Musculoskeletal Medical History: Positive for: Rheumatoid Arthritis; Osteoarthritis Negative for: Gout Past Medical History Notes: acquired plantar porokeratosis, neurogenic claudication d/t lumbar spinal stenosis, supraspinatus tendinitis, restless leg syndrome, spinal stenosis of lumbar region Neurologic Medical History: Past Medical History Notes: TIA, insomnia Oncologic Medical History: Negative for: Received Chemotherapy; Received Radiation Psychiatric Medical History: Negative for: Anorexia/bulimia; Confinement Anxiety HBO Extended History Items Eyes: Cataracts Immunizations Pneumococcal Vaccine: Received Pneumococcal Vaccination: Yes Received Pneumococcal Vaccination On or After 60th Birthday: Yes Implantable Devices None Hospitalization / Surgery History Type of Hospitalization/Surgery bil cataract removal left shoiulder replacement right rotator cuff repair multiple lumbar spine surgeries melanoma removed left arm and chest partial excision bone left 2nd toe Family and Social History Unknown History: Yes; Former smoker; Marital Status - Married; Alcohol  Use: Never; Drug Use: No History; Caffeine Use: Rarely; Financial Concerns: No; Food, Clothing or Shelter Needs: No; Support System Lacking: No; Transportation Concerns: No MINDA, FAAS ([id number]) [id number] Page 11 of 11 Electronic Signature(s) Signed: 08/10/2023 1:07:52 PM By: Elaine Guess MD FACS Entered By: [name] on 08/10/2023 11:33:32 -------------------------------------------------------------------------------- SuperBill Details Patient Name: Date of Service: Elaine Hurst RMA Long. 08/10/2023 Medical Record Number: [id number] Patient Account Number: [id number] Date of Birth/Sex: Treating RN: [birth year] (77 y.o. F) Primary Care Provider: Dina Long Other Clinician: Referring Provider: Treating Provider/Extender: [name] in Treatment: 28 Diagnosis Coding ICD-10 Codes Code Description [phone] Non-pressure chronic ulcer of other part of left lower leg with fat layer exposed I27.20 Pulmonary hypertension, unspecified I51.9 Heart disease, unspecified Facility Procedures : CPT4 Code: [id number] Description: 97597 - DEBRIDE WOUND 1ST 20 SQ CM OR < ICD-10 Diagnosis Description L97.822 Non-pressure chronic ulcer of other part of left lower leg with fat layer expose Modifier: d Quantity: 1 Physician Procedures : CPT4 Code Description Modifier [id number] - WC PHYS LEVEL 4 - EST PT 25 ICD-10 Diagnosis Description L97.822 Non-pressure chronic ulcer of other part of left lower leg with fat layer exposed I27.20 Pulmonary hypertension, unspecified I51.9 Heart  disease, unspecified Quantity: 1 : [id number] 97597 - WC PHYS DEBR WO ANESTH 20 SQ CM ICD-10 Diagnosis Description L97.822 Non-pressure chronic ulcer of other part of left lower leg with fat layer exposed Quantity: 1 Electronic Signature(s) Signed: 08/10/2023 11:35:02 AM  By: Elaine Guess MD FACS Entered By: [name] on 08/10/2023 11:35:02

## 2023-09-07 ENCOUNTER — Encounter (HOSPITAL_BASED_OUTPATIENT_CLINIC_OR_DEPARTMENT_OTHER): Payer: PPO | Attending: General Surgery | Admitting: General Surgery

## 2023-09-07 DIAGNOSIS — L97822 Non-pressure chronic ulcer of other part of left lower leg with fat layer exposed: Secondary | ICD-10-CM | POA: Diagnosis present

## 2023-09-07 DIAGNOSIS — I519 Heart disease, unspecified: Secondary | ICD-10-CM | POA: Insufficient documentation

## 2023-09-07 DIAGNOSIS — J449 Chronic obstructive pulmonary disease, unspecified: Secondary | ICD-10-CM | POA: Insufficient documentation

## 2023-09-07 DIAGNOSIS — I272 Pulmonary hypertension, unspecified: Secondary | ICD-10-CM | POA: Diagnosis not present

## 2023-09-07 NOTE — Progress Notes (Signed)
CHAD, SALVESEN ([id number]) [id number] Page 1 of 11 Visit Report for 09/07/2023 Chief Complaint Document Details Patient Name: Date of Service: [name], Elaine RMA Long. 09/07/2023 10:45 A M Medical Record Number: [id number] Patient Account Number: [id number] Date of Birth/Sex: Treating RN: 06/01/1946 (77 y.o. F) Primary Care Provider: Dina Rich Other Clinician: Referring Provider: Treating Provider/Extender: Pincus Badder in Treatment: 32 Information Obtained from: Patient Chief Complaint Patient seen for complaints of Non-Healing Wound. Electronic Signature(s) Signed: 09/07/2023 11:25:19 AM By: Duanne Guess MD FACS Entered By: Duanne Guess on 09/07/2023 08:25:19 -------------------------------------------------------------------------------- Debridement Details Patient Name: Date of Service: Elaine Hurst RMA Long. 09/07/2023 10:45 A M Medical Record Number: [id number] Patient Account Number: [id number] Date of Birth/Sex: Treating RN: 11-Jun-1946 (77 y.o. [name] Primary Care Provider: Dina Rich Other Clinician: Referring Provider: Treating Provider/Extender: Pincus Badder in Treatment: 32 Debridement Performed for Assessment: Wound #4 Left,Anterior Lower Leg Performed By: Physician Duanne Guess, MD The following information was scribed by: Samuella Bruin The information was scribed for: Duanne Guess Debridement Type: Debridement Severity of Tissue Pre Debridement: Fat layer exposed Level of Consciousness (Pre-procedure): Awake and Alert Pre-procedure Verification/Time Out Yes - 11:09 Taken: Start Time: 11:09 Pain Control: Lidocaine 4% Topical Solution Percent of Wound Bed Debrided: 100% T Area Debrided (cm): otal 0.05 Tissue and other material debrided: Non-Viable, Eschar, Slough, Subcutaneous, Slough Level: Skin/Subcutaneous Tissue Debridement Description:  Excisional Instrument: Curette Bleeding: Minimum Hemostasis Achieved: Pressure Response to Treatment: Procedure was tolerated well Level of Consciousness (Post- Awake and Alert procedure): Post Debridement Measurements of Total Wound Length: (cm) 0.2 Width: (cm) 0.3 Depth: (cm) 0.1 Volume: (cm) 0.005 Character of Wound/Ulcer Post Debridement: Improved Severity of Tissue Post Debridement: Fat layer exposed Aaronson, Jamey Long ([id number]) [id number] Page 2 of 11 Post Procedure Diagnosis Same as Pre-procedure Electronic Signature(s) Signed: 09/07/2023 12:14:19 PM By: Duanne Guess MD FACS Signed: 09/07/2023 4:15:11 PM By: Gelene Mink By: Samuella Bruin on 09/07/2023 08:10:49 -------------------------------------------------------------------------------- HPI Details Patient Name: Date of Service: Elaine Hurst RMA Long. 09/07/2023 10:45 A M Medical Record Number: [id number] Patient Account Number: [id number] Date of Birth/Sex: Treating RN: 1946/04/22 (77 y.o. F) Primary Care Provider: Dina Rich Other Clinician: Referring Provider: Treating Provider/Extender: Pincus Badder in Treatment: 32 History of Present Illness HPI Description: ADMISSION 02/19/2022 This is a 77 year old woman with minimal pertinent medical history. She does have COPD and pulmonary hypertension, but is not diabetic and is not a smoker. About 6 months ago, she and her husband got a new beagle puppy. The puppy scratched her leg and the scratches ultimately deteriorated into ulcerations. Apparently she sought care with a dermatologist who recommended applying a one-to-one mixture of peroxide and water to the wounds followed by a thick layer of Vaseline. She continue this for some time but then saw her primary care provider who told her to discontinue the peroxide. She has not been on any antibiotics for the scratches. Today, there are 3 separate wounds  on her anterior tibial surface on the left. They are tender and her leg has localized swelling. There is yellow slough buildup in each of the wound bases. ABI in clinic today was normal at 1.16. 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 02/25/2022: Over the past week, the wounds themselves have not improved tremendously but she has noticed some stinging and the periwound skin is quite inflamed. I took a culture last week that had low levels of methicillin-resistant Staph aureus. Because it  was fairly low level, I did not prescribe an oral antibiotic. I had planned to change her to mupirocin today, however. We have been using Santyl under Hydrofera Blue with 3 layer compression. 03/04/2022: The wounds have improved quite a bit over the past week. They are less painful and red. She has been taking the prescribed doxycycline but says that it gives her a stomachache. We have been using mupirocin with Hydrofera Blue and 3 layer compression. 03/11/2022: Continued improvement of the wounds. They have contracted quite a bit and have a good surface of healthy granulation tissue present. There is some dried eschar present around all 3 of the lesions. 03/17/2022: Elaine significant change in the wounds from last week. The surface is clean with just a little bit of eschar and slough accumulation. Elaine odor or drainage. Elaine concern for infection. 03/26/2022: All of her wounds are smaller this week. There is good granulation tissue on the surface of each. Elaine slough accumulation. There is just some dry skin around the wounds but not actually involving the wounds. Elaine concern for infection. 04/04/2022: The 2 proximal wounds have healed. The distal wound is much smaller and just has a little bit of dry eschar around the edges. 04/11/2022: Her wound is nearly healed. It is clean without concern for infection. 04/18/2022: Her wound has healed. READMISSION 01/26/2023 About 8 weeks ago, she sustained another  scratch from her Beagle on her left anterior tibial surface.. She has been trying to treat the wound at home with Vaseline. There is thick slough buildup on the wound surface and the periwound skin looks a bit macerated. Elaine overt sign of infection. 01/29/2023: She came in for an unscheduled visit today because her wound began bleeding. It apparently bled quite profusely and her husband used an over-the- counter topical agent to get it to stop. On inspection today, he managed to achieve good hemostasis and there is Elaine ongoing blood loss. 02/03/2023: Elaine further issues with bleeding. There is minimal slough accumulation. Some hypertrophic granulation tissue present. 02/11/2023: She has had a lot more drainage over the past week and there has been more breakdown of the skin around her wound. The wound itself has expanded and there is quite a bit of slough on the surfaces. 02/18/2023: Her wound looks much better this week. The drainage has decreased and the periwound skin is in much improved condition. The wound is smaller and there is just 1 tiny satellite adjacent to the main wound. There is slough accumulation on the surfaces. The culture that I took last week returned with methicillin sensitive Staph aureus. She is currently taking Keflex for this. 02/25/2023: Her wound is smaller again this week with minimal slough accumulation. The periwound looks significantly improved. She has completed her oral Keflex. 03/04/2023: The wound continues to contract. The periwound is completely healed and looks like normal intact skin. There is some slough on the surface of the wound overlying good granulation tissue. YULI, BEEM ([id number]) [id number] Page 3 of 11 03/11/2023: The wound measured about the same size today, although visually it appears smaller. There is a little bit of slough on the surface with good granulation tissue underneath. 03/17/2023: The wound is a little bit smaller today  with light slough on the wound surface. Elaine erythema, induration, or significant drainage. 03/30/2023: The wound is a little bit smaller again today with minimal slough and eschar accumulation. 04/06/2023: Her skin irritation from the Zetuvit dressing has gotten worse and she has had a fair amount  of skin breakdown in the distribution of the bandage. This is also caused some breakdown of the existing wound. In addition, when she was at a wedding this weekend, she fell and suffered a large skin tear to her proximal left lower leg and left knee. The fat layer is exposed. There is slough accumulation on the surfaces. 04/13/2023: All of the wounds have deteriorated. They are red and angry-looking. There is slough on all of the surfaces. 04/20/2023: There has been significant improvement since last week. The erythema and irritation have resolved. There is minimal slough on the wound surfaces along with a little bit of light periwound eschar. She continues on Keflex. 04/27/2023: Continued improvement of her wounds. All of them are smaller. The knee wound is nearly closed. There is slough and eschar on all of the wound surfaces. She has completed her course of oral antibiotics. 05/04/2023: The wounds are all smaller today with slough and eschar present on all of the surfaces. The moisture balance is leaning a bit towards the dry side, however. 05/12/2023: The wound on her knee is healed. The other skin tear at the tibial tuberosity is nearly closed with just a small open area. The lower leg wounds are about the same size. Moisture balance is better. There is slough on all of the open wound surfaces. 05/20/2023: The wounds measured a little bit smaller today. They have slough accumulation and the patient says everything is quite a bit more tender. There is more periwound erythema present today. 05/29/2023: The skin tears on her upper leg have healed. The lower wounds are all smaller with just some thin slough on the  surface. Her tenderness and erythema have resolved. She completed her course of Keflex yesterday. 06/04/2023: All of the lower leg wounds are smaller again today. There is Elaine erythema surrounding the wounds. Light slough on the surfaces. 06/18/2023: The lower leg wounds are stable. There is some slough on the surfaces. Both the patient and her husband are a little bit frustrated that she has not made more rapid improvement. 07/02/2023: Her wounds look better this week. The periwound erythema and irritation that have typically been present are not there today. There is slough on the wound surfaces. The small wound on the plantar surface of her great toe has contracted, as well. Very thin biofilm on the surface here. 07/20/2023: The wound on the plantar surface of her great toe is healed. Although the wound measurements were apparently the same, they appear smaller to me visually. There is slough and eschar accumulation. Elaine periwound erythema. 08/10/2023: We are down to just 2 open areas at this point. They are both smaller, with a little bit of slough and eschar on each surface. 09/07/2023: Her wounds are almost completely healed. There are just tiny residual openings with a little bit of slough and eschar present. The periwound skin is in much better condition. Electronic Signature(s) Signed: 09/07/2023 11:25:51 AM By: Duanne Guess MD FACS Entered By: Duanne Guess on 09/07/2023 08:25:51 -------------------------------------------------------------------------------- Physical Exam Details Patient Name: Date of Service: Elaine Hurst RMA Long. 09/07/2023 10:45 A M Medical Record Number: [id number] Patient Account Number: [id number] Date of Birth/Sex: Treating RN: March 30, 1946 (77 y.o. F) Primary Care Provider: Dina Rich Other Clinician: Referring Provider: Treating Provider/Extender: Pincus Badder in Treatment: 32 Constitutional Slightly hypertensive. Slightly tachycardic.  . . Elaine acute distress. Respiratory Normal work of breathing on room air.. Notes 09/07/2023: Her wounds are almost completely healed. There are just tiny residual openings with  a little bit of slough and eschar present. The periwound skin is in much better condition. Electronic Signature(s) Signed: 09/07/2023 11:26:27 AM By: Duanne Guess MD FACS Entered By: Duanne Guess on 09/07/2023 08:26:26 [name] ([id number]) [id number] Page 4 of 11 -------------------------------------------------------------------------------- Physician Orders Details Patient Name: Date of Service: [name], Elaine RMA Long. 09/07/2023 10:45 A M Medical Record Number: [id number] Patient Account Number: [id number] Date of Birth/Sex: Treating RN: [birth year] (77 y.o. [name] Primary Care Provider: Dina Rich Other Clinician: Referring Provider: Treating Provider/Extender: Pincus Badder in Treatment: 32 The following information was scribed by: Samuella Bruin The information was scribed for: Duanne Guess Verbal / Phone Orders: Elaine Diagnosis Coding Follow-up Appointments Return appointment in 1 month. - Dr. Lady Gary - room 2 Anesthetic (In clinic) Topical Lidocaine 4% applied to wound bed Bathing/ Shower/ Hygiene May shower and wash wound with soap and water. - with dressing changes Wound Treatment Wound #4 - Lower Leg Wound Laterality: Left, Anterior Cleanser: Soap and Water Every Other Day/30 Days Discharge Instructions: May shower and wash wound with dial antibacterial soap and water prior to dressing change. Cleanser: Vashe 5.8 (oz) Every Other Day/30 Days Discharge Instructions: Cleanse the wound with Vashe prior to applying a clean dressing using gauze sponges, not tissue or cotton balls. Peri-Wound Care: Triamcinolone 15 (g) Every Other Day/30 Days Discharge Instructions: Use triamcinolone 15 (g) as directed Peri-Wound Care: Sween  Lotion (Moisturizing lotion) Every Other Day/30 Days Discharge Instructions: Apply moisturizing lotion as directed Topical: Gentamicin Every Other Day/30 Days Discharge Instructions: As directed by physician Topical: Mupirocin Ointment Every Other Day/30 Days Discharge Instructions: Apply Mupirocin (Bactroban) as instructed Secondary Dressing: T Non-adherent Dressing, 2x3 in Every Other Day/30 Days elfa Discharge Instructions: Apply over primary dressing as directed. Compression Wrap: Kerlix Roll 4.5x3.1 (in/yd) Every Other Day/30 Days Discharge Instructions: Apply Kerlix and Coban compression as directed. Compression Wrap: Coban Self-Adherent Wrap 4x5 (in/yd) Every Other Day/30 Days Discharge Instructions: Apply over Kerlix as directed. Patient Medications llergies: Sulfa (Sulfonamide Antibiotics), adhesive, amoxicillin, ciprofloxacin, Statins-Hmg-Coa Reductase Inhibitors A Notifications Medication Indication Start End 09/07/2023 lidocaine DOSE topical 4 % cream - cream topical Electronic Signature(s) Signed: 09/07/2023 11:28:15 AM By: Duanne Guess MD FACS Entered By: Duanne Guess on 09/07/2023 08:28:15 [name] ([id number]) [id number] Page 5 of 11 -------------------------------------------------------------------------------- Problem List Details Patient Name: Date of Service: [name], Elaine RMA Long. 09/07/2023 10:45 A M Medical Record Number: [id number] Patient Account Number: [id number] Date of Birth/Sex: Treating RN: [birth year] (77 y.o. 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/2022 Elaine Yes I27.20 Pulmonary hypertension, unspecified 01/26/2023 Elaine Yes I51.9  Heart disease, unspecified 01/26/2023 Elaine Yes Inactive Problems Resolved Problems ICD-10 Code Description Active Date Resolved Date L97.122 Non-pressure chronic ulcer of left thigh with fat layer exposed 04/06/2023 04/06/2023 L97.522 Non-pressure chronic ulcer of other part of left foot with fat layer exposed 07/02/2023 07/02/2023 Electronic Signature(s) Signed: 09/07/2023 11:24:56 AM By: Duanne Guess MD FACS Entered By: Duanne Guess on 09/07/2023 08:24:56 -------------------------------------------------------------------------------- Progress Note Details Patient Name: Date of Service: Elaine Hurst RMA Long. 09/07/2023 10:45 A M Medical Record Number: [id number] Patient Account Number: [id number] Date of Birth/Sex: Treating RN: 1946-05-27 (77 y.o. F) Primary Care Provider: Dina Rich Other Clinician: Referring Provider: Treating Provider/Extender: Pincus Badder  in Treatment: 32 Subjective Chief Complaint Information obtained from Patient Patient seen for complaints of Non-Healing Wound. [name], [name] ([id number]) [id number] Page 6 of 11 History of Present Illness (HPI) ADMISSION 02/19/2022 This is a 76 year old woman with minimal pertinent medical history. She does have COPD and pulmonary hypertension, but is not diabetic and is not a smoker. About 6 months ago, she and her husband got a new beagle puppy. The puppy scratched her leg and the scratches ultimately deteriorated into ulcerations. Apparently she sought care with a dermatologist who recommended applying a one-to-one mixture of peroxide and water to the wounds followed by a thick layer of Vaseline. She continue this for some time but then saw her primary care provider who told her to discontinue the peroxide. She has not been on any antibiotics for the scratches. Today, there are 3 separate wounds on her anterior tibial surface on the left. They are tender and her leg has  localized swelling. There is yellow slough buildup in each of the wound bases. ABI in clinic today was normal at 1.16. 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 02/25/2022: Over the past week, the wounds themselves have not improved tremendously but she has noticed some stinging and the periwound skin is quite inflamed. I took a culture last week that had low levels of methicillin-resistant Staph aureus. Because it was fairly low level, I did not prescribe an oral antibiotic. I had planned to change her to mupirocin today, however. We have been using Santyl under Hydrofera Blue with 3 layer compression. 03/04/2022: The wounds have improved quite a bit over the past week. They are less painful and red. She has been taking the prescribed doxycycline but says that it gives her a stomachache. We have been using mupirocin with Hydrofera Blue and 3 layer compression. 03/11/2022: Continued improvement of the wounds. They have contracted quite a bit and have a good surface of healthy granulation tissue present. There is some dried eschar present around all 3 of the lesions. 03/17/2022: Elaine significant change in the wounds from last week. The surface is clean with just a little bit of eschar and slough accumulation. Elaine odor or drainage. Elaine concern for infection. 03/26/2022: All of her wounds are smaller this week. There is good granulation tissue on the surface of each. Elaine slough accumulation. There is just some dry skin around the wounds but not actually involving the wounds. Elaine concern for infection. 04/04/2022: The 2 proximal wounds have healed. The distal wound is much smaller and just has a little bit of dry eschar around the edges. 04/11/2022: Her wound is nearly healed. It is clean without concern for infection. 04/18/2022: Her wound has healed. READMISSION 01/26/2023 About 8 weeks ago, she sustained another scratch from her Beagle on her left anterior tibial surface.. She has been  trying to treat the wound at home with Vaseline. There is thick slough buildup on the wound surface and the periwound skin looks a bit macerated. Elaine overt sign of infection. 01/29/2023: She came in for an unscheduled visit today because her wound began bleeding. It apparently bled quite profusely and her husband used an over-the- counter topical agent to get it to stop. On inspection today, he managed to achieve good hemostasis and there is Elaine ongoing blood loss. 02/03/2023: Elaine further issues with bleeding. There is minimal slough accumulation. Some hypertrophic granulation tissue present. 02/11/2023: She has had a lot more drainage over the past week and there has been more breakdown  of the skin around her wound. The wound itself has expanded and there is quite a bit of slough on the surfaces. 02/18/2023: Her wound looks much better this week. The drainage has decreased and the periwound skin is in much improved condition. The wound is smaller and there is just 1 tiny satellite adjacent to the main wound. There is slough accumulation on the surfaces. The culture that I took last week returned with methicillin sensitive Staph aureus. She is currently taking Keflex for this. 02/25/2023: Her wound is smaller again this week with minimal slough accumulation. The periwound looks significantly improved. She has completed her oral Keflex. 03/04/2023: The wound continues to contract. The periwound is completely healed and looks like normal intact skin. There is some slough on the surface of the wound overlying good granulation tissue. 03/11/2023: The wound measured about the same size today, although visually it appears smaller. There is a little bit of slough on the surface with good granulation tissue underneath. 03/17/2023: The wound is a little bit smaller today with light slough on the wound surface. Elaine erythema, induration, or significant drainage. 03/30/2023: The wound is a little bit smaller again today with  minimal slough and eschar accumulation. 04/06/2023: Her skin irritation from the Zetuvit dressing has gotten worse and she has had a fair amount of skin breakdown in the distribution of the bandage. This is also caused some breakdown of the existing wound. In addition, when she was at a wedding this weekend, she fell and suffered a large skin tear to her proximal left lower leg and left knee. The fat layer is exposed. There is slough accumulation on the surfaces. 04/13/2023: All of the wounds have deteriorated. They are red and angry-looking. There is slough on all of the surfaces. 04/20/2023: There has been significant improvement since last week. The erythema and irritation have resolved. There is minimal slough on the wound surfaces along with a little bit of light periwound eschar. She continues on Keflex. 04/27/2023: Continued improvement of her wounds. All of them are smaller. The knee wound is nearly closed. There is slough and eschar on all of the wound surfaces. She has completed her course of oral antibiotics. 05/04/2023: The wounds are all smaller today with slough and eschar present on all of the surfaces. The moisture balance is leaning a bit towards the dry side, however. 05/12/2023: The wound on her knee is healed. The other skin tear at the tibial tuberosity is nearly closed with just a small open area. The lower leg wounds are about the same size. Moisture balance is better. There is slough on all of the open wound surfaces. 05/20/2023: The wounds measured a little bit smaller today. They have slough accumulation and the patient says everything is quite a bit more tender. There is more periwound erythema present today. 05/29/2023: The skin tears on her upper leg have healed. The lower wounds are all smaller with just some thin slough on the surface. Her tenderness and erythema have resolved. She completed her course of Keflex yesterday. 06/04/2023: All of the lower leg wounds are smaller again  today. There is Elaine erythema surrounding the wounds. Light slough on the surfaces. 06/18/2023: The lower leg wounds are stable. There is some slough on the surfaces. Both the patient and her husband are a little bit frustrated that she has not made more rapid improvement. [name], [name] ([id number]) [id number] Page 7 of 11 07/02/2023: Her wounds look better this week. The periwound erythema and irritation that  have typically been present are not there today. There is slough on the wound surfaces. The small wound on the plantar surface of her great toe has contracted, as well. Very thin biofilm on the surface here. 07/20/2023: The wound on the plantar surface of her great toe is healed. Although the wound measurements were apparently the same, they appear smaller to me visually. There is slough and eschar accumulation. Elaine periwound erythema. 08/10/2023: We are down to just 2 open areas at this point. They are both smaller, with a little bit of slough and eschar on each surface. 09/07/2023: Her wounds are almost completely healed. There are just tiny residual openings with a little bit of slough and eschar present. The periwound skin is in much better condition. Patient History Information obtained from Patient, Caregiver. Family History Unknown History. Social History Former smoker, Marital Status - Married, Alcohol Use - Never, Drug Use - Elaine History, Caffeine Use - Rarely. Medical History Eyes Patient has history of Cataracts - bil removed Denies history of Glaucoma, Optic Neuritis Ear/Nose/Mouth/Throat Denies history of Chronic sinus problems/congestion, Middle ear problems Hematologic/Lymphatic Patient has history of Anemia Respiratory Patient has history of Chronic Obstructive Pulmonary Disease (COPD) Cardiovascular Patient has history of Coronary Artery Disease, Peripheral Venous Disease - varicose veins Endocrine Denies history of Type I Diabetes, Type II  Diabetes Genitourinary Denies history of End Stage Renal Disease Immunological Patient has history of Raynauds Denies history of Lupus Erythematosus, Scleroderma Integumentary (Skin) Denies history of History of Burn Musculoskeletal Patient has history of Rheumatoid Arthritis, Osteoarthritis Denies history of Gout Oncologic Denies history of Received Chemotherapy, Received Radiation Psychiatric Denies history of Anorexia/bulimia, Confinement Anxiety Hospitalization/Surgery History - bil cataract removal. - left shoiulder replacement. - right rotator cuff repair. - multiple lumbar spine surgeries. - melanoma removed left arm and chest. - partial excision bone left 2nd toe. Medical A Surgical History Notes nd Hematologic/Lymphatic varicose vein of leg, mixed hyperlipidemia Respiratory dyspnea, chronic bronchitis, pulmonary hypertension, pulmonary emphysema Cardiovascular aortic calcification, tachycardia, left ventricular dysfunction, bradycardia Gastrointestinal Gerd Endocrine prediabetes, hypothyroidism Genitourinary bladder disorder Integumentary (Skin) contact dermatitis Musculoskeletal acquired plantar porokeratosis, neurogenic claudication d/t lumbar spinal stenosis, supraspinatus tendinitis, restless leg syndrome, spinal stenosis of lumbar region Neurologic TIA, insomnia Objective Constitutional Slightly hypertensive. Slightly tachycardic. Elaine acute distress. Vitals Time Taken: 10:46 AM, Height: 63 in, Weight: 115 lbs, BMI: 20.4, Temperature: 98.7 F, Pulse: 101 bpm, Respiratory Rate: 18 breaths/min, Blood Frith, Jayelyn Long ([id number]) [phone].pdf Page 8 of 11 Pressure: 140/58 mmHg. Respiratory Normal work of breathing on room air.. General Notes: 09/07/2023: Her wounds are almost completely healed. There are just tiny residual openings with a little bit of slough and eschar present. The periwound skin is in much better  condition. Integumentary (Hair, Skin) Wound #4 status is Open. Original cause of wound was Trauma. The date acquired was: 11/27/2022. The wound has been in treatment 32 weeks. The wound is located on the Left,Anterior Lower Leg. The wound measures 0.2cm length x 0.3cm width x 0.1cm depth; 0.047cm^2 area and 0.005cm^3 volume. There is Fat Layer (Subcutaneous Tissue) exposed. There is Elaine tunneling or undermining noted. There is a medium amount of serosanguineous drainage noted. The wound margin is distinct with the outline attached to the wound base. There is small (1-33%) red granulation within the wound bed. There is a medium (34-66%) amount of necrotic tissue within the wound bed including Eschar and Adherent Slough. The periwound skin appearance had Elaine abnormalities noted for texture. The periwound skin appearance had  Elaine abnormalities noted for moisture. The periwound skin appearance exhibited: Hemosiderin Staining. The periwound skin appearance did not exhibit: Atrophie Blanche, Cyanosis, Ecchymosis, Mottled, Pallor, Rubor, Erythema. Periwound temperature was noted as Elaine Abnormality. Assessment Active Problems ICD-10 Non-pressure chronic ulcer of other part of left lower leg with fat layer exposed Pulmonary hypertension, unspecified Heart disease, unspecified Procedures Wound #4 Pre-procedure diagnosis of Wound #4 is a Venous Leg Ulcer located on the Left,Anterior Lower Leg .Severity of Tissue Pre Debridement is: Fat layer exposed. There was a Excisional Skin/Subcutaneous Tissue Debridement with a total area of 0.05 sq cm performed by Duanne Guess, MD. With the following instrument(s): Curette to remove Non-Viable tissue/material. Material removed includes Eschar, Subcutaneous Tissue, and Slough after achieving pain control using Lidocaine 4% T opical Solution. Elaine specimens were taken. A time out was conducted at 11:09, prior to the start of the procedure. A Minimum amount of bleeding was  controlled with Pressure. The procedure was tolerated well. Post Debridement Measurements: 0.2cm length x 0.3cm width x 0.1cm depth; 0.005cm^3 volume. Character of Wound/Ulcer Post Debridement is improved. Severity of Tissue Post Debridement is: Fat layer exposed. Post procedure Diagnosis Wound #4: Same as Pre-Procedure Plan Follow-up Appointments: Return appointment in 1 month. - Dr. Lady Gary - room 2 Anesthetic: (In clinic) Topical Lidocaine 4% applied to wound bed Bathing/ Shower/ Hygiene: May shower and wash wound with soap and water. - with dressing changes The following medication(s) was prescribed: lidocaine topical 4 % cream cream topical was prescribed at facility WOUND #4: - Lower Leg Wound Laterality: Left, Anterior Cleanser: Soap and Water Every Other Day/30 Days Discharge Instructions: May shower and wash wound with dial antibacterial soap and water prior to dressing change. Cleanser: Vashe 5.8 (oz) Every Other Day/30 Days Discharge Instructions: Cleanse the wound with Vashe prior to applying a clean dressing using gauze sponges, not tissue or cotton balls. Peri-Wound Care: Triamcinolone 15 (g) Every Other Day/30 Days Discharge Instructions: Use triamcinolone 15 (g) as directed Peri-Wound Care: Sween Lotion (Moisturizing lotion) Every Other Day/30 Days Discharge Instructions: Apply moisturizing lotion as directed Topical: Gentamicin Every Other Day/30 Days Discharge Instructions: As directed by physician Topical: Mupirocin Ointment Every Other Day/30 Days Discharge Instructions: Apply Mupirocin (Bactroban) as instructed Secondary Dressing: T Non-adherent Dressing, 2x3 in Every Other Day/30 Days elfa Discharge Instructions: Apply over primary dressing as directed. Com pression Wrap: Kerlix Roll 4.5x3.1 (in/yd) Every Other Day/30 Days Discharge Instructions: Apply Kerlix and Coban compression as directed. Com pression Wrap: Coban Self-Adherent Wrap 4x5 (in/yd) Every Other  Day/30 Days Discharge Instructions: Apply over Kerlix as directed. 09/07/2023: Her wounds are almost completely healed. There are just tiny residual openings with a little bit of slough and eschar present. The periwound skin is in much better condition. [name], [name] ([id number]) [id number] Page 9 of 11 I used a curette to debride slough, eschar, and subcutaneous tissue from her wounds. She is responding well to the current treatment, so we will continue with the mixture of topical gentamicin and mupirocin with Telfa, Kerlix and Coban. Follow-up in 1 month. I expect she will be completely healed at that point. Electronic Signature(s) Signed: 09/07/2023 11:29:04 AM By: Duanne Guess MD FACS Entered By: Duanne Guess on 09/07/2023 08:29:03 -------------------------------------------------------------------------------- HxROS Details Patient Name: Date of Service: [name], Elaine RMA Long. 09/07/2023 10:45 A M Medical Record Number: [id number] Patient Account Number: [id number] Date of Birth/Sex: Treating RN: Oct 24, 1946 (77 y.o. F) Primary Care Provider: Dina Rich Other Clinician: Referring Provider: Treating Provider/Extender: Ethelene Hal  Weeks in Treatment: 32 Information Obtained From Patient Caregiver Eyes Medical History: Positive for: Cataracts - bil removed Negative for: Glaucoma; Optic Neuritis Ear/Nose/Mouth/Throat Medical History: Negative for: Chronic sinus problems/congestion; Middle ear problems Hematologic/Lymphatic Medical History: Positive for: Anemia Past Medical History Notes: varicose vein of leg, mixed hyperlipidemia Respiratory Medical History: Positive for: Chronic Obstructive Pulmonary Disease (COPD) Past Medical History Notes: dyspnea, chronic bronchitis, pulmonary hypertension, pulmonary emphysema Cardiovascular Medical History: Positive for: Coronary Artery Disease; Peripheral Venous Disease - varicose  veins Past Medical History Notes: aortic calcification, tachycardia, left ventricular dysfunction, bradycardia Gastrointestinal Medical History: Past Medical History Notes: Gerd Endocrine Medical History: Negative for: Type I Diabetes; Type II Diabetes Past Medical History Notes: prediabetes, hypothyroidism Genitourinary Medical History: Negative for: End Stage Renal Disease [name], [name] ([id number]) [id number] Page 10 of 11 Past Medical History Notes: bladder disorder Immunological Medical History: Positive for: Raynauds Negative for: Lupus Erythematosus; Scleroderma Integumentary (Skin) Medical History: Negative for: History of Burn Past Medical History Notes: contact dermatitis Musculoskeletal Medical History: Positive for: Rheumatoid Arthritis; Osteoarthritis Negative for: Gout Past Medical History Notes: acquired plantar porokeratosis, neurogenic claudication d/t lumbar spinal stenosis, supraspinatus tendinitis, restless leg syndrome, spinal stenosis of lumbar region Neurologic Medical History: Past Medical History Notes: TIA, insomnia Oncologic Medical History: Negative for: Received Chemotherapy; Received Radiation Psychiatric Medical History: Negative for: Anorexia/bulimia; Confinement Anxiety HBO Extended History Items Eyes: Cataracts Immunizations Pneumococcal Vaccine: Received Pneumococcal Vaccination: Yes Received Pneumococcal Vaccination On or After 60th Birthday: Yes Implantable Devices None Hospitalization / Surgery History Type of Hospitalization/Surgery bil cataract removal left shoiulder replacement right rotator cuff repair multiple lumbar spine surgeries melanoma removed left arm and chest partial excision bone left 2nd toe Family and Social History Unknown History: Yes; Former smoker; Marital Status - Married; Alcohol Use: Never; Drug Use: Elaine History; Caffeine Use: Rarely; Financial Concerns: Elaine; Food,  Clothing or Shelter Needs: Elaine; Support System Lacking: Elaine; Transportation Concerns: Elaine Electronic Signature(s) Signed: 09/07/2023 12:14:19 PM By: Duanne Guess MD FACS Entered By: Duanne Guess on 09/07/2023 08:25:59 [name], [name] ([id number]) [id number] Page 11 of 11 -------------------------------------------------------------------------------- SuperBill Details Patient Name: Date of Service: AKAILA, PALANCA RMA Long. 09/07/2023 Medical Record Number: [id number] Patient Account Number: [id number] Date of Birth/Sex: Treating RN: [birth year] (77 y.o. F) Primary Care Provider: Dina Rich Other Clinician: Referring Provider: Treating Provider/Extender: Pincus Badder in Treatment: 32 Diagnosis Coding ICD-10 Codes Code Description [phone] Non-pressure chronic ulcer of other part of left lower leg with fat layer exposed I27.20 Pulmonary hypertension, unspecified I51.9 Heart disease, unspecified Facility Procedures : CPT4 Code: [id number] Description: 11042 - DEB SUBQ TISSUE 20 SQ CM/< ICD-10 Diagnosis Description L97.822 Non-pressure chronic ulcer of other part of left lower leg with fat layer expo Modifier: sed Quantity: 1 Physician Procedures : CPT4 Code Description Modifier [id number] - WC PHYS LEVEL 4 - EST PT ICD-10 Diagnosis Description L97.822 Non-pressure chronic ulcer of other part of left lower leg with fat layer exposed I27.20 Pulmonary hypertension, unspecified I51.9 Heart  disease, unspecified Quantity: 1 : [id number] 11042 - WC PHYS SUBQ TISS 20 SQ CM ICD-10 Diagnosis Description L97.822 Non-pressure chronic ulcer of other part of left lower leg with fat layer exposed Quantity: 1 Electronic Signature(s) Signed: 09/07/2023 11:29:18 AM By: Duanne Guess MD FACS Entered By: Duanne Guess on 09/07/2023 08:29:18

## 2023-09-07 NOTE — Progress Notes (Signed)
[name], [name] ([id number]) [id number].pdf Page 1 of 8 Visit Report for 09/07/2023 Arrival Information Details Patient Name: Date of Service: [name], [name] RMA Long. 09/07/2023 10:45 A M Medical Record Number: [id number] Patient Account Number: [id number] Date of Birth/Sex: Treating RN: 1946-05-03 (77 y.o. F) Primary Care Cia Garretson: Dina Rich Other Clinician: Referring Cledith Abdou: Treating Ekansh Sherk/Extender: Pincus Badder in Treatment: 32 Visit Information History Since Last Visit Added or deleted any medications: No Patient Arrived: Cane Any new allergies or adverse reactions: No Arrival Time: 10:45 Had a fall or experienced change in No Accompanied By: husband activities of daily living that may affect Transfer Assistance: None risk of falls: Patient Identification Verified: Yes Signs or symptoms of abuse/neglect since last visito No Secondary Verification Process Completed: Yes Hospitalized since last visit: No Patient Requires Transmission-Based Precautions: No Implantable device outside of the clinic excluding No Patient Has Alerts: Yes cellular tissue based products placed in the center Patient Alerts: ABI L 1.16 02/19/22 since last visit: Pain Present Now: No Electronic Signature(s) Signed: 09/07/2023 3:24:46 PM By: Dayton Scrape Entered By: Dayton Scrape on 09/07/2023 07:46:16 -------------------------------------------------------------------------------- Encounter Discharge Information Details Patient Name: Date of Service: [name]. 09/07/2023 10:45 A M Medical Record Number: [id number] Patient Account Number: [id number] Date of Birth/Sex: Treating RN: [birth year] (77 y.o. [name] Primary Care Tashea Othman: Dina Rich Other Clinician: Referring Benjerman Molinelli: Treating Consuella Scurlock/Extender: Pincus Badder in Treatment: 32 Encounter Discharge Information Items Post Procedure Vitals Discharge  Condition: Stable Temperature (F): 98.7 Ambulatory Status: Ambulatory Pulse (bpm): 101 Discharge Destination: Home Respiratory Rate (breaths/min): 18 Transportation: Private Auto Blood Pressure (mmHg): 140/58 Accompanied By: self Schedule Follow-up Appointment: Yes Clinical Summary of Care: Patient Declined Electronic Signature(s) Signed: 09/07/2023 4:15:11 PM By: Samuella Bruin Entered By: Samuella Bruin on 09/07/2023 08:20:30 Katzman, [name] ([id number]) [id number].pdf Page 2 of 8 -------------------------------------------------------------------------------- Lower Extremity Assessment Details Patient Name: Date of Service: [name], [name] RMA Long. 09/07/2023 10:45 A M Medical Record Number: [id number] Patient Account Number: [id number] Date of Birth/Sex: Treating RN: Feb 27, 1946 (77 y.o. [name] Primary Care Dezhane Staten: Dina Rich Other Clinician: Referring Kinley Dozier: Treating Khari Mally/Extender: Pincus Badder in Treatment: 32 Edema Assessment Assessed: [name]: No] [name]: No] Edema: [Left: N] [Right: o] Calf Left: Right: Point of Measurement: 25 cm From Medial Instep 31 cm Ankle Left: Right: Point of Measurement: 10 cm From Medial Instep 19 cm Vascular Assessment Pulses: Dorsalis Pedis Palpable: [Left:Yes] Extremity colors, hair growth, and conditions: Extremity Color: [Left:Hyperpigmented] Hair Growth on Extremity: [Left:No] Temperature of Extremity: [Left:Warm < 3 seconds] Electronic Signature(s) Signed: 09/07/2023 4:15:11 PM By: Samuella Bruin Entered By: Samuella Bruin on 09/07/2023 07:55:06 -------------------------------------------------------------------------------- Multi Wound Chart Details Patient Name: Date of Service: [name]. 09/07/2023 10:45 A M Medical Record Number: [id number] Patient Account Number: [id number] Date of Birth/Sex: Treating RN: 10-03-1946 (77 y.o. F) Primary Care  Jorah Hua: Dina Rich Other Clinician: Referring Jamell Laymon: Treating Jeremian Whitby/Extender: Pincus Badder in Treatment: 32 Vital Signs Height(in): 63 Pulse(bpm): 101 Weight(lbs): 115 Blood Pressure(mmHg): 140/58 Body Mass Index(BMI): 20.4 Temperature(F): 98.7 Respiratory Rate(breaths/min): 18 [4:Photos:] [N/A:N/A] Left, Anterior Lower Leg N/A N/A Wound Location: Trauma N/A N/A Wounding Event: Venous Leg Ulcer N/A N/A Primary Etiology: Cataracts, Anemia, Chronic N/A N/A Comorbid History: Obstructive Pulmonary Disease (COPD), Coronary Artery Disease, Peripheral Venous Disease, Raynauds, Rheumatoid Arthritis, Osteoarthritis 11/27/2022 N/A N/A Date Acquired: 64 N/A N/A Weeks of Treatment: Open N/A N/A Wound Status: No N/A N/A Wound Recurrence: Yes N/A N/A Clustered  Wound: 0 N/A N/A Clustered Quantity: 0.2x0.3x0.1 N/A N/A Measurements L x W x D (cm) 0.047 N/A N/A A (cm) : rea 0.005 N/A N/A Volume (cm) : 99.50% N/A N/A % Reduction in A rea: 99.40% N/A N/A % Reduction in Volume: Full Thickness Without Exposed N/A N/A Classification: Support Structures Medium N/A N/A Exudate A mount: Serosanguineous N/A N/A Exudate Type: red, brown N/A N/A Exudate Color: Distinct, outline attached N/A N/A Wound Margin: Small (1-33%) N/A N/A Granulation A mount: Red N/A N/A Granulation Quality: Medium (34-66%) N/A N/A Necrotic A mount: Eschar, Adherent Slough N/A N/A Necrotic Tissue: Fat Layer (Subcutaneous Tissue): Yes N/A N/A Exposed Structures: Fascia: No Tendon: No Muscle: No Joint: No Bone: No Large (67-100%) N/A N/A Epithelialization: Debridement - Excisional N/A N/A Debridement: Pre-procedure Verification/Time Out 11:09 N/A N/A Taken: Lidocaine 4% Topical Solution N/A N/A Pain Control: Necrotic/Eschar, Subcutaneous, N/A N/A Tissue Debrided: Slough Skin/Subcutaneous Tissue N/A N/A Level: 0.05 N/A N/A Debridement A (sq  cm): rea Curette N/A N/A Instrument: Minimum N/A N/A Bleeding: Pressure N/A N/A Hemostasis Achieved: Debridement Treatment Response: Procedure was tolerated well N/A N/A Post Debridement Measurements L x 0.2x0.3x0.1 N/A N/A W x D (cm) 0.005 N/A N/A Post Debridement Volume: (cm) Scarring: Yes N/A N/A Periwound Skin Texture: Excoriation: No Induration: No Callus: No Crepitus: No Rash: No Maceration: Yes N/A N/A Periwound Skin Moisture: Dry/Scaly: No Hemosiderin Staining: Yes N/A N/A Periwound Skin Color: Atrophie Blanche: No Cyanosis: No Ecchymosis: No Erythema: No Mottled: No Pallor: No Rubor: No No Abnormality N/A N/A Temperature: Debridement N/A N/A Procedures Performed: Treatment Notes Wound #4 (Lower Leg) Wound Laterality: Left, Anterior Cleanser Soap and Water Discharge Instruction: May shower and wash wound with dial antibacterial soap and water prior to dressing change. Vashe 5.8 (oz) ICELYN, EICHE Long ([id number]) [id number].pdf Page 4 of 8 Discharge Instruction: Cleanse the wound with Vashe prior to applying a clean dressing using gauze sponges, not tissue or cotton balls. Peri-Wound Care Triamcinolone 15 (g) Discharge Instruction: Use triamcinolone 15 (g) as directed Sween Lotion (Moisturizing lotion) Discharge Instruction: Apply moisturizing lotion as directed Topical Gentamicin Discharge Instruction: As directed by physician Mupirocin Ointment Discharge Instruction: Apply Mupirocin (Bactroban) as instructed Primary Dressing Secondary Dressing T Non-adherent Dressing, 2x3 in elfa Discharge Instruction: Apply over primary dressing as directed. Secured With Compression Wrap Kerlix Roll 4.5x3.1 (in/yd) Discharge Instruction: Apply Kerlix and Coban compression as directed. Coban Self-Adherent Wrap 4x5 (in/yd) Discharge Instruction: Apply over Kerlix as directed. Compression Stockings Add-Ons Electronic Signature(s) Signed:  09/07/2023 11:25:13 AM By: Duanne Guess MD FACS Entered By: Duanne Guess on 09/07/2023 08:25:13 -------------------------------------------------------------------------------- Multi-Disciplinary Care Plan Details Patient Name: Date of Service: [name]. 09/07/2023 10:45 A M Medical Record Number: [id number] Patient Account Number: [id number] Date of Birth/Sex: Treating RN: [birth year] (77 y.o. [name] Primary Care Shelia Magallon: Dina Rich Other Clinician: Referring Judy Pollman: Treating Zamira Hickam/Extender: Pincus Badder in Treatment: 32 Active Inactive Nutrition Nursing Diagnoses: Potential for alteratiion in Nutrition/Potential for imbalanced nutrition Goals: Patient/caregiver agrees to and verbalizes understanding of need to obtain nutritional consultation Date Initiated: 01/26/2023 Target Resolution Date: 10/23/2023 Goal Status: Active Patient/caregiver will maintain therapeutic glucose control Date Initiated: 01/26/2023 Target Resolution Date: 10/23/2023 Goal Status: Active Interventions: Assess patient nutrition upon admission and as needed per policy Provide education on nutrition [name], [name] ([id number]) [phone].pdf Page 5 of 8 Treatment Activities: Giving encouragement to exercise : 01/26/2023 Notes: Electronic Signature(s) Signed: 09/07/2023 4:15:11 PM By: Samuella Bruin Entered By: Samuella Bruin on 09/07/2023 08:11:22 --------------------------------------------------------------------------------  Pain Assessment Details Patient Name: Date of Service: [name], [name] RMA Long. 09/07/2023 10:45 A M Medical Record Number: [id number] Patient Account Number: [id number] Date of Birth/Sex: Treating RN: Feb 17, 1946 (77 y.o. F) Primary Care Lucian Baswell: Dina Rich Other Clinician: Referring Rocio Wolak: Treating Shaun Runyon/Extender: Pincus Badder in Treatment: 32 Active Problems Location  of Pain Severity and Description of Pain Patient Has Paino No Site Locations Pain Management and Medication Current Pain Management: Electronic Signature(s) Signed: 09/07/2023 3:24:46 PM By: Dayton Scrape Entered By: Dayton Scrape on 09/07/2023 07:46:45 -------------------------------------------------------------------------------- Patient/Caregiver Education Details Patient Name: Date of Service: [name]. 11/11/2024andnbsp10:45 A M Medical Record Number: [id number] Patient Account Number: [id number] Date of Birth/Gender: Treating RN: 1945/12/03 (77 y.o. [name] Primary Care Physician: Dina Rich Other Clinician: Referring Physician: Treating Physician/Extender: Pincus Badder in Treatment: [address], [place] Long ([id number]) [id number] Page 6 of 8 Education Assessment Education Provided To: Patient Education Topics Provided Wound/Skin Impairment: Methods: Explain/Verbal Responses: Reinforcements needed, State content correctly Electronic Signature(s) Signed: 09/07/2023 4:15:11 PM By: Samuella Bruin Entered By: Samuella Bruin on 09/07/2023 08:11:34 -------------------------------------------------------------------------------- Wound Assessment Details Patient Name: Date of Service: [name]. 09/07/2023 10:45 A M Medical Record Number: [id number] Patient Account Number: [id number] Date of Birth/Sex: Treating RN: Jul 19, 1946 (77 y.o. F) Primary Care Stefan Karen: Dina Rich Other Clinician: Referring Sayde Lish: Treating Taunja Brickner/Extender: Pincus Badder in Treatment: 32 Wound Status Wound Number: 4 Primary Venous Leg Ulcer Etiology: Wound Location: Left, Anterior Lower Leg Wound Open Wounding Event: Trauma Status: Date Acquired: 11/27/2022 Comorbid Cataracts, Anemia, Chronic Obstructive Pulmonary Disease Weeks Of Treatment: 32 History: (COPD), Coronary Artery Disease,  Peripheral Venous Disease, Clustered Wound: Yes Raynauds, Rheumatoid Arthritis, Osteoarthritis Photos Wound Measurements Length: (cm) Width: (cm) Depth: (cm) Clustered Quantity: Area: (cm) Volume: (cm) 0.2 % Reduction in Area: 99.5% 0.3 % Reduction in Volume: 99.4% 0.1 Epithelialization: Large (67-100%) 0 Tunneling: No 0.047 Undermining: No 0.005 Wound Description Classification: Full Thickness Without Exposed Sup Wound Margin: Distinct, outline attached Exudate Amount: Medium Exudate Type: Serosanguineous Exudate Color: red, brown port Structures Foul Odor After Cleansing: No Slough/Fibrino Yes Wound Bed Granulation Amount: Small (1-33%) Exposed [name], [name] ([id number]) [id number].pdf Page 7 of 8 Granulation Quality: Red Fascia Exposed: No Necrotic Amount: Medium (34-66%) Fat Layer (Subcutaneous Tissue) Exposed: Yes Necrotic Quality: Eschar, Adherent Slough Tendon Exposed: No Muscle Exposed: No Joint Exposed: No Bone Exposed: No Periwound Skin Texture Texture Color No Abnormalities Noted: Yes No Abnormalities Noted: No Atrophie Blanche: No Moisture Cyanosis: No No Abnormalities Noted: Yes Ecchymosis: No Erythema: No Hemosiderin Staining: Yes Mottled: No Pallor: No Rubor: No Temperature / Pain Temperature: No Abnormality Treatment Notes Wound #4 (Lower Leg) Wound Laterality: Left, Anterior Cleanser Soap and Water Discharge Instruction: May shower and wash wound with dial antibacterial soap and water prior to dressing change. Vashe 5.8 (oz) Discharge Instruction: Cleanse the wound with Vashe prior to applying a clean dressing using gauze sponges, not tissue or cotton balls. Peri-Wound Care Triamcinolone 15 (g) Discharge Instruction: Use triamcinolone 15 (g) as directed Sween Lotion (Moisturizing lotion) Discharge Instruction: Apply moisturizing lotion as directed Topical Gentamicin Discharge Instruction: As directed by  physician Mupirocin Ointment Discharge Instruction: Apply Mupirocin (Bactroban) as instructed Primary Dressing Secondary Dressing T Non-adherent Dressing, 2x3 in elfa Discharge Instruction: Apply over primary dressing as directed. Secured With Compression Wrap Kerlix Roll 4.5x3.1 (in/yd) Discharge Instruction: Apply Kerlix and Coban compression as directed. Coban Self-Adherent Wrap 4x5 (in/yd) Discharge Instruction: Apply over Kerlix as  directed. Compression Stockings Add-Ons Electronic Signature(s) Signed: 09/07/2023 4:15:11 PM By: Samuella Bruin Entered By: Samuella Bruin on 09/07/2023 07:56:14 [name], [name] ([id number]) [id number] Page 8 of 8 -------------------------------------------------------------------------------- Vitals Details Patient Name: Date of Service: [name], [name] RMA Long. 09/07/2023 10:45 A M Medical Record Number: [id number] Patient Account Number: [id number] Date of Birth/Sex: Treating RN: [birth year] (77 y.o. F) Primary Care Maritza Goldsborough: Dina Rich Other Clinician: Referring Gonsalo Cuthbertson: Treating Denita Lun/Extender: Pincus Badder in Treatment: 32 Vital Signs Time Taken: 10:46 Temperature (F): 98.7 Height (in): 63 Pulse (bpm): 101 Weight (lbs): 115 Respiratory Rate (breaths/min): 18 Body Mass Index (BMI): 20.4 Blood Pressure (mmHg): 140/58 Reference Range: 80 - 120 mg / dl Electronic Signature(s) Signed: 09/07/2023 3:24:46 PM By: Dayton Scrape Entered By: Dayton Scrape on 09/07/2023 07:46:38

## 2023-09-29 ENCOUNTER — Encounter: Payer: Self-pay | Admitting: Hematology and Oncology

## 2023-09-29 ENCOUNTER — Inpatient Hospital Stay: Payer: PPO | Attending: Hematology and Oncology | Admitting: Hematology and Oncology

## 2023-09-29 ENCOUNTER — Inpatient Hospital Stay: Payer: PPO

## 2023-09-29 VITALS — BP 138/55 | HR 75 | Temp 98.2°F | Resp 16 | Wt 123.2 lb

## 2023-09-29 DIAGNOSIS — Z79890 Hormone replacement therapy (postmenopausal): Secondary | ICD-10-CM | POA: Diagnosis not present

## 2023-09-29 DIAGNOSIS — J4489 Other specified chronic obstructive pulmonary disease: Secondary | ICD-10-CM | POA: Insufficient documentation

## 2023-09-29 DIAGNOSIS — Z79899 Other long term (current) drug therapy: Secondary | ICD-10-CM | POA: Insufficient documentation

## 2023-09-29 DIAGNOSIS — E785 Hyperlipidemia, unspecified: Secondary | ICD-10-CM | POA: Insufficient documentation

## 2023-09-29 DIAGNOSIS — D75839 Thrombocytosis, unspecified: Secondary | ICD-10-CM | POA: Insufficient documentation

## 2023-09-29 DIAGNOSIS — R32 Unspecified urinary incontinence: Secondary | ICD-10-CM | POA: Diagnosis not present

## 2023-09-29 DIAGNOSIS — M1990 Unspecified osteoarthritis, unspecified site: Secondary | ICD-10-CM | POA: Insufficient documentation

## 2023-09-29 LAB — CMP (CANCER CENTER ONLY)
ALT: 16 U/L (ref 0–44)
AST: 25 U/L (ref 15–41)
Albumin: 4.1 g/dL (ref 3.5–5.0)
Alkaline Phosphatase: 53 U/L (ref 38–126)
Anion gap: 6 (ref 5–15)
BUN: 20 mg/dL (ref 8–23)
CO2: 27 mmol/L (ref 22–32)
Calcium: 9.2 mg/dL (ref 8.9–10.3)
Chloride: 105 mmol/L (ref 98–111)
Creatinine: 0.62 mg/dL (ref 0.44–1.00)
GFR, Estimated: >60 mL/min (ref >60)
Glucose, Bld: 99 mg/dL (ref 70–99)
Potassium: 3.8 mmol/L (ref 3.5–5.1)
Sodium: 138 mmol/L (ref 135–145)
Total Bilirubin: 0.3 mg/dL (ref <1.2)
Total Protein: 6.5 g/dL (ref 6.5–8.1)

## 2023-09-29 LAB — CBC WITH DIFFERENTIAL/PLATELET
Abs Immature Granulocytes: 0.1 10*3/uL — ABNORMAL HIGH (ref 0.00–0.07)
Basophils Absolute: 0.1 10*3/uL (ref 0.0–0.1)
Basophils Relative: 1 %
Eosinophils Absolute: 0.2 10*3/uL (ref 0.0–0.5)
Eosinophils Relative: 2 %
HCT: 35.9 % — ABNORMAL LOW (ref 36.0–46.0)
Hemoglobin: 11.5 g/dL — ABNORMAL LOW (ref 12.0–15.0)
Immature Granulocytes: 1 %
Lymphocytes Relative: 12 %
Lymphs Abs: 1.3 10*3/uL (ref 0.7–4.0)
MCH: 28.8 pg (ref 26.0–34.0)
MCHC: 32 g/dL (ref 30.0–36.0)
MCV: 89.8 fL (ref 80.0–100.0)
Monocytes Absolute: 1 10*3/uL (ref 0.1–1.0)
Monocytes Relative: 9 %
Neutro Abs: 8.1 10*3/uL — ABNORMAL HIGH (ref 1.7–7.7)
Neutrophils Relative %: 75 %
Platelets: 360 10*3/uL (ref 150–400)
RBC: 4 MIL/uL (ref 3.87–5.11)
RDW: 13.6 % (ref 11.5–15.5)
WBC: 10.7 10*3/uL — ABNORMAL HIGH (ref 4.0–10.5)
nRBC: 0 % (ref 0.0–0.2)

## 2023-09-29 LAB — IRON AND IRON BINDING CAPACITY (CC-WL,HP ONLY)
Iron: 29 ug/dL (ref 28–170)
Saturation Ratios: 6 % — ABNORMAL LOW (ref 10.4–31.8)
TIBC: 484 ug/dL — ABNORMAL HIGH (ref 250–450)
UIBC: 455 ug/dL — ABNORMAL HIGH (ref 148–442)

## 2023-09-29 LAB — SEDIMENTATION RATE: Sed Rate: 10 mm/h (ref 0–22)

## 2023-09-29 NOTE — Progress Notes (Signed)
[facility] CONSULT NOTE  Patient Care Team: Olive Bass, MD as PCP - General (Unknown Physician Specialty) Runell Gess, MD as PCP - Cardiology (Cardiology)  CHIEF COMPLAINTS/PURPOSE OF CONSULTATION:  Thrombocytosis.  ASSESSMENT & PLAN:   This is a very pleasant 77 year old female patient with persistent thrombocytosis and some metamyelocytes on peripheral blood smear referred to hematology for additional recommendations.  Thrombocytosis Elevated platelet count for at least six months. No symptoms suggestive of a myeloproliferative disorder. Possible secondary thrombocytosis due to chronic inflammation from a wound infection. -Order genetic testing to rule out primary thrombocytosis. -Review past blood work for trend in platelet count.  Chronic Obstructive Pulmonary Disease (COPD) Stable with use of inhaler. Recent bronchitis. -Continue current inhaler therapy.  Osteoarthritis Chronic condition, no acute exacerbation. -Continue current management.  Urinary Incontinence Managed with oxybutynin 5mg  daily. -Continue oxybutynin.  Hyperlipidemia Managed with Repatha every 14 days. -Continue Repatha.  Follow-up in 3 weeks with lab results.  Orders Placed This Encounter  Procedures   CBC with Differential/Platelet    Standing Status:   Standing    Number of Occurrences:   22    Standing Expiration Date:   09/28/2024   Iron and Iron Binding Capacity ([facility] only)    Standing Status:   Future    Number of Occurrences:   1    Standing Expiration Date:   09/28/2024   Ferritin    Standing Status:   Future    Number of Occurrences:   1    Standing Expiration Date:   09/28/2024   JAK2 (INCLUDING V617F AND EXON 12), MPL,& CALR W/RFL MPN PANEL (NGS)   CMP (Cancer Center only)    Standing Status:   Future    Number of Occurrences:   1    Standing Expiration Date:   09/28/2024   Sedimentation rate    Standing Status:   Future    Number of Occurrences:    1    Standing Expiration Date:   09/28/2024   BCR-ABL    With RT-PCR technique    Standing Status:   Standing    Number of Occurrences:   22    Standing Expiration Date:   09/28/2024     HISTORY OF PRESENTING ILLNESS:  [name] 77 y.o. female is here because of thrombocytosis,  The patient, with a history of arthritis, COPD, and a recent wound infection, was referred to the hematologist-oncologist due to a high platelet count detected in a blood test. The patient reports no symptoms related to the high platelet count and feels the same as usual. The patient denies any fevers, night sweats, and weight loss. The patient's appetite is normal. The patient does not have a history of iron deficiency anemia or blood clots. The patient does not experience itching after a warm shower or any neuropathic pain. The patient has arthritis and COPD, which is managed with a spray. The patient recently recovered from bronchitis. The patient also reports a history of slow wound healing and had to visit a wound center due to a dog scratch that led to an infection. The wound has since healed. The patient is on thyroid medication and takes Repatha for cholesterol and oxybutynin for urinary incontinence.  Rest of the pertinent 10 point ROS reviewed and negative  MEDICAL HISTORY:  Past Medical History:  Diagnosis Date   Arthritis    Back pain    Cancer (HCC)    Skin   COPD (chronic obstructive  pulmonary disease) (HCC)    Dyspnea    Dysrhythmia    bradycardia   Ex-smoker    Smoked 1 PPD x 20 years, quit 1994   GERD (gastroesophageal reflux disease)    Hip pain    Hyperlipidemia    statin intolerant   Hyperthyroidism    Hypotension    Lumbar post-laminectomy syndrome    Lumbar stenosis with neurogenic claudication    LV dysfunction    ejection fraction 40-45% by 2-D echocardiogram   Palpitations    PVCs on Holter monitoring   Restless leg syndrome    Syncope    Wears dentures     SURGICAL  HISTORY: Past Surgical History:  Procedure Laterality Date   BACK SURGERY     BLADDER SUSPENSION     ESOPHAGEAL DILATION     X 2   RIGHT ROTATOR CUFF REPAIR     SPINAL CORD STIMULATOR INSERTION N/A 08/26/2019   Procedure: LUMBAR SPINAL CORD STIMULATOR INSERTION;  Surgeon: Odette Fraction, MD;  Location: [facility] OR;  Service: Neurosurgery;  Laterality: N/A;  LUMBAR SPINAL CORD STIMULATOR INSERTION    SOCIAL HISTORY: Social History   Socioeconomic History   Marital status: Married    Spouse name: [name]   Number of children: 2   Years of education: 13   Highest education level: Not on file  Occupational History   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  Vaping Use   Vaping status: Never Used  Substance and Sexual Activity   Alcohol use: Not Currently   Drug use: No   Sexual activity: Never  Other Topics Concern   Not on file  Social History Narrative   Patient lives with her husband. ([name]) - [place]   She is an adopted kid and so is unaware of her family history.   She has 2 children- one has asthma and other has arthritis.               Social Determinants of Health   Financial Resource Strain: Not on file  Food Insecurity: Low Risk  (08/24/2023)   Received from [facility]   Hunger Vital Sign    Worried About Running Out of Food in the Last Year: Never true    Ran Out of Food in the Last Year: Never true  Transportation Needs: No Transportation Needs (08/24/2023)   Received from [facility]    In the past 12 months, has lack of reliable transportation kept you from medical appointments, meetings, work or from getting things needed for daily living? : No  Physical Activity: Not on  file  Stress: Not on file  Social Connections: Not on file  Intimate Partner Violence: Not on file    FAMILY HISTORY: Family History  Adopted: Yes  Family history unknown: Yes    ALLERGIES:  is allergic to adhesive [tape], amoxicillin, ciprofloxacin, statins, sulfa antibiotics, and sulfamethoxazole.  MEDICATIONS:  Current Outpatient Medications  Medication Sig Dispense Refill   Evolocumab (REPATHA SURECLICK) 140 MG/ML SOAJ Inject 140 mg into the skin every 14 (fourteen) days.     oxybutynin (DITROPAN-XL) 5 MG 24 hr tablet Take  5 mg by mouth at bedtime.     acetaminophen (TYLENOL) 500 MG tablet Take 500 mg by mouth 2 (two) times daily as needed for moderate pain or headache.     Alpha-Lipoic Acid 100 MG CAPS Take 100 mg by mouth daily.      azelastine (ASTELIN) 137 MCG/SPRAY nasal spray Place 1 spray into both nostrils 2 (two) times daily.      carvedilol (COREG) 6.25 MG tablet Take 1 tablet (6.25 mg total) by mouth 2 (two) times daily. 180 tablet 3   cholecalciferol (VITAMIN D3) 25 MCG (1000 UT) tablet Take 1,000 Units by mouth daily.     fluticasone furoate-vilanterol (BREO ELLIPTA) 100-25 MCG/INH AEPB Inhale 1 puff into the lungs daily.     folic acid (FOLVITE) 800 MCG tablet Take 800 mcg by mouth daily.     hydroxychloroquine (PLAQUENIL) 200 MG tablet Take 200 mg by mouth daily.     Krill Oil 500 MG CAPS Take 500 mg by mouth daily.     levothyroxine (SYNTHROID, LEVOTHROID) 50 MCG tablet Take 50 mcg by mouth every evening.      LYSINE PO Take 1,000 mg by mouth daily.     Melatonin 5 MG TABS Take 5 mg by mouth at bedtime.      naproxen (NAPROSYN) 500 MG tablet Take 500 mg by mouth 2 (two) times daily with a meal.     omeprazole (PRILOSEC) 40 MG capsule Take 40 mg by mouth every evening.     rOPINIRole (REQUIP) 2 MG tablet Take 2 mg by mouth 3 (three) times daily.      temazepam (RESTORIL) 30 MG capsule Take 30 mg by mouth at bedtime.     No current facility-administered  medications for this visit.     PHYSICAL EXAMINATION: ECOG PERFORMANCE STATUS: 0 - Asymptomatic  Vitals:   09/29/23 1441  BP: (!) 138/55  Pulse: 75  Resp: 16  Temp: 98.2 F (36.8 C)  SpO2: 98%   Filed Weights   09/29/23 1441  Weight: 123 lb 3.2 oz (55.9 kg)    GENERAL:alert, no distress and comfortable SKIN: skin color, texture, turgor are normal, no rashes or significant lesions EYES: normal, conjunctiva are pink and non-injected, sclera clear OROPHARYNX:no exudate, no erythema and lips, buccal mucosa, and tongue normal  NECK: supple, thyroid normal size, non-tender, without nodularity LYMPH:  no palpable lymphadenopathy in the cervical, axillary or inguinal LUNGS: clear to auscultation and percussion with normal breathing effort HEART: regular rate & rhythm and no murmurs and no lower extremity edema ABDOMEN:abdomen soft, non-tender and normal bowel sounds Musculoskeletal:no cyanosis of digits and no clubbing  PSYCH: alert & oriented x 3 with fluent speech NEURO: no focal motor/sensory deficits  LABORATORY DATA:  I have reviewed the data as listed Lab Results  Component Value Date   WBC 5.3 08/26/2019   HGB 12.7 08/26/2019   HCT 37.9 08/26/2019   MCV 93.3 08/26/2019   PLT 291 08/26/2019     Chemistry      Component Value Date/Time   NA 134 (L) 08/26/2019 0613   K 3.7 08/26/2019 0613   CL 103 08/26/2019 0613   CO2 22 08/26/2019 0613   BUN 11 08/26/2019 0613   CREATININE 0.53 08/26/2019 0613      Component Value Date/Time   CALCIUM 9.0 08/26/2019 0613   ALKPHOS 68 06/23/2013 0450   AST 39 (H) 06/23/2013 0450   ALT 31 06/23/2013 0450   BILITOT 0.2 (L) 06/23/2013 0450  RADIOGRAPHIC STUDIES: I have personally reviewed the radiological images as listed and agreed with the findings in the report. No results found.  All questions were answered. The patient knows to call the clinic with any problems, questions or concerns. I spent 30 minutes in the  care of this patient including H and P, review of records, counseling and coordination of care.     Rachel Moulds, MD 09/29/2023 4:05 PM

## 2023-09-30 ENCOUNTER — Telehealth: Payer: Self-pay | Admitting: Hematology and Oncology

## 2023-09-30 LAB — FERRITIN: Ferritin: 13 ng/mL (ref 11–307)

## 2023-09-30 NOTE — Telephone Encounter (Signed)
 Left patient a vm regarding upcoming appointment

## 2023-10-04 LAB — BCR-ABL1, CML/ALL, PCR, QUANT
E1A2 Transcript: <0.0032 %
Interpretation (BCRAL):: NEGATIVE
b2a2 transcript: <0.0032 %
b3a2 transcript: <0.0032 %

## 2023-10-06 LAB — MISC LABCORP TEST (SEND OUT): Labcorp test code: 489514

## 2023-10-09 ENCOUNTER — Telehealth: Payer: Self-pay | Admitting: *Deleted

## 2023-10-09 NOTE — Telephone Encounter (Addendum)
-----   Message from Sidon Iruku sent at 09/30/2023  5:13 PM EST ----- No thrombocytosis, mild Iron def anemia, can she start taking iron supplement OTC 65 mg every other day.  Patient called by this RN- labs reviewed with MD recommendation for oral iron. Pt verbalized understanding. Discussed need to recheck lab prior to phone visit on 10/30/2023. Pt inquired about being charged a "facility fee" if she has labs at this office. And inquired if she would be charged for it for lab only. Above discussed with pt stating she sees Dr Sol Passer for primary care in [place] and asked about getting lab done there. This RN called above and was informed due to being an [facility] facility they too may have a facility fee as well they can only do labs needed thru an [facility] facility. This RN sent a note to this office's manager per above inquiry regarding facility fee to see how to resource for pt.  This RN was informed by management in this office - that there is no facility fee if pt has labs only or a tele visit. The facility fee is based on in office MD visit.  This RN called pt and obtained identified VM- detailed message left per above.

## 2023-10-12 ENCOUNTER — Encounter (HOSPITAL_BASED_OUTPATIENT_CLINIC_OR_DEPARTMENT_OTHER): Payer: PPO | Admitting: General Surgery

## 2023-10-27 ENCOUNTER — Other Ambulatory Visit: Payer: Self-pay | Admitting: *Deleted

## 2023-10-27 ENCOUNTER — Inpatient Hospital Stay: Payer: PPO

## 2023-10-27 DIAGNOSIS — D75839 Thrombocytosis, unspecified: Secondary | ICD-10-CM

## 2023-10-27 LAB — CBC WITH DIFFERENTIAL/PLATELET
Abs Immature Granulocytes: 0.1 10*3/uL — ABNORMAL HIGH (ref 0.00–0.07)
Basophils Absolute: 0.1 10*3/uL (ref 0.0–0.1)
Basophils Relative: 1 %
Eosinophils Absolute: 0.2 10*3/uL (ref 0.0–0.5)
Eosinophils Relative: 2 %
HCT: 37 % (ref 36.0–46.0)
Hemoglobin: 11.8 g/dL — ABNORMAL LOW (ref 12.0–15.0)
Immature Granulocytes: 1 %
Lymphocytes Relative: 17 %
Lymphs Abs: 1.5 10*3/uL (ref 0.7–4.0)
MCH: 28.1 pg (ref 26.0–34.0)
MCHC: 31.9 g/dL (ref 30.0–36.0)
MCV: 88.1 fL (ref 80.0–100.0)
Monocytes Absolute: 0.9 10*3/uL (ref 0.1–1.0)
Monocytes Relative: 10 %
Neutro Abs: 6.2 10*3/uL (ref 1.7–7.7)
Neutrophils Relative %: 69 %
Platelets: 359 10*3/uL (ref 150–400)
RBC: 4.2 MIL/uL (ref 3.87–5.11)
RDW: 14.7 % (ref 11.5–15.5)
WBC: 9 10*3/uL (ref 4.0–10.5)
nRBC: 0 % (ref 0.0–0.2)

## 2023-10-30 ENCOUNTER — Inpatient Hospital Stay: Payer: PPO | Attending: Hematology and Oncology | Admitting: Hematology and Oncology

## 2023-10-30 DIAGNOSIS — D75839 Thrombocytosis, unspecified: Secondary | ICD-10-CM | POA: Diagnosis not present

## 2023-10-30 NOTE — Progress Notes (Addendum)
 [facility] CONSULT NOTE  Patient Care Team: Ofilia Lamar CROME, MD as PCP - General (Unknown Physician Specialty) Court Dorn PARAS, MD as PCP - Cardiology (Cardiology)  CHIEF COMPLAINTS/PURPOSE OF CONSULTATION:  Thrombocytosis.  ASSESSMENT & PLAN:   This is a very pleasant 78 year old female patient with persistent thrombocytosis and some metamyelocytes on peripheral blood smear referred to hematology for additional recommendations.  Thrombocytosis, most recent CBC with no evidence of thrombocytosis.  JAK2 panel, other myeloproliferative panel is negative.  No evidence of BCR-ABL mutation.  At this time I wonder if this is likely secondary thrombocytosis from her chronic wound.  Will plan to repeat CBC in 6 months and follow-up with a telephone visit, patient wants to avoid facility charges and large healthcare bills if possible.  With regards to iron  deficiency, her ferritin is 13 however she is unable to tolerate oral iron  supplementation.  I have offered intravenous iron  but she refused.  She wants to try and continue food rich in iron  and see if she can improve her blood counts with this.  Will repeat CBC, iron  panel and ferritin when she returns for a lab visit in 6 months.  No orders of the defined types were placed in this encounter.    HISTORY OF PRESENTING ILLNESS:  [name] 78 y.o. female is here because of thrombocytosis,  The patient, with a history of arthritis, COPD, and a recent wound infection, was referred to the hematologist-oncologist due to a high platelet count detected in a blood test.  She is here for telephone follow-up to review her lab results.  She and her husband were both present on the phone. She did not have any new complaints to review except that she tried the oral iron  and she could not stand it, she had too many side effects.  Hence she and her husband want to change her diet so she can consume more iron  rich foods and they are reluctant  to consider intravenous iron .  MEDICAL HISTORY:  Past Medical History:  Diagnosis Date   Arthritis    Back pain    Cancer (HCC)    Skin   COPD (chronic obstructive pulmonary disease) (HCC)    Dyspnea    Dysrhythmia    bradycardia   Ex-smoker    Smoked 1 PPD x 20 years, quit 1994   GERD (gastroesophageal reflux disease)    Hip pain    Hyperlipidemia    statin intolerant   Hyperthyroidism    Hypotension    Lumbar post-laminectomy syndrome    Lumbar stenosis with neurogenic claudication    LV dysfunction    ejection fraction 40-45% by 2-D echocardiogram   Palpitations    PVCs on Holter monitoring   Restless leg syndrome    Syncope    Wears dentures     SURGICAL HISTORY: Past Surgical History:  Procedure Laterality Date   BACK SURGERY     BLADDER SUSPENSION     ESOPHAGEAL DILATION     X 2   RIGHT ROTATOR CUFF REPAIR     SPINAL CORD STIMULATOR INSERTION N/A 08/26/2019   Procedure: LUMBAR SPINAL CORD STIMULATOR INSERTION;  Surgeon: Mindi Mt, MD;  Location: [facility] OR;  Service: Neurosurgery;  Laterality: N/A;  LUMBAR SPINAL CORD STIMULATOR INSERTION    SOCIAL HISTORY: Social History   Socioeconomic History   Marital status: Married    Spouse name: [name]   Number of children: 2   Years of education: 13   Highest education  level: Not on file  Occupational History   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  Vaping Use   Vaping status: Never Used  Substance and Sexual Activity   Alcohol use: Not Currently   Drug use: No   Sexual activity: Never  Other Topics Concern   Not on file  Social History Narrative   Patient lives with her husband. ([name]) - [place]   She is an adopted kid and so is  unaware of her family history.   She has 2 children- one has asthma and other has arthritis.               Social Drivers of Corporate Investment Banker Strain: Not on file  Food Insecurity: Low Risk  (10/07/2023)   Received from [facility]   Hunger Vital Sign    Worried About Running Out of Food in the Last Year: Never true    Ran Out of Food in the Last Year: Never true  Transportation Needs: No Transportation Needs (10/07/2023)   Received from [facility]    In the past 12 months, has lack of reliable transportation kept you from medical appointments, meetings, work or from getting things needed for daily living? : No  Physical Activity: Not on file  Stress: Not on file  Social Connections: Not on file  Intimate Partner Violence: Not on file    FAMILY HISTORY: Family History  Adopted: Yes  Family history unknown: Yes    ALLERGIES:  is allergic to adhesive [tape], amoxicillin, ciprofloxacin, statins, sulfa antibiotics, and sulfamethoxazole.  MEDICATIONS:  Current Outpatient Medications  Medication Sig Dispense Refill   acetaminophen  (TYLENOL ) 500 MG tablet Take 500 mg by mouth 2 (two) times daily as needed for moderate pain or headache.     Alpha-Lipoic Acid 100 MG CAPS Take 100 mg by mouth daily.      azelastine  (ASTELIN ) 137 MCG/SPRAY nasal spray Place 1 spray into both nostrils 2 (two) times daily.      carvedilol  (COREG ) 6.25 MG tablet Take 1 tablet (6.25 mg total) by mouth 2 (two) times daily. 180 tablet 3   cholecalciferol  (VITAMIN D3) 25 MCG (1000 UT) tablet Take 1,000 Units by mouth daily.     Evolocumab (REPATHA SURECLICK) 140 MG/ML SOAJ Inject 140 mg into the skin every 14 (fourteen) days.     fluticasone  furoate-vilanterol (BREO ELLIPTA ) 100-25 MCG/INH AEPB Inhale 1 puff into the lungs daily.     folic acid (FOLVITE) 800 MCG tablet Take 800 mcg by mouth daily.     hydroxychloroquine (PLAQUENIL) 200 MG tablet Take 200 mg by mouth daily.      Krill Oil 500 MG CAPS Take 500 mg by mouth daily.     levothyroxine  (SYNTHROID , LEVOTHROID) 50 MCG tablet Take 50 mcg by mouth every evening.      LYSINE PO Take 1,000 mg by mouth daily.     Melatonin 5 MG TABS Take 5 mg by mouth at bedtime.      naproxen (NAPROSYN) 500 MG tablet Take 500 mg by mouth 2 (two) times daily with a meal.     omeprazole (  PRILOSEC) 40 MG capsule Take 40 mg by mouth every evening.     oxybutynin (DITROPAN-XL) 5 MG 24 hr tablet Take 5 mg by mouth at bedtime.     rOPINIRole  (REQUIP ) 2 MG tablet Take 2 mg by mouth 3 (three) times daily.      temazepam  (RESTORIL ) 30 MG capsule Take 30 mg by mouth at bedtime.     No current facility-administered medications for this visit.     PHYSICAL EXAMINATION: ECOG PERFORMANCE STATUS: 0 - Asymptomatic  There were no vitals filed for this visit.  There were no vitals filed for this visit.  Physical exam deferred, telephone visit LABORATORY DATA:  I have reviewed the data as listed Lab Results  Component Value Date   WBC 9.0 10/27/2023   HGB 11.8 (L) 10/27/2023   HCT 37.0 10/27/2023   MCV 88.1 10/27/2023   PLT 359 10/27/2023     Chemistry      Component Value Date/Time   NA 138 09/29/2023 1554   K 3.8 09/29/2023 1554   CL 105 09/29/2023 1554   CO2 27 09/29/2023 1554   BUN 20 09/29/2023 1554   CREATININE 0.62 09/29/2023 1554      Component Value Date/Time   CALCIUM  9.2 09/29/2023 1554   ALKPHOS 53 09/29/2023 1554   AST 25 09/29/2023 1554   ALT 16 09/29/2023 1554   BILITOT 0.3 09/29/2023 1554       RADIOGRAPHIC STUDIES: I have personally reviewed the radiological images as listed and agreed with the findings in the report. No results found.  All questions were answered. The patient knows to call the clinic with any problems, questions or concerns.  I connected with  Amika K Noll on 10/30/23 by a telephone application and verified that I am speaking with the correct person using two identifiers.   I  discussed the limitations of evaluation and management by telemedicine. The patient expressed understanding and agreed to proceed.  Time spent: 10 min Location of provider: office Location of patient: Home     Amber Stalls, MD 10/30/2023 1:44 PM

## 2023-11-01 LAB — BCR-ABL1, CML/ALL, PCR, QUANT
E1A2 Transcript: <0.0032 %
Interpretation (BCRAL):: NEGATIVE
b2a2 transcript: <0.0032 %
b3a2 transcript: <0.0032 %

## 2023-11-02 ENCOUNTER — Telehealth: Payer: Self-pay | Admitting: Hematology and Oncology

## 2023-11-02 NOTE — Telephone Encounter (Signed)
Spoke with patient husband confirming upcoming appointment  

## 2024-05-02 ENCOUNTER — Telehealth: Payer: Self-pay | Admitting: Hematology and Oncology

## 2024-05-02 NOTE — Telephone Encounter (Signed)
 Called to reschedule patient appointment to due provider pal  request. I talked  to patient and they are aware of the changes that was made to the upcoming appointment

## 2024-05-03 ENCOUNTER — Inpatient Hospital Stay: Payer: PPO | Attending: Hematology and Oncology

## 2024-05-03 DIAGNOSIS — E611 Iron deficiency: Secondary | ICD-10-CM | POA: Insufficient documentation

## 2024-05-03 DIAGNOSIS — D75839 Thrombocytosis, unspecified: Secondary | ICD-10-CM | POA: Insufficient documentation

## 2024-05-03 LAB — CBC WITH DIFFERENTIAL/PLATELET
Abs Immature Granulocytes: 0.11 K/uL — ABNORMAL HIGH (ref 0.00–0.07)
Basophils Absolute: 0.1 K/uL (ref 0.0–0.1)
Basophils Relative: 1 %
Eosinophils Absolute: 0.3 K/uL (ref 0.0–0.5)
Eosinophils Relative: 3 %
HCT: 31.8 % — ABNORMAL LOW (ref 36.0–46.0)
Hemoglobin: 9.8 g/dL — ABNORMAL LOW (ref 12.0–15.0)
Immature Granulocytes: 1 %
Lymphocytes Relative: 14 %
Lymphs Abs: 1.5 K/uL (ref 0.7–4.0)
MCH: 23.8 pg — ABNORMAL LOW (ref 26.0–34.0)
MCHC: 30.8 g/dL (ref 30.0–36.0)
MCV: 77.2 fL — ABNORMAL LOW (ref 80.0–100.0)
Monocytes Absolute: 1 K/uL (ref 0.1–1.0)
Monocytes Relative: 9 %
Neutro Abs: 7.7 K/uL (ref 1.7–7.7)
Neutrophils Relative %: 72 %
Platelets: 477 K/uL — ABNORMAL HIGH (ref 150–400)
RBC: 4.12 MIL/uL (ref 3.87–5.11)
RDW: 16.8 % — ABNORMAL HIGH (ref 11.5–15.5)
WBC: 10.6 K/uL — ABNORMAL HIGH (ref 4.0–10.5)
nRBC: 0 % (ref 0.0–0.2)

## 2024-05-03 LAB — IRON AND IRON BINDING CAPACITY (CC-WL,HP ONLY)
Iron: 17 ug/dL — ABNORMAL LOW (ref 28–170)
Saturation Ratios: 4 % — ABNORMAL LOW (ref 10.4–31.8)
TIBC: 428 ug/dL (ref 250–450)
UIBC: 411 ug/dL (ref 148–442)

## 2024-05-03 LAB — FERRITIN: Ferritin: 14 ng/mL (ref 11–307)

## 2024-05-11 ENCOUNTER — Telehealth: Payer: PPO | Admitting: Hematology and Oncology

## 2024-05-16 ENCOUNTER — Inpatient Hospital Stay: Admitting: Hematology and Oncology

## 2024-05-16 DIAGNOSIS — D75839 Thrombocytosis, unspecified: Secondary | ICD-10-CM | POA: Diagnosis not present

## 2024-05-16 NOTE — Progress Notes (Signed)
 [facility] CONSULT NOTE  Patient Care Team: Ofilia Lamar CROME, MD as PCP - General (Unknown Physician Specialty) Court Dorn PARAS, MD as PCP - Cardiology (Cardiology)  CHIEF COMPLAINTS/PURPOSE OF CONSULTATION:  Thrombocytosis.  ASSESSMENT & PLAN:   This is a very pleasant 78 year old female patient with persistent thrombocytosis and some metamyelocytes on peripheral blood smear referred to hematology for additional recommendations.  During her last visit, she insisted on telephone visits to facilitate these enlarged palpitations.  She had labs recently which showed hemoglobin of 9.8 g/dL, drop of 2 g from her blood work back in December 2024  Iron panel and ferritin once again suggest low iron stores.  Anemia is microcytic hypochromic and likely reactive thrombocytosis.  She once again has immature granulocytes which have not changed from the last visit in December.  I am recommending that she start oral iron supplementations and she is not keen on considering IV iron.  She should take ferrous sulfate 65 mg every other week I will plan to call her back in 3 to 4 weeks to see if she is tolerating oral iron, if she is unable to then we will bring her back to consider intravenous iron.  She is ok with this plan.  No orders of the defined types were placed in this encounter.    HISTORY OF PRESENTING ILLNESS:  [name] 78 y.o. female is here because of thrombocytosis, She is here for a telephone visit. She had blood work 2 weeks ago.  She has not been taking any oral iron supplements, refused and wanted to try dietary changes.  She had no complaints for me today.   MEDICAL HISTORY:  Past Medical History:  Diagnosis Date   Arthritis    Back pain    Cancer (HCC)    Skin   COPD (chronic obstructive pulmonary disease) (HCC)    Dyspnea    Dysrhythmia    bradycardia   Ex-smoker    Smoked 1 PPD x 20 years, quit 1994   GERD (gastroesophageal reflux disease)    Hip pain     Hyperlipidemia    statin intolerant   Hyperthyroidism    Hypotension    Lumbar post-laminectomy syndrome    Lumbar stenosis with neurogenic claudication    LV dysfunction    ejection fraction 40-45% by 2-D echocardiogram   Palpitations    PVCs on Holter monitoring   Restless leg syndrome    Syncope    Wears dentures     SURGICAL HISTORY: Past Surgical History:  Procedure Laterality Date   BACK SURGERY     BLADDER SUSPENSION     ESOPHAGEAL DILATION     X 2   RIGHT ROTATOR CUFF REPAIR     SPINAL CORD STIMULATOR INSERTION N/A 08/26/2019   Procedure: LUMBAR SPINAL CORD STIMULATOR INSERTION;  Surgeon: Mindi Mt, MD;  Location: [facility] OR;  Service: Neurosurgery;  Laterality: N/A;  LUMBAR SPINAL CORD STIMULATOR INSERTION    SOCIAL HISTORY: Social History   Socioeconomic History   Marital status: Married    Spouse name: [name]   Number of children: 2   Years of education: 13   Highest education level: Not on file  Occupational History   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  Vaping Use   Vaping status: Never Used  Substance and Sexual Activity   Alcohol use: Not Currently   Drug use: No   Sexual activity: Never  Other Topics Concern   Not on file  Social History Narrative   Patient lives with her husband. ([name]) - [place]   She is an adopted kid and so is unaware of her family history.   She has 2 children- one has asthma and other has arthritis.               Social Drivers of Corporate investment banker Strain: Not on file  Food Insecurity: Low Risk  (05/11/2024)   Received from [facility]   Hunger Vital Sign    Within the past 12 months, you worried that your food would run out before  you got money to buy more: Never true    Within the past 12 months, the food you bought just didn't last and you didn't have money to get more. : Never true  Transportation Needs: No Transportation Needs (05/11/2024)   Received from [employer]    In the past 12 months, has lack of reliable transportation kept you from medical appointments, meetings, work or from getting things needed for daily living? : No  Physical Activity: Not on file  Stress: Not on file  Social Connections: Not on file  Intimate Partner Violence: Not on file    FAMILY HISTORY: Family History  Adopted: Yes  Family history unknown: Yes    ALLERGIES:  is allergic to adhesive [tape], amoxicillin, ciprofloxacin, statins, sulfa antibiotics, and sulfamethoxazole.  MEDICATIONS:  Current Outpatient Medications  Medication Sig Dispense Refill   acetaminophen  (TYLENOL ) 500 MG tablet Take 500 mg by mouth 2 (two) times daily as needed for moderate pain or headache.     Alpha-Lipoic Acid 100 MG CAPS Take 100 mg by mouth daily.      azelastine  (ASTELIN ) 137 MCG/SPRAY nasal spray Place 1 spray into both nostrils 2 (two) times daily.      carvedilol  (COREG ) 6.25 MG tablet Take 1 tablet (6.25 mg total) by mouth 2 (two) times daily. 180 tablet 3   cholecalciferol  (VITAMIN D3) 25 MCG (1000 UT) tablet Take 1,000 Units by mouth daily.     Evolocumab (REPATHA SURECLICK) 140 MG/ML SOAJ Inject 140 mg into the skin every 14 (fourteen) days.     fluticasone  furoate-vilanterol (BREO ELLIPTA ) 100-25 MCG/INH AEPB Inhale 1 puff into the lungs daily.     folic acid (FOLVITE) 800 MCG tablet Take 800 mcg by mouth daily.     hydroxychloroquine (PLAQUENIL) 200 MG tablet Take 200 mg by mouth daily.     Krill Oil 500 MG CAPS Take 500 mg by mouth daily.     levothyroxine  (SYNTHROID , LEVOTHROID) 50 MCG tablet Take 50 mcg by mouth every evening.      LYSINE PO Take 1,000 mg by mouth daily.     Melatonin 5 MG TABS Take 5 mg by mouth  at bedtime.      naproxen (NAPROSYN) 500 MG tablet Take 500 mg by mouth 2 (two) times daily with a meal.     omeprazole (PRILOSEC) 40 MG capsule Take 40 mg by mouth every evening.     oxybutynin (DITROPAN-XL) 5 MG 24 hr tablet Take 5 mg by mouth at bedtime.  rOPINIRole  (REQUIP ) 2 MG tablet Take 2 mg by mouth 3 (three) times daily.      temazepam  (RESTORIL ) 30 MG capsule Take 30 mg by mouth at bedtime.     No current facility-administered medications for this visit.     PHYSICAL EXAMINATION: ECOG PERFORMANCE STATUS: 0 - Asymptomatic  There were no vitals filed for this visit.  There were no vitals filed for this visit.  Physical exam deferred, telephone visit  LABORATORY DATA:  I have reviewed the data as listed Lab Results  Component Value Date   WBC 10.6 (H) 05/03/2024   HGB 9.8 (L) 05/03/2024   HCT 31.8 (L) 05/03/2024   MCV 77.2 (L) 05/03/2024   PLT 477 (H) 05/03/2024     Chemistry      Component Value Date/Time   NA 138 09/29/2023 1554   K 3.8 09/29/2023 1554   CL 105 09/29/2023 1554   CO2 27 09/29/2023 1554   BUN 20 09/29/2023 1554   CREATININE 0.62 09/29/2023 1554      Component Value Date/Time   CALCIUM  9.2 09/29/2023 1554   ALKPHOS 53 09/29/2023 1554   AST 25 09/29/2023 1554   ALT 16 09/29/2023 1554   BILITOT 0.3 09/29/2023 1554       RADIOGRAPHIC STUDIES: I have personally reviewed the radiological images as listed and agreed with the findings in the report. No results found.  All questions were answered. The patient knows to call the clinic with any problems, questions or concerns.  I connected with  Amyrah K Igo on 05/16/24 by a telephone application and verified that I am speaking with the correct person using two identifiers.   I discussed the limitations of evaluation and management by telemedicine. The patient expressed understanding and agreed to proceed.  Time spent: 10 min Location of provider: office Location of patient: Home      Amber Stalls, MD 05/16/2024 4:11 PM

## 2024-05-17 ENCOUNTER — Telehealth: Payer: Self-pay | Admitting: Hematology and Oncology

## 2024-05-17 NOTE — Telephone Encounter (Signed)
Spoke with patient husband confirming upcoming appointment  

## 2024-05-30 ENCOUNTER — Other Ambulatory Visit: Payer: Self-pay | Admitting: *Deleted

## 2024-06-07 ENCOUNTER — Telehealth: Admitting: Hematology and Oncology

## 2024-06-13 ENCOUNTER — Other Ambulatory Visit: Payer: Self-pay

## 2024-06-13 ENCOUNTER — Inpatient Hospital Stay: Attending: Hematology and Oncology

## 2024-06-13 DIAGNOSIS — D75839 Thrombocytosis, unspecified: Secondary | ICD-10-CM | POA: Diagnosis present

## 2024-06-13 DIAGNOSIS — Z79899 Other long term (current) drug therapy: Secondary | ICD-10-CM | POA: Insufficient documentation

## 2024-06-13 DIAGNOSIS — D649 Anemia, unspecified: Secondary | ICD-10-CM | POA: Insufficient documentation

## 2024-06-13 DIAGNOSIS — Z85828 Personal history of other malignant neoplasm of skin: Secondary | ICD-10-CM | POA: Diagnosis not present

## 2024-06-13 DIAGNOSIS — D509 Iron deficiency anemia, unspecified: Secondary | ICD-10-CM | POA: Diagnosis not present

## 2024-06-13 LAB — CBC WITH DIFFERENTIAL/PLATELET
Abs Immature Granulocytes: 0.1 K/uL — ABNORMAL HIGH (ref 0.00–0.07)
Basophils Absolute: 0.1 K/uL (ref 0.0–0.1)
Basophils Relative: 1 %
Eosinophils Absolute: 0.2 K/uL (ref 0.0–0.5)
Eosinophils Relative: 2 %
HCT: 31.2 % — ABNORMAL LOW (ref 36.0–46.0)
Hemoglobin: 9.5 g/dL — ABNORMAL LOW (ref 12.0–15.0)
Immature Granulocytes: 1 %
Lymphocytes Relative: 12 %
Lymphs Abs: 1.3 K/uL (ref 0.7–4.0)
MCH: 24.2 pg — ABNORMAL LOW (ref 26.0–34.0)
MCHC: 30.4 g/dL (ref 30.0–36.0)
MCV: 79.6 fL — ABNORMAL LOW (ref 80.0–100.0)
Monocytes Absolute: 0.9 K/uL (ref 0.1–1.0)
Monocytes Relative: 9 %
Neutro Abs: 7.8 K/uL — ABNORMAL HIGH (ref 1.7–7.7)
Neutrophils Relative %: 75 %
Platelets: 377 K/uL (ref 150–400)
RBC: 3.92 MIL/uL (ref 3.87–5.11)
RDW: 20.4 % — ABNORMAL HIGH (ref 11.5–15.5)
WBC: 10.3 K/uL (ref 4.0–10.5)
nRBC: 0 % (ref 0.0–0.2)

## 2024-06-14 ENCOUNTER — Other Ambulatory Visit: Payer: Self-pay | Admitting: Hematology and Oncology

## 2024-06-15 ENCOUNTER — Inpatient Hospital Stay (HOSPITAL_BASED_OUTPATIENT_CLINIC_OR_DEPARTMENT_OTHER): Admitting: Hematology and Oncology

## 2024-06-15 DIAGNOSIS — D509 Iron deficiency anemia, unspecified: Secondary | ICD-10-CM

## 2024-06-15 DIAGNOSIS — D75839 Thrombocytosis, unspecified: Secondary | ICD-10-CM | POA: Diagnosis not present

## 2024-06-15 NOTE — Progress Notes (Signed)
 [facility] CONSULT NOTE  Patient Care Team: Ofilia Lamar CROME, MD as PCP - General (Unknown Physician Specialty) Court Dorn PARAS, MD as PCP - Cardiology (Cardiology)  CHIEF COMPLAINTS/PURPOSE OF CONSULTATION:  Thrombocytosis.  ASSESSMENT & PLAN:   This is a very pleasant 78 year old female patient with persistent thrombocytosis and some metamyelocytes on peripheral blood smear referred to hematology for additional recommendations. She didn't want to take oral iron or IV iron when we last spoke with her. CBC from 8/18 showed worsening anemia, thrombocytosis resolved I encouraged her to consider taking low dose oral iron a couple times a week at least and if she tolerates it, she can take it every other day. FU in 8 weeks with labs or sooner PRN.  HISTORY OF PRESENTING ILLNESS:  Elaine GUINTHER 77 y.o. female is here because of thrombocytosis, She is here for a telephone visit.   Patient did not want to consider oral iron supplementation or IV iron supplementation, she wanted to try diet which is rich in iron.  She had a lab yesterday and wanted to follow-up on it. She tells me that she has found cereal which is very rich in iron and is consuming them daily.  Besides this, she is also focusing on foods which are rich in iron.   MEDICAL HISTORY:  Past Medical History:  Diagnosis Date   Arthritis    Back pain    Cancer (HCC)    Skin   COPD (chronic obstructive pulmonary disease) (HCC)    Dyspnea    Dysrhythmia    bradycardia   Ex-smoker    Smoked 1 PPD x 20 years, quit 1994   GERD (gastroesophageal reflux disease)    Hip pain    Hyperlipidemia    statin intolerant   Hyperthyroidism    Hypotension    Lumbar post-laminectomy syndrome    Lumbar stenosis with neurogenic claudication    LV dysfunction    ejection fraction 40-45% by 2-D echocardiogram   Palpitations    PVCs on Holter monitoring   Restless leg syndrome    Syncope    Wears dentures     SURGICAL  HISTORY: Past Surgical History:  Procedure Laterality Date   BACK SURGERY     BLADDER SUSPENSION     ESOPHAGEAL DILATION     X 2   RIGHT ROTATOR CUFF REPAIR     SPINAL CORD STIMULATOR INSERTION N/A 08/26/2019   Procedure: LUMBAR SPINAL CORD STIMULATOR INSERTION;  Surgeon: Mindi Mt, MD;  Location: [facility] OR;  Service: Neurosurgery;  Laterality: N/A;  LUMBAR SPINAL CORD STIMULATOR INSERTION    SOCIAL HISTORY: Social History   Socioeconomic History   Marital status: Married    Spouse name: [name]   Number of children: 2   Years of education: 13   Highest education level: Not on file  Occupational History   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  Vaping Use   Vaping status: Never Used  Substance and Sexual Activity   Alcohol use: Not Currently   Drug use: No   Sexual activity: Never  Other Topics Concern   Not on file  Social  History Narrative   Patient lives with her husband. ([name]) - [place]   She is an adopted kid and so is unaware of her family history.   She has 2 children- one has asthma and other has arthritis.               Social Drivers of Corporate investment banker Strain: Not on file  Food Insecurity: Low Risk  (06/14/2024)   Received from [facility]   Hunger Vital Sign    Within the past 12 months, you worried that your food would run out before you got money to buy more: Never true    Within the past 12 months, the food you bought just didn't last and you didn't have money to get more. : Never true  Transportation Needs: No Transportation Needs (06/14/2024)   Received from [employer]    In the past 12 months, has lack of reliable transportation kept you from  medical appointments, meetings, work or from getting things needed for daily living? : No  Physical Activity: Not on file  Stress: Not on file  Social Connections: Not on file  Intimate Partner Violence: Not on file    FAMILY HISTORY: Family History  Adopted: Yes  Family history unknown: Yes    ALLERGIES:  is allergic to adhesive [tape], amoxicillin, ciprofloxacin, statins, sulfa antibiotics, and sulfamethoxazole.  MEDICATIONS:  Current Outpatient Medications  Medication Sig Dispense Refill   acetaminophen  (TYLENOL ) 500 MG tablet Take 500 mg by mouth 2 (two) times daily as needed for moderate pain or headache.     Alpha-Lipoic Acid 100 MG CAPS Take 100 mg by mouth daily.      azelastine  (ASTELIN ) 137 MCG/SPRAY nasal spray Place 1 spray into both nostrils 2 (two) times daily.      carvedilol  (COREG ) 6.25 MG tablet Take 1 tablet (6.25 mg total) by mouth 2 (two) times daily. 180 tablet 3   cholecalciferol  (VITAMIN D3) 25 MCG (1000 UT) tablet Take 1,000 Units by mouth daily.     Evolocumab (REPATHA SURECLICK) 140 MG/ML SOAJ Inject 140 mg into the skin every 14 (fourteen) days.     fluticasone  furoate-vilanterol (BREO ELLIPTA ) 100-25 MCG/INH AEPB Inhale 1 puff into the lungs daily.     folic acid (FOLVITE) 800 MCG tablet Take 800 mcg by mouth daily.     hydroxychloroquine (PLAQUENIL) 200 MG tablet Take 200 mg by mouth daily.     Krill Oil 500 MG CAPS Take 500 mg by mouth daily.     levothyroxine  (SYNTHROID , LEVOTHROID) 50 MCG tablet Take 50 mcg by mouth every evening.      LYSINE PO Take 1,000 mg by mouth daily.     Melatonin 5 MG TABS Take 5 mg by mouth at bedtime.      naproxen (NAPROSYN) 500 MG tablet Take 500 mg by mouth 2 (two) times daily with a meal.     omeprazole (PRILOSEC) 40 MG capsule Take 40 mg by mouth every evening.     oxybutynin (DITROPAN-XL) 5 MG 24 hr tablet Take 5 mg by mouth at bedtime.     rOPINIRole  (REQUIP ) 2 MG tablet Take 2 mg by mouth 3 (three) times daily.       temazepam  (RESTORIL ) 30 MG capsule Take 30 mg by mouth at bedtime.     No current facility-administered medications for this visit.     PHYSICAL EXAMINATION: ECOG PERFORMANCE STATUS: 0 - Asymptomatic  There were no vitals filed for  this visit.  There were no vitals filed for this visit.  Physical exam deferred, telephone visit  LABORATORY DATA:  I have reviewed the data as listed Lab Results  Component Value Date   WBC 10.3 06/13/2024   HGB 9.5 (L) 06/13/2024   HCT 31.2 (L) 06/13/2024   MCV 79.6 (L) 06/13/2024   PLT 377 06/13/2024     Chemistry      Component Value Date/Time   NA 138 09/29/2023 1554   K 3.8 09/29/2023 1554   CL 105 09/29/2023 1554   CO2 27 09/29/2023 1554   BUN 20 09/29/2023 1554   CREATININE 0.62 09/29/2023 1554      Component Value Date/Time   CALCIUM  9.2 09/29/2023 1554   ALKPHOS 53 09/29/2023 1554   AST 25 09/29/2023 1554   ALT 16 09/29/2023 1554   BILITOT 0.3 09/29/2023 1554       RADIOGRAPHIC STUDIES: I have personally reviewed the radiological images as listed and agreed with the findings in the report. No results found.  All questions were answered. The patient knows to call the clinic with any problems, questions or concerns.  I connected with  Veralyn K Inboden on 06/15/24 by a telephone application and verified that I am speaking with the correct person using two identifiers.   I discussed the limitations of evaluation and management by telemedicine. The patient expressed understanding and agreed to proceed.  Time spent: 10 min Location of provider: office Location of patient: Home     Amber Stalls, MD 06/15/2024 12:08 PM

## 2024-06-16 LAB — BCR-ABL1, CML/ALL, PCR, QUANT
E1A2 Transcript: <0.0032 %
Interpretation (BCRAL):: NEGATIVE
b2a2 transcript: <0.0032 %
b3a2 transcript: <0.0032 %

## 2024-08-09 ENCOUNTER — Inpatient Hospital Stay: Attending: Hematology and Oncology

## 2024-08-09 DIAGNOSIS — D75839 Thrombocytosis, unspecified: Secondary | ICD-10-CM | POA: Insufficient documentation

## 2024-08-09 DIAGNOSIS — D509 Iron deficiency anemia, unspecified: Secondary | ICD-10-CM

## 2024-08-09 LAB — CBC WITH DIFFERENTIAL/PLATELET
Abs Immature Granulocytes: 0.09 K/uL — ABNORMAL HIGH (ref 0.00–0.07)
Basophils Absolute: 0.1 K/uL (ref 0.0–0.1)
Basophils Relative: 1 %
Eosinophils Absolute: 0.2 K/uL (ref 0.0–0.5)
Eosinophils Relative: 2 %
HCT: 30.6 % — ABNORMAL LOW (ref 36.0–46.0)
Hemoglobin: 9.2 g/dL — ABNORMAL LOW (ref 12.0–15.0)
Immature Granulocytes: 1 %
Lymphocytes Relative: 12 %
Lymphs Abs: 1.5 K/uL (ref 0.7–4.0)
MCH: 23.8 pg — ABNORMAL LOW (ref 26.0–34.0)
MCHC: 30.1 g/dL (ref 30.0–36.0)
MCV: 79.3 fL — ABNORMAL LOW (ref 80.0–100.0)
Monocytes Absolute: 0.9 K/uL (ref 0.1–1.0)
Monocytes Relative: 7 %
Neutro Abs: 9.7 K/uL — ABNORMAL HIGH (ref 1.7–7.7)
Neutrophils Relative %: 77 %
Platelets: 412 K/uL — ABNORMAL HIGH (ref 150–400)
RBC: 3.86 MIL/uL — ABNORMAL LOW (ref 3.87–5.11)
RDW: 22.7 % — ABNORMAL HIGH (ref 11.5–15.5)
WBC: 12.6 K/uL — ABNORMAL HIGH (ref 4.0–10.5)
nRBC: 0 % (ref 0.0–0.2)

## 2024-08-09 LAB — IRON AND IRON BINDING CAPACITY (CC-WL,HP ONLY)
Iron: 17 ug/dL — ABNORMAL LOW (ref 28–170)
Saturation Ratios: 4 % — ABNORMAL LOW (ref 10.4–31.8)
TIBC: 398 ug/dL (ref 250–450)
UIBC: 381 ug/dL (ref 148–442)

## 2024-08-09 LAB — FERRITIN: Ferritin: 21 ng/mL (ref 11–307)

## 2024-08-11 ENCOUNTER — Inpatient Hospital Stay: Admitting: Hematology and Oncology

## 2024-08-11 DIAGNOSIS — D509 Iron deficiency anemia, unspecified: Secondary | ICD-10-CM | POA: Diagnosis not present

## 2024-08-11 NOTE — Progress Notes (Signed)
 [name] CONSULT NOTE  Patient Care Team: Ofilia Lamar CROME, MD as PCP - General (Unknown Physician Specialty) Court Dorn PARAS, MD as PCP - Cardiology (Cardiology)  CHIEF COMPLAINTS/PURPOSE OF CONSULTATION:  Thrombocytosis.  ASSESSMENT & PLAN:   This is a very pleasant 78 year old female patient with persistent thrombocytosis and some metamyelocytes on peripheral blood smear referred to hematology for additional recommendations. She has been very hesitant to IV iron and requests telephone visits. Since her last visit, she is now taking ferrous sulfate 65 mg daily Labs done recently with Hb lower than previous visit at 9.2 and mildly improved ferritin. We discussed about continuing oral iron vs IV iron. They are interested in pursuing IV iron if this is covered by their insurance We discussed about placing venofer 300 mg weekly plan for 3 weeks. When the scheduler calls them to schedule, they can verify insurance authorization and I also encouraged them to call their insurance for more information We also discussed about the possibility of infusion reaction including but not limited to anaphylaxis. They are agreeable to this plan Can consider MPN testing at her next visit, mild thrombocytosis continues.  I recommended an inperson visit in 3 months with labs.   HISTORY OF PRESENTING ILLNESS:  [name] 78 y.o. female is here because of thrombocytosis and follow up for IDA  She is here for a telephone visit. She denies any new health complaints today. She says she has been taking ferrous sulfate 65 mg daily. Husband was on the phone as well, he inquired into insurance coverage for IV iron and if that would be a better way to deal with the IDA. She has noted some gastric upset with iron.  Rest of the pertinent 10 point ROS reviewed and neg.  MEDICAL HISTORY:  Past Medical History:  Diagnosis Date   Arthritis    Back pain    Cancer (HCC)    Skin   COPD (chronic  obstructive pulmonary disease) (HCC)    Dyspnea    Dysrhythmia    bradycardia   Ex-smoker    Smoked 1 PPD x 20 years, quit 1994   GERD (gastroesophageal reflux disease)    Hip pain    Hyperlipidemia    statin intolerant   Hyperthyroidism    Hypotension    Lumbar post-laminectomy syndrome    Lumbar stenosis with neurogenic claudication    LV dysfunction    ejection fraction 40-45% by 2-D echocardiogram   Palpitations    PVCs on Holter monitoring   Restless leg syndrome    Syncope    Wears dentures     SURGICAL HISTORY: Past Surgical History:  Procedure Laterality Date   BACK SURGERY     BLADDER SUSPENSION     ESOPHAGEAL DILATION     X 2   RIGHT ROTATOR CUFF REPAIR     SPINAL CORD STIMULATOR INSERTION N/A 08/26/2019   Procedure: LUMBAR SPINAL CORD STIMULATOR INSERTION;  Surgeon: Mindi Mt, MD;  Location: [facility] OR;  Service: Neurosurgery;  Laterality: N/A;  LUMBAR SPINAL CORD STIMULATOR INSERTION    SOCIAL HISTORY: Social History   Socioeconomic History   Marital status: Married    Spouse name: [name]   Number of children: 2   Years of education: 13   Highest education level: Not on file  Occupational History   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  Vaping Use   Vaping status: Never Used  Substance and Sexual Activity   Alcohol use: Not Currently   Drug use: No   Sexual activity: Never  Other Topics Concern   Not on file  Social History Narrative   Patient lives with her husband. ([name]) - [place]   She is an adopted kid and so is unaware of her family history.   She has 2 children- one has asthma and other has arthritis.               Social Drivers of Corporate investment banker  Strain: Not on file  Food Insecurity: Low Risk  (07/12/2024)   Received from [facility]   Hunger Vital Sign    Within the past 12 months, you worried that your food would run out before you got money to buy more: Never true    Within the past 12 months, the food you bought just didn't last and you didn't have money to get more. : Never true  Transportation Needs: No Transportation Needs (07/12/2024)   Received from [facility]    In the past 12 months, has lack of reliable transportation kept you from medical appointments, meetings, work or from getting things needed for daily living? : No  Physical Activity: Not on file  Stress: Not on file  Social Connections: Not on file  Intimate Partner Violence: Not on file    FAMILY HISTORY: Family History  Adopted: Yes  Family history unknown: Yes    ALLERGIES:  is allergic to adhesive [tape], amoxicillin, ciprofloxacin, statins, sulfa antibiotics, and sulfamethoxazole.  MEDICATIONS:  Current Outpatient Medications  Medication Sig Dispense Refill   acetaminophen  (TYLENOL ) 500 MG tablet Take 500 mg by mouth 2 (two) times daily as needed for moderate pain or headache.     Alpha-Lipoic Acid 100 MG CAPS Take 100 mg by mouth daily.      azelastine  (ASTELIN ) 137 MCG/SPRAY nasal spray Place 1 spray into both nostrils 2 (two) times daily.      carvedilol  (COREG ) 6.25 MG tablet Take 1 tablet (6.25 mg total) by mouth 2 (two) times daily. 180 tablet 3   cholecalciferol  (VITAMIN D3) 25 MCG (1000 UT) tablet Take 1,000 Units by mouth daily.     Evolocumab (REPATHA SURECLICK) 140 MG/ML SOAJ Inject 140 mg into the skin every 14 (fourteen) days.     fluticasone  furoate-vilanterol (BREO ELLIPTA ) 100-25 MCG/INH AEPB Inhale 1 puff into the lungs daily.     folic acid (FOLVITE) 800 MCG tablet Take 800 mcg by mouth daily.     hydroxychloroquine (PLAQUENIL) 200 MG tablet Take 200 mg by mouth daily.     Krill Oil 500 MG CAPS Take 500 mg by  mouth daily.     levothyroxine  (SYNTHROID , LEVOTHROID) 50 MCG tablet Take 50 mcg by mouth every evening.      LYSINE PO Take 1,000 mg by mouth daily.     Melatonin 5 MG TABS Take 5 mg by mouth at bedtime.      naproxen (NAPROSYN) 500 MG tablet Take 500 mg by mouth 2 (two) times daily with a meal.     omeprazole (PRILOSEC) 40 MG capsule Take 40 mg by mouth every evening.  oxybutynin (DITROPAN-XL) 5 MG 24 hr tablet Take 5 mg by mouth at bedtime.     rOPINIRole  (REQUIP ) 2 MG tablet Take 2 mg by mouth 3 (three) times daily.      temazepam  (RESTORIL ) 30 MG capsule Take 30 mg by mouth at bedtime.     No current facility-administered medications for this visit.     PHYSICAL EXAMINATION: ECOG PERFORMANCE STATUS: 0 - Asymptomatic  There were no vitals filed for this visit.  Physical exam deferred, telephone visit  LABORATORY DATA:  I have reviewed the data as listed Lab Results  Component Value Date   WBC 12.6 (H) 08/09/2024   HGB 9.2 (L) 08/09/2024   HCT 30.6 (L) 08/09/2024   MCV 79.3 (L) 08/09/2024   PLT 412 (H) 08/09/2024     Chemistry      Component Value Date/Time   NA 138 09/29/2023 1554   K 3.8 09/29/2023 1554   CL 105 09/29/2023 1554   CO2 27 09/29/2023 1554   BUN 20 09/29/2023 1554   CREATININE 0.62 09/29/2023 1554      Component Value Date/Time   CALCIUM  9.2 09/29/2023 1554   ALKPHOS 53 09/29/2023 1554   AST 25 09/29/2023 1554   ALT 16 09/29/2023 1554   BILITOT 0.3 09/29/2023 1554       RADIOGRAPHIC STUDIES: I have personally reviewed the radiological images as listed and agreed with the findings in the report. No results found.  All questions were answered. The patient knows to call the clinic with any problems, questions or concerns.  I connected with  [name] on 08/11/24 by a telephone application and verified that I am speaking with the correct person using two identifiers.   I discussed the limitations of evaluation and management by  telemedicine. The patient expressed understanding and agreed to proceed.  Time spent: 20 min Location of provider: office Location of patient: Home     Amber Stalls, MD 08/11/2024 12:05 PM

## 2024-08-12 ENCOUNTER — Telehealth: Payer: Self-pay | Admitting: Pharmacy Technician

## 2024-08-12 NOTE — Telephone Encounter (Signed)
 Auth Submission: NO AUTH NEEDED Site of care: Site of care: [facility] Payer: [employer] ADVT Medication & CPT/J Code(s) submitted: Venofer (Iron Sucrose) J1756 Diagnosis Code: D50.9 Route of submission (phone, fax, portal):  Phone # Fax # Auth type: Buy/Bill PB Units/visits requested: X3 DOSES Reference number:  Approval from: 10.17.2025 to 12.31.25

## 2024-08-16 ENCOUNTER — Telehealth: Payer: Self-pay | Admitting: Hematology and Oncology

## 2024-08-16 NOTE — Telephone Encounter (Signed)
 left vm for pt to calll back and schedule appts from 10/16 telephone visit

## 2024-08-30 ENCOUNTER — Ambulatory Visit (INDEPENDENT_AMBULATORY_CARE_PROVIDER_SITE_OTHER)

## 2024-08-30 VITALS — BP 116/68 | HR 68 | Temp 98.0°F | Resp 20 | Ht 60.0 in | Wt 114.4 lb

## 2024-08-30 DIAGNOSIS — D509 Iron deficiency anemia, unspecified: Secondary | ICD-10-CM

## 2024-08-30 MED ORDER — SODIUM CHLORIDE 0.9 % IV SOLN
300.0000 mg | Freq: Once | INTRAVENOUS | Status: AC
  Administered 2024-08-30: 300 mg via INTRAVENOUS
  Filled 2024-08-30: qty 15

## 2024-08-30 NOTE — Progress Notes (Signed)
 Diagnosis: Iron Deficiency Anemia  Provider:  Praveen Mannam MD  Procedure: IV Infusion  IV Type: Peripheral, IV Location: L Forearm  Venofer (Iron Sucrose), Dose: 300 mg  Infusion Start Time: 1331  Infusion Stop Time: 1539  Post Infusion IV Care: Observation period completed and Peripheral IV Discontinued  Discharge: Condition: Good, Destination: Home . AVS Declined  Performed by:  Leita FORBES Miles, LPN

## 2024-09-06 ENCOUNTER — Ambulatory Visit (INDEPENDENT_AMBULATORY_CARE_PROVIDER_SITE_OTHER)

## 2024-09-06 VITALS — BP 124/72 | HR 100 | Temp 98.0°F | Resp 16 | Ht 60.0 in | Wt 117.6 lb

## 2024-09-06 DIAGNOSIS — D509 Iron deficiency anemia, unspecified: Secondary | ICD-10-CM | POA: Diagnosis not present

## 2024-09-06 MED ORDER — SODIUM CHLORIDE 0.9 % IV SOLN
300.0000 mg | Freq: Once | INTRAVENOUS | Status: AC
  Administered 2024-09-06: 300 mg via INTRAVENOUS
  Filled 2024-09-06: qty 15

## 2024-09-06 NOTE — Progress Notes (Signed)
 Diagnosis: Iron Deficiency Anemia  Provider:  Praveen Mannam MD  Procedure: IV Infusion  IV Type: Peripheral, IV Location: L Forearm  Venofer (Iron Sucrose), Dose: 300 mg  Infusion Start Time: 1341  Infusion Stop Time: 1535  Post Infusion IV Care: Patient declined observation and Peripheral IV Discontinued  Discharge: Condition: Good, Destination: Home . AVS Declined  Performed by:  Yulieth Carrender, RN

## 2024-09-13 ENCOUNTER — Ambulatory Visit (INDEPENDENT_AMBULATORY_CARE_PROVIDER_SITE_OTHER)

## 2024-09-13 VITALS — BP 118/55 | HR 97 | Temp 98.1°F | Resp 18 | Ht 61.0 in | Wt 116.4 lb

## 2024-09-13 DIAGNOSIS — D509 Iron deficiency anemia, unspecified: Secondary | ICD-10-CM | POA: Diagnosis not present

## 2024-09-13 MED ORDER — IRON SUCROSE 300 MG IVPB - SIMPLE MED
300.0000 mg | Freq: Once | Status: AC
  Administered 2024-09-13: 300 mg via INTRAVENOUS
  Filled 2024-09-13: qty 265

## 2024-09-13 NOTE — Progress Notes (Signed)
 Diagnosis: Acute Anemia  Provider:  Lonna Coder MD  Procedure: IV Infusion  IV Type: Peripheral, IV Location: L Antecubital  Venofer (Iron Sucrose), Dose: 300 mg  Infusion Start Time: 1328  Infusion Stop Time: 1517  Post Infusion IV Care: Patient declined observation and Peripheral IV Discontinued  Discharge: Condition: Good, Destination: Home . AVS Declined  Performed by:  Donny Childes, RN

## 2024-11-11 ENCOUNTER — Inpatient Hospital Stay

## 2024-11-11 ENCOUNTER — Encounter: Payer: Self-pay | Admitting: Hematology and Oncology

## 2024-11-11 ENCOUNTER — Inpatient Hospital Stay: Attending: Hematology and Oncology | Admitting: Hematology and Oncology

## 2024-11-11 VITALS — BP 130/70 | HR 110 | Temp 97.8°F | Resp 18 | Ht 61.0 in | Wt 111.8 lb

## 2024-11-11 DIAGNOSIS — D509 Iron deficiency anemia, unspecified: Secondary | ICD-10-CM | POA: Insufficient documentation

## 2024-11-11 DIAGNOSIS — D75839 Thrombocytosis, unspecified: Secondary | ICD-10-CM | POA: Diagnosis present

## 2024-11-11 LAB — CBC WITH DIFFERENTIAL/PLATELET
Abs Immature Granulocytes: 0.1 K/uL — ABNORMAL HIGH (ref 0.00–0.07)
Basophils Absolute: 0.1 K/uL (ref 0.0–0.1)
Basophils Relative: 1 %
Eosinophils Absolute: 0.2 K/uL (ref 0.0–0.5)
Eosinophils Relative: 1 %
HCT: 34.2 % — ABNORMAL LOW (ref 36.0–46.0)
Hemoglobin: 10.8 g/dL — ABNORMAL LOW (ref 12.0–15.0)
Immature Granulocytes: 1 %
Lymphocytes Relative: 10 %
Lymphs Abs: 1.3 K/uL (ref 0.7–4.0)
MCH: 26.9 pg (ref 26.0–34.0)
MCHC: 31.6 g/dL (ref 30.0–36.0)
MCV: 85.3 fL (ref 80.0–100.0)
Monocytes Absolute: 0.8 K/uL (ref 0.1–1.0)
Monocytes Relative: 6 %
Neutro Abs: 10.7 K/uL — ABNORMAL HIGH (ref 1.7–7.7)
Neutrophils Relative %: 81 %
Platelets: 465 K/uL — ABNORMAL HIGH (ref 150–400)
RBC: 4.01 MIL/uL (ref 3.87–5.11)
RDW: 17.1 % — ABNORMAL HIGH (ref 11.5–15.5)
WBC: 13.1 K/uL — ABNORMAL HIGH (ref 4.0–10.5)
nRBC: 0 % (ref 0.0–0.2)

## 2024-11-11 LAB — FERRITIN: Ferritin: 74 ng/mL (ref 11–307)

## 2024-11-11 LAB — IRON AND IRON BINDING CAPACITY (CC-WL,HP ONLY)
Iron: 48 ug/dL (ref 28–170)
Saturation Ratios: 15 % (ref 10.4–31.8)
TIBC: 329 ug/dL (ref 250–450)
UIBC: 281 ug/dL

## 2024-11-11 NOTE — Progress Notes (Signed)
 [facility] CONSULT NOTE  Patient Care Team: Ofilia Lamar CROME, MD as PCP - General (Unknown Physician Specialty) Court Dorn PARAS, MD as PCP - Cardiology (Cardiology)  CHIEF COMPLAINTS/PURPOSE OF CONSULTATION:  Thrombocytosis.  ASSESSMENT & PLAN:   This is a very pleasant 79 year old female patient with persistent thrombocytosis and some metamyelocytes on peripheral blood smear referred to hematology for additional recommendations.  Assessment & Plan Iron  deficiency anemia Chronic iron  deficiency anemia improved with oral and intravenous iron . - Monitored ferritin and iron  studies; will communicate results when available. - Continue oral iron  supplementation, 65 mg every other day as tolerated; - Consider gastroenterology referral if iron  studies do not improve or anemia worsens. - Consider bone marrow biopsy if further diagnostics indicated and she consents, but deferred for now. - Planned follow-up in 6 months if stable, or sooner if clinical status changes.   HISTORY OF PRESENTING ILLNESS:  MEGAN PRESTI 79 y.o. female is here because of thrombocytosis and follow up for IDA  Discussed the use of AI scribe software for clinical note transcription with the patient, who gave verbal consent to proceed.  History of Present Illness COURTNIE BRENES is a 79 year old female with chronic iron  deficiency anemia who presents for hematology follow-up to assess response to iron  therapy and ongoing evaluation of anemia etiology.  She has chronic iron  deficiency anemia with recent improvement in hemoglobin from 9.2 to 10.8 g/dL following a low-dose oral iron  regimen and an IV iron  infusion administered in late 2025. She currently takes one oral iron  tablet daily but experiences gastrointestinal intolerance at higher doses. She is amenable to increasing the oral dose if tolerated. She denies melena, hematochezia, or black stools. Updated ferritin results are pending to further assess iron   stores.  She has experienced a weight loss of approximately 10 pounds, attributed to dietary changes, and is currently about three pounds above her lowest weight due to recent holiday eating. Her diet is limited, primarily consisting of chicken, eggs, and occasional seafood, and she does not consume three regular meals per day. Her primary care physician is aware of her dietary habits and has not expressed concern. There have been no recent changes in her medication regimen aside from ongoing Repatha.  She has a chronic, non-healing wound on her lower extremity, present for several years, with no evidence of vascular insufficiency on recent studies. The wound is slow to heal, with thin skin prone to injury and infection after trauma. No clear etiology for poor healing has been identified despite ongoing wound care. She also has osteoarthritis, primarily affecting her hands, with persistent soreness.  She has a known paraesophageal stricture and dysphagia, previously evaluated by gastroenterology in May 2025 for difficulty swallowing. There have been no recent gastrointestinal symptoms or changes in swallowing.   Rest of the pertinent 10 point ROS reviewed and neg.  MEDICAL HISTORY:  Past Medical History:  Diagnosis Date   Arthritis    Back pain    Cancer (HCC)    Skin   COPD (chronic obstructive pulmonary disease) (HCC)    Dyspnea    Dysrhythmia    bradycardia   Ex-smoker    Smoked 1 PPD x 20 years, quit 1994   GERD (gastroesophageal reflux disease)    Hip pain    Hyperlipidemia    statin intolerant   Hyperthyroidism    Hypotension    Lumbar post-laminectomy syndrome    Lumbar stenosis with neurogenic claudication    LV dysfunction  ejection fraction 40-45% by 2-D echocardiogram   Palpitations    PVCs on Holter monitoring   Restless leg syndrome    Syncope    Wears dentures     SURGICAL HISTORY: Past Surgical History:  Procedure Laterality Date   BACK SURGERY      BLADDER SUSPENSION     ESOPHAGEAL DILATION     X 2   RIGHT ROTATOR CUFF REPAIR     SPINAL CORD STIMULATOR INSERTION N/A 08/26/2019   Procedure: LUMBAR SPINAL CORD STIMULATOR INSERTION;  Surgeon: Mindi Mt, MD;  Location: [facility] OR;  Service: Neurosurgery;  Laterality: N/A;  LUMBAR SPINAL CORD STIMULATOR INSERTION    SOCIAL HISTORY: Social History   Socioeconomic History   Marital status: Married    Spouse name: [name]   Number of children: 2   Years of education: 13   Highest education level: Not on file  Occupational History   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  Vaping Use   Vaping status: Never Used  Substance and Sexual Activity   Alcohol use: Not Currently   Drug use: No   Sexual activity: Never  Other Topics Concern   Not on file  Social History Narrative   Patient lives with her husband. ([name]) - [place]   She is an adopted kid and so is unaware of her family history.   She has 2 children- one has asthma and other has arthritis.               Social Drivers of Health   Tobacco Use: Medium Risk (07/12/2024)   Received from [facility]   Patient History    Smoking Tobacco Use: Never    Smokeless Tobacco Use: Never    Passive Exposure: Current  Financial Resource Strain: Not on file  Food Insecurity: Low Risk (07/12/2024)   Received from [facility]   Epic    Within the past 12 months, you worried that your food would run out before you got money to buy more: Never true    Within the past 12 months, the food you bought just didn't last and you didn't have money to get more. : Never true  Transportation Needs: No Transportation Needs (07/12/2024)   Received from [employer]    In the past 12 months, has lack of reliable transportation kept you from medical appointments, meetings, work or from getting things needed for daily living? : No  Physical Activity: Not on file  Stress: Not on file  Social Connections: Not on file  Intimate Partner Violence: Not on file  Depression (EYV7-0): Not on file  Alcohol Screen: Not on file  Housing: Low Risk (07/12/2024)   Received from [facility]   Epic    What is your living situation today?: I have a steady place to live    Think about the place you live. Do you have problems with any of the following? Choose all that apply:: None/None on this list  Utilities: Low Risk (07/12/2024)   Received from [facility]   Utilities    In the past 12 months has the electric, gas, oil,  or water company threatened to shut off services in your home? : No  Health Literacy: Not on file    FAMILY HISTORY: Family History  Adopted: Yes  Family history unknown: Yes    ALLERGIES:  is allergic to adhesive [tape], amoxicillin, ciprofloxacin, statins, sulfa antibiotics, and sulfamethoxazole.  MEDICATIONS:  Current Outpatient Medications  Medication Sig Dispense Refill   acetaminophen  (TYLENOL ) 500 MG tablet Take 500 mg by mouth 2 (two) times daily as needed for moderate pain or headache.     Alpha-Lipoic Acid 100 MG CAPS Take 100 mg by mouth daily.      azelastine  (ASTELIN ) 137 MCG/SPRAY nasal spray Place 1 spray into both nostrils 2 (two) times daily.      carvedilol  (COREG ) 6.25 MG tablet Take 1 tablet (6.25 mg total) by mouth 2 (two) times daily. 180 tablet 3   cholecalciferol  (VITAMIN D3) 25 MCG (1000 UT) tablet Take 1,000 Units by mouth daily.     Evolocumab (REPATHA SURECLICK) 140 MG/ML SOAJ Inject 140 mg into the skin every 14 (fourteen) days.     fluticasone  furoate-vilanterol (BREO ELLIPTA ) 100-25 MCG/INH AEPB Inhale 1 puff into the lungs daily.     folic acid (FOLVITE) 800 MCG tablet Take 800 mcg by mouth  daily.     hydroxychloroquine (PLAQUENIL) 200 MG tablet Take 200 mg by mouth daily.     Krill Oil 500 MG CAPS Take 500 mg by mouth daily.     levothyroxine  (SYNTHROID , LEVOTHROID) 50 MCG tablet Take 50 mcg by mouth every evening.      LYSINE PO Take 1,000 mg by mouth daily.     Melatonin 5 MG TABS Take 5 mg by mouth at bedtime.      naproxen (NAPROSYN) 500 MG tablet Take 500 mg by mouth 2 (two) times daily with a meal.     omeprazole (PRILOSEC) 40 MG capsule Take 40 mg by mouth every evening.     oxybutynin (DITROPAN-XL) 5 MG 24 hr tablet Take 5 mg by mouth at bedtime.     rOPINIRole  (REQUIP ) 2 MG tablet Take 2 mg by mouth 3 (three) times daily.      temazepam  (RESTORIL ) 30 MG capsule Take 30 mg by mouth at bedtime.     No current facility-administered medications for this visit.     PHYSICAL EXAMINATION: ECOG PERFORMANCE STATUS: 0 - Asymptomatic  Vitals:   11/11/24 1100  BP: 130/70  Pulse: (!) 110  Resp: 18  Temp: 97.8 F (36.6 C)  SpO2: 100%   She has lost some weight. Appears anxious and jittery No cervical or axillary adenopathy CTA bilaterally RRR No LE edema.  LABORATORY DATA:  I have reviewed the data as listed Lab Results  Component Value Date   WBC 13.1 (H) 11/11/2024   HGB 10.8 (L) 11/11/2024   HCT 34.2 (L) 11/11/2024   MCV 85.3 11/11/2024   PLT 465 (H) 11/11/2024     Chemistry      Component Value Date/Time   NA 138 09/29/2023 1554   K 3.8 09/29/2023 1554   CL 105 09/29/2023 1554   CO2 27 09/29/2023 1554   BUN 20 09/29/2023 1554   CREATININE 0.62 09/29/2023 1554      Component Value Date/Time   CALCIUM  9.2 09/29/2023 1554   ALKPHOS 53 09/29/2023 1554   AST 25 09/29/2023 1554   ALT 16 09/29/2023 1554   BILITOT 0.3 09/29/2023 1554       RADIOGRAPHIC STUDIES: I have personally reviewed the radiological  images as listed and agreed with the findings in the report. No results found.  All questions were answered. The patient knows to call the  clinic with any problems, questions or concerns     Amber Stalls, MD 11/11/2024 12:13 PM

## 2024-11-11 NOTE — Progress Notes (Signed)
 [facility] CONSULT NOTE  Patient Care Team: Ofilia Lamar CROME, MD as PCP - General (Unknown Physician Specialty) Court Dorn PARAS, MD as PCP - Cardiology (Cardiology)  CHIEF COMPLAINTS/PURPOSE OF CONSULTATION:  Thrombocytosis.  ASSESSMENT & PLAN:   This is a very pleasant 79 year old female patient with persistent thrombocytosis and some metamyelocytes on peripheral blood smear referred to hematology for additional recommendations.  HISTORY OF PRESENTING ILLNESS:  [name] 79 y.o. female is here because of thrombocytosis and follow up for IDA  She is here for a telephone visit. She denies any new health complaints today. She says she has been taking ferrous sulfate 65 mg daily. Husband was on the phone as well, he inquired into insurance coverage for IV iron  and if that would be a better way to deal with the IDA. She has noted some gastric upset with iron .  Rest of the pertinent 10 point ROS reviewed and neg.  MEDICAL HISTORY:  Past Medical History:  Diagnosis Date   Arthritis    Back pain    Cancer (HCC)    Skin   COPD (chronic obstructive pulmonary disease) (HCC)    Dyspnea    Dysrhythmia    bradycardia   Ex-smoker    Smoked 1 PPD x 20 years, quit 1994   GERD (gastroesophageal reflux disease)    Hip pain    Hyperlipidemia    statin intolerant   Hyperthyroidism    Hypotension    Lumbar post-laminectomy syndrome    Lumbar stenosis with neurogenic claudication    LV dysfunction    ejection fraction 40-45% by 2-D echocardiogram   Palpitations    PVCs on Holter monitoring   Restless leg syndrome    Syncope    Wears dentures     SURGICAL HISTORY: Past Surgical History:  Procedure Laterality Date   BACK SURGERY     BLADDER SUSPENSION     ESOPHAGEAL DILATION     X 2   RIGHT ROTATOR CUFF REPAIR     SPINAL CORD STIMULATOR INSERTION N/A 08/26/2019   Procedure: LUMBAR SPINAL CORD STIMULATOR INSERTION;  Surgeon: Mindi Mt, MD;  Location: [facility] OR;   Service: Neurosurgery;  Laterality: N/A;  LUMBAR SPINAL CORD STIMULATOR INSERTION    SOCIAL HISTORY: Social History   Socioeconomic History   Marital status: Married    Spouse name: [name]   Number of children: 2   Years of education: 13   Highest education level: Not on file  Occupational History   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  Vaping Use   Vaping status: Never Used  Substance and Sexual Activity   Alcohol use: Not Currently   Drug use: No   Sexual activity: Never  Other Topics Concern   Not on file  Social History Narrative   Patient lives with her husband. ([name]) - [place]   She is an adopted kid and so is unaware of her family history.   She has 2 children- one has asthma and other has arthritis.               Social Drivers of Health   Tobacco Use:  Medium Risk (07/12/2024)   Received from [facility]   Patient History    Smoking Tobacco Use: Never    Smokeless Tobacco Use: Never    Passive Exposure: Current  Financial Resource Strain: Not on file  Food Insecurity: Low Risk (07/12/2024)   Received from [facility]   Epic    Within the past 12 months, you worried that your food would run out before you got money to buy more: Never true    Within the past 12 months, the food you bought just didn't last and you didn't have money to get more. : Never true  Transportation Needs: No Transportation Needs (07/12/2024)   Received from [facility]    In the past 12 months, has lack of reliable transportation kept you from medical appointments, meetings, work or from getting things needed for daily living? : No  Physical Activity: Not on file  Stress: Not on file  Social  Connections: Not on file  Intimate Partner Violence: Not on file  Depression (EYV7-0): Not on file  Alcohol Screen: Not on file  Housing: Low Risk (07/12/2024)   Received from [facility]   Epic    What is your living situation today?: I have a steady place to live    Think about the place you live. Do you have problems with any of the following? Choose all that apply:: None/None on this list  Utilities: Low Risk (07/12/2024)   Received from [facility]   Utilities    In the past 12 months has the electric, gas, oil, or water company threatened to shut off services in your home? : No  Health Literacy: Not on file    FAMILY HISTORY: Family History  Adopted: Yes  Family history unknown: Yes    ALLERGIES:  is allergic to adhesive [tape], amoxicillin, ciprofloxacin, statins, sulfa antibiotics, and sulfamethoxazole.  MEDICATIONS:  Current Outpatient Medications  Medication Sig Dispense Refill   acetaminophen  (TYLENOL ) 500 MG tablet Take 500 mg by mouth 2 (two) times daily as needed for moderate pain or headache.     Alpha-Lipoic Acid 100 MG CAPS Take 100 mg by mouth daily.      azelastine  (ASTELIN ) 137 MCG/SPRAY nasal spray Place 1 spray into both nostrils 2 (two) times daily.      carvedilol  (COREG ) 6.25 MG tablet Take 1 tablet (6.25 mg total) by mouth 2 (two) times daily. 180 tablet 3   cholecalciferol  (VITAMIN D3) 25 MCG (1000 UT) tablet Take 1,000 Units by mouth daily.     Evolocumab (REPATHA SURECLICK) 140 MG/ML SOAJ Inject 140 mg into the skin every 14 (fourteen) days.     fluticasone  furoate-vilanterol (BREO ELLIPTA ) 100-25 MCG/INH AEPB Inhale 1 puff into the lungs daily.     folic acid (FOLVITE) 800 MCG tablet Take 800 mcg by mouth daily.     hydroxychloroquine (PLAQUENIL) 200 MG tablet Take 200 mg by mouth daily.     Krill Oil 500 MG CAPS Take 500 mg by mouth daily.     levothyroxine  (SYNTHROID , LEVOTHROID) 50 MCG tablet Take 50 mcg by mouth every evening.      LYSINE PO  Take 1,000 mg by mouth daily.     Melatonin 5 MG TABS Take 5 mg by mouth at bedtime.      naproxen (NAPROSYN) 500 MG tablet Take 500 mg by mouth 2 (two) times daily with a meal.     omeprazole (PRILOSEC) 40 MG capsule Take 40 mg by mouth  every evening.     oxybutynin (DITROPAN-XL) 5 MG 24 hr tablet Take 5 mg by mouth at bedtime.     rOPINIRole  (REQUIP ) 2 MG tablet Take 2 mg by mouth 3 (three) times daily.      temazepam  (RESTORIL ) 30 MG capsule Take 30 mg by mouth at bedtime.     No current facility-administered medications for this visit.     PHYSICAL EXAMINATION: ECOG PERFORMANCE STATUS: 0 - Asymptomatic  Vitals:   11/11/24 1100  BP: 130/70  Pulse: (!) 110  Resp: 18  Temp: 97.8 F (36.6 C)  SpO2: 100%    Physical exam deferred, telephone visit  LABORATORY DATA:  I have reviewed the data as listed Lab Results  Component Value Date   WBC 13.1 (H) 11/11/2024   HGB 10.8 (L) 11/11/2024   HCT 34.2 (L) 11/11/2024   MCV 85.3 11/11/2024   PLT 465 (H) 11/11/2024     Chemistry      Component Value Date/Time   NA 138 09/29/2023 1554   K 3.8 09/29/2023 1554   CL 105 09/29/2023 1554   CO2 27 09/29/2023 1554   BUN 20 09/29/2023 1554   CREATININE 0.62 09/29/2023 1554      Component Value Date/Time   CALCIUM  9.2 09/29/2023 1554   ALKPHOS 53 09/29/2023 1554   AST 25 09/29/2023 1554   ALT 16 09/29/2023 1554   BILITOT 0.3 09/29/2023 1554       RADIOGRAPHIC STUDIES: I have personally reviewed the radiological images as listed and agreed with the findings in the report. No results found.  All questions were answered. The patient knows to call the clinic with any problems, questions or concerns.      Amber Stalls, MD 11/11/2024 12:04 PM

## 2024-11-18 LAB — BCR-ABL1 FISH
Cells Analyzed: 200
Cells Counted: 200

## 2024-11-22 LAB — NGS JAK2 E12-15/CALR/MPL
